# Patient Record
Sex: Male | Born: 1965
Health system: Southern US, Community
[De-identification: ages and names within clinical notes are randomized; demographics above are authoritative.]

## PROBLEM LIST (undated history)

## (undated) DIAGNOSIS — R140 Abdominal distension (gaseous): Secondary | ICD-10-CM

## (undated) DIAGNOSIS — Z950 Presence of cardiac pacemaker: Secondary | ICD-10-CM

## (undated) DIAGNOSIS — M25471 Effusion, right ankle: Secondary | ICD-10-CM

## (undated) DIAGNOSIS — Z95810 Presence of automatic (implantable) cardiac defibrillator: Secondary | ICD-10-CM

## (undated) DIAGNOSIS — I509 Heart failure, unspecified: Secondary | ICD-10-CM

## (undated) DIAGNOSIS — Z9289 Personal history of other medical treatment: Secondary | ICD-10-CM

## (undated) DIAGNOSIS — M25472 Effusion, left ankle: Secondary | ICD-10-CM

## (undated) DIAGNOSIS — K7460 Unspecified cirrhosis of liver: Secondary | ICD-10-CM

## (undated) DIAGNOSIS — I428 Other cardiomyopathies: Secondary | ICD-10-CM

## (undated) HISTORY — DX: Other cardiomyopathies: I42.8

## (undated) HISTORY — PX: APPENDECTOMY: SHX54

## (undated) HISTORY — PX: HEART TRANSPLANT: SHX268

## (undated) HISTORY — DX: Personal history of other medical treatment: Z92.89

## (undated) HISTORY — DX: Presence of automatic (implantable) cardiac defibrillator: Z95.810

## (undated) HISTORY — PX: OTHER SURGICAL HISTORY: SHX169

## (undated) HISTORY — PX: CHOLECYSTECTOMY: SHX55

---

## 2000-04-09 HISTORY — PX: CARDIAC CATHETERIZATION: SHX172

## 2000-05-15 ENCOUNTER — Inpatient Hospital Stay (HOSPITAL_COMMUNITY): Admission: EM | Admit: 2000-05-15 | Discharge: 2000-05-24 | Payer: Self-pay | Admitting: *Deleted

## 2000-05-15 ENCOUNTER — Encounter: Payer: Self-pay | Admitting: Cardiovascular Disease

## 2000-05-17 ENCOUNTER — Encounter: Payer: Self-pay | Admitting: Internal Medicine

## 2000-05-19 ENCOUNTER — Encounter: Payer: Self-pay | Admitting: Cardiovascular Disease

## 2000-05-20 ENCOUNTER — Encounter: Payer: Self-pay | Admitting: Cardiovascular Disease

## 2007-08-26 DIAGNOSIS — Z95810 Presence of automatic (implantable) cardiac defibrillator: Secondary | ICD-10-CM

## 2007-08-26 HISTORY — PX: PACEMAKER INSERTION: SHX728

## 2007-08-26 HISTORY — DX: Presence of automatic (implantable) cardiac defibrillator: Z95.810

## 2012-01-26 ENCOUNTER — Encounter (HOSPITAL_COMMUNITY): Payer: Self-pay

## 2012-01-26 ENCOUNTER — Emergency Department (HOSPITAL_COMMUNITY)
Admission: EM | Admit: 2012-01-26 | Discharge: 2012-01-26 | Disposition: A | Discharge status: Home / Self Care | Payer: BC Managed Care – PPO | Attending: Emergency Medicine | Admitting: Emergency Medicine

## 2012-01-26 DIAGNOSIS — M79609 Pain in unspecified limb: Secondary | ICD-10-CM | POA: Insufficient documentation

## 2012-01-26 DIAGNOSIS — M549 Dorsalgia, unspecified: Secondary | ICD-10-CM | POA: Insufficient documentation

## 2012-01-26 DIAGNOSIS — M25529 Pain in unspecified elbow: Secondary | ICD-10-CM | POA: Insufficient documentation

## 2012-01-26 DIAGNOSIS — M25519 Pain in unspecified shoulder: Secondary | ICD-10-CM | POA: Insufficient documentation

## 2012-01-26 DIAGNOSIS — M79639 Pain in unspecified forearm: Secondary | ICD-10-CM

## 2012-01-26 DIAGNOSIS — M542 Cervicalgia: Secondary | ICD-10-CM | POA: Insufficient documentation

## 2012-01-26 DIAGNOSIS — M25511 Pain in right shoulder: Secondary | ICD-10-CM

## 2012-01-26 MED ORDER — PREDNISONE 10 MG PO TABS: ORAL_TABLET | ORAL | Status: DC

## 2012-01-26 MED ORDER — HYDROCODONE-ACETAMINOPHEN 7.5-325 MG PO TABS: 1.0000 | ORAL_TABLET | ORAL | Status: DC | PRN

## 2012-01-26 MED ORDER — HYDROCODONE-ACETAMINOPHEN 5-325 MG PO TABS
2.0000 | ORAL_TABLET | Freq: Once | ORAL | Status: AC
  Administered 2012-01-26: 2 via ORAL
  Filled 2012-01-26: qty 2

## 2012-01-26 MED ORDER — DIAZEPAM 5 MG PO TABS
10.0000 mg | ORAL_TABLET | Freq: Once | ORAL | Status: AC
  Administered 2012-01-26: 10 mg via ORAL
  Filled 2012-01-26: qty 2

## 2012-01-26 MED ORDER — PREDNISONE 20 MG PO TABS
60.0000 mg | ORAL_TABLET | Freq: Once | ORAL | Status: AC
  Administered 2012-01-26: 60 mg via ORAL
  Filled 2012-01-26: qty 3

## 2012-01-26 MED ORDER — ONDANSETRON HCL 4 MG PO TABS
4.0000 mg | ORAL_TABLET | Freq: Once | ORAL | Status: AC
  Administered 2012-01-26: 4 mg via ORAL
  Filled 2012-01-26: qty 1

## 2012-01-26 MED ORDER — DIAZEPAM 5 MG PO TABS: ORAL_TABLET | ORAL | Status: DC

## 2012-01-26 NOTE — ED Notes (Signed)
Pt reports having neck/back pain x4 days, more severe now has been seeing local chiropractor for treatment.  Did not see any improvement in pain. Toothache like pain in right shoulder /arm, feels pain is worse when sitting or standing.

## 2012-01-26 NOTE — ED Provider Notes (Signed)
History     CSN: [id number]  Arrival date & time 01/26/12  63   First MD Initiated Contact with Patient 01/26/12 1641      Chief Complaint  Patient presents with  . Neck Pain  . Back Pain    (Consider location/radiation/quality/duration/timing/severity/associated sxs/prior treatment) HPI Comments: Patient states he has had problems with pain in his neck for the last 4 days. It is becoming more and more severe radiating into his right shoulder. It is also of note that over last 3-4 months the patient is been having problems with pain in his forearm on the right. He was told by an orthopedic surgeon that he needed to have surgery on one of the nerves in his wrist. The patient has been seeing a chiropractic physician recently he has had 3-4 days of therapy but is not getting any better and actually feels that he is getting worse. The patient presents at this time for assistance with his pain and for evaluation of his neck back and forearm.  Patient is a 46 y.o. male presenting with neck pain and back pain. The history is provided by the patient.  Neck Pain  This is a chronic problem. The problem occurs daily. The problem has been gradually worsening. The pain is associated with an unknown factor. There has been no fever. The pain is present in the generalized neck. The quality of the pain is described as shooting and aching. Radiates to: lower back. The pain is severe. The symptoms are aggravated by position. The pain is the same all the time. Pertinent negatives include no photophobia, no chest pain, no numbness, no bowel incontinence and no bladder incontinence. He has tried nothing for the symptoms.  Back Pain  Pertinent negatives include no chest pain, no numbness, no abdominal pain, no bowel incontinence, no bladder incontinence and no dysuria.    History reviewed. No pertinent past medical history.  Past Surgical History  Procedure Date  . Appendectomy   . Pacemaker insertion      No family history on file.  History  Substance Use Topics  . Smoking status: Never Smoker   . Smokeless tobacco: Not on file  . Alcohol Use: Yes      Review of Systems  Constitutional: Negative for activity change.       All ROS Neg except as noted in HPI  HENT: Positive for neck pain. Negative for nosebleeds.   Eyes: Negative for photophobia and discharge.  Respiratory: Negative for cough, shortness of breath and wheezing.   Cardiovascular: Negative for chest pain and palpitations.  Gastrointestinal: Negative for abdominal pain, blood in stool and bowel incontinence.  Genitourinary: Negative for bladder incontinence, dysuria, frequency and hematuria.  Musculoskeletal: Positive for back pain. Negative for arthralgias.  Skin: Negative.   Neurological: Negative for dizziness, seizures, speech difficulty and numbness.  Psychiatric/Behavioral: Negative for hallucinations and confusion.    Allergies  Review of patient's allergies indicates no known allergies.  Home Medications  No current outpatient prescriptions on file.  BP 119/87  Pulse 76  Temp 97.8 F (36.6 C) (Oral)  Resp 20  Ht 5\' 11"  (1.803 m)  Wt 208 lb (94.348 kg)  BMI 29.01 kg/m2  SpO2 100%  Physical Exam  Nursing note and vitals reviewed. Constitutional: He is oriented to person, place, and time. He appears well-developed and well-nourished.  Non-toxic appearance.  HENT:  Head: Normocephalic.  Right Ear: Tympanic membrane and external ear normal.  Left Ear: Tympanic membrane and external ear  normal.  Eyes: EOM and lids are normal. Pupils are equal, round, and reactive to light.  Neck: Normal range of motion. Neck supple. Carotid bruit is not present.       No carotid bruits appreciated.  Cardiovascular: Normal rate, regular rhythm, normal heart sounds, intact distal pulses and normal pulses.   Pulmonary/Chest: Breath sounds normal. No respiratory distress.  Abdominal: Soft. Bowel sounds are normal.  There is no tenderness. There is no guarding.  Musculoskeletal: Normal range of motion.       There is pain at the superior portion of the trapezius with attempted range of motion of the right shoulder. There is pain with resistance to abduction and abduction of the right shoulder.  There is pain to palpation of the right elbow and extensor tendon area on the right. There no hot areas. There is no palpable deformity of the forearm or elbow.  Lymphadenopathy:       Head (right side): No submandibular adenopathy present.       Head (left side): No submandibular adenopathy present.    He has no cervical adenopathy.  Neurological: He is alert and oriented to person, place, and time. He has normal strength. No cranial nerve deficit or sensory deficit.       Grip is symmetrical. Gait is steady.  Skin: Skin is warm and dry.  Psychiatric: He has a normal mood and affect. His speech is normal.    ED Course  Procedures (including critical care time)  Labs Reviewed - No data to display No results found.   No diagnosis found.    MDM  I have reviewed nursing notes, vital signs, and all appropriate lab and imaging results for this patient. Patient is scheduled to see a [facility] surgeon on Monday, October 21 for evaluation concerning the neck shoulder and upper back area pain. He will also discussed the pain in the right forearm. The patient presents tonight to get assistance with his pain until he can see the physician on Monday. No gross neurologic deficits appreciated at this time. Vital signs reviewed and are stable. The patient is treated with Valium 3 times daily for spasm, Norco 7.5 mg one every 4 hours if needed for pain. Patient is also treated with prednisone 10 mg taper. Patient and the patient's spouse invited to return to the emergency department if any changes, problems, or concerns.       Lenox Ahr, [place] 01/26/12 1727

## 2012-01-27 NOTE — ED Provider Notes (Signed)
Medical screening examination/treatment/procedure(s) were performed by non-physician practitioner and as supervising physician I was immediately available for consultation/collaboration. Rolland Porter, MD, FACEP   Janice Norrie, MD 01/27/12 [phone]

## 2012-03-05 ENCOUNTER — Emergency Department (HOSPITAL_COMMUNITY): Payer: BC Managed Care – PPO

## 2012-03-05 ENCOUNTER — Emergency Department (HOSPITAL_COMMUNITY)
Admission: EM | Admit: 2012-03-05 | Discharge: 2012-03-05 | Disposition: A | Discharge status: Home / Self Care | Payer: BC Managed Care – PPO | Attending: Emergency Medicine | Admitting: Emergency Medicine

## 2012-03-05 ENCOUNTER — Encounter (HOSPITAL_COMMUNITY): Payer: Self-pay | Admitting: Emergency Medicine

## 2012-03-05 DIAGNOSIS — K5900 Constipation, unspecified: Secondary | ICD-10-CM

## 2012-03-05 DIAGNOSIS — N200 Calculus of kidney: Secondary | ICD-10-CM | POA: Insufficient documentation

## 2012-03-05 DIAGNOSIS — Z79899 Other long term (current) drug therapy: Secondary | ICD-10-CM | POA: Insufficient documentation

## 2012-03-05 DIAGNOSIS — Z7982 Long term (current) use of aspirin: Secondary | ICD-10-CM | POA: Insufficient documentation

## 2012-03-05 DIAGNOSIS — Z9089 Acquired absence of other organs: Secondary | ICD-10-CM | POA: Insufficient documentation

## 2012-03-05 MED ORDER — KETOROLAC TROMETHAMINE 30 MG/ML IJ SOLN
30.0000 mg | Freq: Once | INTRAMUSCULAR | Status: AC
  Administered 2012-03-05: 30 mg via INTRAVENOUS
  Filled 2012-03-05: qty 1

## 2012-03-05 NOTE — ED Notes (Signed)
Bed:WA11<BR> Expected date:<BR> Expected time:<BR> Means of arrival:<BR> Comments:<BR>

## 2012-03-05 NOTE — ED Notes (Signed)
Per [facility]-pt being transferred from [facility] with c/o of right flank pain. Hx of kidney problems. States that MD at [facility] told him he had a kidney stone. Toradol given before transport. NAD at this time.

## 2012-03-05 NOTE — ED Notes (Signed)
Pt states that he was given an enema before he left [facility] facility, bedside commode in room.

## 2012-03-05 NOTE — ED Provider Notes (Signed)
History     CSN: [id number]  Arrival date & time 03/05/12  [id number]   First MD Initiated Contact with Patient 03/05/12 1906      Chief Complaint  Patient presents with  . Flank Pain    (Consider location/radiation/quality/duration/timing/severity/associated sxs/prior treatment) HPI  45 y.o. Male with dilated cardiomyopathy with ef of 25% with a kidney stone diagnosed at [facility] on Saturday.  Patient followed up with urologist for left 5 cm upj stone which reportedly had not moved since Saturday.  Patient sent to ed at [facility] and given toradol and an enema as he had not had a bowel movement since taking pain meds.  Patient transferred here to be seen by urology.  Patient had large bowel movement after arriving here.  Pain resolved at [facility] after toradol and bowel movement.    History reviewed. No pertinent past medical history.  Past Surgical History  Procedure Date  . Appendectomy   . Pacemaker insertion     No family history on file.  History  Substance Use Topics  . Smoking status: Never Smoker   . Smokeless tobacco: Not on file  . Alcohol Use: Yes      Review of Systems  Constitutional: Negative for fever and chills.  HENT: Negative for neck stiffness.   Eyes: Negative for visual disturbance.  Respiratory: Negative for shortness of breath.   Cardiovascular: Negative for chest pain.  Gastrointestinal: Positive for constipation. Negative for vomiting, diarrhea and blood in stool.  Genitourinary: Positive for flank pain. Negative for dysuria, frequency and decreased urine volume.  Musculoskeletal: Negative for myalgias and joint swelling.  Skin: Negative for rash.  Neurological: Negative for weakness.  Hematological: Negative for adenopathy.  Psychiatric/Behavioral: Negative for agitation.    Allergies  Penicillins and Sulfa antibiotics  Home Medications   Current Outpatient Rx  Name  Route  Sig  Dispense  Refill  . ASPIRIN EC 81 MG PO TBEC   Oral  Take 81 mg by mouth daily.         Marland Kitchen CARVEDILOL 12.5 MG PO TABS   Oral   Take 12.5 mg by mouth 2 (two) times daily with a meal.         . CETIRIZINE HCL 10 MG PO TABS   Oral   Take 10 mg by mouth daily.         . COQ-10 200 MG PO CAPS   Oral   Take 1 capsule by mouth daily.         Marland Kitchen DIGOXIN 0.125 MG PO TABS   Oral   Take 0.125 mg by mouth daily.         . OMEGA-3 FATTY ACIDS 1000 MG PO CAPS   Oral   Take 1-2 g by mouth daily. 2 morning, 1 at night         . FLUTICASONE-SALMETEROL 100-50 MCG/DOSE IN AEPB   Inhalation   Inhale 1 puff into the lungs every 12 (twelve) hours.         . FUROSEMIDE 20 MG PO TABS   Oral   Take 20 mg by mouth daily.         Marland Kitchen LOSARTAN POTASSIUM 50 MG PO TABS   Oral   Take 50 mg by mouth daily.         Marland Kitchen OMEPRAZOLE 20 MG PO CPDR   Oral   Take 20 mg by mouth daily.         Marland Kitchen ZAFIRLUKAST 20 MG PO TABS  Oral   Take 20 mg by mouth at bedtime.           BP 121/75  Pulse 79  Temp 99.7 F (37.6 C) (Oral)  Resp 20  SpO2 100%  Physical Exam  Nursing note and vitals reviewed. Constitutional: He is oriented to person, place, and time. He appears well-developed and well-nourished.  HENT:  Head: Normocephalic and atraumatic.  Right Ear: External ear normal.  Left Ear: External ear normal.  Nose: Nose normal.  Mouth/Throat: Oropharynx is clear and moist.  Eyes: Conjunctivae normal and EOM are normal. Pupils are equal, round, and reactive to light.  Neck: Normal range of motion. Neck supple.  Cardiovascular: Normal rate, regular rhythm, normal heart sounds and intact distal pulses.   Pulmonary/Chest: Effort normal and breath sounds normal. No respiratory distress. He has no wheezes. He exhibits no tenderness.  Abdominal: Soft. Bowel sounds are normal. He exhibits no distension and no mass. There is no tenderness. There is no guarding.  Musculoskeletal: Normal range of motion.  Neurological: He is alert and oriented to  person, place, and time. He has normal reflexes. He exhibits normal muscle tone. Coordination normal.  Skin: Skin is warm and dry.  Psychiatric: He has a normal mood and affect. His behavior is normal. Judgment and thought content normal.    ED Course  Procedures (including critical care time)  Labs Reviewed - No data to display No results found.   No diagnosis found.    MDM  Plan repeat kub as patient now pain free Discussed with Dr. Alinda Money and he does not think patient would be candidate for intervention if pain free.  Patient continues pain free but 5 mm stone at right upj without movement.  Patient to be discharged with instructions to return if pain not controlled and to have bowel management done proactively.  He and his wife voice understanding of plan.        Shaune Pollack, MD 03/05/12 2209

## 2012-09-02 ENCOUNTER — Other Ambulatory Visit: Payer: Self-pay | Admitting: *Deleted

## 2012-09-02 MED ORDER — CARVEDILOL 12.5 MG PO TABS: 12.5000 mg | ORAL_TABLET | Freq: Two times a day (BID) | ORAL | Status: DC

## 2012-09-02 NOTE — Telephone Encounter (Signed)
RX printed out for Dr Rollene Fare

## 2012-09-04 ENCOUNTER — Telehealth: Payer: Self-pay | Admitting: Cardiovascular Disease

## 2012-09-04 MED ORDER — CARVEDILOL 12.5 MG PO TABS: 12.5000 mg | ORAL_TABLET | Freq: Two times a day (BID) | ORAL | Status: DC

## 2012-09-04 NOTE — Telephone Encounter (Signed)
Mr. Fortuno is calling because he said that [facility] has faxed over a request to get his medication refilled and it has been a week and they have not heard anything as of yet....its for his Carvedilol.12.5mg  tablets.Suzie Portela.[id number].Marland Kitchen And he has no more at this time    Thanks

## 2012-09-04 NOTE — Telephone Encounter (Signed)
Refills resent to pharmacy

## 2012-09-25 ENCOUNTER — Encounter: Payer: Self-pay | Admitting: Cardiovascular Disease

## 2012-09-25 ENCOUNTER — Other Ambulatory Visit (HOSPITAL_COMMUNITY): Payer: Self-pay | Admitting: Cardiovascular Disease

## 2012-09-25 DIAGNOSIS — I255 Ischemic cardiomyopathy: Secondary | ICD-10-CM

## 2012-10-16 ENCOUNTER — Ambulatory Visit (HOSPITAL_COMMUNITY): Payer: Medicare Other | Attending: Cardiovascular Disease

## 2012-10-20 ENCOUNTER — Other Ambulatory Visit: Payer: Self-pay | Admitting: *Deleted

## 2012-10-20 DIAGNOSIS — I428 Other cardiomyopathies: Secondary | ICD-10-CM

## 2012-10-24 DIAGNOSIS — I428 Other cardiomyopathies: Secondary | ICD-10-CM

## 2012-11-18 ENCOUNTER — Encounter: Payer: Self-pay | Admitting: *Deleted

## 2013-01-12 ENCOUNTER — Ambulatory Visit: Payer: BC Managed Care – PPO | Admitting: Cardiovascular Disease

## 2013-02-09 ENCOUNTER — Ambulatory Visit: Payer: BC Managed Care – PPO | Admitting: Cardiovascular Disease

## 2013-02-20 ENCOUNTER — Encounter: Payer: Self-pay | Admitting: Cardiovascular Disease

## 2013-02-20 ENCOUNTER — Ambulatory Visit (INDEPENDENT_AMBULATORY_CARE_PROVIDER_SITE_OTHER): Payer: Medicare Other | Admitting: Cardiovascular Disease

## 2013-02-20 VITALS — BP 124/82 | HR 59 | Ht 71.0 in | Wt 208.0 lb

## 2013-02-20 DIAGNOSIS — I428 Other cardiomyopathies: Secondary | ICD-10-CM

## 2013-02-20 NOTE — Progress Notes (Signed)
02/20/2013 Patrick Humphrey   January 07, 1966  [id number]  Primary Physician No primary provider on file. Primary Cardiologist: Lorretta Harp MD Renae Gloss   HPI: The patient is a delightful, 47 year old, mildly overweight, married, Caucasian male, father of 2, grandfather to 2 grandchildren, who has a history of a nonischemic cardiomyopathy by myself by cath in 2002. 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 He underwent ICD placement by Dr. Tomie China back in 2009 for primary prevention, that was followed by Dr. Elisabeth Cara here in our office until his departure, and currently by Dr. Rollene Fare. He has had 25 episodes of what appears to be an SVT of unclear etiology. Because it was a unipolar ventricular lead, it is unclear whether this was sinus tachycardia versus SVT. The rates were in the 175 to 188 range, and they were all at a minute or less. It is hard to determine whether these were exercise related or not. He also had several episodes of nonsustained VT, the longest one being 12 beats, which self terminated. He was completely unaware of any of these episodes.     Current Outpatient Prescriptions  Medication Sig Dispense Refill  . aspirin EC 81 MG tablet Take 81 mg by mouth daily.      . carvedilol (COREG) 12.5 MG tablet Take 12.5 mg by mouth. One tablet in the morning and 1/2 tablet at night      . cetirizine (ZYRTEC) 10 MG tablet Take 10 mg by mouth daily.      . Coenzyme Q10 (COQ-10) 200 MG CAPS Take 1 capsule by mouth daily.      . digoxin (LANOXIN) 0.125 MG tablet Take 0.125 mg by mouth daily.      . fish oil-omega-3 fatty acids 1000 MG capsule Take 1-2 g by mouth daily. 2 morning, 1 at night      . Fluticasone-Salmeterol (ADVAIR) 100-50 MCG/DOSE AEPB Inhale 1 puff into the lungs every 12 (twelve) hours.      . furosemide (LASIX) 20 MG tablet Take 20 mg by mouth daily.      Marland Kitchen losartan (COZAAR) 50 MG tablet Take 50 mg by mouth daily.      .  montelukast (SINGULAIR) 10 MG tablet Take 10 mg by mouth at bedtime.      Marland Kitchen omeprazole (PRILOSEC) 20 MG capsule Take 20 mg by mouth daily.       No current facility-administered medications for this visit.    Allergies  Allergen Reactions  . Penicillins   . Sulfa Antibiotics     History   Social History  . Marital Status: Married    Spouse Name: N/A    Number of Children: N/A  . Years of Education: N/A   Occupational History  . Not on file.   Social History Main Topics  . Smoking status: Former Smoker    Quit date: 02/20/1998  . Smokeless tobacco: Not on file  . Alcohol Use: Yes  . Drug Use: No  . Sexual Activity: Yes   Other Topics Concern  . Not on file   Social History Narrative  . No narrative on file     Review of Systems: General: negative for chills, fever, night sweats or weight changes.  Cardiovascular: negative for chest pain, dyspnea on exertion, edema, orthopnea, palpitations, paroxysmal nocturnal dyspnea or shortness of breath Dermatological: negative for rash Respiratory: negative for cough or wheezing Urologic: negative for hematuria Abdominal: negative for nausea, vomiting, diarrhea,  bright red blood per rectum, melena, or hematemesis Neurologic: negative for visual changes, syncope, or dizziness All other systems reviewed and are otherwise negative except as noted above.    Blood pressure 124/82, pulse 59, height 5\' 11"  (1.803 m), weight 208 lb (94.348 kg).  General appearance: alert and no distress Neck: no adenopathy, no carotid bruit, no JVD, supple, symmetrical, trachea midline and thyroid not enlarged, symmetric, no tenderness/mass/nodules Lungs: clear to auscultation bilaterally Heart: regular rate and rhythm, S1, S2 normal, no murmur, click, rub or gallop Extremities: extremities normal, atraumatic, no cyanosis or edema  EKG sinus bradycardia at 59 with nonspecific ST and T-wave changes  ASSESSMENT AND PLAN:   Nonischemic  cardiomyopathy The patient has a long history of nonischemic myopathy dating back to 2002 when I performed cardiac catheterization on him revealing severe LV dysfunction with normal coronary arteries. He had an ICD placed by Dr. Tomie China in 2009 for primary prevention which has since been followed by Dr. Rollene Fare. He has had no discharges that he is aware of. He denies chest pain or shortness of breath.      Lorretta Harp MD FACP,FACC,FAHA, [facility] 02/20/2013 4:00 PM

## 2013-02-20 NOTE — Assessment & Plan Note (Signed)
The patient has a long history of nonischemic myopathy dating back to 2002 when I performed cardiac catheterization on him revealing severe LV dysfunction with normal coronary arteries. He had an ICD placed by Dr. Tomie China in 2009 for primary prevention which has since been followed by Dr. Rollene Fare. He has had no discharges that he is aware of. He denies chest pain or shortness of breath.

## 2013-02-20 NOTE — Patient Instructions (Signed)
  Your physician wants you to follow-up with him in : 1 year with Dr Gwenlyn Found                                            and with Dr Sallyanne Kuster in June 2015                    You will receive a reminder letter in the mail one month in advance. If you don't receive a letter, please call our office to schedule the follow-up appointment.     Your physician has ordered the following tests: echocardiogram

## 2013-03-20 ENCOUNTER — Telehealth: Payer: Self-pay | Admitting: *Deleted

## 2013-03-20 NOTE — Telephone Encounter (Signed)
Dr Gwenlyn Found reviewed echocardiogram from [facility] that was done in July 2014.  We do not need to repeat an echocardiogram at this time.  Dr Gwenlyn Found ordered to cancel the echo scheduled for next week.  Patient is aware.

## 2013-03-25 ENCOUNTER — Ambulatory Visit (HOSPITAL_COMMUNITY): Payer: BC Managed Care – PPO

## 2013-04-03 ENCOUNTER — Encounter: Payer: Self-pay | Admitting: *Deleted

## 2013-08-13 ENCOUNTER — Encounter: Payer: Self-pay | Admitting: *Deleted

## 2013-08-19 ENCOUNTER — Encounter: Payer: BC Managed Care – PPO | Admitting: Cardiovascular Disease

## 2013-09-26 ENCOUNTER — Other Ambulatory Visit: Payer: Self-pay | Admitting: Cardiovascular Disease

## 2013-09-28 NOTE — Telephone Encounter (Signed)
Rx was sent to pharmacy electronically. 

## 2013-10-20 ENCOUNTER — Encounter: Payer: Self-pay | Admitting: Cardiovascular Disease

## 2013-10-20 ENCOUNTER — Ambulatory Visit (INDEPENDENT_AMBULATORY_CARE_PROVIDER_SITE_OTHER): Payer: BC Managed Care – PPO | Admitting: Cardiovascular Disease

## 2013-10-20 VITALS — BP 102/70 | HR 66 | Resp 16 | Ht 71.0 in | Wt 201.2 lb

## 2013-10-20 DIAGNOSIS — Z95810 Presence of automatic (implantable) cardiac defibrillator: Secondary | ICD-10-CM

## 2013-10-20 DIAGNOSIS — I428 Other cardiomyopathies: Secondary | ICD-10-CM

## 2013-10-20 NOTE — Patient Instructions (Signed)
Remote monitoring is used to monitor your ICD from home. This monitoring reduces the number of office visits required to check your device to one time per year. It allows [place] to keep an eye on the functioning of your device to ensure it is working properly. You are scheduled for a device check from home on 01-21-2014. You may send your transmission at any time that day. If you have a wireless device, the transmission will be sent automatically. After your physician reviews your transmission, you will receive a postcard with your next transmission date.  Your physician recommends that you schedule a follow-up appointment in: 12 months with Dr.Croitoru

## 2013-10-21 ENCOUNTER — Encounter: Payer: Self-pay | Admitting: Cardiovascular Disease

## 2013-10-21 DIAGNOSIS — Z95810 Presence of automatic (implantable) cardiac defibrillator: Secondary | ICD-10-CM | POA: Insufficient documentation

## 2013-10-21 LAB — MDC_IDC_ENUM_SESS_TYPE_INCLINIC
Battery Voltage: 2.95 V
Brady Statistic RV Percent Paced: 0.13 %
Date Time Interrogation Session: 20150714150932
HighPow Impedance: 56 Ohm
HighPow Impedance: 74 Ohm
Lead Channel Impedance Value: 646 Ohm
Lead Channel Pacing Threshold Amplitude: 0.75 V
Lead Channel Pacing Threshold Pulse Width: 0.4 ms
Lead Channel Sensing Intrinsic Amplitude: 6.625 mV
Lead Channel Sensing Intrinsic Amplitude: 8 mV
Lead Channel Setting Pacing Amplitude: 2.5 V
Lead Channel Setting Pacing Pulse Width: 0.4 ms
Lead Channel Setting Sensing Sensitivity: 0.3 mV
Zone Setting Detection Interval: 290 ms
Zone Setting Detection Interval: 330 ms
Zone Setting Detection Interval: 360 ms

## 2013-10-21 NOTE — Progress Notes (Signed)
Patient ID: Patrick Humphrey, male   DOB: [birth year], 48 y.o.   MRN: [id number]      Reason for office visit ICD followup, nonischemic cardiomyopathy  This is my first encounter face-to-face with Patrick Humphrey, although I have seen several of his remote defibrillator downloads. He has a long-standing history of nonischemic cardiomyopathy dating back at least 13 years. 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 In 2009 he received a single-chamber Medtronic Maximo II VR defibrillator, which has never delivered therapy for any clinical events. Intermittently his device has recorded high ventricular rates, including 3 events on the current evaluation.  Two of these episodes clearly represents sinus tachycardia with a very gradual increase in heart rate and a gradual decrement. He is physically quite active (patient activity log roughly 5 hours per day, consistent over the last year). The other episode shows abrupt onset and abrupt termination, 180 bpm, but the intracardiac electrogram morphology is identical with sinus rhythm, consistent with supraventricular tachycardia. There are no convincing episodes of VT on today's interrogation. Lead parameters are excellent. Battery generator voltage is 2.95 mV (ERI at 2.63 V). His device does not provide monitoring of the thoracic impedance.  He has NYHA functional class I status and has no cardiovascular complaints whatsoever.   Allergies  Allergen Reactions  . Penicillins   . Sulfa Antibiotics     Current Outpatient Prescriptions  Medication Sig Dispense Refill  . aspirin EC 81 MG tablet Take 81 mg by mouth daily.      . carvedilol (COREG) 12.5 MG tablet Take 1 tablet (12.5 mg total) by mouth every morning and take 0.5 tablet (6.25 mg total) by mouth every evening.  45 tablet  5  . cetirizine (ZYRTEC) 10 MG tablet Take 10 mg by mouth daily.      . Coenzyme Q10 (COQ-10) 200 MG CAPS Take 1 capsule by  mouth daily.      . digoxin (LANOXIN) 0.125 MG tablet Take 0.125 mg by mouth daily.      . fish oil-omega-3 fatty acids 1000 MG capsule Take 1-2 g by mouth daily. 2 morning, 1 at night      . Fluticasone-Salmeterol (ADVAIR) 100-50 MCG/DOSE AEPB Inhale 1 puff into the lungs 2 (two) times daily as needed.       . furosemide (LASIX) 20 MG tablet Take 20 mg by mouth daily.      Marland Kitchen losartan (COZAAR) 50 MG tablet Take 50 mg by mouth daily.      . montelukast (SINGULAIR) 10 MG tablet Take 10 mg by mouth at bedtime.      Marland Kitchen omeprazole (PRILOSEC) 20 MG capsule Take 20 mg by mouth daily as needed.        No current facility-administered medications for this visit.    Past Medical History  Diagnosis Date  . Nonischemic cardiomyopathy   . ICD (implantable cardioverter-defibrillator), single, in situ 08/26/2007  . Hx of echocardiogram 11/09/1910    Showed an EF of 25% with no significant valve disease.    Past Surgical History  Procedure Laterality Date  . Appendectomy    . Pacemaker insertion  08/26/2007    place by Dr. Alroy Dust at [facility]  . Cardiac catheterization  2002    Normal coronary arteries    Family History  Problem Relation Age of Onset  . Hypertension Mother   . Hypertension Father   . Cancer - Prostate Maternal Grandfather  History   Social History  . Marital Status: Married    Spouse Name: N/A    Number of Children: N/A  . Years of Education: N/A   Occupational History  . Not on file.   Social History Main Topics  . Smoking status: Former Smoker    Quit date: 02/20/1998  . Smokeless tobacco: Not on file  . Alcohol Use: Yes  . Drug Use: No  . Sexual Activity: Yes   Other Topics Concern  . Not on file   Social History Narrative  . No narrative on file    Review of systems: The patient specifically denies any chest pain at rest or with exertion, dyspnea at rest or with exertion, orthopnea, paroxysmal nocturnal dyspnea, syncope, palpitations,  focal neurological deficits, intermittent claudication, lower extremity edema, unexplained weight gain, cough, hemoptysis or wheezing.  The patient also denies abdominal pain, nausea, vomiting, dysphagia, diarrhea, constipation, polyuria, polydipsia, dysuria, hematuria, frequency, urgency, abnormal bleeding or bruising, fever, chills, unexpected weight changes, mood swings, change in skin or hair texture, change in voice quality, auditory or visual problems, allergic reactions or rashes, new musculoskeletal complaints other than usual "aches and pains".   PHYSICAL EXAM BP 102/70  Pulse 66  Resp 16  Ht 5\' 11"  (1.803 m)  Wt 201 lb 3.2 oz (91.264 kg)  BMI 28.07 kg/m2  General: Alert, oriented x3, no distress Head: no evidence of trauma, PERRL, EOMI, no exophtalmos or lid lag, no myxedema, no xanthelasma; normal ears, nose and oropharynx Neck: normal jugular venous pulsations and no hepatojugular reflux; brisk carotid pulses without delay and no carotid bruits Chest: clear to auscultation, no signs of consolidation by percussion or palpation, normal fremitus, symmetrical and full respiratory excursions, healthy subclavian ICD site Cardiovascular: normal position and quality of the apical impulse, regular rhythm, normal first and second heart sounds, no murmurs, rubs or gallops Abdomen: no tenderness or distention, no masses by palpation, no abnormal pulsatility or arterial bruits, normal bowel sounds, no hepatosplenomegaly Extremities: no clubbing, cyanosis or edema; 2+ radial, ulnar and brachial pulses bilaterally; 2+ right femoral, posterior tibial and dorsalis pedis pulses; 2+ left femoral, posterior tibial and dorsalis pedis pulses; no subclavian or femoral bruits Neurological: grossly nonfocal   EKG: Sinus rhythm incomplete left bundle branch block nonspecific ST-T-segment changes; a single PVC is noted  ASSESSMENT AND PLAN  Normally functioning single-chamber defibrillator in a patient  with nonischemic cardiomyopathy and excellent functional status. A few detected events were appropriately identified by his device has representing supraventricular tachycardia. No changes were made to his device settings today. His device is programmed as a single zone "shock box" for rates over 207 beats per minute. He does not require backup pacing (programmed VVI at 40 beats per minute). He is an excellent candidate for remote pacemaker monitoring every 3 months. [facility]Link and yearly in-office checks.  Orders Placed This Encounter  Procedures  . Implantable device check  . EKG 12-Lead   No orders of the defined types were placed in this encounter.    Holli Humbles, MD, [place] [phone] office [phone] pager

## 2014-01-18 ENCOUNTER — Telehealth: Payer: Self-pay | Admitting: Cardiovascular Disease

## 2014-01-18 NOTE — Telephone Encounter (Signed)
Discussed with kristin pharm md, pt aware the Qvar and advair will help with those sym[ptoms but are long term. Pt is looking for short acting, explained that most rescue inhalers will contain albuterol. He will cont to use his advair on a more consistent basis during the allergy seasons.

## 2014-01-18 NOTE — Telephone Encounter (Signed)
Pt called in wanting to know if Dr. Gwenlyn Found could recommend an asthma medication  for seasonal asthma that DOES NOT have albuterol in it. Please call  Thanks

## 2014-01-18 NOTE — Telephone Encounter (Signed)
Pt called in wanting to know how the Remote Defib checks work because he got a call saying that he could call in at anytime for the reading and is use to receiving a call. He is just wanting some clarity on what to do. Please call  Thanks

## 2014-01-18 NOTE — Telephone Encounter (Signed)
Will forward to the device clinic 

## 2014-01-18 NOTE — Telephone Encounter (Signed)
Spoke with pt and informed him that transmission should be automatic. He verbalized understanding.

## 2014-01-21 ENCOUNTER — Encounter: Payer: BC Managed Care – PPO | Admitting: *Deleted

## 2014-01-22 ENCOUNTER — Telehealth: Payer: Self-pay | Admitting: Cardiovascular Disease

## 2014-01-22 NOTE — Telephone Encounter (Signed)
Routed to [employer]

## 2014-01-22 NOTE — Telephone Encounter (Signed)
Spoke with pt and he stated that he has [place] for his phone company. He stated that he does not have a phone filter. I informed him that I would mail him a filter and for him to call back when he is ready to send the transmission he verbalized understanding.

## 2014-01-22 NOTE — Telephone Encounter (Signed)
New Message  Pt called states that he was instructed to send a signal. Has not received a signal. Pt tried to send a signal but he says his remote device is not working. Pt requets a call back to determine if it is ok to go to the [place] office and have it checked. Please call back to discuss

## 2014-02-17 ENCOUNTER — Encounter: Payer: Self-pay | Admitting: Cardiology

## 2014-02-17 ENCOUNTER — Encounter: Payer: Self-pay | Admitting: Cardiovascular Disease

## 2014-03-02 ENCOUNTER — Telehealth: Payer: Self-pay | Admitting: Cardiovascular Disease

## 2014-03-02 NOTE — Telephone Encounter (Signed)
New Msg  Patient would like a call back, did not give details. Please contact at [phone]

## 2014-03-02 NOTE — Telephone Encounter (Signed)
Spoke with pt and informed him that filter will be re mailed today. He verbalized understanding.

## 2014-04-05 ENCOUNTER — Other Ambulatory Visit: Payer: Self-pay | Admitting: Cardiovascular Disease

## 2014-04-05 NOTE — Telephone Encounter (Signed)
Rx refill sent to patient pharmacy   

## 2014-04-26 ENCOUNTER — Telehealth: Payer: Self-pay | Admitting: Cardiology

## 2014-04-26 NOTE — Telephone Encounter (Signed)
Pt called into office and stated that he received filter. When he attached filter to land line phone his monitor would not turn on. Pt agreed to an appt on 1-20 at 4:30 PM to get ICD interrogated and agreed to call tech service to receive help trouble shooting monitor.

## 2014-05-06 ENCOUNTER — Ambulatory Visit (INDEPENDENT_AMBULATORY_CARE_PROVIDER_SITE_OTHER): Payer: Medicare Other | Admitting: *Deleted

## 2014-05-06 DIAGNOSIS — I429 Cardiomyopathy, unspecified: Secondary | ICD-10-CM

## 2014-05-06 DIAGNOSIS — I428 Other cardiomyopathies: Secondary | ICD-10-CM

## 2014-05-06 LAB — MDC_IDC_ENUM_SESS_TYPE_INCLINIC
Battery Voltage: 2.88 V
Brady Statistic RV Percent Paced: 0.06 %
Date Time Interrogation Session: 20160128154440
HighPow Impedance: 51 Ohm
HighPow Impedance: 61 Ohm
Lead Channel Impedance Value: 627 Ohm
Lead Channel Pacing Threshold Amplitude: 0.75 V
Lead Channel Pacing Threshold Pulse Width: 0.4 ms
Lead Channel Sensing Intrinsic Amplitude: 6.875 mV
Lead Channel Sensing Intrinsic Amplitude: 8.625 mV
Lead Channel Setting Pacing Amplitude: 2.5 V
Lead Channel Setting Pacing Pulse Width: 0.4 ms
Lead Channel Setting Sensing Sensitivity: 0.3 mV
Zone Setting Detection Interval: 290 ms
Zone Setting Detection Interval: 330 ms
Zone Setting Detection Interval: 360 ms

## 2014-05-06 NOTE — Progress Notes (Signed)
ICD check in clinic. Normal device function. Thresholds and sensing consistent with previous device measurements. Impedance trends stable over time. No evidence of any ventricular arrhythmias.  Histogram distribution appropriate for patient and level of activity. No changes made this session. Device programmed at appropriate safety margins. Device programmed to optimize intrinsic conduction.  Pt enrolled in remote follow-up. Plan to check device every 3 months remotely and in office annually. Patient education completed including shock plan. Alert tones/vibration demonstrated for patient.  Carelink 08/05/14.

## 2014-05-21 ENCOUNTER — Encounter: Payer: Self-pay | Admitting: Cardiovascular Disease

## 2014-08-05 ENCOUNTER — Ambulatory Visit (INDEPENDENT_AMBULATORY_CARE_PROVIDER_SITE_OTHER): Payer: Medicare Other | Admitting: *Deleted

## 2014-08-05 DIAGNOSIS — I429 Cardiomyopathy, unspecified: Secondary | ICD-10-CM | POA: Diagnosis not present

## 2014-08-05 DIAGNOSIS — I428 Other cardiomyopathies: Secondary | ICD-10-CM

## 2014-08-05 NOTE — Progress Notes (Signed)
Remote ICD transmission.   

## 2014-08-06 LAB — MDC_IDC_ENUM_SESS_TYPE_REMOTE
Battery Voltage: 2.84 V
Brady Statistic RV Percent Paced: 0 %
Date Time Interrogation Session: 20160428174122
HighPow Impedance: 47 Ohm
HighPow Impedance: 56 Ohm
Lead Channel Impedance Value: 551 Ohm
Lead Channel Sensing Intrinsic Amplitude: 9 mV
Lead Channel Setting Pacing Amplitude: 2.5 V
Lead Channel Setting Pacing Pulse Width: 0.4 ms
Lead Channel Setting Sensing Sensitivity: 0.3 mV
Zone Setting Detection Interval: 290 ms
Zone Setting Detection Interval: 330 ms
Zone Setting Detection Interval: 360 ms

## 2014-08-10 ENCOUNTER — Encounter: Payer: Self-pay | Admitting: Cardiology

## 2014-08-11 ENCOUNTER — Telehealth: Payer: Self-pay | Admitting: Cardiovascular Disease

## 2014-08-11 NOTE — Telephone Encounter (Signed)
Informed pt that 08-05-14 transmission was normal.

## 2014-08-11 NOTE — Telephone Encounter (Signed)
New Message  Pt calling to speak w/ Device about remote signal on 4/28. Please call back and sicuss.

## 2014-08-16 ENCOUNTER — Encounter: Payer: Self-pay | Admitting: Cardiovascular Disease

## 2014-10-04 ENCOUNTER — Other Ambulatory Visit: Payer: Self-pay | Admitting: Cardiovascular Disease

## 2014-11-15 ENCOUNTER — Encounter: Payer: Self-pay | Admitting: Cardiovascular Disease

## 2014-11-15 ENCOUNTER — Ambulatory Visit (INDEPENDENT_AMBULATORY_CARE_PROVIDER_SITE_OTHER): Payer: Medicare Other | Admitting: Cardiovascular Disease

## 2014-11-15 VITALS — BP 102/70 | HR 82 | Resp 16 | Ht 71.0 in | Wt 188.9 lb

## 2014-11-15 DIAGNOSIS — Z95810 Presence of automatic (implantable) cardiac defibrillator: Secondary | ICD-10-CM

## 2014-11-15 DIAGNOSIS — I428 Other cardiomyopathies: Secondary | ICD-10-CM

## 2014-11-15 DIAGNOSIS — I429 Cardiomyopathy, unspecified: Secondary | ICD-10-CM | POA: Diagnosis not present

## 2014-11-15 NOTE — Progress Notes (Signed)
Patient ID: Patrick Humphrey, male   DOB: 1965-09-04, 49 y.o.   MRN: [id number]     Cardiology Office Note   Date:  11/15/2014   ID:  Patrick Humphrey, DOB Mar 01, 1966, MRN [id number]  PCP:  Monico Blitz, MD  Cardiologist:   Sanda Klein, MD   Chief Complaint  Patient presents with  . Follow-up      History of Present Illness: Patrick Humphrey is a 49 y.o. male who presents for  Follow-up for nonischemic cardiomyopathy and defibrillator. He continues to feel great and essentially has no cardiac symptoms ( functional status NYHA class I ). He is on treatment with a maximum tolerated dose of carvedilol and losartan as well as a very low dose of furosemide.  He has been taking digoxin for years. He performs regular physical exercise. He has not had problems with edema , exertional dyspnea, palpitations or chest pain. His defibrillator check shows normal device function with an estimated generator longevity of at least another year. Activity level is unchanged at about 4 hours a day. He has not had any episodes of sustained VT and the episodes of nonsustained ventricular tachycardia are barely worth mentioning (15 beat NSVT occurred in mid-July).  He does not require pacing. He has been diagnosed with liver disease and would like somebody to go over all his medications and supplements in detail to make sure there is no conflict there.  I done on much about the details of his liver problems.    Past Medical History  Diagnosis Date  . Nonischemic cardiomyopathy   . ICD (implantable cardioverter-defibrillator), single, in situ 08/26/2007  . Hx of echocardiogram 11/09/1910    Showed an EF of 25% with no significant valve disease.    Past Surgical History  Procedure Laterality Date  . Appendectomy    . Pacemaker insertion  08/26/2007    place by Dr. Alroy Dust at [facility]  . Cardiac catheterization  2002    Normal coronary arteries     Current Outpatient Prescriptions    Medication Sig Dispense Refill  . aspirin EC 81 MG tablet Take 81 mg by mouth daily.    . carvedilol (COREG) 12.5 MG tablet TAKE ONE TABLET BY MOUTH IN THE MORNING AND TAKE ONE-HALF TABLET IN THE EVENING 45 tablet 1  . cetirizine (ZYRTEC) 10 MG tablet Take 10 mg by mouth daily.    . Coenzyme Q10 (COQ-10) 200 MG CAPS Take 1 capsule by mouth daily.    . digoxin (LANOXIN) 0.125 MG tablet Take 0.125 mg by mouth daily.    . fish oil-omega-3 fatty acids 1000 MG capsule Take 1-2 g by mouth daily. 2 morning, 1 at night    . Fluticasone-Salmeterol (ADVAIR) 100-50 MCG/DOSE AEPB Inhale 1 puff into the lungs 2 (two) times daily as needed.     . furosemide (LASIX) 20 MG tablet Take 20 mg by mouth daily.    Marland Kitchen losartan (COZAAR) 50 MG tablet Take 50 mg by mouth daily.    . montelukast (SINGULAIR) 10 MG tablet Take 10 mg by mouth at bedtime.    Marland Kitchen omeprazole (PRILOSEC) 20 MG capsule Take 20 mg by mouth daily as needed.      No current facility-administered medications for this visit.    Allergies:   Penicillins and Sulfa antibiotics    Social History:  The patient  reports that he quit smoking about 16 years ago. He does not have any smokeless tobacco history on file. He reports that he drinks  alcohol. He reports that he does not use illicit drugs.   Family History:  The patient's   family history includes Cancer - Prostate in his maternal grandfather; Hypertension in his father and mother.    ROS:  Please see the history of present illness.    Otherwise, review of systems positive for none.   All other systems are reviewed and negative.    PHYSICAL EXAM: VS:  BP 102/70 mmHg  Pulse 82  Resp 16  Ht 5\' 11"  (1.803 m)  Wt 188 lb 14.4 oz (85.684 kg)  BMI 26.36 kg/m2 , BMI Body mass index is 26.36 kg/(m^2).  General: Alert, oriented x3, no distress Head: no evidence of trauma, PERRL, EOMI, no exophtalmos or lid lag, no myxedema, no xanthelasma; normal ears, nose and oropharynx Neck: normal jugular  venous pulsations and no hepatojugular reflux; brisk carotid pulses without delay and no carotid bruits Chest: clear to auscultation, no signs of consolidation by percussion or palpation, normal fremitus, symmetrical and full respiratory excursions,  Healthy subclavian defibrillator site Cardiovascular: normal position and quality of the apical impulse, regular rhythm, normal first and second heart sounds, no  murmurs, rubs or gallops Abdomen: no tenderness or distention, no masses by palpation, no abnormal pulsatility or arterial bruits, normal bowel sounds, no hepatosplenomegaly Extremities: no clubbing, cyanosis or edema; 2+ radial, ulnar and brachial pulses bilaterally; 2+ right femoral, posterior tibial and dorsalis pedis pulses; 2+ left femoral, posterior tibial and dorsalis pedis pulses; no subclavian or femoral bruits Neurological: grossly nonfocal Psych: euthymic mood, full affect   EKG:  EKG is ordered today. The ekg ordered today demonstrates  Sinus rhythm, left atrial abnormality, incomplete left bundle branch block, QRS 160 ms, QTC 432 ms    Wt Readings from Last 3 Encounters:  11/15/14 188 lb 14.4 oz (85.684 kg)  10/20/13 201 lb 3.2 oz (91.264 kg)  02/20/13 208 lb (94.348 kg)      ASSESSMENT AND PLAN:  1.  Well compensated nonischemic cardiomyopathy, NYHA functional class I, euvolemic on minimal loop diuretic.  I wonder whether he really needs chronic digoxin therapy 2.  Chronic liver disease, no details available his liver specialist has performed labs and we will try to obtain these results. We'll ask our pharmacist, Tommy Medal to go over his prescription medications and supplements in detail. 3.  Normally functioning single chamber defibrillator without any recorded events of meaningful arrhythmia. Continue remote checks every 3 months and office visits on a yearly basis    Current medicines are reviewed at length with the patient today.  The patient has concerns  regarding medicines.  The following changes have been made:  no change  Labs/ tests ordered today include:  Orders Placed This Encounter  Procedures  . EKG 12-Lead   Patient Instructions  Remote monitoring is used to monitor your Pacemaker of ICD from home. This monitoring reduces the number of office visits required to check your device to one time per year. It allows [place] to keep an eye on the functioning of your device to ensure it is working properly. You are scheduled for a device check from home on 3 MONTHS. You may send your transmission at any time that day. If you have a wireless device, the transmission will be sent automatically. After your physician reviews your transmission, you will receive a postcard with your next transmission date.   Your physician wants you to follow-up in: [place] will receive a reminder  letter in the mail two months in advance. If you don't receive a letter, please call our office to schedule the follow-up appointment.   APPOINTMENT WITH KRISTIN TO DISCUSS SUPPLEMENTS-20 MIN APPOINTMENT     Signed, Sanda Klein, MD  11/15/2014 5:49 PM    Sanda Klein, MD, [facility] [phone] office [phone] pager

## 2014-11-15 NOTE — Patient Instructions (Signed)
Remote monitoring is used to monitor your Pacemaker of ICD from home. This monitoring reduces the number of office visits required to check your device to one time per year. It allows [place] to keep an eye on the functioning of your device to ensure it is working properly. You are scheduled for a device check from home on 3 MONTHS. You may send your transmission at any time that day. If you have a wireless device, the transmission will be sent automatically. After your physician reviews your transmission, you will receive a postcard with your next transmission date.   Your physician wants you to follow-up in: [place] will receive a reminder letter in the mail two months in advance. If you don't receive a letter, please call our office to schedule the follow-up appointment.   APPOINTMENT WITH KRISTIN TO DISCUSS SUPPLEMENTS-20 MIN APPOINTMENT

## 2014-11-16 LAB — CUP PACEART INCLINIC DEVICE CHECK
Battery Voltage: 2.76 V
Brady Statistic RV Percent Paced: 0.01 %
Date Time Interrogation Session: 20160808191010
HighPow Impedance: 55 Ohm
HighPow Impedance: 72 Ohm
Lead Channel Impedance Value: 532 Ohm
Lead Channel Sensing Intrinsic Amplitude: 7.75 mV
Lead Channel Sensing Intrinsic Amplitude: 7.75 mV
Lead Channel Setting Pacing Amplitude: 2.5 V
Lead Channel Setting Pacing Pulse Width: 0.4 ms
Lead Channel Setting Sensing Sensitivity: 0.3 mV
Zone Setting Detection Interval: 290 ms
Zone Setting Detection Interval: 330 ms
Zone Setting Detection Interval: 360 ms

## 2014-11-25 ENCOUNTER — Ambulatory Visit: Payer: Medicare Other | Admitting: Pharmacist Clinician (PhC)/ Clinical Pharmacy Specialist

## 2014-11-25 ENCOUNTER — Encounter: Payer: Self-pay | Admitting: Cardiovascular Disease

## 2014-11-30 ENCOUNTER — Other Ambulatory Visit: Payer: Self-pay | Admitting: Cardiovascular Disease

## 2014-12-01 NOTE — Telephone Encounter (Signed)
Rx request sent to pharmacy.  

## 2014-12-07 ENCOUNTER — Telehealth: Payer: Self-pay | Admitting: Cardiovascular Disease

## 2014-12-07 NOTE — Telephone Encounter (Signed)
FORWARD TO DR BERRY  Patient last visit was 11 /2014 with Dr Gwenlyn Found. The year 2015 and 2016 patient has had appointment with Dr Sallyanne Kuster.

## 2014-12-07 NOTE — Telephone Encounter (Signed)
Patient states he has been seeing a specialist because his liver "count" has been up.  He advised the patient to donate blood, this would help his problem.  Patient wants to know if this is ok with Dr. Gwenlyn Found since he is his cardiologist.

## 2014-12-07 NOTE — Telephone Encounter (Signed)
The patient can donate blood

## 2014-12-08 NOTE — Telephone Encounter (Signed)
Patient notified of Dr Kennon Holter recommendations.

## 2015-01-01 ENCOUNTER — Other Ambulatory Visit: Payer: Self-pay | Admitting: Cardiovascular Disease

## 2015-01-17 ENCOUNTER — Telehealth: Payer: Self-pay | Admitting: Cardiovascular Disease

## 2015-01-17 NOTE — Telephone Encounter (Signed)
Pt is going to have a procedure on his esophagus. He wants to know if there are any precautions and advice he needs,because of heart condition.His procedure will be Rosine Door get back to him asap.

## 2015-01-17 NOTE — Telephone Encounter (Signed)
Pt seeing Dr. Doristine Mango for GI issues.  This call is regarding pt's esophageal stricture.  Having a procedure Friday to have this scoped and possible balloon dilation if warranted.  Dr. Britta Mccreedy stated to pt that there should not be any issues regarding clearance for this procedure, but had patient check w [place] to make sure. Stated he will not be "fully under" w/ regards to anesthesia   Explained that Dr. Gwenlyn Found out of office but in hospital this week, will route to him to see if any concerns.

## 2015-01-18 NOTE — Telephone Encounter (Signed)
Should have no problems with endoscopy

## 2015-01-18 NOTE — Telephone Encounter (Signed)
Pt advised on Dr. Kennon Holter recommendations. Understanding verbalized.

## 2015-01-27 ENCOUNTER — Telehealth: Payer: Self-pay | Admitting: Cardiovascular Disease

## 2015-01-27 NOTE — Telephone Encounter (Signed)
Have develop a cough and his pcp seems to think it is coming from his Cosar ( Losartan Potassium) wants to know if he can get on something else .Marland KitchenPlease Call  Thanks

## 2015-01-27 NOTE — Telephone Encounter (Signed)
Pt experiencing dry cough for about a month. Has been r/o for URI symptoms by Dr. Brigitte Pulse, his primary. States cough has been persistent despite treatments. Dr. Brigitte Pulse suspected cozaar might be causing this. Informed pt I was aware of ACE inhibitors causing this but unsure of incidence w/ ARBs - will defer to pharmacy to advise.

## 2015-01-28 MED ORDER — VALSARTAN 40 MG PO TABS: 40.0000 mg | ORAL_TABLET | Freq: Two times a day (BID) | ORAL | Status: DC

## 2015-01-28 NOTE — Telephone Encounter (Signed)
Rx sent to pharm. Instructions relayed to patient, he voiced understanding.

## 2015-01-28 NOTE — Telephone Encounter (Signed)
Losartan can cause cough in some patients, less chance with valsartan.  Have patient switch to valsartan 40 mg bid (heart failure dose).

## 2015-02-11 ENCOUNTER — Encounter (INDEPENDENT_AMBULATORY_CARE_PROVIDER_SITE_OTHER): Payer: Self-pay | Admitting: *Deleted

## 2015-02-14 ENCOUNTER — Telehealth: Payer: Self-pay | Admitting: Cardiology

## 2015-02-14 ENCOUNTER — Ambulatory Visit (INDEPENDENT_AMBULATORY_CARE_PROVIDER_SITE_OTHER): Payer: Medicare Other | Admitting: *Deleted

## 2015-02-14 DIAGNOSIS — I428 Other cardiomyopathies: Secondary | ICD-10-CM

## 2015-02-14 DIAGNOSIS — I429 Cardiomyopathy, unspecified: Secondary | ICD-10-CM | POA: Diagnosis not present

## 2015-02-14 NOTE — Telephone Encounter (Signed)
Spoke with pt and reminded pt of remote transmission that is due today. Pt verbalized understanding.   

## 2015-02-15 NOTE — Progress Notes (Signed)
Remote ICD transmission.   

## 2015-02-16 ENCOUNTER — Encounter: Payer: Self-pay | Admitting: Cardiology

## 2015-02-16 LAB — CUP PACEART REMOTE DEVICE CHECK
Battery Voltage: 2.72 V
Brady Statistic RV Percent Paced: 0 %
Date Time Interrogation Session: 20161108001800
HighPow Impedance: 51 Ohm
HighPow Impedance: 65 Ohm
Implantable Lead Implant Date: 20090519
Implantable Lead Location: 753860
Implantable Lead Model: 7070
Lead Channel Impedance Value: 551 Ohm
Lead Channel Sensing Intrinsic Amplitude: 7 mV
Lead Channel Sensing Intrinsic Amplitude: 7 mV
Lead Channel Setting Pacing Amplitude: 2.5 V
Lead Channel Setting Pacing Pulse Width: 0.4 ms
Lead Channel Setting Sensing Sensitivity: 0.3 mV

## 2015-03-08 ENCOUNTER — Ambulatory Visit: Payer: Medicare Other | Admitting: Cardiovascular Disease

## 2015-03-09 ENCOUNTER — Ambulatory Visit (INDEPENDENT_AMBULATORY_CARE_PROVIDER_SITE_OTHER): Payer: Medicare Other | Admitting: Cardiovascular Disease

## 2015-03-09 ENCOUNTER — Encounter: Payer: Self-pay | Admitting: Cardiovascular Disease

## 2015-03-09 VITALS — BP 82/64 | HR 96 | Ht 71.0 in | Wt 182.0 lb

## 2015-03-09 DIAGNOSIS — I429 Cardiomyopathy, unspecified: Secondary | ICD-10-CM | POA: Diagnosis not present

## 2015-03-09 DIAGNOSIS — I519 Heart disease, unspecified: Secondary | ICD-10-CM

## 2015-03-09 DIAGNOSIS — I428 Other cardiomyopathies: Secondary | ICD-10-CM

## 2015-03-09 DIAGNOSIS — Z95810 Presence of automatic (implantable) cardiac defibrillator: Secondary | ICD-10-CM

## 2015-03-09 DIAGNOSIS — R350 Frequency of micturition: Secondary | ICD-10-CM

## 2015-03-09 LAB — BASIC METABOLIC PANEL
BUN: 19 mg/dL (ref 7–25)
CO2: 30 mmol/L (ref 20–31)
Calcium: 8.9 mg/dL (ref 8.6–10.3)
Chloride: 98 mmol/L (ref 98–110)
Creat: 0.85 mg/dL (ref 0.70–1.33)
Glucose, Bld: 95 mg/dL (ref 65–99)
Potassium: 4.1 mmol/L (ref 3.5–5.3)
Sodium: 138 mmol/L (ref 135–146)

## 2015-03-09 MED ORDER — LOSARTAN POTASSIUM 25 MG PO TABS: 25.0000 mg | ORAL_TABLET | Freq: Every day | ORAL | Status: DC

## 2015-03-09 MED ORDER — FUROSEMIDE 20 MG PO TABS: 40.0000 mg | ORAL_TABLET | Freq: Every day | ORAL | Status: DC

## 2015-03-09 MED ORDER — CARVEDILOL 12.5 MG PO TABS: 12.5000 mg | ORAL_TABLET | Freq: Two times a day (BID) | ORAL | Status: DC

## 2015-03-09 NOTE — Patient Instructions (Signed)
Medication Instructions:  Your physician has recommended you make the following change in your medication:  1) DECREASE Losartan to 25 mg tablet by mouth ONCE daily 2) INCREASE Coreg to 12.5 mg tablet by mouth TWICE daily 3) INCREASE Lasix to 40 mg tablet by mouth ONCE daily   Labwork: Your physician recommends that you return for lab work in: TODAY (BMET/BNP)  The lab can be found on the FIRST FLOOR of out building in [place] recommends that you return for lab work in: 3 weeks (BMET)   Testing/Procedures: Your physician has requested that you have an echocardiogram. Echocardiography is a painless test that uses sound waves to create images of your heart. It provides your doctor with information about the size and shape of your heart and how well your heart's chambers and valves are working. This procedure takes approximately one hour. There are no restrictions for this procedure.    Follow-Up: We request that you follow-up in: 6 weeks with an extender and in 6 months with Dr Andria Rhein will receive a reminder letter in the mail two months in advance. If you don't receive a letter, please call our office to schedule the follow-up appointment.    Any Other Special Instructions Will Be Listed Below (If Applicable).     If you need a refill on your cardiac medications before your next appointment, please call your pharmacy.

## 2015-03-09 NOTE — Progress Notes (Signed)
03/09/2015 Patrick Humphrey   10-01-1965  [id number]  Primary Physician Monico Blitz, MD Primary Cardiologist: Lorretta Harp MD Renae Gloss   HPI:  The patient is a delightful, 49 year old, mildly overweight, married, Caucasian male, father of 2, grandfather to 2 grandchildren, who has a history of a nonischemic cardiomyopathy by myself by cath in 2002. 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 He underwent ICD placement by Dr. Tomie China back in 2009 for primary prevention. His ICD is currently followed by Dr. Sallyanne Kuster. He was recently seen 11/15/14 and his ICD was checked and interrogated. He was having runs of PSVT and short runs of nonsustained ventricular tachycardia which he was unaware of. He has had chronic liver disease of unclear etiology. He also recently has is his esophageal stretched by Dr. Britta Mccreedy in [place] for a distal stricture. Because of a diffuse rash she was placed on prednisone recently which resulted in fluid retention and cough. He since discontinuing this and increasing his Lasix is relatively edema has improved.   Current Outpatient Prescriptions  Medication Sig Dispense Refill  . ALPRAZolam (XANAX) 0.25 MG tablet Take 0.25 mg by mouth daily.    Marland Kitchen aspirin EC 81 MG tablet Take 81 mg by mouth daily.    . carvedilol (COREG) 12.5 MG tablet TAKE ONE TABLET BY MOUTH IN THE MORNING AND ONE-HALF TABLET IN THE EVENING 45 tablet 10  . cetirizine (ZYRTEC) 10 MG tablet Take 10 mg by mouth daily.    . Coenzyme Q10 (COQ-10) 200 MG CAPS Take 1 capsule by mouth daily.    . digoxin (LANOXIN) 0.125 MG tablet Take 0.125 mg by mouth daily.    Marland Kitchen esomeprazole (NEXIUM) 20 MG capsule Take 20 mg by mouth 2 (two) times daily before a meal.    . fish oil-omega-3 fatty acids 1000 MG capsule Take 1-2 g by mouth daily. 2 morning, 1 at night    . Fluticasone-Salmeterol (ADVAIR) 100-50 MCG/DOSE AEPB Inhale 1 puff into the  lungs daily.     . furosemide (LASIX) 20 MG tablet Take 20 mg by mouth daily.    Marland Kitchen losartan (COZAAR) 50 MG tablet Take 50 mg by mouth daily.    . montelukast (SINGULAIR) 10 MG tablet Take 10 mg by mouth at bedtime.    Marland Kitchen omeprazole (PRILOSEC) 20 MG capsule Take 20 mg by mouth daily as needed.      No current facility-administered medications for this visit.    Allergies  Allergen Reactions  . Penicillins   . Sulfa Antibiotics     Social History   Social History  . Marital Status: Married    Spouse Name: N/A  . Number of Children: N/A  . Years of Education: N/A   Occupational History  . Not on file.   Social History Main Topics  . Smoking status: Former Smoker    Quit date: 02/20/1998  . Smokeless tobacco: Not on file  . Alcohol Use: Yes  . Drug Use: No  . Sexual Activity: Yes   Other Topics Concern  . Not on file   Social History Narrative     Review of Systems: General: negative for chills, fever, night sweats or weight changes.  Cardiovascular: negative for chest pain, dyspnea on exertion, edema, orthopnea, palpitations, paroxysmal nocturnal dyspnea or shortness of breath Dermatological: negative for rash Respiratory: negative for cough or wheezing Urologic: negative for hematuria Abdominal: negative for nausea, vomiting,  diarrhea, bright red blood per rectum, melena, or hematemesis Neurologic: negative for visual changes, syncope, or dizziness All other systems reviewed and are otherwise negative except as noted above.    Blood pressure 82/64, pulse 96, height 5\' 11"  (1.803 m), weight 182 lb (82.555 kg).  General appearance: alert and no distress Neck: no adenopathy, no carotid bruit, no JVD, supple, symmetrical, trachea midline and thyroid not enlarged, symmetric, no tenderness/mass/nodules Lungs: clear to auscultation bilaterally Heart: regular rate and rhythm, S1, S2 normal, no murmur, click, rub or gallop Extremities: extremities normal, atraumatic, no  cyanosis or edema  EKG not performed today  ASSESSMENT AND PLAN:   Nonischemic cardiomyopathy ([place]) History of nonischemic cardiopathy documented by cardiac cath performed by myself in 2002. He had an ICD placed by Dr. Tomie China in 2009 for primary prevention currently followed by Dr. Sallyanne Kuster who saw him 3 months ago. He was having some runs of PSVT and nonsustained ventricular tachycardia at that time. He has had a rash of unclear etiology and treatment with prednisone resulting in fluid retention. He has taken increasing doses of Lasix for this. He does have a dry nonproductive cough.  His lotion and edema has somewhat improved since his prednisone was discontinued a week ago. His lungs are clear today and yesterday sodium on exam his blood pressure is 80/60. I'm going to decrease his losartan from 50 mg to 25 mg a day, increase his Lasix to 40 mg a day from 20. I'm going to increase his carvedilol from 12.5 mg the morning and 6.25 an evening to 12.5 mg by mouth twice a day. We'll check a 2-D echocardiogram. I'm going to get a basic metabolic panel and a BNP today and a basic metabolic panel again in 3 weeks. He will see mid-level provider back in 6 weeks and me back in 6 months.  ICD - Medtronic Maximo II VR 2009 Implanted by Dr. Alroy Dust 2009, followed by Dr. Sallyanne Kuster last checked 11/15/14.      Lorretta Harp MD FACP,FACC,FAHA, [facility] 03/09/2015 10:36 AM

## 2015-03-09 NOTE — Assessment & Plan Note (Signed)
Implanted by Dr. Alroy Dust 2009, followed by Dr. Sallyanne Kuster last checked 11/15/14.

## 2015-03-09 NOTE — Assessment & Plan Note (Signed)
History of nonischemic cardiopathy documented by cardiac cath performed by myself in 2002. He had an ICD placed by Dr. Tomie China in 2009 for primary prevention currently followed by Dr. Sallyanne Kuster who saw him 3 months ago. He was having some runs of PSVT and nonsustained ventricular tachycardia at that time. He has had a rash of unclear etiology and treatment with prednisone resulting in fluid retention. He has taken increasing doses of Lasix for this. He does have a dry nonproductive cough.  His lotion and edema has somewhat improved since his prednisone was discontinued a week ago. His lungs are clear today and yesterday sodium on exam his blood pressure is 80/60. I'm going to decrease his losartan from 50 mg to 25 mg a day, increase his Lasix to 40 mg a day from 20. I'm going to increase his carvedilol from 12.5 mg the morning and 6.25 an evening to 12.5 mg by mouth twice a day. We'll check a 2-D echocardiogram. I'm going to get a basic metabolic panel and a BNP today and a basic metabolic panel again in 3 weeks. He will see mid-level provider back in 6 weeks and me back in 6 months.

## 2015-03-10 LAB — BRAIN NATRIURETIC PEPTIDE: Brain Natriuretic Peptide: 1800.1 pg/mL — ABNORMAL HIGH (ref 0.0–100.0)

## 2015-03-10 LAB — PSA: PSA: 0.59 ng/mL (ref <=4.00)

## 2015-03-11 ENCOUNTER — Telehealth: Payer: Self-pay

## 2015-03-11 DIAGNOSIS — I428 Other cardiomyopathies: Secondary | ICD-10-CM

## 2015-03-11 NOTE — Telephone Encounter (Signed)
Pt notified of result. Repeat BNP to be completed with repeat BMET week of 03/28/2015. Pt mailed BNP lab slips

## 2015-03-11 NOTE — Telephone Encounter (Signed)
-----   Message from Lorretta Harp, MD sent at 03/10/2015  7:11 AM EST ----- Call and tell normal

## 2015-03-17 ENCOUNTER — Ambulatory Visit (INDEPENDENT_AMBULATORY_CARE_PROVIDER_SITE_OTHER): Payer: Medicare Other | Admitting: Internal Medicine

## 2015-03-17 ENCOUNTER — Encounter (INDEPENDENT_AMBULATORY_CARE_PROVIDER_SITE_OTHER): Payer: Self-pay | Admitting: Internal Medicine

## 2015-03-17 VITALS — BP 94/62 | HR 64 | Temp 98.0°F | Ht 71.0 in | Wt 178.1 lb

## 2015-03-17 DIAGNOSIS — R748 Abnormal levels of other serum enzymes: Secondary | ICD-10-CM

## 2015-03-17 LAB — HEPATIC FUNCTION PANEL
ALT: 45 U/L (ref 9–46)
AST: 57 U/L — ABNORMAL HIGH (ref 10–35)
Albumin: 3.4 g/dL — ABNORMAL LOW (ref 3.6–5.1)
Alkaline Phosphatase: 75 U/L (ref 40–115)
Bilirubin, Direct: 0.4 mg/dL — ABNORMAL HIGH (ref <=0.2)
Indirect Bilirubin: 0.4 mg/dL (ref 0.2–1.2)
Total Bilirubin: 0.8 mg/dL (ref 0.2–1.2)
Total Protein: 6.3 g/dL (ref 6.1–8.1)

## 2015-03-17 NOTE — Patient Instructions (Signed)
OV in 3 months. 

## 2015-03-17 NOTE — Progress Notes (Addendum)
Subjective:    Patient ID: Patrick Humphrey, male    DOB: 06/12/1965, 49 y.o.   MRN: [id number]  HPI Referred by DR. Manuella Ghazi for elevated liver enzymes.  He was seeing Dr. Britta Mccreedy, but Dr. Britta Mccreedy is leaving town. He had been seeing Dr Britta Mccreedy for about 6 months. He says he has a complete work up with Dr. Britta Mccreedy. He was negative for auto immune hepatitis, alpha 1 antitrypsin deficiency, hepatitis C, iron sat was 61%,, ferritin 573. ANA normal. PT/INR 10/0.9, Iron 146, TIBC 239  Patient states he continues to drink. He was drinking 3-4 drinks a night for at least a year. He says his elevated liver enzymes are from his etoh abuse.  He says now he is sporadically drinking. He is trying to decrease his  drinking.    Appetite is getting better. He underwent an EGD/ED about a month ago for dysphagia.  Usually has a BM daily. No melena or BRRB.  Retired Technical brewer. Significant Cardiac hx and has a defibrillator in place. 01/21/2015 EGD: Duodenal mucosa showed no abnormality. Cline Cools was found in the gastric antrum. Mild narrowing of the distal esophagus, dilated over a guide wire with a 1 mm Savory dilator. Retroflexed views revealed no abnormalities.   09/30/2014 ALP 83, ALT 72, AST 84.4 01/30/2015 ALP 180, AST 185, ALT 183.  Lipid panel normal H and H 15.6 and 45.3, MCV 96, Platelet ct 166.  Review of Systems Past Medical History  Diagnosis Date  . Nonischemic cardiomyopathy ([place])   . ICD (implantable cardioverter-defibrillator), single, in situ 08/26/2007  . Hx of echocardiogram 11/09/1910    Showed an EF of 25% with no significant valve disease.    Past Surgical History  Procedure Laterality Date  . Appendectomy    . Pacemaker insertion  08/26/2007    place by Dr. Alroy Dust at [facility]  . Cardiac catheterization  2002    Normal coronary arteries    Allergies  Allergen Reactions  . Penicillins   . Sulfa Antibiotics     Current Outpatient Prescriptions on File Prior to Visit    Medication Sig Dispense Refill  . ALPRAZolam (XANAX) 0.25 MG tablet Take 0.25 mg by mouth daily.    Marland Kitchen aspirin EC 81 MG tablet Take 81 mg by mouth daily.    . carvedilol (COREG) 12.5 MG tablet Take 1 tablet (12.5 mg total) by mouth 2 (two) times daily with a meal. 180 tablet 3  . cetirizine (ZYRTEC) 10 MG tablet Take 10 mg by mouth daily.    . Coenzyme Q10 (COQ-10) 200 MG CAPS Take 1 capsule by mouth daily.    . digoxin (LANOXIN) 0.125 MG tablet Take 0.125 mg by mouth daily.    Marland Kitchen esomeprazole (NEXIUM) 20 MG capsule Take 20 mg by mouth 2 (two) times daily before a meal.    . fish oil-omega-3 fatty acids 1000 MG capsule Take 1-2 g by mouth daily. 2 morning, 1 at night    . Fluticasone-Salmeterol (ADVAIR) 100-50 MCG/DOSE AEPB Inhale 1 puff into the lungs daily.     . furosemide (LASIX) 20 MG tablet Take 2 tablets (40 mg total) by mouth daily. 180 tablet 3  . losartan (COZAAR) 25 MG tablet Take 1 tablet (25 mg total) by mouth daily. (Patient taking differently: Take 50 mg by mouth daily. ) 90 tablet 3  . montelukast (SINGULAIR) 10 MG tablet Take 10 mg by mouth at bedtime.     No current facility-administered medications on file prior to  visit.        Objective:   Physical Exam Blood pressure 94/62, pulse 64, temperature 98 F (36.7 C), height 5\' 11"  (1.803 m), weight 178 lb 1.6 oz (80.786 kg).  Alert and oriented. Skin warm and dry. Oral mucosa is moist.   . Sclera anicteric, conjunctivae is pink. Thyroid not enlarged. No cervical lymphadenopathy. Lungs clear. Heart regular rate and rhythm.  Abdomen is soft. Bowel sounds are positive. No hepatomegaly. No abdominal masses felt. No tenderness.  No edema to lower extremities.         Assessment & Plan:  El;evated liver enzymes.  Am going to get records from DR. Britta Mccreedy.  Further recommendations to follow.  Hepatic function today.  OV in 3 months.

## 2015-03-18 ENCOUNTER — Ambulatory Visit (HOSPITAL_COMMUNITY): Payer: Medicare Other | Attending: Cardiology

## 2015-03-18 ENCOUNTER — Other Ambulatory Visit: Payer: Self-pay

## 2015-03-18 ENCOUNTER — Encounter (INDEPENDENT_AMBULATORY_CARE_PROVIDER_SITE_OTHER): Payer: Self-pay

## 2015-03-18 ENCOUNTER — Ambulatory Visit: Payer: Medicare Other | Admitting: Cardiovascular Disease

## 2015-03-18 DIAGNOSIS — I519 Heart disease, unspecified: Secondary | ICD-10-CM | POA: Diagnosis not present

## 2015-03-18 DIAGNOSIS — Z87891 Personal history of nicotine dependence: Secondary | ICD-10-CM | POA: Diagnosis not present

## 2015-03-18 DIAGNOSIS — I429 Cardiomyopathy, unspecified: Secondary | ICD-10-CM | POA: Diagnosis not present

## 2015-03-18 DIAGNOSIS — I517 Cardiomegaly: Secondary | ICD-10-CM | POA: Diagnosis not present

## 2015-03-18 DIAGNOSIS — I340 Nonrheumatic mitral (valve) insufficiency: Secondary | ICD-10-CM | POA: Insufficient documentation

## 2015-03-18 DIAGNOSIS — Z95810 Presence of automatic (implantable) cardiac defibrillator: Secondary | ICD-10-CM | POA: Diagnosis not present

## 2015-03-18 DIAGNOSIS — I428 Other cardiomyopathies: Secondary | ICD-10-CM

## 2015-03-31 ENCOUNTER — Ambulatory Visit (INDEPENDENT_AMBULATORY_CARE_PROVIDER_SITE_OTHER): Payer: Medicare Other | Admitting: Cardiovascular Disease

## 2015-03-31 ENCOUNTER — Encounter: Payer: Self-pay | Admitting: Cardiovascular Disease

## 2015-03-31 VITALS — BP 90/72 | HR 68 | Ht 71.0 in | Wt 179.0 lb

## 2015-03-31 DIAGNOSIS — Z95810 Presence of automatic (implantable) cardiac defibrillator: Secondary | ICD-10-CM | POA: Diagnosis not present

## 2015-03-31 DIAGNOSIS — I519 Heart disease, unspecified: Secondary | ICD-10-CM | POA: Diagnosis not present

## 2015-03-31 DIAGNOSIS — I429 Cardiomyopathy, unspecified: Secondary | ICD-10-CM | POA: Diagnosis not present

## 2015-03-31 DIAGNOSIS — N400 Enlarged prostate without lower urinary tract symptoms: Secondary | ICD-10-CM

## 2015-03-31 DIAGNOSIS — I428 Other cardiomyopathies: Secondary | ICD-10-CM

## 2015-03-31 MED ORDER — FUROSEMIDE 20 MG PO TABS: 20.0000 mg | ORAL_TABLET | Freq: Every day | ORAL | Status: DC

## 2015-03-31 NOTE — Assessment & Plan Note (Addendum)
History of nonischemic cardiomyopathy with recent volume overload secondary to steroid use because of a skin rash. I will increase his Lasix from 20-40 mg a day resulted in improvement in his edema. I'm going to go back to 20 mg a day and reserved 44 weight gain and/or volume overload. I repeated a 2-D echo which showed severe LV dilatation with an EF of 20% for all intents and purposes unchanged from his prior echo 4 years ago.continue current medications

## 2015-03-31 NOTE — Progress Notes (Signed)
Patrick Humphrey returns for follow-up 2-D echo which showed dilated LV with an EF of 20%, unchanged from his echo 4 years ago. He appears euvolemic. I'm going to decrease his Lasix from 40-20 mg a day. His blood pressure runs chronically low. Dr. Sallyanne Kuster follows his ICD. I will see him back in 6 months.

## 2015-03-31 NOTE — Patient Instructions (Addendum)
Medication Instructions:  Your physician has recommended you make the following change in your medication:  1) DECREASE Lasix 20 mg by mouth daily - you may take 40 mg by mouth daily as needed for swelling.    Labwork: none  Testing/Procedures: none  Follow-Up: Your physician wants you to follow-up in: 6 months with Dr. Gwenlyn Found. You will receive a reminder letter in the mail two months in advance. If you don't receive a letter, please call our office to schedule the follow-up appointment.   Any Other Special Instructions Will Be Listed Below (If Applicable)  You have been referred to [facility] Dr. Franchot Gallo.   If you need a refill on your cardiac medications before your next appointment, please call your pharmacy.

## 2015-04-20 ENCOUNTER — Ambulatory Visit: Payer: Medicare Other | Admitting: Physician Assistant

## 2015-04-22 ENCOUNTER — Telehealth: Payer: Self-pay | Admitting: Cardiovascular Disease

## 2015-04-22 NOTE — Telephone Encounter (Signed)
Called pt back and noted Dr. Jacalyn Lefevre recommendations. Advised to call if further concerns - pt voiced understanding.

## 2015-04-22 NOTE — Telephone Encounter (Signed)
Last time he saw Dr Gwenlyn Found his blood pressure was running low. Since that time it it is really running low. have been running really low. Pt wonders if he might need to stop taking his blood pressure medicine.

## 2015-04-22 NOTE — Addendum Note (Signed)
Addended by: Theodore Demark on: 04/22/2015 04:54 PM   Modules accepted: Orders, Medications

## 2015-04-22 NOTE — Telephone Encounter (Signed)
Pt notes daily feeling of fatigue, low BP when checked at home and at provider office  (90/60 when checked at PCP yesterday, 90/70 at home - he does not recall his HRs).   BP has been low consistently - noted when he was last seen in office -- cozaar dose cut at that time from 50mg  to 25mg  daily.  He is also on coreg 12.5mg  BID.  I advised OK to stop cozaar. Continue to check BPs. F/u scheduled w/ Lurena Joiner for med mgmt.  Routing to [employer] for any further recommendations.

## 2015-04-22 NOTE — Telephone Encounter (Signed)
Agree with recommendations   Kirk Ruths

## 2015-04-27 ENCOUNTER — Telehealth: Payer: Self-pay | Admitting: Cardiovascular Disease

## 2015-04-27 NOTE — Telephone Encounter (Signed)
Expand All Collapse All   Pt is just leaving his primary doctor,the doctor thinks he needs to be seen. He feels the pt has a lot of stress on his heart. Pt has an appointment with the PA on 05-05-15,the doctor wants him seen sooner.        Patient said that he is just weak and he just got out of the hospital after 2L of fluids.  Concerned because of his cardiomyopathy.  His PCP said there was something off about his heart reading but the patient could not remember what it was

## 2015-04-27 NOTE — Telephone Encounter (Signed)
Pt is just leaving his primary doctor,the doctor thinks he needs to be seen. He feels the pt has a lot of stress on his heart. Pt has an appointment with the PA on 05-05-15,the doctor wants him seen sooner.

## 2015-04-27 NOTE — Telephone Encounter (Signed)
Spoke with pt, Follow up scheduled  Patient voiced understanding of appt time and location

## 2015-04-28 NOTE — Progress Notes (Signed)
Cardiology Office Note:    Date:  04/29/2015   ID:  Patrick Humphrey, DOB 1965/12/15, MRN [id number]  PCP:  Patrick Blitz, MD  Cardiologist:  Dr. Quay Humphrey   Electrophysiologist:  Dr. Sanda Humphrey   Chief Complaint  Patient presents with  . Low Blood Pressure    History of Present Illness:    Patrick Humphrey is a 50 y.o. male with a hx of systolic HF, NICM, status post AICD, NSVT. Last seen by Dr. Gwenlyn Humphrey 12/16.    Notes in the patient's chart indicate he was in the hospital recently and received IV fluids 2 L. His blood pressure has been running low. Earlier follow-up was arranged today.  The patient has had significant issues with volume excess since he was treated with steroids dating back to October. This was for some type of rash. Over the past 2 weeks, he has felt worse. He notes dyspnea with minimal activity. He notes significant orthopnea and PND. He has had increasing LE edema. He denies chest pain or syncope. He denies ICD discharges.  He does have a cough. He has had some yellowish sputum. He denies hemoptysis. He has been wheezing. He has a poor appetite.  He went to the [facility] ED 1/14. Of note, BNP was over 11,000. Chest x-ray demonstrated vascular congestion. He was given IV fluids 2 L. He states that he has felt worse since receiving IV fluids.  Of note he was told he had elevated LFTs.  I suspect he has some hepatic congestion as well.    Past Medical History  Diagnosis Date  . Nonischemic cardiomyopathy ([place])   . ICD (implantable cardioverter-defibrillator), single, in situ 08/26/2007  . Hx of echocardiogram 11/09/1910    Showed an EF of 25% with no significant valve disease.    Past Surgical History  Procedure Laterality Date  . Appendectomy    . Pacemaker insertion  08/26/2007    place by Patrick Humphrey [facility]  . Cardiac catheterization  2002    Normal coronary arteries    Current Medications: Outpatient Prescriptions Prior  to Visit  Medication Sig Dispense Refill  . acetaminophen (TYLENOL) 500 MG tablet Take 500 mg by mouth every 6 (six) hours as needed for moderate pain.     Marland Kitchen ALPRAZolam (XANAX) 0.25 MG tablet Take 0.25 mg by mouth daily as needed for anxiety or sleep.     Marland Kitchen aspirin EC 81 MG tablet Take 81 mg by mouth daily.    . carvedilol (COREG) 12.5 MG tablet Take 1 tablet (12.5 mg total) by mouth 2 (two) times daily with a meal. 180 tablet 3  . cetirizine (ZYRTEC) 10 MG tablet Take 10 mg by mouth daily.    . Coenzyme Q10 (COQ-10) 200 MG CAPS Take 1 capsule by mouth daily.    . digoxin (LANOXIN) 0.125 MG tablet Take 0.125 mg by mouth daily.    . fish oil-omega-3 fatty acids 1000 MG capsule Take 1-2 g by mouth daily. 2 morning, 1 at night    . furosemide (LASIX) 20 MG tablet Take 1 tablet (20 mg total) by mouth daily. My take 40 mg by mouth daily as needed for swelling. 180 tablet 3  . montelukast (SINGULAIR) 10 MG tablet Take 10 mg by mouth at bedtime.    Marland Kitchen esomeprazole (NEXIUM) 20 MG capsule Take 40 mg by mouth daily. Reported on 04/29/2015    . Fluticasone-Salmeterol (ADVAIR) 100-50 MCG/DOSE AEPB Inhale 1 puff into the lungs daily.  Reported on 04/29/2015    . Multiple Vitamins-Minerals (MEGA MULTI MEN PO) Take by mouth. Reported on 04/29/2015     No facility-administered medications prior to visit.     Allergies:   Penicillins and Sulfa antibiotics   Social History   Social History  . Marital Status: Married    Spouse Name: N/A  . Number of Children: N/A  . Years of Education: N/A   Social History Main Topics  . Smoking status: Former Smoker    Quit date: 02/20/1998  . Smokeless tobacco: None  . Alcohol Use: 0.0 oz/week    0 Standard drinks or equivalent per week     Comment: has quit drinking x 1 1/2 months. He sporadically drinks  . Drug Use: No  . Sexual Activity: Yes   Other Topics Concern  . None   Social History Narrative     Family History:  The patient's family history includes  Cancer - Prostate in his maternal grandfather; Hypertension in his father and mother.   ROS:   Please see the history of present illness.    Review of Systems  Constitution: Positive for decreased appetite, malaise/fatigue and weight gain.  Cardiovascular: Positive for dyspnea on exertion, irregular heartbeat and leg swelling.  Respiratory: Positive for cough and shortness of breath.   Musculoskeletal: Positive for back pain.  Genitourinary: Positive for incomplete emptying.  Neurological: Positive for loss of balance.  Psychiatric/Behavioral: The patient is nervous/anxious.   All other systems reviewed and are negative.   Physical Exam:    VS:  BP 111/87 mmHg  Pulse 100  Ht 5\' 11"  (1.803 m)  Wt 187 lb 1.9 oz (84.877 kg)  BMI 26.11 kg/m2  SpO2 97%   GEN: Well nourished, well developed, in no acute distress HEENT: normal Neck: no JVD, no masses Cardiac: Normal S1/S2, RRR; no murmurs   Respiratory:  Decreased breath sounds bilateral bases, no wheezing, no rales, question egophony bilateral bases GI: distended MS: no deformity or atrophy Skin: warm and dry, no rash Neuro:  no focal deficits  Psych: Alert and oriented x 3, normal affect  Wt Readings from Last 3 Encounters:  04/29/15 187 lb 1.9 oz (84.877 kg)  03/31/15 179 lb (81.194 kg)  03/17/15 178 lb 1.6 oz (80.786 kg)      Studies/Labs Reviewed:    EKG:  EKG is  ordered today.  The ekg ordered today demonstrates NSR, HR 99, low voltage, septal Q waves, IVCD, QTc 446 ms, no significant change when compared to prior tracings  Recent Labs: 03/09/2015: BUN 19; Creat 0.85; Potassium 4.1; Sodium 138 03/17/2015: ALT 45   Recent Lipid Panel No results Humphrey for: CHOL, TRIG, HDL, CHOLHDL, VLDL, LDLCALC, LDLDIRECT  Additional studies/ records that were reviewed today include:   Echo 03/18/15 EF 20-25%, diff HK, Gr 2 DD, mod MR, mod LAE, mod reduced RVSF, mild RAE  LHC 2/02 Normal coronary arteries EF 10%  ASSESSMENT:     1. Acute on chronic systolic CHF (congestive heart failure) ([place])   2. Nonischemic cardiomyopathy ([place])   3. ICD - Medtronic Maximo II VR 2009     PLAN:    In order of problems listed above:  Acute on chronic systolic CHF - Patient presents with Class 3-3b CHF.  His abdomen is distended and he may have bilateral pleural effusions based upon my exam.  He is hypotensive and likely has some low output HF.  I have recommended admission to the hospital for further management.  I discussed this with Dr. Candee Furbish who agreed.    -  Admit to [facility]  -  Start IV Lasix 40 mg BID  -  DVT dose Lovenox  -  Obtain CXR, BMET, LFTs, CBC, Mg2+, BNP, Dig level , TSH  -  Continue Coreg, Dig  -  Attempt to resume angiotensin receptor blocker at some point  -  Consider addition of Entresto as well  -  He may require Dopamine or Milrinone   -  May need HF Team to see as well    Medication Adjustments/Labs and Tests Ordered: Current medicines are reviewed at length with the patient today.  Concerns regarding medicines are outlined above.  Medication changes, Labs and Tests ordered today are outlined in the Patient Instructions noted below. Patient Instructions  YOU HAVE  BEEN ADMITTED TO [facility] TO UNIT 3 EAST      Signed, Richardson Dopp, PA-C  04/29/2015 10:14 AM    [facility] [place], [place], [place]  [address] Phone: [phone]; Fax: [phone]

## 2015-04-29 ENCOUNTER — Ambulatory Visit (INDEPENDENT_AMBULATORY_CARE_PROVIDER_SITE_OTHER): Payer: Medicare Other | Admitting: Physician Assistant

## 2015-04-29 ENCOUNTER — Encounter: Payer: Self-pay | Admitting: Physician Assistant

## 2015-04-29 ENCOUNTER — Inpatient Hospital Stay (HOSPITAL_COMMUNITY)
Admission: AD | Admit: 2015-04-29 | Discharge: 2015-05-30 | DRG: 001 | Disposition: A | Discharge status: Home Health | Payer: Medicare Other | Source: Ambulatory Visit | Attending: Cardiothoracic Surgery | Admitting: Cardiothoracic Surgery

## 2015-04-29 VITALS — BP 111/87 | HR 100 | Ht 71.0 in | Wt 187.1 lb

## 2015-04-29 DIAGNOSIS — I5043 Acute on chronic combined systolic (congestive) and diastolic (congestive) heart failure: Principal | ICD-10-CM | POA: Diagnosis present

## 2015-04-29 DIAGNOSIS — Z6822 Body mass index (BMI) 22.0-22.9, adult: Secondary | ICD-10-CM | POA: Diagnosis not present

## 2015-04-29 DIAGNOSIS — D696 Thrombocytopenia, unspecified: Secondary | ICD-10-CM | POA: Diagnosis not present

## 2015-04-29 DIAGNOSIS — I4891 Unspecified atrial fibrillation: Secondary | ICD-10-CM | POA: Diagnosis not present

## 2015-04-29 DIAGNOSIS — Z79899 Other long term (current) drug therapy: Secondary | ICD-10-CM | POA: Diagnosis not present

## 2015-04-29 DIAGNOSIS — Z452 Encounter for adjustment and management of vascular access device: Secondary | ICD-10-CM

## 2015-04-29 DIAGNOSIS — I313 Pericardial effusion (noninflammatory): Secondary | ICD-10-CM | POA: Diagnosis not present

## 2015-04-29 DIAGNOSIS — T827XXA Infection and inflammatory reaction due to other cardiac and vascular devices, implants and grafts, initial encounter: Secondary | ICD-10-CM | POA: Diagnosis not present

## 2015-04-29 DIAGNOSIS — Z87891 Personal history of nicotine dependence: Secondary | ICD-10-CM | POA: Diagnosis not present

## 2015-04-29 DIAGNOSIS — R570 Cardiogenic shock: Secondary | ICD-10-CM | POA: Diagnosis present

## 2015-04-29 DIAGNOSIS — Y831 Surgical operation with implant of artificial internal device as the cause of abnormal reaction of the patient, or of later complication, without mention of misadventure at the time of the procedure: Secondary | ICD-10-CM | POA: Diagnosis not present

## 2015-04-29 DIAGNOSIS — E876 Hypokalemia: Secondary | ICD-10-CM | POA: Diagnosis not present

## 2015-04-29 DIAGNOSIS — Y92239 Unspecified place in hospital as the place of occurrence of the external cause: Secondary | ICD-10-CM | POA: Diagnosis not present

## 2015-04-29 DIAGNOSIS — J9382 Other air leak: Secondary | ICD-10-CM | POA: Diagnosis not present

## 2015-04-29 DIAGNOSIS — L03313 Cellulitis of chest wall: Secondary | ICD-10-CM | POA: Diagnosis not present

## 2015-04-29 DIAGNOSIS — Z789 Other specified health status: Secondary | ICD-10-CM

## 2015-04-29 DIAGNOSIS — K761 Chronic passive congestion of liver: Secondary | ICD-10-CM | POA: Diagnosis present

## 2015-04-29 DIAGNOSIS — E871 Hypo-osmolality and hyponatremia: Secondary | ICD-10-CM | POA: Diagnosis not present

## 2015-04-29 DIAGNOSIS — R319 Hematuria, unspecified: Secondary | ICD-10-CM | POA: Diagnosis not present

## 2015-04-29 DIAGNOSIS — Z8546 Personal history of malignant neoplasm of prostate: Secondary | ICD-10-CM | POA: Diagnosis not present

## 2015-04-29 DIAGNOSIS — I429 Cardiomyopathy, unspecified: Secondary | ICD-10-CM | POA: Diagnosis not present

## 2015-04-29 DIAGNOSIS — Z515 Encounter for palliative care: Secondary | ICD-10-CM | POA: Insufficient documentation

## 2015-04-29 DIAGNOSIS — I519 Heart disease, unspecified: Secondary | ICD-10-CM

## 2015-04-29 DIAGNOSIS — Z95810 Presence of automatic (implantable) cardiac defibrillator: Secondary | ICD-10-CM | POA: Diagnosis not present

## 2015-04-29 DIAGNOSIS — I4892 Unspecified atrial flutter: Secondary | ICD-10-CM | POA: Diagnosis not present

## 2015-04-29 DIAGNOSIS — Z4682 Encounter for fitting and adjustment of non-vascular catheter: Secondary | ICD-10-CM

## 2015-04-29 DIAGNOSIS — F419 Anxiety disorder, unspecified: Secondary | ICD-10-CM | POA: Diagnosis not present

## 2015-04-29 DIAGNOSIS — I5023 Acute on chronic systolic (congestive) heart failure: Secondary | ICD-10-CM

## 2015-04-29 DIAGNOSIS — E46 Unspecified protein-calorie malnutrition: Secondary | ICD-10-CM | POA: Diagnosis present

## 2015-04-29 DIAGNOSIS — J939 Pneumothorax, unspecified: Secondary | ICD-10-CM | POA: Diagnosis not present

## 2015-04-29 DIAGNOSIS — Z01818 Encounter for other preprocedural examination: Secondary | ICD-10-CM

## 2015-04-29 DIAGNOSIS — Z7982 Long term (current) use of aspirin: Secondary | ICD-10-CM | POA: Diagnosis not present

## 2015-04-29 DIAGNOSIS — Z7951 Long term (current) use of inhaled steroids: Secondary | ICD-10-CM | POA: Diagnosis not present

## 2015-04-29 DIAGNOSIS — Z01810 Encounter for preprocedural cardiovascular examination: Secondary | ICD-10-CM | POA: Diagnosis not present

## 2015-04-29 DIAGNOSIS — R0602 Shortness of breath: Secondary | ICD-10-CM

## 2015-04-29 DIAGNOSIS — I351 Nonrheumatic aortic (valve) insufficiency: Secondary | ICD-10-CM | POA: Diagnosis present

## 2015-04-29 DIAGNOSIS — Z95811 Presence of heart assist device: Secondary | ICD-10-CM | POA: Diagnosis not present

## 2015-04-29 DIAGNOSIS — I428 Other cardiomyopathies: Secondary | ICD-10-CM

## 2015-04-29 DIAGNOSIS — K7200 Acute and subacute hepatic failure without coma: Secondary | ICD-10-CM | POA: Diagnosis not present

## 2015-04-29 DIAGNOSIS — I509 Heart failure, unspecified: Secondary | ICD-10-CM

## 2015-04-29 DIAGNOSIS — I5022 Chronic systolic (congestive) heart failure: Secondary | ICD-10-CM

## 2015-04-29 DIAGNOSIS — I255 Ischemic cardiomyopathy: Secondary | ICD-10-CM | POA: Diagnosis not present

## 2015-04-29 DIAGNOSIS — D62 Acute posthemorrhagic anemia: Secondary | ICD-10-CM | POA: Diagnosis not present

## 2015-04-29 DIAGNOSIS — R0600 Dyspnea, unspecified: Secondary | ICD-10-CM | POA: Diagnosis present

## 2015-04-29 HISTORY — DX: Presence of cardiac pacemaker: Z95.0

## 2015-04-29 HISTORY — DX: Heart failure, unspecified: I50.9

## 2015-04-29 HISTORY — DX: Presence of automatic (implantable) cardiac defibrillator: Z95.810

## 2015-04-29 LAB — CBC WITH DIFFERENTIAL/PLATELET
Basophils Absolute: 0.1 10*3/uL (ref 0.0–0.1)
Basophils Relative: 1 %
Eosinophils Absolute: 0.1 10*3/uL (ref 0.0–0.7)
Eosinophils Relative: 1 %
HCT: 44.1 % (ref 39.0–52.0)
Hemoglobin: 15.5 g/dL (ref 13.0–17.0)
Lymphocytes Relative: 28 %
Lymphs Abs: 2.2 10*3/uL (ref 0.7–4.0)
MCH: 33.5 pg (ref 26.0–34.0)
MCHC: 35.1 g/dL (ref 30.0–36.0)
MCV: 95.2 fL (ref 78.0–100.0)
Monocytes Absolute: 0.8 10*3/uL (ref 0.1–1.0)
Monocytes Relative: 10 %
Neutro Abs: 4.7 10*3/uL (ref 1.7–7.7)
Neutrophils Relative %: 60 %
Platelets: 187 10*3/uL (ref 150–400)
RBC: 4.63 MIL/uL (ref 4.22–5.81)
RDW: 16.3 % — ABNORMAL HIGH (ref 11.5–15.5)
WBC: 7.7 10*3/uL (ref 4.0–10.5)

## 2015-04-29 LAB — COMPREHENSIVE METABOLIC PANEL
ALT: 50 U/L (ref 17–63)
AST: 73 U/L — ABNORMAL HIGH (ref 15–41)
Albumin: 3.3 g/dL — ABNORMAL LOW (ref 3.5–5.0)
Alkaline Phosphatase: 103 U/L (ref 38–126)
Anion gap: 15 (ref 5–15)
BUN: 20 mg/dL (ref 6–20)
CO2: 25 mmol/L (ref 22–32)
Calcium: 9.2 mg/dL (ref 8.9–10.3)
Chloride: 96 mmol/L — ABNORMAL LOW (ref 101–111)
Creatinine, Ser: 1.21 mg/dL (ref 0.61–1.24)
GFR calc Af Amer: >60 mL/min (ref >60)
GFR calc non Af Amer: >60 mL/min (ref >60)
Glucose, Bld: 115 mg/dL — ABNORMAL HIGH (ref 65–99)
Potassium: 3.8 mmol/L (ref 3.5–5.1)
Sodium: 136 mmol/L (ref 135–145)
Total Bilirubin: 4.2 mg/dL — ABNORMAL HIGH (ref 0.3–1.2)
Total Protein: 7 g/dL (ref 6.5–8.1)

## 2015-04-29 LAB — TSH: TSH: 4.135 u[IU]/mL (ref 0.350–4.500)

## 2015-04-29 LAB — DIGOXIN LEVEL: Digoxin Level: 0.9 ng/mL (ref 0.8–2.0)

## 2015-04-29 LAB — BRAIN NATRIURETIC PEPTIDE: B Natriuretic Peptide: >4500 pg/mL — ABNORMAL HIGH (ref 0.0–100.0)

## 2015-04-29 LAB — PROTIME-INR
INR: 1.32 (ref 0.00–1.49)
Prothrombin Time: 16.5 seconds — ABNORMAL HIGH (ref 11.6–15.2)

## 2015-04-29 LAB — APTT: aPTT: 30 seconds (ref 24–37)

## 2015-04-29 LAB — MAGNESIUM: Magnesium: 1.5 mg/dL — ABNORMAL LOW (ref 1.7–2.4)

## 2015-04-29 MED ORDER — DIGOXIN 125 MCG PO TABS
0.1250 mg | ORAL_TABLET | Freq: Every day | ORAL | Status: DC
  Administered 2015-04-29 – 2015-05-03 (×5): 0.125 mg via ORAL
  Filled 2015-04-29 (×6): qty 1

## 2015-04-29 MED ORDER — SODIUM CHLORIDE 0.9 % IJ SOLN
3.0000 mL | Freq: Two times a day (BID) | INTRAMUSCULAR | Status: DC
  Administered 2015-04-29 – 2015-05-09 (×16): 3 mL via INTRAVENOUS

## 2015-04-29 MED ORDER — ENOXAPARIN SODIUM 40 MG/0.4ML ~~LOC~~ SOLN
40.0000 mg | Freq: Every day | SUBCUTANEOUS | Status: DC
  Administered 2015-04-29 – 2015-05-03 (×5): 40 mg via SUBCUTANEOUS
  Filled 2015-04-29 (×5): qty 0.4

## 2015-04-29 MED ORDER — CARVEDILOL 12.5 MG PO TABS
12.5000 mg | ORAL_TABLET | Freq: Two times a day (BID) | ORAL | Status: DC
  Administered 2015-04-30 (×2): 12.5 mg via ORAL
  Filled 2015-04-29 (×2): qty 1

## 2015-04-29 MED ORDER — ONDANSETRON HCL 4 MG/2ML IJ SOLN
4.0000 mg | Freq: Four times a day (QID) | INTRAMUSCULAR | Status: DC | PRN
  Administered 2015-05-05 – 2015-05-08 (×3): 4 mg via INTRAVENOUS
  Filled 2015-04-29 (×3): qty 2

## 2015-04-29 MED ORDER — LORATADINE 10 MG PO TABS
10.0000 mg | ORAL_TABLET | Freq: Every day | ORAL | Status: DC
  Administered 2015-04-29 – 2015-05-03 (×5): 10 mg via ORAL
  Filled 2015-04-29 (×6): qty 1

## 2015-04-29 MED ORDER — ASPIRIN EC 81 MG PO TBEC
81.0000 mg | DELAYED_RELEASE_TABLET | Freq: Every day | ORAL | Status: DC
  Administered 2015-04-29 – 2015-05-09 (×10): 81 mg via ORAL
  Filled 2015-04-29 (×11): qty 1

## 2015-04-29 MED ORDER — FUROSEMIDE 10 MG/ML IJ SOLN
40.0000 mg | Freq: Two times a day (BID) | INTRAMUSCULAR | Status: DC
  Administered 2015-04-29 – 2015-04-30 (×4): 40 mg via INTRAVENOUS
  Filled 2015-04-29 (×4): qty 4

## 2015-04-29 MED ORDER — POTASSIUM CHLORIDE CRYS ER 10 MEQ PO TBCR
10.0000 meq | EXTENDED_RELEASE_TABLET | Freq: Two times a day (BID) | ORAL | Status: DC
  Administered 2015-04-29 – 2015-04-30 (×4): 10 meq via ORAL
  Filled 2015-04-29 (×5): qty 1

## 2015-04-29 MED ORDER — MONTELUKAST SODIUM 10 MG PO TABS
10.0000 mg | ORAL_TABLET | Freq: Every day | ORAL | Status: DC
  Administered 2015-04-29 – 2015-05-09 (×11): 10 mg via ORAL
  Filled 2015-04-29 (×11): qty 1

## 2015-04-29 MED ORDER — PANTOPRAZOLE SODIUM 40 MG PO TBEC
40.0000 mg | DELAYED_RELEASE_TABLET | Freq: Every day | ORAL | Status: DC
  Administered 2015-04-29 – 2015-05-03 (×5): 40 mg via ORAL
  Filled 2015-04-29 (×6): qty 1

## 2015-04-29 MED ORDER — ACETAMINOPHEN 500 MG PO TABS
500.0000 mg | ORAL_TABLET | Freq: Four times a day (QID) | ORAL | Status: DC | PRN
  Administered 2015-04-29 – 2015-05-05 (×6): 500 mg via ORAL
  Filled 2015-04-29 (×6): qty 1

## 2015-04-29 MED ORDER — ALPRAZOLAM 0.25 MG PO TABS
0.2500 mg | ORAL_TABLET | Freq: Every day | ORAL | Status: DC | PRN
  Administered 2015-04-29 – 2015-05-08 (×7): 0.25 mg via ORAL
  Filled 2015-04-29 (×7): qty 1

## 2015-04-29 MED ORDER — SODIUM CHLORIDE 0.9 % IJ SOLN: 3.0000 mL | INTRAMUSCULAR | Status: DC | PRN

## 2015-04-29 MED ORDER — SODIUM CHLORIDE 0.9 % IV SOLN
250.0000 mL | INTRAVENOUS | Status: DC | PRN
  Administered 2015-05-01 – 2015-05-05 (×2): 250 mL via INTRAVENOUS
  Administered 2015-05-10: 13:00:00 via INTRAVENOUS

## 2015-04-29 MED ORDER — CARVEDILOL 12.5 MG PO TABS
12.5000 mg | ORAL_TABLET | Freq: Two times a day (BID) | ORAL | Status: DC
  Administered 2015-04-29: 12.5 mg via ORAL
  Filled 2015-04-29: qty 1

## 2015-04-29 NOTE — Progress Notes (Signed)
Pt having multi-focal PVCs, couplets & 5 beat run of Vtach. Paged PA. Will continue to monitor.

## 2015-04-29 NOTE — H&P (Signed)
Admission History and Physical:    Date:  04/29/2015   ID:  Patrick Humphrey, DOB 1965-07-31, MRN [id number]  PCP:  Monico Blitz, MD  Cardiologist:  Dr. Quay Burow   Electrophysiologist:  Dr. Sanda Klein   Chief Complaint  Patient presents with  . Low Blood Pressure    History of Present Illness:    Patrick Humphrey is a 50 y.o. male with a hx of systolic HF, NICM, status post AICD, NSVT. Last seen by Dr. Gwenlyn Found 12/16.    Notes in the patient's chart indicate he was in the hospital recently and received IV fluids 2 L. His blood pressure has been running low. Earlier follow-up was arranged today.  The patient has had significant issues with volume excess since he was treated with steroids dating back to October. This was for some type of rash. Over the past 2 weeks, he has felt worse. He notes dyspnea with minimal activity. He notes significant orthopnea and PND. He has had increasing LE edema. He denies chest pain or syncope. He denies ICD discharges.  He does have a cough. He has had some yellowish sputum. He denies hemoptysis. He has been wheezing. He has a poor appetite.  He went to the [facility] ED 1/14. Of note, BNP was over 11,000. Chest x-ray demonstrated vascular congestion. He was given IV fluids 2 L. He states that he has felt worse since receiving IV fluids.  Of note he was told he had elevated LFTs.  I suspect he has some hepatic congestion as well.    Past Medical History  Diagnosis Date  . Nonischemic cardiomyopathy ([place])   . ICD (implantable cardioverter-defibrillator), single, in situ 08/26/2007  . Hx of echocardiogram 11/09/1910    Showed an EF of 25% with no significant valve disease.    Past Surgical History  Procedure Laterality Date  . Appendectomy    . Pacemaker insertion  08/26/2007    place by Dr. Alroy Dust at [facility]  . Cardiac catheterization  2002    Normal coronary arteries    Current Medications: Outpatient  Prescriptions Prior to Visit  Medication Sig Dispense Refill  . acetaminophen (TYLENOL) 500 MG tablet Take 500 mg by mouth every 6 (six) hours as needed for moderate pain.     Marland Kitchen ALPRAZolam (XANAX) 0.25 MG tablet Take 0.25 mg by mouth daily as needed for anxiety or sleep.     Marland Kitchen aspirin EC 81 MG tablet Take 81 mg by mouth daily.    . carvedilol (COREG) 12.5 MG tablet Take 1 tablet (12.5 mg total) by mouth 2 (two) times daily with a meal. 180 tablet 3  . cetirizine (ZYRTEC) 10 MG tablet Take 10 mg by mouth daily.    . Coenzyme Q10 (COQ-10) 200 MG CAPS Take 1 capsule by mouth daily.    . digoxin (LANOXIN) 0.125 MG tablet Take 0.125 mg by mouth daily.    . fish oil-omega-3 fatty acids 1000 MG capsule Take 1-2 g by mouth daily. 2 morning, 1 at night    . furosemide (LASIX) 20 MG tablet Take 1 tablet (20 mg total) by mouth daily. My take 40 mg by mouth daily as needed for swelling. 180 tablet 3  . montelukast (SINGULAIR) 10 MG tablet Take 10 mg by mouth at bedtime.    Marland Kitchen esomeprazole (NEXIUM) 20 MG capsule Take 40 mg by mouth daily. Reported on 04/29/2015    . Fluticasone-Salmeterol (ADVAIR) 100-50 MCG/DOSE AEPB Inhale 1 puff into the lungs  daily. Reported on 04/29/2015    . Multiple Vitamins-Minerals (MEGA MULTI MEN PO) Take by mouth. Reported on 04/29/2015     No facility-administered medications prior to visit.     Allergies:   Penicillins and Sulfa antibiotics   Social History   Social History  . Marital Status: Married    Spouse Name: N/A  . Number of Children: N/A  . Years of Education: N/A   Social History Main Topics  . Smoking status: Former Smoker    Quit date: 02/20/1998  . Smokeless tobacco: None  . Alcohol Use: 0.0 oz/week    0 Standard drinks or equivalent per week     Comment: has quit drinking x 1 1/2 months. He sporadically drinks  . Drug Use: No  . Sexual Activity: Yes   Other Topics Concern  . None   Social History Narrative     Family History:  The patient's  family history includes Cancer - Prostate in his maternal grandfather; Hypertension in his father and mother.   ROS:   Please see the history of present illness.    Review of Systems  Constitution: Positive for decreased appetite, malaise/fatigue and weight gain.  Cardiovascular: Positive for dyspnea on exertion, irregular heartbeat and leg swelling.  Respiratory: Positive for cough and shortness of breath.   Musculoskeletal: Positive for back pain.  Genitourinary: Positive for incomplete emptying.  Neurological: Positive for loss of balance.  Psychiatric/Behavioral: The patient is nervous/anxious.   All other systems reviewed and are negative.   Physical Exam:    VS:  BP 111/87 mmHg  Pulse 100  Ht 5\' 11"  (1.803 m)  Wt 187 lb 1.9 oz (84.877 kg)  BMI 26.11 kg/m2  SpO2 97%   GEN: Well nourished, well developed, in no acute distress HEENT: normal Neck: no JVD, no masses Cardiac: Normal S1/S2, RRR; no murmurs   Respiratory:  Decreased breath sounds bilateral bases, no wheezing, no rales, question egophony bilateral bases GI: distended MS: no deformity or atrophy Skin: warm and dry, no rash Neuro:  no focal deficits  Psych: Alert and oriented x 3, normal affect  Wt Readings from Last 3 Encounters:  04/29/15 187 lb 1.9 oz (84.877 kg)  03/31/15 179 lb (81.194 kg)  03/17/15 178 lb 1.6 oz (80.786 kg)      Studies/Labs Reviewed:    EKG:  EKG is  ordered today.  The ekg ordered today demonstrates NSR, HR 99, low voltage, septal Q waves, IVCD, QTc 446 ms, no significant change when compared to prior tracings  Recent Labs: 03/09/2015: BUN 19; Creat 0.85; Potassium 4.1; Sodium 138 03/17/2015: ALT 45   Recent Lipid Panel No results found for: CHOL, TRIG, HDL, CHOLHDL, VLDL, LDLCALC, LDLDIRECT  Additional studies/ records that were reviewed today include:   Echo 03/18/15 EF 20-25%, diff HK, Gr 2 DD, mod MR, mod LAE, mod reduced RVSF, mild RAE  LHC 2/02 Normal coronary  arteries EF 10%  ASSESSMENT:    1. Acute on chronic systolic CHF (congestive heart failure) ([place])   2. Nonischemic cardiomyopathy ([place])   3. ICD - Medtronic Maximo II VR 2009     PLAN:    Acute on chronic systolic CHF - Patient presents with Class 3-3b CHF.  His abdomen is distended and he may have bilateral pleural effusions based upon my exam.  He is hypotensive and likely has some low output HF.  I have recommended admission to the hospital for further management.  I discussed this with Dr. Elta Guadeloupe  Skains who agreed.    -  Admit to [facility]  -  Start IV Lasix 40 mg BID  -  DVT dose Lovenox  -  Obtain CXR, BMET, LFTs, CBC, Mg2+, BNP, Dig level , TSH  -  Continue Coreg, Dig  -  Attempt to resume angiotensin receptor blocker at some point  -  Consider addition of Entresto as well  -  He may require Dopamine or Milrinone   -  May need HF Team to see as well    Medication Adjustments/Labs and Tests Ordered: Current medicines are reviewed at length with the patient today.  Concerns regarding medicines are outlined above.  Medication changes, Labs and Tests ordered today are outlined in the Patient Instructions noted below. Patient Instructions  YOU HAVE  BEEN ADMITTED TO [facility] TO UNIT 3 EAST      Signed, Richardson Dopp, PA-C  04/29/2015 10:14 AM    [facility] [address], [place], [place]  [address] Phone: [phone]; Fax: [phone]

## 2015-04-29 NOTE — Patient Instructions (Addendum)
YOU HAVE  BEEN ADMITTED TO [facility] TO UNIT 3 EAST

## 2015-04-30 ENCOUNTER — Inpatient Hospital Stay (HOSPITAL_COMMUNITY): Payer: Medicare Other

## 2015-04-30 LAB — BASIC METABOLIC PANEL
Anion gap: 12 (ref 5–15)
BUN: 19 mg/dL (ref 6–20)
CO2: 29 mmol/L (ref 22–32)
Calcium: 9 mg/dL (ref 8.9–10.3)
Chloride: 96 mmol/L — ABNORMAL LOW (ref 101–111)
Creatinine, Ser: 1.29 mg/dL — ABNORMAL HIGH (ref 0.61–1.24)
GFR calc Af Amer: >60 mL/min (ref >60)
GFR calc non Af Amer: >60 mL/min (ref >60)
Glucose, Bld: 95 mg/dL (ref 65–99)
Potassium: 4 mmol/L (ref 3.5–5.1)
Sodium: 137 mmol/L (ref 135–145)

## 2015-04-30 NOTE — Progress Notes (Signed)
Patient Name: Patrick Humphrey Date of Encounter: 04/30/2015  Principal Problem:   Acute on chronic systolic CHF (congestive heart failure) ([place]) Active Problems:   Nonischemic cardiomyopathy ([place])   ICD - Medtronic Maximo II VR 2009   Length of Stay: 1  SUBJECTIVE  A little better. Each furosemide dose only leads to "one good pee".  CURRENT MEDS . aspirin EC  81 mg Oral Daily  . carvedilol  12.5 mg Oral BID WC  . digoxin  0.125 mg Oral Daily  . enoxaparin (LOVENOX) injection  40 mg Subcutaneous Daily  . furosemide  40 mg Intravenous BID  . loratadine  10 mg Oral Daily  . montelukast  10 mg Oral QHS  . pantoprazole  40 mg Oral Daily  . potassium chloride  10 mEq Oral BID  . sodium chloride  3 mL Intravenous Q12H    OBJECTIVE   Intake/Output Summary (Last 24 hours) at 04/30/15 1123 Last data filed at 04/30/15 1100  Gross per 24 hour  Intake    460 ml  Output    900 ml  Net   -440 ml   Filed Weights   04/29/15 1209  Weight: 179 lb 0.2 oz (81.2 kg)    PHYSICAL EXAM Filed Vitals:   04/29/15 1209 04/29/15 1733 04/29/15 2002 04/30/15 0541  BP:  96/81 101/81 100/75  Pulse:  88 101 81  Temp:   98.2 F (36.8 C) 97.9 F (36.6 C)  TempSrc:   Oral Oral  Resp:   18 18  Height: 5\' 11"  (1.803 m)     Weight: 179 lb 0.2 oz (81.2 kg)     SpO2:   100%    General: Alert, oriented x3, no distress Head: no evidence of trauma, PERRL, EOMI, no exophtalmos or lid lag, no myxedema, no xanthelasma; normal ears, nose and oropharynx Neck: normal jugular venous pulsations and no hepatojugular reflux; brisk carotid pulses without delay and no carotid bruits Chest: a few right base crackles, otherwise clear to auscultation, no signs of consolidation by percussion or palpation, normal fremitus, symmetrical and full respiratory excursions, healthy Rt subclavian ICD site Cardiovascular: normal position and quality of the apical impulse, regular rhythm, normal first and second heart  sounds, no rubs +ve S3 gallop, no murmur Abdomen: no tenderness or distention, no masses by palpation, no abnormal pulsatility or arterial bruits, normal bowel sounds, no hepatosplenomegaly Extremities: no clubbing, cyanosis or edema; 2+ radial, ulnar and brachial pulses bilaterally; 2+ right femoral, posterior tibial and dorsalis pedis pulses; 2+ left femoral, posterior tibial and dorsalis pedis pulses; no subclavian or femoral bruits Neurological: grossly nonfocal  LABS  CBC  Recent Labs  04/29/15 1650  WBC 7.7  NEUTROABS 4.7  HGB 15.5  HCT 44.1  MCV 95.2  PLT 123XX123   Basic Metabolic Panel  Recent Labs  04/29/15 1650 04/30/15 0338  NA 136 137  K 3.8 4.0  CL 96* 96*  CO2 25 29  GLUCOSE 115* 95  BUN 20 19  CREATININE 1.21 1.29*  CALCIUM 9.2 9.0  MG 1.5*  --    Liver Function Tests  Recent Labs  04/29/15 1650  AST 73*  ALT 50  ALKPHOS 103  BILITOT 4.2*  PROT 7.0  ALBUMIN 3.3*     Recent Labs  04/29/15 1650  TSH 4.135    Radiology Studies Imaging results have been reviewed and Dg Chest 2 View  04/30/2015  CLINICAL DATA:  Cough, allergy induced asthma, history of pacemaker, CHF, nonsmoker. EXAM:  CHEST  2 VIEW COMPARISON:  Chest x-rays dated 04/23/2015 and 01/06/2015. FINDINGS: New airspace opacity is present at the right lung base. Suspect small adjacent right pleural effusion. Chronic mild atelectasis/scarring noted at the left lung base. Mild cardiomegaly is unchanged. Right chest wall pacemaker/AICD is stable in position. No acute osseous abnormality seen. IMPRESSION: 1. New airspace opacity at the right lung base. This could represent atelectasis, pneumonia or aspiration. If febrile, would certainly favor pneumonia. Suspect small adjacent right pleural effusion. 2. Cardiomegaly, stable. No evidence of significant volume overload/CHF. Electronically Signed   By: Franki Cabot M.D.   On: 04/30/2015 08:59    TELE SR, frequent PVCs, occasional brief NSVT, max 5  beats  ECG SR, incomplete LBBB  ASSESSMENT AND PLAN  Acute on chronic systolic and diastolic HF, with symptoms of both low cardiac output and congestion. Improving after diuresis. Wide swings in volume status and weight related to esophageal problems in the fall, steroid therapy for rash in Nov-Dec, recent 2L IV fluid administration for hypotension in ED. Losartan stopped at that time. Need to reestablish "dry weight". Continue IV furosemide. Restart losartan tomorrow if BP allows. ICD interrogation in November shows gradually declining activity since August. His device does not have Optivol. He seems to have progressive CHF and will benefit from CHF service referral. May need LVAD/OHT therapies in the future.   Sanda Klein, MD, [facility] [phone] office [phone] pager 04/30/2015 11:23 AM

## 2015-05-01 LAB — BASIC METABOLIC PANEL
Anion gap: 11 (ref 5–15)
BUN: 19 mg/dL (ref 6–20)
CO2: 29 mmol/L (ref 22–32)
Calcium: 8.8 mg/dL — ABNORMAL LOW (ref 8.9–10.3)
Chloride: 96 mmol/L — ABNORMAL LOW (ref 101–111)
Creatinine, Ser: 1.22 mg/dL (ref 0.61–1.24)
GFR calc Af Amer: >60 mL/min (ref >60)
GFR calc non Af Amer: >60 mL/min (ref >60)
Glucose, Bld: 106 mg/dL — ABNORMAL HIGH (ref 65–99)
Potassium: 3.8 mmol/L (ref 3.5–5.1)
Sodium: 136 mmol/L (ref 135–145)

## 2015-05-01 LAB — CARBOXYHEMOGLOBIN
Carboxyhemoglobin: 1 % (ref 0.5–1.5)
Methemoglobin: 0.7 % (ref 0.0–1.5)
O2 Saturation: 44.4 %
Total hemoglobin: 14.4 g/dL (ref 13.5–18.0)

## 2015-05-01 LAB — MRSA PCR SCREENING: MRSA by PCR: NEGATIVE

## 2015-05-01 MED ORDER — MILRINONE IN DEXTROSE 20 MG/100ML IV SOLN
0.2500 ug/kg/min | INTRAVENOUS | Status: DC
  Administered 2015-05-01 – 2015-05-03 (×4): 0.25 ug/kg/min via INTRAVENOUS
  Filled 2015-05-01 (×4): qty 100

## 2015-05-01 MED ORDER — POTASSIUM CHLORIDE CRYS ER 20 MEQ PO TBCR
20.0000 meq | EXTENDED_RELEASE_TABLET | Freq: Two times a day (BID) | ORAL | Status: DC
  Administered 2015-05-01 (×2): 20 meq via ORAL
  Filled 2015-05-01 (×2): qty 1

## 2015-05-01 MED ORDER — FUROSEMIDE 10 MG/ML IJ SOLN
80.0000 mg | Freq: Two times a day (BID) | INTRAMUSCULAR | Status: AC
  Administered 2015-05-01 – 2015-05-03 (×6): 80 mg via INTRAVENOUS
  Filled 2015-05-01 (×6): qty 8

## 2015-05-01 MED ORDER — SODIUM CHLORIDE 0.9 % IJ SOLN
10.0000 mL | Freq: Two times a day (BID) | INTRAMUSCULAR | Status: DC
  Administered 2015-05-01 – 2015-05-09 (×9): 10 mL

## 2015-05-01 MED ORDER — SODIUM CHLORIDE 0.9 % IJ SOLN: 10.0000 mL | INTRAMUSCULAR | Status: DC | PRN

## 2015-05-01 MED ORDER — CARVEDILOL 3.125 MG PO TABS
3.1250 mg | ORAL_TABLET | Freq: Two times a day (BID) | ORAL | Status: DC
  Administered 2015-05-01 – 2015-05-05 (×9): 3.125 mg via ORAL
  Filled 2015-05-01 (×10): qty 1

## 2015-05-01 NOTE — Progress Notes (Signed)
Report called to [facility] re. pt tx to [id number].  Pt made aware. Karie Kirks, Therapist, sports.

## 2015-05-01 NOTE — Progress Notes (Signed)
Peripherally Inserted Central Catheter/Midline Placement  The IV Nurse has discussed with the patient and/or persons authorized to consent for the patient, the purpose of this procedure and the potential benefits and risks involved with this procedure.  The benefits include less needle sticks, lab draws from the catheter and patient may be discharged home with the catheter.  Risks include, but not limited to, infection, bleeding, blood clot (thrombus formation), and puncture of an artery; nerve damage and irregular heat beat.  Alternatives to this procedure were also discussed.  PICC/Midline Placement Documentation  PICC Double Lumen [id number] PICC Left Basilic 48 cm 2 cm (Active)  Indication for Insertion or Continuance of Line Vasoactive infusions;Prolonged intravenous therapies 05/01/2015  1:00 PM  Exposed Catheter (cm) 2 cm 05/01/2015  1:00 PM  Site Assessment Clean;Dry;Intact 05/01/2015  1:00 PM  Lumen #1 Status Flushed;Saline locked;Blood return noted 05/01/2015  1:00 PM  Lumen #2 Status Flushed;Saline locked;Blood return noted 05/01/2015  1:00 PM  Dressing Type Transparent 05/01/2015  1:00 PM  Dressing Status Clean;Dry;Intact;Antimicrobial disc in place 05/01/2015  1:00 PM  Line Care Connections checked and tightened 05/01/2015  1:00 PM  Line Adjustment (NICU/IV Team Only) No 05/01/2015  1:00 PM  Dressing Intervention New dressing 05/01/2015  1:00 PM  Dressing Change Due 05/08/15 05/01/2015  1:00 PM       Rolena Infante 05/01/2015, 1:23 PM

## 2015-05-01 NOTE — Progress Notes (Signed)
Notified D. Dunn,PA that no bed in 2H as ordered.  Have a bed in 2C and 3S.  Instructed that Dr Aundra Dubin  is ok to have a bed in 3S for pt.  Karie Kirks, Therapist, sports.

## 2015-05-01 NOTE — Progress Notes (Signed)
Pt received 54meq of k in addition to 84meq given earlier.  Dose changed from 10 to 69meq notified pharmacy of dose change.instructed by Janett Billow Pharmacist ok to give k15meq to equal 30.meq. As K level is 3.8   Explained to pt and pt was ok with dose.  Karie Kirks, Therapist, sports.

## 2015-05-01 NOTE — Progress Notes (Signed)
Notified by Langley Gauss, rapid response nurse that there is no be in [place] only in [place] and 3S.  Notified Dr. Sallyanne Kuster and instructed that it will be ok to wait for a bed for little while in [place] . Notified pt placement Marg[facility]  And she instructed that a bed will not be available soon.  Karie Kirks, Therapist, sports.

## 2015-05-01 NOTE — Progress Notes (Signed)
Pharmacist Heart Failure Core Measure Documentation  Assessment: Patrick Humphrey has an EF documented as 20-25% on 03/18/15 by ECHO.  Rationale: Heart failure patients with left ventricular systolic dysfunction (LVSD) and an EF < 40% should be prescribed an angiotensin converting enzyme inhibitor (ACEI) or angiotensin receptor blocker (ARB) at discharge unless a contraindication is documented in the medical record.  This patient is not currently on an ACEI or ARB for HF.  This note is being placed in the record in order to provide documentation that a contraindication to the use of these agents is present for this encounter.  ACE Inhibitor or Angiotensin Receptor Blocker is contraindicated (specify all that apply)  []   ACEI allergy AND ARB allergy []   Angioedema []   Moderate or severe aortic stenosis []   Hyperkalemia [x]   Hypotension []   Renal artery stenosis []   Worsening renal function, preexisting renal disease or dysfunction   Patrick Humphrey 05/01/2015 9:21 AM

## 2015-05-01 NOTE — Progress Notes (Addendum)
Patient ID: Patrick Humphrey, male   DOB: 23-May-1965, 50 y.o.   MRN: [id number]   SUBJECTIVE: Patient did not diurese much yesterday.  SBP remains low.  He has dyspnea walking in the hall and continues to have orthopnea.   Scheduled Meds: . aspirin EC  81 mg Oral Daily  . carvedilol  3.125 mg Oral BID WC  . digoxin  0.125 mg Oral Daily  . enoxaparin (LOVENOX) injection  40 mg Subcutaneous Daily  . furosemide  80 mg Intravenous BID  . loratadine  10 mg Oral Daily  . montelukast  10 mg Oral QHS  . pantoprazole  40 mg Oral Daily  . potassium chloride  20 mEq Oral BID  . sodium chloride  3 mL Intravenous Q12H   Continuous Infusions:  PRN Meds:.sodium chloride, acetaminophen, ALPRAZolam, ondansetron (ZOFRAN) IV, sodium chloride    Filed Vitals:   04/30/15 1700 04/30/15 2111 05/01/15 0245 05/01/15 0524  BP: 103/78 83/57 93/77  85/60  Pulse: 85 80 81 83  Temp:  98.2 F (36.8 C) 97.4 F (36.3 C) 98.9 F (37.2 C)  TempSrc:  Oral Oral Oral  Resp: 20 18 20 16   Height:      Weight:    179 lb 6.4 oz (81.375 kg)  SpO2: 97% 97% 99% 97%    Intake/Output Summary (Last 24 hours) at 05/01/15 0943 Last data filed at 05/01/15 0600  Gross per 24 hour  Intake    480 ml  Output   1925 ml  Net  -1445 ml    LABS: Basic Metabolic Panel:  Recent Labs  04/29/15 1650 04/30/15 0338 05/01/15 0423  NA 136 137 136  K 3.8 4.0 3.8  CL 96* 96* 96*  CO2 25 29 29   GLUCOSE 115* 95 106*  BUN 20 19 19   CREATININE 1.21 1.29* 1.22  CALCIUM 9.2 9.0 8.8*  MG 1.5*  --   --    Liver Function Tests:  Recent Labs  04/29/15 1650  AST 73*  ALT 50  ALKPHOS 103  BILITOT 4.2*  PROT 7.0  ALBUMIN 3.3*   No results for input(s): LIPASE, AMYLASE in the last 72 hours. CBC:  Recent Labs  04/29/15 1650  WBC 7.7  NEUTROABS 4.7  HGB 15.5  HCT 44.1  MCV 95.2  PLT 187   Cardiac Enzymes: No results for input(s): CKTOTAL, CKMB, CKMBINDEX, TROPONINI in the last 72 hours. BNP: Invalid input(s):  POCBNP D-Dimer: No results for input(s): DDIMER in the last 72 hours. Hemoglobin A1C: No results for input(s): HGBA1C in the last 72 hours. Fasting Lipid Panel: No results for input(s): CHOL, HDL, LDLCALC, TRIG, CHOLHDL, LDLDIRECT in the last 72 hours. Thyroid Function Tests:  Recent Labs  04/29/15 1650  TSH 4.135   Anemia Panel: No results for input(s): VITAMINB12, FOLATE, FERRITIN, TIBC, IRON, RETICCTPCT in the last 72 hours.  RADIOLOGY: Dg Chest 2 View  04/30/2015  CLINICAL DATA:  Cough, allergy induced asthma, history of pacemaker, CHF, nonsmoker. EXAM: CHEST  2 VIEW COMPARISON:  Chest x-rays dated 04/23/2015 and 01/06/2015. FINDINGS: New airspace opacity is present at the right lung base. Suspect small adjacent right pleural effusion. Chronic mild atelectasis/scarring noted at the left lung base. Mild cardiomegaly is unchanged. Right chest wall pacemaker/AICD is stable in position. No acute osseous abnormality seen. IMPRESSION: 1. New airspace opacity at the right lung base. This could represent atelectasis, pneumonia or aspiration. If febrile, would certainly favor pneumonia. Suspect small adjacent right pleural effusion. 2. Cardiomegaly, stable. No  evidence of significant volume overload/CHF. Electronically Signed   By: Franki Cabot M.D.   On: 04/30/2015 08:59    PHYSICAL EXAM General: NAD Neck: JVP 16 cm, no thyromegaly or thyroid nodule.  Lungs: Clear to auscultation bilaterally with normal respiratory effort. CV: Lateral PMI.  Heart regular S1/S2, ?soft S3, no murmur.  No peripheral edema.   Abdomen: Soft, nontender, no hepatosplenomegaly.  Mild distention Neurologic: Alert and oriented x 3.  Psych: Normal affect. Extremities: No clubbing or cyanosis.   TELEMETRY: Reviewed telemetry pt in NSR  ASSESSMENT AND PLAN: 50 yo with history of nonischemic cardiomyopathy since 2002 was admitted with volume overload and exertional dyspnea.   1. Acute on chronic systolic CHF:  Nonischemic cardiomyopathy since 2002.  Echo (12/16) with EF 20-25%, moderate MR, moderately decreased RV systolic function.  Cath in 2002 at time of diagnosis with normal coronaries.  Has Medtronic ICD.  He has had a gradual decline over the last 6 months.  His cardiac meds have had to be cut back due to hypotension.  SBP in 80s-90s here, poor urine output. Creatinine remains stable.  I am concerned for low output HF.  He is profoundly fatigued with exertion. On exam he is volume overloaded.   - Place PICC, check CVP and co-ox => will move to 2H stepdown.  - If co-ox low (I suspect it will be), will start milrinone 0.25.  - Increase Lasix for now to 80 mg IV bid. - Cut back on Coreg to 3.125 mg bid. - Continue digoxin, check level in am.  - Will likely need formal RHC this admission, will do after diuresis.  - We discussed the possible need for advanced therapies down the road.  I think he would be a potential transplant candidate.  He would also be an LVAD candidate if needed.  2. Elevated LFTs: Mild, noted at admission.  Suspect due to congestive hepatopathy.   [name] 05/01/2015 9:50 AM

## 2015-05-02 ENCOUNTER — Encounter (HOSPITAL_COMMUNITY): Payer: Self-pay | Admitting: *Deleted

## 2015-05-02 LAB — CARBOXYHEMOGLOBIN
Carboxyhemoglobin: 1.3 % (ref 0.5–1.5)
Methemoglobin: 0.8 % (ref 0.0–1.5)
O2 Saturation: 58.3 %
Total hemoglobin: 12.2 g/dL — ABNORMAL LOW (ref 13.5–18.0)

## 2015-05-02 LAB — BASIC METABOLIC PANEL
Anion gap: 10 (ref 5–15)
BUN: 18 mg/dL (ref 6–20)
CO2: 30 mmol/L (ref 22–32)
Calcium: 8.5 mg/dL — ABNORMAL LOW (ref 8.9–10.3)
Chloride: 97 mmol/L — ABNORMAL LOW (ref 101–111)
Creatinine, Ser: 1.16 mg/dL (ref 0.61–1.24)
GFR calc Af Amer: >60 mL/min (ref >60)
GFR calc non Af Amer: >60 mL/min (ref >60)
Glucose, Bld: 106 mg/dL — ABNORMAL HIGH (ref 65–99)
Potassium: 3.6 mmol/L (ref 3.5–5.1)
Sodium: 137 mmol/L (ref 135–145)

## 2015-05-02 LAB — MAGNESIUM: Magnesium: 1.5 mg/dL — ABNORMAL LOW (ref 1.7–2.4)

## 2015-05-02 LAB — CBC
HCT: 39.5 % (ref 39.0–52.0)
Hemoglobin: 13.8 g/dL (ref 13.0–17.0)
MCH: 33.3 pg (ref 26.0–34.0)
MCHC: 34.9 g/dL (ref 30.0–36.0)
MCV: 95.2 fL (ref 78.0–100.0)
Platelets: 172 10*3/uL (ref 150–400)
RBC: 4.15 MIL/uL — ABNORMAL LOW (ref 4.22–5.81)
RDW: 16.3 % — ABNORMAL HIGH (ref 11.5–15.5)
WBC: 7.5 10*3/uL (ref 4.0–10.5)

## 2015-05-02 LAB — DIGOXIN LEVEL: Digoxin Level: 0.7 ng/mL — ABNORMAL LOW (ref 0.8–2.0)

## 2015-05-02 MED ORDER — MAGNESIUM SULFATE 2 GM/50ML IV SOLN
2.0000 g | Freq: Once | INTRAVENOUS | Status: AC
  Administered 2015-05-02: 2 g via INTRAVENOUS
  Filled 2015-05-02: qty 50

## 2015-05-02 MED ORDER — METOLAZONE 2.5 MG PO TABS
2.5000 mg | ORAL_TABLET | Freq: Once | ORAL | Status: AC
  Administered 2015-05-02: 2.5 mg via ORAL
  Filled 2015-05-02: qty 1

## 2015-05-02 MED ORDER — METOLAZONE 2.5 MG PO TABS: 2.5000 mg | ORAL_TABLET | Freq: Once | ORAL | Status: DC

## 2015-05-02 MED ORDER — POTASSIUM CHLORIDE CRYS ER 20 MEQ PO TBCR
40.0000 meq | EXTENDED_RELEASE_TABLET | Freq: Two times a day (BID) | ORAL | Status: DC
  Administered 2015-05-02 – 2015-05-03 (×4): 40 meq via ORAL
  Filled 2015-05-02 (×4): qty 2

## 2015-05-02 MED ORDER — ZOLPIDEM TARTRATE 5 MG PO TABS
5.0000 mg | ORAL_TABLET | Freq: Every evening | ORAL | Status: DC | PRN
  Administered 2015-05-02 – 2015-05-09 (×7): 5 mg via ORAL
  Filled 2015-05-02 (×7): qty 1

## 2015-05-02 NOTE — Progress Notes (Signed)
CARDIAC REHAB PHASE I   Pt up in chair, states he ambulated 4 laps around the unit this morning independently with no complaints. Will follow-up this afternoon as schedule permits for additional ambulation/education.   Lenna Sciara, RN, BSN 05/02/2015 11:02 AM

## 2015-05-02 NOTE — Progress Notes (Signed)
Patient ID: Patrick Humphrey, male   DOB: [birth year], 50 y.o.   MRN: [id number]   SUBJECTIVE:   Admitted with marked volume overload. Diuresed with IV lasix and started on 0.25 mcg milrinone. CO-OX up from 44% to 58%.   Denies SOB/Orthopnea.   Scheduled Meds: . aspirin EC  81 mg Oral Daily  . carvedilol  3.125 mg Oral BID WC  . digoxin  0.125 mg Oral Daily  . enoxaparin (LOVENOX) injection  40 mg Subcutaneous Daily  . furosemide  80 mg Intravenous BID  . loratadine  10 mg Oral Daily  . montelukast  10 mg Oral QHS  . pantoprazole  40 mg Oral Daily  . potassium chloride  20 mEq Oral BID  . sodium chloride  10-40 mL Intracatheter Q12H  . sodium chloride  3 mL Intravenous Q12H   Continuous Infusions: . milrinone 0.25 mcg/kg/min (05/02/15 0333)   PRN Meds:.sodium chloride, acetaminophen, ALPRAZolam, ondansetron (ZOFRAN) IV, sodium chloride, sodium chloride    Filed Vitals:   05/01/15 2050 05/02/15 0000 05/02/15 0400 05/02/15 0430  BP:  92/77 97/78   Pulse:      Temp: 98.1 F (36.7 C) 97.7 F (36.5 C)  98.4 F (36.9 C)  TempSrc: Oral Oral  Oral  Resp:  16 12   Height:      Weight:    176 lb 14.4 oz (80.241 kg)  SpO2:  98%      Intake/Output Summary (Last 24 hours) at 05/02/15 0743 Last data filed at 05/02/15 0700  Gross per 24 hour  Intake  391.5 ml  Output   1300 ml  Net -908.5 ml    LABS: Basic Metabolic Panel:  Recent Labs  04/29/15 1650  05/01/15 0423 05/02/15 0512  NA 136  < > 136 137  K 3.8  < > 3.8 3.6  CL 96*  < > 96* 97*  CO2 25  < > 29 30  GLUCOSE 115*  < > 106* 106*  BUN 20  < > 19 18  CREATININE 1.21  < > 1.22 1.16  CALCIUM 9.2  < > 8.8* 8.5*  MG 1.5*  --   --   --   < > = values in this interval not displayed. Liver Function Tests:  Recent Labs  04/29/15 1650  AST 73*  ALT 50  ALKPHOS 103  BILITOT 4.2*  PROT 7.0  ALBUMIN 3.3*   No results for input(s): LIPASE, AMYLASE in the last 72 hours. CBC:  Recent Labs  04/29/15 1650  05/02/15 0512  WBC 7.7 7.5  NEUTROABS 4.7  --   HGB 15.5 13.8  HCT 44.1 39.5  MCV 95.2 95.2  PLT 187 172   Cardiac Enzymes: No results for input(s): CKTOTAL, CKMB, CKMBINDEX, TROPONINI in the last 72 hours. BNP: Invalid input(s): POCBNP D-Dimer: No results for input(s): DDIMER in the last 72 hours. Hemoglobin A1C: No results for input(s): HGBA1C in the last 72 hours. Fasting Lipid Panel: No results for input(s): CHOL, HDL, LDLCALC, TRIG, CHOLHDL, LDLDIRECT in the last 72 hours. Thyroid Function Tests:  Recent Labs  04/29/15 1650  TSH 4.135   Anemia Panel: No results for input(s): VITAMINB12, FOLATE, FERRITIN, TIBC, IRON, RETICCTPCT in the last 72 hours.  RADIOLOGY: Dg Chest 2 View  04/30/2015  CLINICAL DATA:  Cough, allergy induced asthma, history of pacemaker, CHF, nonsmoker. EXAM: CHEST  2 VIEW COMPARISON:  Chest x-rays dated 04/23/2015 and 01/06/2015. FINDINGS: New airspace opacity is present at the right lung base.  Suspect small adjacent right pleural effusion. Chronic mild atelectasis/scarring noted at the left lung base. Mild cardiomegaly is unchanged. Right chest wall pacemaker/AICD is stable in position. No acute osseous abnormality seen. IMPRESSION: 1. New airspace opacity at the right lung base. This could represent atelectasis, pneumonia or aspiration. If febrile, would certainly favor pneumonia. Suspect small adjacent right pleural effusion. 2. Cardiomegaly, stable. No evidence of significant volume overload/CHF. Electronically Signed   By: Franki Cabot M.D.   On: 04/30/2015 08:59    PHYSICAL EXAM CVP 12 General: NAD Neck: JVP to  jaw, no thyromegaly or thyroid nodule.  Lungs: Clear to auscultation bilaterally with normal respiratory effort. CV: Lateral PMI.  Heart regular S1/S2, ?soft S3, no murmur.  No peripheral edema.   Abdomen: Soft, nontender, no hepatosplenomegaly.  Mild distention Neurologic: Alert and oriented x 3.  Psych: Normal affect. Extremities:  No clubbing or cyanosis. RUE PICC  TELEMETRY: NSR with PVCs. 90s  ASSESSMENT AND PLAN: 50 yo with history of nonischemic cardiomyopathy since 2002 was admitted with volume overload and exertional dyspnea.   1. Acute on chronic systolic CHF: Nonischemic cardiomyopathy since 2002.  Echo (12/16) with EF 20-25%, moderate MR, moderately decreased RV systolic function.  Cath in 2002 at time of diagnosis with normal coronaries.  Has Medtronic ICD.    - CO-OX 58% on milrinone 0.25 mcg.  - Continue  Lasix  80 mg IV bid.twice a day and add 2.5 mg metolazone. Increase potassium to 40 meq twice a day.  - Continue Coreg to 3.125 mg bid. - Continue digoxin, level  0.7   - No room for Ace/Arb due to hypotension.  - RHC this admission, will do after diuresis.  - Consult cardiac rehab.  - We discussed the possible need for advanced therapies down the road.  I think he would be a potential transplant candidate.  He would also be an LVAD candidate if needed.  2. Elevated LFTs: Mild, noted at admission.  Suspect due to congestive hepatopathy.  3. Hypomagnesium- Mag 1.5 Give 2 grams Mag   Amy Clegg NP-C  05/02/2015 7:43 AM  Patient seen with NP, agree with the above note.  He feels considerably better on milrinone with improved output.  CVP remains elevated at 12, will add metolazone today to IV Lasix for diuresis.  I am concerned that he is going to need advanced therapies, will discuss with LVAD team.  Long-term, I think that he would be a transplant candidate.   Patrick Humphrey 05/02/2015 8:18 AM

## 2015-05-02 NOTE — Progress Notes (Signed)
Pt declines ambulating at this time as he is exhausted from company and lack of sleep. Discussed low sodium with pt and gave diet sheets. Encouraged him to find the HF booklet and we will discuss later as another visitor came. Encouraged pt to think about asking visitors to give him some rest time here in the hospital. Pt very receptive. Also have HF video to watch. Will f/u tomorrow. Encouraged him to walk more. [place], ACSM 1:53 PM 05/02/2015

## 2015-05-03 DIAGNOSIS — I5023 Acute on chronic systolic (congestive) heart failure: Secondary | ICD-10-CM

## 2015-05-03 LAB — CBC
HCT: 40.5 % (ref 39.0–52.0)
Hemoglobin: 14.1 g/dL (ref 13.0–17.0)
MCH: 33 pg (ref 26.0–34.0)
MCHC: 34.8 g/dL (ref 30.0–36.0)
MCV: 94.8 fL (ref 78.0–100.0)
Platelets: 182 10*3/uL (ref 150–400)
RBC: 4.27 MIL/uL (ref 4.22–5.81)
RDW: 16.2 % — ABNORMAL HIGH (ref 11.5–15.5)
WBC: 6.4 10*3/uL (ref 4.0–10.5)

## 2015-05-03 LAB — MAGNESIUM: Magnesium: 1.8 mg/dL (ref 1.7–2.4)

## 2015-05-03 LAB — BASIC METABOLIC PANEL
Anion gap: 11 (ref 5–15)
BUN: 16 mg/dL (ref 6–20)
CO2: 31 mmol/L (ref 22–32)
Calcium: 9 mg/dL (ref 8.9–10.3)
Chloride: 94 mmol/L — ABNORMAL LOW (ref 101–111)
Creatinine, Ser: 1.08 mg/dL (ref 0.61–1.24)
GFR calc Af Amer: >60 mL/min (ref >60)
GFR calc non Af Amer: >60 mL/min (ref >60)
Glucose, Bld: 114 mg/dL — ABNORMAL HIGH (ref 65–99)
Potassium: 3.8 mmol/L (ref 3.5–5.1)
Sodium: 136 mmol/L (ref 135–145)

## 2015-05-03 LAB — CARBOXYHEMOGLOBIN
Carboxyhemoglobin: 1.3 % (ref 0.5–1.5)
Methemoglobin: 0.6 % (ref 0.0–1.5)
O2 Saturation: 59.3 %
Total hemoglobin: 14.4 g/dL (ref 13.5–18.0)

## 2015-05-03 MED ORDER — SODIUM CHLORIDE 0.9 % IV SOLN
INTRAVENOUS | Status: DC
  Administered 2015-05-04: 05:00:00 via INTRAVENOUS

## 2015-05-03 MED ORDER — MAGNESIUM OXIDE 400 (241.3 MG) MG PO TABS
400.0000 mg | ORAL_TABLET | Freq: Every day | ORAL | Status: DC
  Administered 2015-05-03: 400 mg via ORAL
  Filled 2015-05-03 (×2): qty 1

## 2015-05-03 MED ORDER — SODIUM CHLORIDE 0.9% FLUSH
3.0000 mL | INTRAVENOUS | Status: DC | PRN
  Filled 2015-05-03: qty 3

## 2015-05-03 MED ORDER — SODIUM CHLORIDE 0.9% FLUSH
3.0000 mL | Freq: Two times a day (BID) | INTRAVENOUS | Status: DC
  Administered 2015-05-03: 3 mL via INTRAVENOUS
  Filled 2015-05-03 (×3): qty 3

## 2015-05-03 MED ORDER — POTASSIUM CHLORIDE CRYS ER 20 MEQ PO TBCR
40.0000 meq | EXTENDED_RELEASE_TABLET | Freq: Once | ORAL | Status: AC
  Administered 2015-05-03: 40 meq via ORAL
  Filled 2015-05-03: qty 2

## 2015-05-03 MED ORDER — SODIUM CHLORIDE 0.9 % IV SOLN: 250.0000 mL | INTRAVENOUS | Status: DC | PRN

## 2015-05-03 MED ORDER — ASPIRIN 81 MG PO CHEW
81.0000 mg | CHEWABLE_TABLET | ORAL | Status: AC
  Administered 2015-05-04: 81 mg via ORAL
  Filled 2015-05-03: qty 1

## 2015-05-03 MED ORDER — MAGNESIUM SULFATE 2 GM/50ML IV SOLN
2.0000 g | Freq: Once | INTRAVENOUS | Status: AC
  Administered 2015-05-03: 2 g via INTRAVENOUS
  Filled 2015-05-03: qty 50

## 2015-05-03 MED ORDER — METOLAZONE 2.5 MG PO TABS
2.5000 mg | ORAL_TABLET | Freq: Once | ORAL | Status: AC
  Administered 2015-05-03: 2.5 mg via ORAL
  Filled 2015-05-03: qty 1

## 2015-05-03 NOTE — Progress Notes (Signed)
Patient ID: Patrick Humphrey, male   DOB: 1965-10-13, 50 y.o.   MRN: [id number]   SUBJECTIVE:   Admitted with marked volume overload. Diuresed with IV lasix and started on 0.25 mcg milrinone.   Yesterday he was diuresed with IV lasix +metolazone. Weight down 5 pounds.   Denies SOB/Orthopnea.   Scheduled Meds: . aspirin EC  81 mg Oral Daily  . carvedilol  3.125 mg Oral BID WC  . digoxin  0.125 mg Oral Daily  . enoxaparin (LOVENOX) injection  40 mg Subcutaneous Daily  . furosemide  80 mg Intravenous BID  . loratadine  10 mg Oral Daily  . montelukast  10 mg Oral QHS  . pantoprazole  40 mg Oral Daily  . potassium chloride  40 mEq Oral BID  . sodium chloride  10-40 mL Intracatheter Q12H  . sodium chloride  3 mL Intravenous Q12H   Continuous Infusions: . milrinone 0.25 mcg/kg/min (05/02/15 2013)   PRN Meds:.sodium chloride, acetaminophen, ALPRAZolam, ondansetron (ZOFRAN) IV, sodium chloride, sodium chloride, zolpidem    Filed Vitals:   05/03/15 0025 05/03/15 0030 05/03/15 0345 05/03/15 0442  BP:  102/83 111/73   Pulse:  92    Temp: 98 F (36.7 C)   98.3 F (36.8 C)  TempSrc: Oral   Oral  Resp:      Height:      Weight:    171 lb 12.8 oz (77.928 kg)  SpO2: 94% 94% 95% 95%    Intake/Output Summary (Last 24 hours) at 05/03/15 0741 Last data filed at 05/03/15 0600  Gross per 24 hour  Intake 1191.9 ml  Output   2825 ml  Net -1633.1 ml    LABS: Basic Metabolic Panel:  Recent Labs  05/02/15 0512 05/03/15 0529  NA 137 136  K 3.6 3.8  CL 97* 94*  CO2 30 31  GLUCOSE 106* 114*  BUN 18 16  CREATININE 1.16 1.08  CALCIUM 8.5* 9.0  MG 1.5* 1.8   Liver Function Tests: No results for input(s): AST, ALT, ALKPHOS, BILITOT, PROT, ALBUMIN in the last 72 hours. No results for input(s): LIPASE, AMYLASE in the last 72 hours. CBC:  Recent Labs  05/02/15 0512 05/03/15 0529  WBC 7.5 6.4  HGB 13.8 14.1  HCT 39.5 40.5  MCV 95.2 94.8  PLT 172 182   Cardiac  Enzymes: No results for input(s): CKTOTAL, CKMB, CKMBINDEX, TROPONINI in the last 72 hours. BNP: Invalid input(s): POCBNP D-Dimer: No results for input(s): DDIMER in the last 72 hours. Hemoglobin A1C: No results for input(s): HGBA1C in the last 72 hours. Fasting Lipid Panel: No results for input(s): CHOL, HDL, LDLCALC, TRIG, CHOLHDL, LDLDIRECT in the last 72 hours. Thyroid Function Tests: No results for input(s): TSH, T4TOTAL, T3FREE, THYROIDAB in the last 72 hours.  Invalid input(s): FREET3 Anemia Panel: No results for input(s): VITAMINB12, FOLATE, FERRITIN, TIBC, IRON, RETICCTPCT in the last 72 hours.  RADIOLOGY: Dg Chest 2 View  04/30/2015  CLINICAL DATA:  Cough, allergy induced asthma, history of pacemaker, CHF, nonsmoker. EXAM: CHEST  2 VIEW COMPARISON:  Chest x-rays dated 04/23/2015 and 01/06/2015. FINDINGS: New airspace opacity is present at the right lung base. Suspect small adjacent right pleural effusion. Chronic mild atelectasis/scarring noted at the left lung base. Mild cardiomegaly is unchanged. Right chest wall pacemaker/AICD is stable in position. No acute osseous abnormality seen. IMPRESSION: 1. New airspace opacity at the right lung base. This could represent atelectasis, pneumonia or aspiration. If febrile, would certainly favor pneumonia. Suspect small  adjacent right pleural effusion. 2. Cardiomegaly, stable. No evidence of significant volume overload/CHF. Electronically Signed   By: Franki Cabot M.D.   On: 04/30/2015 08:59    PHYSICAL EXAM CVP ~11-12  General: NAD Neck: JVP 9-10  no thyromegaly or thyroid nodule.  Lungs: Clear to auscultation bilaterally with normal respiratory effort. CV: Lateral PMI.  Heart regular S1/S2, ?soft S3, no murmur.  No peripheral edema.   Abdomen: Soft, nontender, no hepatosplenomegaly.  Mild distention Neurologic: Alert and oriented x 3.  Psych: Normal affect. Extremities: No clubbing or cyanosis. RUE PICC  TELEMETRY: NSR with PVCs.  90-100s   ASSESSMENT AND PLAN: 50 yo with history of nonischemic cardiomyopathy since 2002 was admitted with volume overload and exertional dyspnea.   1. Acute on chronic systolic CHF: Nonischemic cardiomyopathy since 2002.  Echo (12/16) with EF 20-25%, moderate MR, moderately decreased RV systolic function.  Cath in 2002 at time of diagnosis with normal coronaries.  Has Medtronic ICD.   CO-OX 59% on milrinone 0.25 mcg. Stop milrinone at 0500 am.  CVP around 12 today.  - Continue  Lasix  80 mg IV bid twice a day and add 2.5 mg metolazone again today. Increase potassium to 40 meq  three times a day. Hold diuretics tomorrow morning pre-cath. - Continue Coreg to 3.125 mg bid. - Continue digoxin, level  0.7   - No room for Ace/Arb due to hypotension.  - RHC/LHC  Tomorrow at 8:30  .  - Consult cardiac rehab.  - We discussed the possible need for advanced therapies down the road. Potential transplant candidate.  He would also be an LVAD candidate if needed. VAD coordinators will meet with him today.  2. Elevated LFTs: Mild, noted at admission.  Suspect due to congestive hepatopathy.  3. Hypomagnesium- Mag 1.8  Give 2 grams Mag   Amy Clegg NP-C  05/03/2015 7:41 AM   Patient seen with NP, agree with the above note.  He diuresed well yesterday, weight down.  Feeling/breathing better.  Co-ox still somewhat marginal at 59%.  Still with volume overload on exam, CVP 12.    Plan [facility] tomorrow.  Will hold milrinone at 5 am for 8:30 am cath for numbers off milrinone.    I am concerned that he will need advanced therapies in the near future.  Will meet with LVAD coordinators today, have discussed at [facility].  [name] 05/03/2015 8:37 AM

## 2015-05-03 NOTE — Consult Note (Signed)
[address]       [place] [address]             [id number]        Patrick Humphrey Medical Record [id number] Date of Birth: [birth year]  Referring: Donnella Bi M.D. Primary Care:  Chief Complaint:   Shortness of breath, weakness  History of Present Illness:     Patient examined, echocardiogram performed last month and most recent chest x-ray in laboratory and hemodynamic data personal reviewed  50 year old Caucasian male nondiabetic nonsmoker diagnosed with nonischemic cardiomyopathy 2002 at which time a Medtronic AICD was placed which has since never discharged. At that time he was also placed on Coumadin without any complications for approximately a year.his ejection fraction at that time was 25%. The patient is being carefully followed by Dr. Gwenlyn Found over the ensuing 15 years has been stable until recently. 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 He recently has had his heart meds cut back because of hypotension. He was hospitalized for heart failure last month in [place] associated with a viral gastroenteritis. He was readmitted at this time after being evaluated in the clinic and found to be in probable low output cardiac failure. He noted some increase in abdominal girth and extreme exercise intolerance. Echocardiogram -TEE previously performed showed LV end-diastolic diameter at 7.4 cm with moderate MR. RV function with moderate decrease and mild-moderate TR. No evidence of significant AI or  LV thrombus.  The patient was admitted and placed on lower known with improved symptoms. CVP has been 12-15 cm H2O. Chest x-ray shows probable bibasilar atelectasis-edema. LFTs show probable passive hepatic congestion. Right and left heart cath is pending tomorrow. The patient is maintained sinus rhythm.  The patient has no history of bleeding diathesis or thromboembolic problems. He has  never had colonoscopy or clinical GI bleeding. He tolerated Coumadin for a least a year when he was first diagnosed with cardiomyopathy in 2002.    Current Activity/ Functional Status: Patient lives with his wife. He was  unable to do any daily activities without difficulty prior to admission.  Zubrod Score: At the time of surgery this patient's most appropriate activity status/level should be described as: []     0    Normal activity, no symptoms []     1    Restricted in physical strenuous activity but ambulatory, able to do out light work []     2    Ambulatory and capable of self care, unable to do work activities, up and about                 more than 50%  Of the time                            [x]     3    Only limited self care, in bed greater than 50% of waking hours []     4    Completely disabled, no self care, confined to bed or chair []     5    Moribund  Past Medical History  Diagnosis Date  . Nonischemic cardiomyopathy ([place])   . ICD (implantable cardioverter-defibrillator), single, in situ 08/26/2007  . Hx of echocardiogram 11/09/1910    Showed an EF of 25% with no significant valve disease.  Marland Kitchen AICD (automatic cardioverter/defibrillator) present   . Presence  of permanent cardiac pacemaker   . CHF (congestive heart failure) [facility])     Past Surgical History  Procedure Laterality Date  . Appendectomy    . Pacemaker insertion  08/26/2007    place by Dr. Alroy Dust at [facility]  . Cardiac catheterization  2002    Normal coronary arteries    History  Smoking status  . Former Smoker  . Quit date: 02/20/1998  Smokeless tobacco  . Not on file    History  Alcohol Use  . 0.0 oz/week  . 0 Standard drinks or equivalent per week    Comment: has quit drinking x 1 1/2 months. He sporadically drinks    Social History   Social History  . Marital Status: Married    Spouse Name: N/A  . Number of Children: N/A  . Years of Education: N/A   Occupational History  . Not  on file.   Social History Main Topics  . Smoking status: Former Smoker    Quit date: 02/20/1998  . Smokeless tobacco: Not on file  . Alcohol Use: 0.0 oz/week    0 Standard drinks or equivalent per week     Comment: has quit drinking x 1 1/2 months. He sporadically drinks  . Drug Use: No  . Sexual Activity: Yes   Other Topics Concern  . Not on file   Social History Narrative    Allergies  Allergen Reactions  . Penicillins     Has patient had a PCN reaction causing immediate rash, facial/tongue/throat swelling, SOB or lightheadedness with hypotension: Yes Has patient had a PCN reaction causing severe rash involving mucus membranes or skin necrosis: No Has patient had a PCN reaction that required hospitalization No Has patient had a PCN reaction occurring within the last 10 years: No If all of the above answers are "NO", then may proceed with Cephalosporin use.  Unknown-childhood  . Sulfa Antibiotics     Current Facility-Administered Medications  Medication Dose Route Frequency Provider Last Rate Last Dose  . 0.9 %  sodium chloride infusion  250 mL Intravenous PRN Liliane Shi, PA-C 4 mL/hr at 05/03/15 0700 250 mL at 05/03/15 0700  . acetaminophen (TYLENOL) tablet 500 mg  500 mg Oral Q6H PRN Liliane Shi, PA-C   500 mg at 05/02/15 1056  . ALPRAZolam Duanne Moron) tablet 0.25 mg  0.25 mg Oral Daily PRN Liliane Shi, PA-C   0.25 mg at 05/02/15 2127  . aspirin EC tablet 81 mg  81 mg Oral Daily Liliane Shi, PA-C   81 mg at 05/03/15 [id number]  . carvedilol (COREG) tablet 3.125 mg  3.125 mg Oral BID WC Larey Dresser, MD   3.125 mg at 05/03/15 [id number]  . digoxin (LANOXIN) tablet 0.125 mg  0.125 mg Oral Daily Liliane Shi, PA-C   0.125 mg at 05/03/15 [id number]  . enoxaparin (LOVENOX) injection 40 mg  40 mg Subcutaneous Daily Liliane Shi, PA-C   40 mg at 05/03/15 [id number]  . furosemide (LASIX) injection 80 mg  80 mg Intravenous BID Larey Dresser, MD   80 mg at 05/03/15 [id number]  . loratadine  (CLARITIN) tablet 10 mg  10 mg Oral Daily Liliane Shi, PA-C   10 mg at 05/03/15 [id number]  . magnesium oxide (MAG-OX) tablet 400 mg  400 mg Oral Daily Larey Dresser, MD      . milrinone [facility]) 20 MG/100ML (0.2 mg/mL) infusion  0.25 mcg/kg/min Intravenous Continuous Larey Dresser, MD 6.1 mL/hr  at 05/03/15 0700 0.25 mcg/kg/min at 05/03/15 0700  . montelukast (SINGULAIR) tablet 10 mg  10 mg Oral QHS Liliane Shi, PA-C   10 mg at 05/02/15 2132  . ondansetron (ZOFRAN) injection 4 mg  4 mg Intravenous Q6H PRN Liliane Shi, PA-C      . pantoprazole (PROTONIX) EC tablet 40 mg  40 mg Oral Daily Liliane Shi, PA-C   40 mg at 05/03/15 [id number]  . potassium chloride SA (K-DUR,KLOR-CON) CR tablet 40 mEq  40 mEq Oral BID Conrad [place], NP   40 mEq at 05/03/15 [id number]  . potassium chloride SA (K-DUR,KLOR-CON) CR tablet 40 mEq  40 mEq Oral Once Amy D Clegg, NP      . sodium chloride 0.9 % injection 10-40 mL  10-40 mL Intracatheter Q12H Jerline Pain, MD   10 mL at 05/02/15 0922  . sodium chloride 0.9 % injection 10-40 mL  10-40 mL Intracatheter PRN Jerline Pain, MD      . sodium chloride 0.9 % injection 3 mL  3 mL Intravenous Q12H Liliane Shi, PA-C   3 mL at 05/02/15 2132  . sodium chloride 0.9 % injection 3 mL  3 mL Intravenous PRN Liliane Shi, PA-C      . zolpidem (AMBIEN) tablet 5 mg  5 mg Oral QHS PRN Conrad [place], NP   5 mg at 05/02/15 2127    Prescriptions prior to admission  Medication Sig Dispense Refill Last Dose  . aspirin EC 81 MG tablet Take 81 mg by mouth daily.   04/28/2015 at Unknown time  . carvedilol (COREG) 12.5 MG tablet Take 1 tablet (12.5 mg total) by mouth 2 (two) times daily with a meal. 180 tablet 3 04/28/2015 at 2100  . cetirizine (ZYRTEC) 10 MG tablet Take 10 mg by mouth daily.   04/28/2015 at Unknown time  . Coenzyme Q10 (COQ-10) 200 MG CAPS Take 1 capsule by mouth daily.   04/28/2015 at Unknown time  . digoxin (LANOXIN) 0.125 MG tablet Take 0.125 mg by mouth daily.   04/28/2015  at Unknown time  . esomeprazole (NEXIUM) 40 MG capsule Take 40 mg by mouth at bedtime.    04/28/2015 at Unknown time  . fish oil-omega-3 fatty acids 1000 MG capsule Take 2 g by mouth daily.    Past Week at Unknown time  . furosemide (LASIX) 20 MG tablet Take 1 tablet (20 mg total) by mouth daily. My take 40 mg by mouth daily as needed for swelling. 180 tablet 3 04/28/2015 at Unknown time  . LORazepam (ATIVAN) 0.5 MG tablet Take 0.5 mg by mouth at bedtime.   04/28/2015 at Unknown time  . montelukast (SINGULAIR) 10 MG tablet Take 10 mg by mouth at bedtime.   04/28/2015 at Unknown time    Family History  Problem Relation Age of Onset  . Hypertension Mother   . Hypertension Father   . Cancer - Prostate Maternal Grandfather      Review of Systems:       Cardiac Review of Systems: Y or N  Chest Pain [ no  ]  Resting SOB [  no ] Exertional SOB  [place]  ]  Orthopnea [place]  ]   Pedal Edema [  yes ]    Palpitations [place]er  ] Syncope  [ no ]   Presyncope [ no  ]  General Review of Systems: [Y] = yes [  ]=no Constitional: recent weight change [  ];  anorexia [  ]; fatigue [  ]; nausea [  ]; night sweats [  ]; fever [  ]; or chills [  ]    Gastroenteritis December 2016                                                           Dental: poor dentition[  ]; Last Dentist visit: every 6 months  Eye : blurred vision [  ]; diplopia [   ]; vision changes [  ];  Amaurosis fugax[  ]; Resp: cough [ yes ];  wheezing[  ];  hemoptysis[  ]; shortness of breath[yes  ]; paroxysmal nocturnal dyspnea[ yes ]; dyspnea on exertion[yes  ]; or orthopnea[  ];  GI:  gallstones[  ], vomiting[  ];  dysphagia[  ]; melena[  ];  hematochezia [  ]; heartburn[  ];   Hx of  Colonoscopy[  ]; GU: kidney stones [  Yes on left side]; hematuria[  ];   dysuria [  ];  nocturia[  ];  history of     obstruction [  ]; urinary frequency [  ]             Skin: rash, swelling[  ];, hair loss[  ];  peripheral edema[  ];  or itching[  ]; Musculosketetal:  myalgias[  ];  joint swelling[  ];  joint erythema[  ];  joint pain[  ];  back pain[  ];  Heme/Lymph: bruising[  ];  bleeding[  ];  anemia[  ];  Neuro: TIA[  ];  headaches[  ];  stroke[  ];  vertigo[  ];  seizures[  ];   paresthesias[  ];  difficulty walking[  ];  Psych:depression[  ]; anxiety[  ];  Endocrine: diabetes[ no ];  thyroid dysfunction[  ];  Immunizations: Flu [  ]; Pneumococcal[  ];  Other:no history of thoracic trauma or pneumothorax. No problems with anesthesia for dental surgery or AICD placement. No bleeding problems. Patient's mother-in-law had a heart transplant at [facility] in [place]  Physical Exam: BP 113/83 mmHg  Pulse 105  Temp(Src) 97.2 F (36.2 C) (Axillary)  Resp 16  Ht 5\' 11"  (1.803 m)  Wt 171 lb 12.8 oz (77.928 kg)  BMI 23.97 kg/m2  SpO2 95%      Physical Exam  General: Well-nourished middle-aged Caucasian male comfortable in the CCU HEENT: Normocephalic pupils equal , dentition adequate Neck: Supple without JVD, adenopathy, or bruit Chest: Clear to auscultation, symmetrical breath sounds, no rhonchi, no tenderness             or deformity Cardiovascular: Regular rate and rhythm, no murmur, no gallop, peripheral pulses             palpable in all extremities Abdomen:  Soft, nontender, no palpable mass or organomegaly Extremities: Warm, well-perfused, no clubbing cyanosis edema or tenderness,              no venous stasis changes of the legs Rectal/GU: Deferred Neuro: Grossly non--focal and symmetrical throughout Skin: Clean and dry without rash or ulceration    Diagnostic Studies & Laboratory data:   TEE, chest x-ray, laboratory data all . Personally reviewed  Recent Radiology Findings:   No results found.   I have independently reviewed the above radiologic studies.  Recent Lab Findings: Lab Results  Component Value Date   WBC 6.4 05/03/2015   HGB 14.1 05/03/2015   HCT 40.5 05/03/2015   PLT 182 05/03/2015   GLUCOSE 114*  05/03/2015   ALT 50 04/29/2015   AST 73* 04/29/2015   NA 136 05/03/2015   K 3.8 05/03/2015   CL 94* 05/03/2015   CREATININE 1.08 05/03/2015   BUN 16 05/03/2015   CO2 31 05/03/2015   TSH 4.135 04/29/2015   INR 1.32 04/29/2015      Assessment / Plan:     Acute on chronic heart failure with probable cardiogenic shock on admission    Nonischemic cardiomyopathy with severe LV dysfunction, LVEF 0.15, LVEDD 7.4, moderate RV dysfunction      Patient appears to be an appropriate candidate for evaluation for  Bridge to transplant/destination therapy LVAD implantation. Appears to be excellent transplant candidate as well. I discussed the role of LVAD therapy in  advanced heart failure and he is interested in proceeding with full evaluation.      @ME1 @ 05/03/2015 10:17 AM

## 2015-05-03 NOTE — Progress Notes (Signed)
Initial Encounter with LVAD Team and MCS Introduction:  [name] is a 50 year old caucasian male currently admitted to [facility] for inotropes and low blood pressure.   Clinical course with heart failure: Was diagnosed with low EF in 2002 after [facility] with Dr. Gwenlyn Found. Last several months have had to come off ACE/ARB therapy d/t intolerant BP. Was admitted for volume overload and low BP when HF service was asked to evaluate patient. Long standing 15y established H/O CHF and likely will need to consider advanced therapies.   VAD educational packet including "HM II Patient Handbook", "HM II Left Ventricular Assist System" packet, and "[place] HM II Patient Education" reviewed in detail with me and left at bedside for continued reference. Patient education DVD was given to her as well for reference should she want to see more about the equipment at home after reviewing the information I left her.   Explained that LVAD can be implanted for two indications in the setting of advanced left ventricular heart failure treatment:  1. Bridge to transplant - used for patients who cannot safely wait for heart transplant without this device.  Or   2. Destination therapy - used for patients until end of life or recovery of heart function.  Discussed that at this point the patient would be considered for Destination therapy should the patient be deemed an acceptable VAD candidate at this time--however would have high hope for bridge to candidacy for heart transplant as he is young and medical problems are limited to HF.   Provided brief equipment overview of the HeartMate II pump and discussed placement, surgical procedure, peripheral equipment, life-long coumadin therapy, importance of medication adherence and clinic follow up for as long as patient is living on support, life-style modifications, as well as need for caregiver to be successful with this therapy.   The patient was engaged and asked great  questions throughout the discussion. We discussed the process of the evaluation period and how a decision was made by the [facility] team whether she would be an appropriate candidate for therapy or not. Evaluation consent was reviewed and given for reference while the patient makes his/her decision to proceed with candidacy evaluation.   Caregiver Support: wife and 2 older sons  Home Inspection Checklist: reports 3-prong grounded outlet at home  Advised the patient review the materials, contact either myself or Zada Girt with questions and we will plan on meeting with him and his wife after his [facility] tomorrow AM to discuss plan and answer questions. Verbalized he would review the evaluation consent and make a decision regarding the evaluation process.   At this point after briefly reviewing her chart some points that would exclude the patient or make them extremely high risk to have LVAD surgery include:   None currently identified. Reports that a relative on his mother's side received a heart transplant 10 years ago and is familiar with that process.     Will continue to FU with Mr. Carnahan for evaluation for LVAD vs. Heart Transplant. He has given me permission to place orders for much of the lab work that is involved that takes a few days to result and he will review the formal consent for evaluation tonight with his wife tonight. Enjoyed meeting him very much and seems that he will be a great candidate for either therapy.   Session Time: 30 minutes  Janene Madeira, RN VAD Coordinator   Office: [phone] 24/7 VAD Pager: [phone]

## 2015-05-03 NOTE — Progress Notes (Signed)
CARDIAC REHAB PHASE I   PRE:  Rate/Rhythm: 105 ST    BP: sitting 98/84    SaO2:   MODE:  Ambulation: 950 ft   POST:  Rate/Rhythm: 118 ST    BP: sitting 91/77     SaO2:   Tolerated fairly well. C/o legs feeling weak/fatigued. HR up, BP down. Gave HF booklet as wife took his to the car. A visitor came therefore will wait another day to do more education. Pt has good understanding of situation already. [id number]   Patrick Humphrey [place] CES, ACSM 05/03/2015 11:45 AM

## 2015-05-04 ENCOUNTER — Encounter: Payer: Self-pay | Admitting: Physician Assistant

## 2015-05-04 ENCOUNTER — Encounter (HOSPITAL_COMMUNITY): Payer: Self-pay | Admitting: Cardiology

## 2015-05-04 ENCOUNTER — Encounter (HOSPITAL_COMMUNITY)
Admission: AD | Disposition: A | Discharge status: Home Health | Payer: Self-pay | Source: Ambulatory Visit | Attending: Cardiothoracic Surgery

## 2015-05-04 ENCOUNTER — Inpatient Hospital Stay (HOSPITAL_COMMUNITY): Payer: Medicare Other

## 2015-05-04 DIAGNOSIS — I429 Cardiomyopathy, unspecified: Secondary | ICD-10-CM

## 2015-05-04 DIAGNOSIS — Z515 Encounter for palliative care: Secondary | ICD-10-CM

## 2015-05-04 HISTORY — PX: CARDIAC CATHETERIZATION: SHX172

## 2015-05-04 LAB — URIC ACID: Uric Acid, Serum: 11.4 mg/dL — ABNORMAL HIGH (ref 4.4–7.6)

## 2015-05-04 LAB — CBC
HCT: 40.2 % (ref 39.0–52.0)
HCT: 38 % — ABNORMAL LOW (ref 39.0–52.0)
Hemoglobin: 13.9 g/dL (ref 13.0–17.0)
Hemoglobin: 13 g/dL (ref 13.0–17.0)
MCH: 33.1 pg (ref 26.0–34.0)
MCH: 32.9 pg (ref 26.0–34.0)
MCHC: 34.6 g/dL (ref 30.0–36.0)
MCHC: 34.2 g/dL (ref 30.0–36.0)
MCV: 95.7 fL (ref 78.0–100.0)
MCV: 96.2 fL (ref 78.0–100.0)
Platelets: 166 10*3/uL (ref 150–400)
Platelets: 163 10*3/uL (ref 150–400)
RBC: 4.2 MIL/uL — ABNORMAL LOW (ref 4.22–5.81)
RBC: 3.95 MIL/uL — ABNORMAL LOW (ref 4.22–5.81)
RDW: 16.1 % — ABNORMAL HIGH (ref 11.5–15.5)
RDW: 16.1 % — ABNORMAL HIGH (ref 11.5–15.5)
WBC: 6.4 10*3/uL (ref 4.0–10.5)
WBC: 6.2 10*3/uL (ref 4.0–10.5)

## 2015-05-04 LAB — URINALYSIS, ROUTINE W REFLEX MICROSCOPIC
Bilirubin Urine: NEGATIVE
Glucose, UA: NEGATIVE mg/dL
Hgb urine dipstick: NEGATIVE
Ketones, ur: NEGATIVE mg/dL
Leukocytes, UA: NEGATIVE
Nitrite: NEGATIVE
Protein, ur: NEGATIVE mg/dL
Specific Gravity, Urine: 1.014 (ref 1.005–1.030)
pH: 8 (ref 5.0–8.0)

## 2015-05-04 LAB — POCT I-STAT 3, VENOUS BLOOD GAS (G3P V)
Acid-Base Excess: 11 mmol/L — ABNORMAL HIGH (ref 0.0–2.0)
Acid-Base Excess: 6 mmol/L — ABNORMAL HIGH (ref 0.0–2.0)
Bicarbonate: 36.2 mEq/L — ABNORMAL HIGH (ref 20.0–24.0)
Bicarbonate: 30.6 mEq/L — ABNORMAL HIGH (ref 20.0–24.0)
O2 Saturation: 63 %
O2 Saturation: 57 %
TCO2: 38 mmol/L (ref 0–100)
TCO2: 32 mmol/L (ref 0–100)
pCO2, Ven: 47.4 mmHg (ref 45.0–50.0)
pCO2, Ven: 43.7 mmHg — ABNORMAL LOW (ref 45.0–50.0)
pH, Ven: 7.492 — ABNORMAL HIGH (ref 7.250–7.300)
pH, Ven: 7.453 — ABNORMAL HIGH (ref 7.250–7.300)
pO2, Ven: 30 mmHg (ref 30.0–45.0)
pO2, Ven: 28 mmHg — CL (ref 30.0–45.0)

## 2015-05-04 LAB — CREATININE, SERUM
Creatinine, Ser: 1.03 mg/dL (ref 0.61–1.24)
GFR calc Af Amer: >60 mL/min (ref >60)
GFR calc non Af Amer: >60 mL/min (ref >60)

## 2015-05-04 LAB — BASIC METABOLIC PANEL
Anion gap: 16 — ABNORMAL HIGH (ref 5–15)
Anion gap: 13 (ref 5–15)
BUN: 12 mg/dL (ref 6–20)
BUN: 15 mg/dL (ref 6–20)
CO2: 29 mmol/L (ref 22–32)
CO2: 30 mmol/L (ref 22–32)
Calcium: 8.3 mg/dL — ABNORMAL LOW (ref 8.9–10.3)
Calcium: 9 mg/dL (ref 8.9–10.3)
Chloride: 89 mmol/L — ABNORMAL LOW (ref 101–111)
Chloride: 91 mmol/L — ABNORMAL LOW (ref 101–111)
Creatinine, Ser: 1.08 mg/dL (ref 0.61–1.24)
Creatinine, Ser: 1.03 mg/dL (ref 0.61–1.24)
GFR calc Af Amer: >60 mL/min (ref >60)
GFR calc Af Amer: >60 mL/min (ref >60)
GFR calc non Af Amer: >60 mL/min (ref >60)
GFR calc non Af Amer: >60 mL/min (ref >60)
Glucose, Bld: 101 mg/dL — ABNORMAL HIGH (ref 65–99)
Glucose, Bld: 92 mg/dL (ref 65–99)
Potassium: 4.1 mmol/L (ref 3.5–5.1)
Potassium: 4.1 mmol/L (ref 3.5–5.1)
Sodium: 134 mmol/L — ABNORMAL LOW (ref 135–145)
Sodium: 134 mmol/L — ABNORMAL LOW (ref 135–145)

## 2015-05-04 LAB — CARBOXYHEMOGLOBIN
Carboxyhemoglobin: 2.1 % — ABNORMAL HIGH (ref 0.5–1.5)
Methemoglobin: 0.6 % (ref 0.0–1.5)
O2 Saturation: 66.4 %
Total hemoglobin: 11.3 g/dL — ABNORMAL LOW (ref 13.5–18.0)

## 2015-05-04 LAB — PLATELET INHIBITION P2Y12: Platelet Function  P2Y12: 147 [PRU] — ABNORMAL LOW (ref 194–418)

## 2015-05-04 LAB — RAPID URINE DRUG SCREEN, HOSP PERFORMED
Amphetamines: NOT DETECTED
Barbiturates: NOT DETECTED
Benzodiazepines: NOT DETECTED
Cocaine: NOT DETECTED
Opiates: NOT DETECTED
Tetrahydrocannabinol: NOT DETECTED

## 2015-05-04 LAB — LIPID PANEL
Cholesterol: 87 mg/dL (ref 0–200)
HDL: 26 mg/dL — ABNORMAL LOW (ref >40)
LDL Cholesterol: 53 mg/dL (ref 0–99)
Total CHOL/HDL Ratio: 3.3 RATIO
Triglycerides: 41 mg/dL (ref <150)
VLDL: 8 mg/dL (ref 0–40)

## 2015-05-04 LAB — PREALBUMIN: Prealbumin: 8.6 mg/dL — ABNORMAL LOW (ref 18–38)

## 2015-05-04 LAB — MAGNESIUM: Magnesium: 2.1 mg/dL (ref 1.7–2.4)

## 2015-05-04 LAB — PSA: PSA: 0.33 ng/mL (ref 0.00–4.00)

## 2015-05-04 LAB — PROTIME-INR
INR: 1.15 (ref 0.00–1.49)
Prothrombin Time: 14.9 seconds (ref 11.6–15.2)

## 2015-05-04 LAB — ANTITHROMBIN III: AntiThromb III Func: 54 % — ABNORMAL LOW (ref 75–120)

## 2015-05-04 LAB — LACTATE DEHYDROGENASE: LDH: 175 U/L (ref 98–192)

## 2015-05-04 LAB — HIV ANTIBODY (ROUTINE TESTING W REFLEX): HIV Screen 4th Generation wRfx: NONREACTIVE

## 2015-05-04 SURGERY — RIGHT/LEFT HEART CATH AND CORONARY ANGIOGRAPHY

## 2015-05-04 MED ORDER — HEPARIN (PORCINE) IN NACL 2-0.9 UNIT/ML-% IJ SOLN
INTRAMUSCULAR | Status: DC | PRN
  Administered 2015-05-04: 09:00:00 via INTRA_ARTERIAL

## 2015-05-04 MED ORDER — SODIUM CHLORIDE 0.9% FLUSH: 3.0000 mL | Freq: Two times a day (BID) | INTRAVENOUS | Status: DC

## 2015-05-04 MED ORDER — SODIUM CHLORIDE 0.9% FLUSH: 3.0000 mL | INTRAVENOUS | Status: DC | PRN

## 2015-05-04 MED ORDER — FENTANYL CITRATE (PF) 100 MCG/2ML IJ SOLN
INTRAMUSCULAR | Status: DC | PRN
  Administered 2015-05-04: 25 ug via INTRAVENOUS

## 2015-05-04 MED ORDER — IOHEXOL 350 MG/ML SOLN
INTRAVENOUS | Status: DC | PRN
  Administered 2015-05-04: 75 mL via INTRA_ARTERIAL

## 2015-05-04 MED ORDER — MAGNESIUM OXIDE 400 (241.3 MG) MG PO TABS
400.0000 mg | ORAL_TABLET | Freq: Every day | ORAL | Status: DC
  Administered 2015-05-04 – 2015-05-09 (×6): 400 mg via ORAL
  Filled 2015-05-04 (×5): qty 1

## 2015-05-04 MED ORDER — LORATADINE 10 MG PO TABS
10.0000 mg | ORAL_TABLET | Freq: Every day | ORAL | Status: DC
  Administered 2015-05-04 – 2015-05-09 (×6): 10 mg via ORAL
  Filled 2015-05-04 (×5): qty 1

## 2015-05-04 MED ORDER — MIDAZOLAM HCL 2 MG/2ML IJ SOLN
INTRAMUSCULAR | Status: DC | PRN
  Administered 2015-05-04: 1 mg via INTRAVENOUS

## 2015-05-04 MED ORDER — SODIUM CHLORIDE 0.9 % IV SOLN: 250.0000 mL | INTRAVENOUS | Status: DC | PRN

## 2015-05-04 MED ORDER — VERAPAMIL HCL 2.5 MG/ML IV SOLN
INTRAVENOUS | Status: AC
  Filled 2015-05-04: qty 2

## 2015-05-04 MED ORDER — SODIUM CHLORIDE 0.9 % IJ SOLN: 3.0000 mL | INTRAMUSCULAR | Status: DC | PRN

## 2015-05-04 MED ORDER — ACETAMINOPHEN 325 MG PO TABS: 650.0000 mg | ORAL_TABLET | ORAL | Status: DC | PRN

## 2015-05-04 MED ORDER — ENOXAPARIN SODIUM 40 MG/0.4ML ~~LOC~~ SOLN
40.0000 mg | SUBCUTANEOUS | Status: DC
  Administered 2015-05-05 – 2015-05-08 (×4): 40 mg via SUBCUTANEOUS
  Filled 2015-05-04 (×4): qty 0.4

## 2015-05-04 MED ORDER — SODIUM CHLORIDE 0.9 % IJ SOLN: 3.0000 mL | Freq: Two times a day (BID) | INTRAMUSCULAR | Status: DC

## 2015-05-04 MED ORDER — FENTANYL CITRATE (PF) 100 MCG/2ML IJ SOLN
INTRAMUSCULAR | Status: AC
  Filled 2015-05-04: qty 2

## 2015-05-04 MED ORDER — MILRINONE IN DEXTROSE 20 MG/100ML IV SOLN
0.1250 ug/kg/min | INTRAVENOUS | Status: DC
  Administered 2015-05-04 – 2015-05-05 (×2): 0.25 ug/kg/min via INTRAVENOUS
  Filled 2015-05-04 (×2): qty 100

## 2015-05-04 MED ORDER — METOLAZONE 2.5 MG PO TABS
2.5000 mg | ORAL_TABLET | Freq: Once | ORAL | Status: AC
  Administered 2015-05-04: 2.5 mg via ORAL
  Filled 2015-05-04: qty 1

## 2015-05-04 MED ORDER — HEPARIN SODIUM (PORCINE) 1000 UNIT/ML IJ SOLN
INTRAMUSCULAR | Status: DC | PRN
  Administered 2015-05-04: 4000 [IU] via INTRAVENOUS

## 2015-05-04 MED ORDER — FUROSEMIDE 10 MG/ML IJ SOLN
80.0000 mg | Freq: Two times a day (BID) | INTRAMUSCULAR | Status: DC
  Administered 2015-05-04 (×2): 80 mg via INTRAVENOUS
  Filled 2015-05-04 (×2): qty 8

## 2015-05-04 MED ORDER — DIGOXIN 125 MCG PO TABS
0.1250 mg | ORAL_TABLET | Freq: Every day | ORAL | Status: DC
  Administered 2015-05-04 – 2015-05-09 (×6): 0.125 mg via ORAL
  Filled 2015-05-04 (×5): qty 1

## 2015-05-04 MED ORDER — LIDOCAINE HCL (PF) 1 % IJ SOLN
INTRAMUSCULAR | Status: AC
  Filled 2015-05-04: qty 30

## 2015-05-04 MED ORDER — PANTOPRAZOLE SODIUM 40 MG PO TBEC
40.0000 mg | DELAYED_RELEASE_TABLET | Freq: Every day | ORAL | Status: DC
  Administered 2015-05-04 – 2015-05-09 (×6): 40 mg via ORAL
  Filled 2015-05-04 (×5): qty 1

## 2015-05-04 MED ORDER — HEPARIN SODIUM (PORCINE) 1000 UNIT/ML IJ SOLN
INTRAMUSCULAR | Status: AC
  Filled 2015-05-04: qty 1

## 2015-05-04 MED ORDER — ONDANSETRON HCL 4 MG/2ML IJ SOLN: 4.0000 mg | Freq: Four times a day (QID) | INTRAMUSCULAR | Status: DC | PRN

## 2015-05-04 MED ORDER — LIDOCAINE HCL (PF) 1 % IJ SOLN
INTRAMUSCULAR | Status: DC | PRN
  Administered 2015-05-04: 10:00:00

## 2015-05-04 MED ORDER — MIDAZOLAM HCL 2 MG/2ML IJ SOLN
INTRAMUSCULAR | Status: AC
  Filled 2015-05-04: qty 2

## 2015-05-04 SURGICAL SUPPLY — 17 items
CATH BALLN WEDGE 5F 110CM (CATHETERS) ×2 IMPLANT
CATH INFINITI 5 FR 3DRC (CATHETERS) ×1 IMPLANT
CATH INFINITI 5 FR JL3.5 (CATHETERS) ×2 IMPLANT
CATH INFINITI 5FR ANG PIGTAIL (CATHETERS) ×2 IMPLANT
CATH INFINITI JR4 5F (CATHETERS) ×2 IMPLANT
DEVICE RAD COMP TR BAND LRG (VASCULAR PRODUCTS) ×2 IMPLANT
GLIDESHEATH SLEND SS 6F .021 (SHEATH) ×2 IMPLANT
KIT HEART LEFT (KITS) ×2 IMPLANT
KIT HEART RIGHT NAMIC (KITS) ×2 IMPLANT
PACK CARDIAC CATHETERIZATION (CUSTOM PROCEDURE TRAY) ×2 IMPLANT
SHEATH FAST CATH BRACH 5F 5CM (SHEATH) ×2 IMPLANT
SYR MEDRAD MARK V 150ML (SYRINGE) ×2 IMPLANT
TRANSDUCER W/STOPCOCK (MISCELLANEOUS) ×3 IMPLANT
TUBING CIL FLEX 10 FLL-RA (TUBING) ×2 IMPLANT
WIRE EMERALD 3MM-J .025X260CM (WIRE) ×1 IMPLANT
WIRE HI TORQ VERSACORE-J 145CM (WIRE) ×2 IMPLANT
WIRE SAFE-T 1.5MM-J .035X260CM (WIRE) ×2 IMPLANT

## 2015-05-04 NOTE — Interval H&P Note (Signed)
History and Physical Interval Note:  05/04/2015 8:50 AM  Patrick Humphrey  has presented today for surgery, with the diagnosis of hf  The various methods of treatment have been discussed with the patient and family. After consideration of risks, benefits and other options for treatment, the patient has consented to  Procedure(s): Right/Left Heart Cath and Coronary Angiography (N/A) as a surgical intervention .  The patient's history has been reviewed, patient examined, no change in status, stable for surgery.  I have reviewed the patient's chart and labs.  Questions were answered to the patient's satisfaction.     Conrado Nance [employer]

## 2015-05-04 NOTE — Consult Note (Signed)
Consultation Note Date: 05/04/2015   Patient Name: Patrick Humphrey  DOB: [birth year]  MRN: [id number]  Age / Sex: 50 y.o., male  PCP: Monico Blitz, MD Referring Physician: Jerline Pain, MD  Reason for Consultation: VAD eval    Clinical Assessment/Narrative: I met today with Patrick Humphrey and his wife at bedside. They tell me how surprising and quick his decline has come for them and the thought of VAD is a little shocking and overwhelming. 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 He does not fear death but feels he has more to do and they are hoping that VAD will help with this is needed. They also tell me how they have a family member that has had a heart transplant so they are familiar with the process for transplant as well and are hoping this could be an option for him as well (ideally without needing VAD). They seem well educated and understanding of what care of VAD entails and his support system seems great. We discussed overall process of VAD from expectations what he will look like post op to complications and goals. 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 He also enjoys being active and hiking/golfing - although he has not been able to do these activities lately.   We also spent some time discussing Advance Directives and the importance of this for both he and his wife. I gave them copies of Advance Directives to discuss and contemplate at their request. All questions/concerns addressed. He appears to be highly motivated and a great candidate for VAD.   Contacts/Participants in Discussion: Primary Decision Maker: Self   Relationship to Patient   Next will be his wife  SUMMARY OF RECOMMENDATIONS - No concerns - appears to be great VAD candidate from my assessment  Code Status/Advance Care Planning: Full code    Code Status Orders        Start     Ordered   05/04/15 1028  Full code   Continuous     05/04/15 1027    Code Status History    Date Active Date Inactive Code Status Order ID Comments User Context   04/29/2015 12:04 PM 05/04/2015 10:27 AM Full Code [id number]  Liliane Shi, PA-C Inpatient       Symptom Management:   Per heart failure team. Feels much better.   Palliative Prophylaxis:   Bowel Regimen, Delirium Protocol and Frequent Pain Assessment  Additional Recommendations (Limitations, Scope, Preferences):  Full Scope Treatment  Psycho-social/Spiritual:  Support System: Strong Desire for further Chaplaincy support:yes Additional Recommendations: Caregiving  Support/Resources  Prognosis: Unable to determine  Discharge Planning: To be determined.    Chief Complaint/ Primary Diagnoses: Present on Admission:  . Acute on chronic systolic CHF (congestive heart failure) ([place]) . ICD - Medtronic Maximo II VR 2009  I have reviewed the medical record, interviewed the patient and family, and examined the patient. The following aspects are pertinent.  Past Medical History  Diagnosis Date  . Nonischemic cardiomyopathy ([place])   . ICD (implantable cardioverter-defibrillator), single, in situ 08/26/2007  . Hx of echocardiogram 11/09/1910    Showed an EF of 25% with no significant valve disease.  Marland Kitchen  AICD (automatic cardioverter/defibrillator) present   . Presence of permanent cardiac pacemaker   . CHF (congestive heart failure) ([place])    Social History   Social History  . Marital Status: Married    Spouse Name: N/A  . Number of Children: N/A  . Years of Education: N/A   Social History Main Topics  . Smoking status: Former Smoker    Quit date: 02/20/1998  . Smokeless tobacco: None  . Alcohol Use:  0.0 oz/week    0 Standard drinks or equivalent per week     Comment: has quit drinking x 1 1/2 months. He sporadically drinks  . Drug Use: No  . Sexual Activity: Yes   Other Topics Concern  . None   Social History Narrative   Family History  Problem Relation Age of Onset  . Hypertension Mother   . Hypertension Father   . Cancer - Prostate Maternal Grandfather    Scheduled Meds: . aspirin EC  81 mg Oral Daily  . carvedilol  3.125 mg Oral BID WC  . digoxin  0.125 mg Oral Daily  . [START ON 05/05/2015] enoxaparin (LOVENOX) injection  40 mg Subcutaneous Q24H  . furosemide  80 mg Intravenous BID  . loratadine  10 mg Oral Daily  . magnesium oxide  400 mg Oral Daily  . montelukast  10 mg Oral QHS  . pantoprazole  40 mg Oral Daily  . sodium chloride  10-40 mL Intracatheter Q12H  . sodium chloride  3 mL Intravenous Q12H   Continuous Infusions: . milrinone 0.25 mcg/kg/min (05/04/15 1124)   PRN Meds:.sodium chloride, acetaminophen, ALPRAZolam, ondansetron (ZOFRAN) IV, sodium chloride, sodium chloride, zolpidem Medications Prior to Admission:  Prior to Admission medications   Medication Sig Start Date End Date Taking? Authorizing Provider  aspirin EC 81 MG tablet Take 81 mg by mouth daily.   Yes Historical Provider, MD  carvedilol (COREG) 12.5 MG tablet Take 1 tablet (12.5 mg total) by mouth 2 (two) times daily with a meal. 03/09/15  Yes Lorretta Harp, MD  cetirizine (ZYRTEC) 10 MG tablet Take 10 mg by mouth daily.   Yes Historical Provider, MD  Coenzyme Q10 (COQ-10) 200 MG CAPS Take 1 capsule by mouth daily.   Yes Historical Provider, MD  digoxin (LANOXIN) 0.125 MG tablet Take 0.125 mg by mouth daily.   Yes Historical Provider, MD  esomeprazole (NEXIUM) 40 MG capsule Take 40 mg by mouth at bedtime.    Yes Historical Provider, MD  fish oil-omega-3 fatty acids 1000 MG capsule Take 2 g by mouth daily.    Yes Historical Provider, MD  furosemide (LASIX) 20 MG tablet Take 1 tablet (20 mg  total) by mouth daily. My take 40 mg by mouth daily as needed for swelling. 03/31/15  Yes Lorretta Harp, MD  LORazepam (ATIVAN) 0.5 MG tablet Take 0.5 mg by mouth at bedtime.   Yes Historical Provider, MD  montelukast (SINGULAIR) 10 MG tablet Take 10 mg by mouth at bedtime.   Yes Historical Provider, MD   Allergies  Allergen Reactions  . Penicillins     Has patient had a PCN reaction causing immediate rash, facial/tongue/throat swelling, SOB or lightheadedness with hypotension: Yes Has patient had a PCN reaction causing severe rash involving mucus membranes or skin necrosis: No Has patient had a PCN reaction that required hospitalization No Has patient had a PCN reaction occurring within the last 10 years: No If all of the above answers are "NO", then may proceed with Cephalosporin  use.  Unknown-childhood  . Sulfa Antibiotics     Review of Systems  Physical Exam  Vital Signs: BP 104/91 mmHg  Pulse 90  Temp(Src) 98.6 F (37 C) (Oral)  Resp 21  Ht _0  (1.803 m)  Wt 76.431 kg (168 lb 8 oz)  BMI 23.51 kg/m2  SpO2 97%  SpO2: SpO2: 97 % O2 Device:SpO2: 97 % O2 Flow Rate: .   IO: Intake/output summary:  Intake/Output Summary (Last 24 hours) at 05/04/15 1446 Last data filed at 05/04/15 1200  Gross per 24 hour  Intake 401.53 ml  Output   2060 ml  Net -1658.47 ml    LBM: Last BM Date: 05/02/15 Baseline Weight: Weight: 81.2 kg (179 lb 0.2 oz) Most recent weight: Weight: 76.431 kg (168 lb 8 oz)      Palliative Assessment/Data:    Additional Data Reviewed:  CBC:    Component Value Date/Time   WBC 6.2 05/04/2015 1050   HGB 13.0 05/04/2015 1050   HCT 38.0* 05/04/2015 1050   PLT 163 05/04/2015 1050   MCV 96.2 05/04/2015 1050   NEUTROABS 4.7 04/29/2015 1650   LYMPHSABS 2.2 04/29/2015 1650   MONOABS 0.8 04/29/2015 1650   EOSABS 0.1 04/29/2015 1650   BASOSABS 0.1 04/29/2015 1650   Comprehensive Metabolic Panel:    Component Value Date/Time   NA 134* 05/04/2015  0500   NA 134* 05/04/2015 0500   K 4.1 05/04/2015 0500   K 4.1 05/04/2015 0500   CL 89* 05/04/2015 0500   CL 91* 05/04/2015 0500   CO2 29 05/04/2015 0500   CO2 30 05/04/2015 0500   BUN 12 05/04/2015 0500   BUN 15 05/04/2015 0500   CREATININE 1.03 05/04/2015 1050   CREATININE 0.85 03/09/2015 1105   GLUCOSE 101* 05/04/2015 0500   GLUCOSE 92 05/04/2015 0500   CALCIUM 8.3* 05/04/2015 0500   CALCIUM 9.0 05/04/2015 0500   AST 73* 04/29/2015 1650   ALT 50 04/29/2015 1650   ALKPHOS 103 04/29/2015 1650   BILITOT 4.2* 04/29/2015 1650   PROT 7.0 04/29/2015 1650   ALBUMIN 3.3* 04/29/2015 1650     Time In: 1310 Time Out: 1430 Time Total: 33mn Greater than 50%  of this time was spent counseling and coordinating care related to the above assessment and plan.  Signed by: PPershing Proud NP  APershing Proud NP  16/09/7701 2:46 PM  Please contact Palliative Medicine Team phone at [phone] questions and concerns.

## 2015-05-04 NOTE — Progress Notes (Signed)
Patient ID: Patrick Humphrey, male   DOB: 1965/06/12, 50 y.o.   MRN: [id number]   SUBJECTIVE:   Admitted with marked volume overload. Diuresed with IV lasix and started on 0.25 mcg milrinone.   Earlier today milrinone stopped. Denies SOB/orthopnea.   Co-ox 66% but only off milrinone for a short period of time.  CVP 16 this morning. I/Os not very negative but weight down 3 lbs, says he urinated a lot.    Scheduled Meds: . aspirin EC  81 mg Oral Daily  . carvedilol  3.125 mg Oral BID WC  . digoxin  0.125 mg Oral Daily  . enoxaparin (LOVENOX) injection  40 mg Subcutaneous Daily  . loratadine  10 mg Oral Daily  . magnesium oxide  400 mg Oral Daily  . montelukast  10 mg Oral QHS  . pantoprazole  40 mg Oral Daily  . potassium chloride  40 mEq Oral BID  . sodium chloride  10-40 mL Intracatheter Q12H  . sodium chloride  3 mL Intravenous Q12H   Continuous Infusions: . sodium chloride 10 mL/hr at 05/04/15 0519   PRN Meds:.sodium chloride, acetaminophen, ALPRAZolam, ondansetron (ZOFRAN) IV, sodium chloride, sodium chloride, zolpidem    Filed Vitals:   05/03/15 1920 05/03/15 2100 05/03/15 2337 05/04/15 0332  BP:  105/80 103/90 106/75  Pulse:      Temp:  98.5 F (36.9 C) 98.5 F (36.9 C) 98.6 F (37 C)  TempSrc: Oral Oral Oral Oral  Resp:  _0 Height:      Weight:    168 lb 8 oz (76.431 kg)  SpO2:  95% 96% 96%    Intake/Output Summary (Last 24 hours) at 05/04/15 0736 Last data filed at 05/04/15 0600  Gross per 24 hour  Intake 200.23 ml  Output   1360 ml  Net -1159.77 ml    LABS: Basic Metabolic Panel:  Recent Labs  05/02/15 0512 05/03/15 0529  NA 137 136  K 3.6 3.8  CL 97* 94*  CO2 30 31  GLUCOSE 106* 114*  BUN 18 16  CREATININE 1.16 1.08  CALCIUM 8.5* 9.0  MG 1.5* 1.8   Liver Function Tests: No results for input(s): AST, ALT, ALKPHOS, BILITOT, PROT, ALBUMIN in the last 72 hours. No results for input(s): LIPASE, AMYLASE in the last 72  hours. CBC:  Recent Labs  05/03/15 0529 05/04/15 0500  WBC 6.4 6.4  HGB 14.1 13.9  HCT 40.5 40.2  MCV 94.8 95.7  PLT 182 166   Cardiac Enzymes: No results for input(s): CKTOTAL, CKMB, CKMBINDEX, TROPONINI in the last 72 hours. BNP: Invalid input(s): POCBNP D-Dimer: No results for input(s): DDIMER in the last 72 hours. Hemoglobin A1C: No results for input(s): HGBA1C in the last 72 hours. Fasting Lipid Panel:  Recent Labs  05/04/15 0500  CHOL 87  HDL 26*  LDLCALC 53  TRIG 41  CHOLHDL 3.3   Thyroid Function Tests: No results for input(s): TSH, T4TOTAL, T3FREE, THYROIDAB in the last 72 hours.  Invalid input(s): FREET3 Anemia Panel: No results for input(s): VITAMINB12, FOLATE, FERRITIN, TIBC, IRON, RETICCTPCT in the last 72 hours.  RADIOLOGY: Dg Chest 2 View  04/30/2015  CLINICAL DATA:  Cough, allergy induced asthma, history of pacemaker, CHF, nonsmoker. EXAM: CHEST  2 VIEW COMPARISON:  Chest x-rays dated 04/23/2015 and 01/06/2015. FINDINGS: New airspace opacity is present at the right lung base. Suspect small adjacent right pleural effusion. Chronic mild atelectasis/scarring noted at the left lung base. Mild cardiomegaly is unchanged.  Right chest wall pacemaker/AICD is stable in position. No acute osseous abnormality seen. IMPRESSION: 1. New airspace opacity at the right lung base. This could represent atelectasis, pneumonia or aspiration. If febrile, would certainly favor pneumonia. Suspect small adjacent right pleural effusion. 2. Cardiomegaly, stable. No evidence of significant volume overload/CHF. Electronically Signed   By: Franki Cabot M.D.   On: 04/30/2015 08:59    PHYSICAL EXAM General: NAD Neck: JVP 8-9  no thyromegaly or thyroid nodule.  Lungs: Clear to auscultation bilaterally with normal respiratory effort. CV: Lateral PMI.  Heart regular S1/S2, ?soft S3, no murmur.  No peripheral edema.   Abdomen: Soft, nontender, no hepatosplenomegaly.  Mild  distention Neurologic: Alert and oriented x 3.  Psych: Normal affect. Extremities: No clubbing or cyanosis. RUE PICC  TELEMETRY: NSR with PVCs. 90-100s    ASSESSMENT AND PLAN: 50 yo with history of nonischemic cardiomyopathy since 2002 was admitted with volume overload and exertional dyspnea.   1. Acute on chronic systolic CHF: Nonischemic cardiomyopathy since 2002.  Echo (12/16) with EF 20-25%, moderate MR, moderately decreased RV systolic function.  Cath in 2002 at time of diagnosis with normal coronaries.  Has Medtronic ICD.  CO-OX 66% off milrinone but just turned off when this was checked.  CVP still measuring 16 but ?accuracy, weight down 11 lbs total. - No diuretics for now until post cath.  - Continue Coreg to 3.125 mg bid. - Continue digoxin, level  0.7   - No room for Ace/Arb due to hypotension.  - RHC/LHC  today  - Cardiac Rehab following.  - We discussed the possible need for advanced therapies down the road. Potential transplant candidate.  He would also be an LVAD candidate if needed. VAD coordinators met with him.   2. Elevated LFTs: Mild, noted at admission.  Suspect due to congestive hepatopathy.  3. Hypomagnesium- Mag pending.  4. Malnutrition- Prealbumin 8.6   Amy Clegg NP-C  05/04/2015 7:36 AM   Patient seen with NP, agree with the above note.   Milrinone stopped earlier this morning for RHC/LHC today.  CVP measured at 16 though ?accuracy as weight is down 11 lbs.  BP on the low side, no room for medication titration.  - Right and left heart cath today, will decide on need for ongoing milrinone and diuresis.   [name] 05/04/2015 8:01 AM

## 2015-05-04 NOTE — H&P (View-Only) (Signed)
Patient ID: Patrick Humphrey, male   DOB: 02/17/1966, 49 y.o.   MRN: [id number]   SUBJECTIVE:   Admitted with marked volume overload. Diuresed with IV lasix and started on 0.25 mcg milrinone.   Earlier today milrinone stopped. Denies SOB/orthopnea.   Co-ox 66% but only off milrinone for a short period of time.  CVP 16 this morning. I/Os not very negative but weight down 3 lbs, says he urinated a lot.    Scheduled Meds: . aspirin EC  81 mg Oral Daily  . carvedilol  3.125 mg Oral BID WC  . digoxin  0.125 mg Oral Daily  . enoxaparin (LOVENOX) injection  40 mg Subcutaneous Daily  . loratadine  10 mg Oral Daily  . magnesium oxide  400 mg Oral Daily  . montelukast  10 mg Oral QHS  . pantoprazole  40 mg Oral Daily  . potassium chloride  40 mEq Oral BID  . sodium chloride  10-40 mL Intracatheter Q12H  . sodium chloride  3 mL Intravenous Q12H   Continuous Infusions: . sodium chloride 10 mL/hr at 05/04/15 0519   PRN Meds:.sodium chloride, acetaminophen, ALPRAZolam, ondansetron (ZOFRAN) IV, sodium chloride, sodium chloride, zolpidem    Filed Vitals:   05/03/15 1920 05/03/15 2100 05/03/15 2337 05/04/15 0332  BP:  105/80 103/90 106/75  Pulse:      Temp:  98.5 F (36.9 C) 98.5 F (36.9 C) 98.6 F (37 C)  TempSrc: Oral Oral Oral Oral  Resp:  18 18 20  Height:      Weight:    168 lb 8 oz (76.431 kg)  SpO2:  95% 96% 96%    Intake/Output Summary (Last 24 hours) at 05/04/15 0736 Last data filed at 05/04/15 0600  Gross per 24 hour  Intake 200.23 ml  Output   1360 ml  Net -1159.77 ml    LABS: Basic Metabolic Panel:  Recent Labs  05/02/15 0512 05/03/15 0529  NA 137 136  K 3.6 3.8  CL 97* 94*  CO2 30 31  GLUCOSE 106* 114*  BUN 18 16  CREATININE 1.16 1.08  CALCIUM 8.5* 9.0  MG 1.5* 1.8   Liver Function Tests: No results for input(s): AST, ALT, ALKPHOS, BILITOT, PROT, ALBUMIN in the last 72 hours. No results for input(s): LIPASE, AMYLASE in the last 72  hours. CBC:  Recent Labs  05/03/15 0529 05/04/15 0500  WBC 6.4 6.4  HGB 14.1 13.9  HCT 40.5 40.2  MCV 94.8 95.7  PLT 182 166   Cardiac Enzymes: No results for input(s): CKTOTAL, CKMB, CKMBINDEX, TROPONINI in the last 72 hours. BNP: Invalid input(s): POCBNP D-Dimer: No results for input(s): DDIMER in the last 72 hours. Hemoglobin A1C: No results for input(s): HGBA1C in the last 72 hours. Fasting Lipid Panel:  Recent Labs  05/04/15 0500  CHOL 87  HDL 26*  LDLCALC 53  TRIG 41  CHOLHDL 3.3   Thyroid Function Tests: No results for input(s): TSH, T4TOTAL, T3FREE, THYROIDAB in the last 72 hours.  Invalid input(s): FREET3 Anemia Panel: No results for input(s): VITAMINB12, FOLATE, FERRITIN, TIBC, IRON, RETICCTPCT in the last 72 hours.  RADIOLOGY: Dg Chest 2 View  04/30/2015  CLINICAL DATA:  Cough, allergy induced asthma, history of pacemaker, CHF, nonsmoker. EXAM: CHEST  2 VIEW COMPARISON:  Chest x-rays dated 04/23/2015 and 01/06/2015. FINDINGS: New airspace opacity is present at the right lung base. Suspect small adjacent right pleural effusion. Chronic mild atelectasis/scarring noted at the left lung base. Mild cardiomegaly is unchanged.   Right chest wall pacemaker/AICD is stable in position. No acute osseous abnormality seen. IMPRESSION: 1. New airspace opacity at the right lung base. This could represent atelectasis, pneumonia or aspiration. If febrile, would certainly favor pneumonia. Suspect small adjacent right pleural effusion. 2. Cardiomegaly, stable. No evidence of significant volume overload/CHF. Electronically Signed   By: Stan  Maynard M.D.   On: 04/30/2015 08:59    PHYSICAL EXAM General: NAD Neck: JVP 8-9  no thyromegaly or thyroid nodule.  Lungs: Clear to auscultation bilaterally with normal respiratory effort. CV: Lateral PMI.  Heart regular S1/S2, ?soft S3, no murmur.  No peripheral edema.   Abdomen: Soft, nontender, no hepatosplenomegaly.  Mild  distention Neurologic: Alert and oriented x 3.  Psych: Normal affect. Extremities: No clubbing or cyanosis. RUE PICC  TELEMETRY: NSR with PVCs. 90-100s    ASSESSMENT AND PLAN: 49 yo with history of nonischemic cardiomyopathy since 2002 was admitted with volume overload and exertional dyspnea.   1. Acute on chronic systolic CHF: Nonischemic cardiomyopathy since 2002.  Echo (12/16) with EF 20-25%, moderate MR, moderately decreased RV systolic function.  Cath in 2002 at time of diagnosis with normal coronaries.  Has Medtronic ICD.  CO-OX 66% off milrinone but just turned off when this was checked.  CVP still measuring 16 but ?accuracy, weight down 11 lbs total. - No diuretics for now until post cath.  - Continue Coreg to 3.125 mg bid. - Continue digoxin, level  0.7   - No room for Ace/Arb due to hypotension.  - RHC/LHC  today  - Cardiac Rehab following.  - We discussed the possible need for advanced therapies down the road. Potential transplant candidate.  He would also be an LVAD candidate if needed. VAD coordinators met with him.   2. Elevated LFTs: Mild, noted at admission.  Suspect due to congestive hepatopathy.  3. Hypomagnesium- Mag pending.  4. Malnutrition- Prealbumin 8.6   Amy Clegg NP-C  05/04/2015 7:36 AM   Patient seen with NP, agree with the above note.   Milrinone stopped earlier this morning for RHC/LHC today.  CVP measured at 16 though ?accuracy as weight is down 11 lbs.  BP on the low side, no room for medication titration.  - Right and left heart cath today, will decide on need for ongoing milrinone and diuresis.   Analise Glotfelty 05/04/2015 8:01 AM    

## 2015-05-05 ENCOUNTER — Other Ambulatory Visit (HOSPITAL_COMMUNITY): Payer: Medicare Other

## 2015-05-05 ENCOUNTER — Encounter (HOSPITAL_COMMUNITY): Payer: Medicare Other

## 2015-05-05 ENCOUNTER — Ambulatory Visit: Payer: Medicare Other | Admitting: Cardiology

## 2015-05-05 LAB — ABO/RH
ABO/RH(D): A POS
PT AG Type: POSITIVE

## 2015-05-05 LAB — BASIC METABOLIC PANEL
Anion gap: 12 (ref 5–15)
BUN: 14 mg/dL (ref 6–20)
CO2: 32 mmol/L (ref 22–32)
Calcium: 8.7 mg/dL — ABNORMAL LOW (ref 8.9–10.3)
Chloride: 90 mmol/L — ABNORMAL LOW (ref 101–111)
Creatinine, Ser: 1.04 mg/dL (ref 0.61–1.24)
GFR calc Af Amer: >60 mL/min (ref >60)
GFR calc non Af Amer: >60 mL/min (ref >60)
Glucose, Bld: 128 mg/dL — ABNORMAL HIGH (ref 65–99)
Potassium: 3.3 mmol/L — ABNORMAL LOW (ref 3.5–5.1)
Sodium: 134 mmol/L — ABNORMAL LOW (ref 135–145)

## 2015-05-05 LAB — CBC
HCT: 41.1 % (ref 39.0–52.0)
Hemoglobin: 14.2 g/dL (ref 13.0–17.0)
MCH: 32.7 pg (ref 26.0–34.0)
MCHC: 34.5 g/dL (ref 30.0–36.0)
MCV: 94.7 fL (ref 78.0–100.0)
Platelets: 175 10*3/uL (ref 150–400)
RBC: 4.34 MIL/uL (ref 4.22–5.81)
RDW: 15.6 % — ABNORMAL HIGH (ref 11.5–15.5)
WBC: 5.9 10*3/uL (ref 4.0–10.5)

## 2015-05-05 LAB — HEMOGLOBIN A1C
Hgb A1c MFr Bld: 5.6 % (ref 4.8–5.6)
Mean Plasma Glucose: 114 mg/dL

## 2015-05-05 LAB — CARBOXYHEMOGLOBIN
Carboxyhemoglobin: 1.9 % — ABNORMAL HIGH (ref 0.5–1.5)
Carboxyhemoglobin: 1 % (ref 0.5–1.5)
Methemoglobin: 0.6 % (ref 0.0–1.5)
Methemoglobin: 0.8 % (ref 0.0–1.5)
O2 Saturation: 56.2 %
O2 Saturation: 37.7 %
Total hemoglobin: 14.7 g/dL (ref 13.5–18.0)
Total hemoglobin: 14.8 g/dL (ref 13.5–18.0)

## 2015-05-05 LAB — LUPUS ANTICOAGULANT PANEL
DRVVT: 36.2 s (ref 0.0–44.0)
PTT Lupus Anticoagulant: 35.8 s (ref 0.0–40.6)

## 2015-05-05 LAB — HEPATITIS C ANTIBODY: HCV Ab: <0.1 s/co ratio (ref 0.0–0.9)

## 2015-05-05 LAB — HEPATITIS B SURFACE ANTIGEN: Hepatitis B Surface Ag: NEGATIVE

## 2015-05-05 LAB — HEPATITIS B SURFACE ANTIBODY,QUALITATIVE: Hep B S Ab: NONREACTIVE

## 2015-05-05 LAB — HEPATITIS B CORE ANTIBODY, TOTAL: Hep B Core Total Ab: NEGATIVE

## 2015-05-05 MED ORDER — FUROSEMIDE 10 MG/ML IJ SOLN
INTRAMUSCULAR | Status: AC
  Administered 2015-05-05: 80 mg
  Filled 2015-05-05: qty 8

## 2015-05-05 MED ORDER — SPIRONOLACTONE 25 MG PO TABS
12.5000 mg | ORAL_TABLET | Freq: Every day | ORAL | Status: DC
  Administered 2015-05-05 – 2015-05-09 (×5): 12.5 mg via ORAL
  Filled 2015-05-05 (×5): qty 1

## 2015-05-05 MED ORDER — MILRINONE IN DEXTROSE 20 MG/100ML IV SOLN: 0.1250 ug/kg/min | INTRAVENOUS | Status: AC

## 2015-05-05 MED ORDER — MILRINONE IN DEXTROSE 20 MG/100ML IV SOLN
0.2500 ug/kg/min | INTRAVENOUS | Status: DC
  Administered 2015-05-05 – 2015-05-06 (×3): 0.25 ug/kg/min via INTRAVENOUS
  Administered 2015-05-07: 0.375 ug/kg/min via INTRAVENOUS
  Filled 2015-05-05 (×4): qty 100

## 2015-05-05 MED ORDER — POTASSIUM CHLORIDE CRYS ER 20 MEQ PO TBCR: 40.0000 meq | EXTENDED_RELEASE_TABLET | Freq: Three times a day (TID) | ORAL | Status: DC

## 2015-05-05 MED ORDER — CARVEDILOL 3.125 MG PO TABS: 3.1250 mg | ORAL_TABLET | Freq: Two times a day (BID) | ORAL | Status: DC

## 2015-05-05 MED ORDER — GUAIFENESIN-DM 100-10 MG/5ML PO SYRP
5.0000 mL | ORAL_SOLUTION | ORAL | Status: DC | PRN
  Administered 2015-05-06 – 2015-05-07 (×2): 5 mL via ORAL
  Filled 2015-05-05 (×2): qty 5

## 2015-05-05 MED ORDER — POTASSIUM CHLORIDE CRYS ER 20 MEQ PO TBCR
40.0000 meq | EXTENDED_RELEASE_TABLET | Freq: Three times a day (TID) | ORAL | Status: AC
  Administered 2015-05-05 (×2): 40 meq via ORAL
  Filled 2015-05-05 (×2): qty 2

## 2015-05-05 MED ORDER — FUROSEMIDE 10 MG/ML IJ SOLN: 80.0000 mg | Freq: Once | INTRAMUSCULAR | Status: AC

## 2015-05-05 MED ORDER — FUROSEMIDE 40 MG PO TABS
40.0000 mg | ORAL_TABLET | Freq: Every day | ORAL | Status: DC
Start: 2015-05-05 — End: 2015-05-05
  Administered 2015-05-05: 40 mg via ORAL
  Filled 2015-05-05: qty 1

## 2015-05-05 NOTE — Progress Notes (Signed)
Pt has an upset stomach, he believes it is from lovenox injection. Zofran administered at this time.

## 2015-05-05 NOTE — Progress Notes (Addendum)
Initial Nutrition Assessment  DOCUMENTATION CODES:   Not applicable  INTERVENTION:   -Continue with Heart Healthy diet  NUTRITION DIAGNOSIS:   No Nutrition dx at this time  GOAL:   Patient will meet greater than or equal to 90% of their needs  MONITOR:   PO intake, Supplement acceptance, Labs, Weight trends, Skin, I & O's  REASON FOR ASSESSMENT:   Consult  (LVAD evaluation)  ASSESSMENT:   50 year old male with long-standing history of chronic systolic heart failure, nonischemic cardiomyopathy, ICD, Dr. Gwenlyn Found, who has been experiencing increasing dyspnea consistent with acute on chronic systolic heart failure.  S/p Procedure(s) 05/04/15: Right/Left Heart Cath and Coronary Angiography (N/A)  RD consulted to LVAD evaluation. Attempted to see pt multiple times within the past 48 hours, however, pt was either in with other providers (social work, case management, MD) or in cath lab at times of visits.   Per meal completion records, pt is consuming 50-100%. Per RN, pt did have some nausea today, which pt suspects was from medications.   Reviewed wt hx, which revealed weight loss during this hospitalization due to diuresis. Wt hx difficult to assess due to hx of CHF. Noted pt was transitioned from IV to PO lasix today.  Unable to complete nutrition-focused physical exam at this time, however, pt did not appear to have signs of fat or muscle depletion.   RNCM following. Per MD notes, plan to d/c home tomorrow with home health on IV amiodarone.   Labs reviewed: Na: 134 (IV supplementation), K: 3.3 (on PO supplementation).   Diet Order:  Diet Heart Room service appropriate?: Yes; Fluid consistency:: Thin  Skin:  Reviewed, no issues  Last BM:  05/04/15  Height:   Ht Readings from Last 1 Encounters:  05/01/15 5\' 11"  (1.803 m)    Weight:   Wt Readings from Last 1 Encounters:  05/05/15 163 lb 6.4 oz (74.118 kg)    Ideal Body Weight:  78.2 kg  BMI:  Body mass index is  22.8 kg/(m^2).  Estimated Nutritional Needs:   Kcal:  1800-2000  Protein:  85-100 grams  Fluid:  1.8-2.0 L  EDUCATION NEEDS:   No education needs identified at this time  Amorina Doerr A. Jimmye Norman, RD, LDN, CDE Pager: [phone] After hours Pager: [phone]

## 2015-05-05 NOTE — Progress Notes (Signed)
Spoke with Patrick Humphrey, CT tech about pt coming to CT chest, abd, and pelvis. Will attempt in the AM due to pt not feeling well and 2 bottles of contrast needed to be drank. CT Tech will pass on to scan pt first thing in the morning 05/06/15

## 2015-05-05 NOTE — Progress Notes (Signed)
[facility]  Patient Status:  New patient for [facility] this hospital admission  [facility] is providing the following services: HHRN and home inotrope pharmacy team for home milrinone. [facility] hospital team will provide in hospital teaching for home HF /inotrope program. [facility] team will follow [name] with the HF team to support his DC home when deemed appropriate.   If patient discharges after hours, please call [phone].   Patrick Humphrey 05/05/2015, 10:30 PM

## 2015-05-05 NOTE — Care Management Note (Signed)
Case Management Note  Patient Details  Name: Patrick Humphrey MRN: [id number] Date of Birth: 02-Nov-1965  Subjective/Objective:      Adm w heart failure              Action/Plan: lives w fam, pcp dr Manuella Ghazi   Expected Discharge Date:                  Expected Discharge Plan:  [place]  In-House Referral:     Discharge planning Services  CM Consult  Post Acute Care Choice:  Durable Medical Equipment, Home Health Choice offered to:  Patient  DME Arranged:  IV pump/equipment DME Agency:  [place]:  RN, Disease Management [place] Agency:  [place]  Status of Service:     Medicare Important Message Given:  Yes Date Medicare IM Given:    Medicare IM give by:    Date Additional Medicare IM Given:    Additional Medicare Important Message give by:     If discussed at [place] of Stay Meetings, dates discussed:    Additional Comments:will need home milrinone. Went over agency list and no pref. Lives in [place] and has [employer]. Ref to pam w [facility] for home iv milrinone.  Lacretia Leigh, RN 05/05/2015, 3:10 PM

## 2015-05-05 NOTE — Progress Notes (Signed)
Pre op Dopplers not performed at this time. 2:30pm. Patient was going to be getting PFT's then going to [place], RDMS, RVT 05/05/2015

## 2015-05-05 NOTE — Progress Notes (Signed)
Pt has a nagging cough today. It is to the point where he feel as if he could vomit. RN checked pts med list to ACE inhibitors, none found. RN will continue to monitor.

## 2015-05-05 NOTE — Progress Notes (Signed)
Pt Coox 37 at this time. Paged Dr Aundra Dubin and orders to restart Milrinone at .25  Pt is still coughing up fluids and gagging like he wants vomit. RN will continue to monitor.

## 2015-05-05 NOTE — Progress Notes (Signed)
Patient ID: Patrick Humphrey, male   DOB: April 14, 1965, 50 y.o.   MRN: [id number]   SUBJECTIVE:   Admitted with marked volume overload. Diuresed with IV lasix and started on 0.25 mcg milrinone.   RHC/LHC No coronary disease RA mean 9  RV 47/11 PA 50/29 mean 36 PCWP mean 21 LV 80/21 AO 80/60 Oxygen saturations: PA 60% AO 97% Cardiac Output (Fick) 3.7  Cardiac Index (Fick) 1.9 PVR 4 WU  Scheduled Meds: . aspirin EC  81 mg Oral Daily  . carvedilol  3.125 mg Oral BID WC  . digoxin  0.125 mg Oral Daily  . enoxaparin (LOVENOX) injection  40 mg Subcutaneous Q24H  . furosemide  80 mg Intravenous BID  . loratadine  10 mg Oral Daily  . magnesium oxide  400 mg Oral Daily  . montelukast  10 mg Oral QHS  . pantoprazole  40 mg Oral Daily  . sodium chloride  10-40 mL Intracatheter Q12H  . sodium chloride  3 mL Intravenous Q12H   Continuous Infusions: . milrinone 0.25 mcg/kg/min (05/05/15 0227)   PRN Meds:.sodium chloride, acetaminophen, ALPRAZolam, ondansetron (ZOFRAN) IV, sodium chloride, sodium chloride, zolpidem    Filed Vitals:   05/04/15 2000 05/04/15 2349 05/05/15 0410 05/05/15 0413  BP:  103/79 119/82   Pulse:      Temp:  98.6 F (37 C)    TempSrc:  Oral    Resp:      Height:      Weight:    163 lb 6.4 oz (74.118 kg)  SpO2: 95% 97%      Intake/Output Summary (Last 24 hours) at 05/05/15 0726 Last data filed at 05/05/15 0600  Gross per 24 hour  Intake  970.4 ml  Output   3210 ml  Net -2239.6 ml    LABS: Basic Metabolic Panel:  Recent Labs  05/03/15 0529 05/04/15 0500 05/04/15 1050 05/05/15 0410  NA 136 134*  134*  --  134*  K 3.8 4.1  4.1  --  3.3*  CL 94* 91*  89*  --  90*  CO2 31 30  29   --  32  GLUCOSE 114* 92  101*  --  128*  BUN 16 15  12   --  14  CREATININE 1.08 1.03  1.08 1.03 1.04  CALCIUM 9.0 9.0  8.3*  --  8.7*  MG 1.8 2.1  --   --    Liver Function Tests: No results for input(s): AST, ALT, ALKPHOS, BILITOT, PROT, ALBUMIN in the  last 72 hours. No results for input(s): LIPASE, AMYLASE in the last 72 hours. CBC:  Recent Labs  05/04/15 1050 05/05/15 0410  WBC 6.2 5.9  HGB 13.0 14.2  HCT 38.0* 41.1  MCV 96.2 94.7  PLT 163 175   Cardiac Enzymes: No results for input(s): CKTOTAL, CKMB, CKMBINDEX, TROPONINI in the last 72 hours. BNP: Invalid input(s): POCBNP D-Dimer: No results for input(s): DDIMER in the last 72 hours. Hemoglobin A1C:  Recent Labs  05/04/15 0500  HGBA1C 5.6   Fasting Lipid Panel:  Recent Labs  05/04/15 0500  CHOL 87  HDL 26*  LDLCALC 53  TRIG 41  CHOLHDL 3.3   Thyroid Function Tests: No results for input(s): TSH, T4TOTAL, T3FREE, THYROIDAB in the last 72 hours.  Invalid input(s): FREET3 Anemia Panel: No results for input(s): VITAMINB12, FOLATE, FERRITIN, TIBC, IRON, RETICCTPCT in the last 72 hours.  RADIOLOGY: Dg Orthopantogram  05/04/2015  CLINICAL DATA:  Evaluate for left ventricular assist device placement.  Initial encounter. EXAM: ORTHOPANTOGRAM/PANORAMIC COMPARISON:  None. FINDINGS: There appears to be relatively recent absence of the right first mandibular molar. Remaining dentition is unremarkable in appearance. No periapical abscesses are seen. Dental hardware is noted at the left first mandibular molar. There is no evidence of fracture or dislocation. The visualized paranasal sinuses are well-aerated. IMPRESSION: No acute abnormality seen with regard to the dentition. Relatively recent absence of the right first mandibular molar, and dental hardware at the left first mandibular molar. Electronically Signed   By: Garald Balding M.D.   On: 05/04/2015 22:23   Dg Chest 2 View  04/30/2015  CLINICAL DATA:  Cough, allergy induced asthma, history of pacemaker, CHF, nonsmoker. EXAM: CHEST  2 VIEW COMPARISON:  Chest x-rays dated 04/23/2015 and 01/06/2015. FINDINGS: New airspace opacity is present at the right lung base. Suspect small adjacent right pleural effusion. Chronic mild  atelectasis/scarring noted at the left lung base. Mild cardiomegaly is unchanged. Right chest wall pacemaker/AICD is stable in position. No acute osseous abnormality seen. IMPRESSION: 1. New airspace opacity at the right lung base. This could represent atelectasis, pneumonia or aspiration. If febrile, would certainly favor pneumonia. Suspect small adjacent right pleural effusion. 2. Cardiomegaly, stable. No evidence of significant volume overload/CHF. Electronically Signed   By: Franki Cabot M.D.   On: 04/30/2015 08:59    PHYSICAL EXAM CVP ~5  General: NAD Neck: JVP 5-6  no thyromegaly or thyroid nodule.  Lungs: Clear to auscultation bilaterally with normal respiratory effort. CV: Lateral PMI.  Heart regular S1/S2, ?soft S3, no murmur.  No peripheral edema.   Abdomen: Soft, nontender, no hepatosplenomegaly.  Mild distention Neurologic: Alert and oriented x 3.  Psych: Normal affect. Extremities: No clubbing or cyanosis. RUE PICC  TELEMETRY: NSR with PVCs. 90-100s   ASSESSMENT AND PLAN: 50 yo with history of nonischemic cardiomyopathy since 2002 was admitted with volume overload and exertional dyspnea.   1. Acute on chronic systolic CHF: Nonischemic cardiomyopathy since 2002.  Echo (12/16) with EF 20-25%, moderate MR, moderately decreased RV systolic function.  Cath in 2002 at time of diagnosis with normal coronaries.  Has Medtronic ICD.  CO-OX 56% on milrinone 0.25 mcg. CVP 5.  - Cut back milrinone to 0.125 mcg, stop later today.  - Stop IV lasix. Start lasix 40 mg po daily.  - Cut back potassium to 40 meq twice a day. - Add lisinopril 2.5 mg daily.  - Continue Coreg to 3.125 mg bid. - Continue digoxin, level  0.7   - No room for Ace/Arb due to hypotension.  - We discussed the possible need for advanced therapies down the road. Potential transplant candidate.  He would also be an LVAD candidate if needed. VAD coordinators will meet with him today.  2. Elevated LFTs: Mild, noted at  admission.  Suspect due to congestive hepatopathy.  3. Hypomagnesium- Mag repleted.    Disposition: Home 24-48 +/- milrinone.   Amy Clegg NP-C  05/05/2015 7:26 AM   Patient seen with NP, agree with the above note.  RHC yesterday with marginal output off milrinone.  Milrinone restarted yesterday given ongoing volume overload. He diuresed well, weight down another 5 lbs and CVP now 5.  Will transition to po Lasix and low dose lisinopril.  Titrate off milrinone today, will check co-ox off milrinone.  If it is low, will arrange for home milrinone.  Possibly home tomorrow once decision regarding milrinone is made.   [name] 05/05/2015 7:42 AM

## 2015-05-05 NOTE — Care Management Important Message (Signed)
Important Message  Patient Details  Name: Patrick Humphrey MRN: [id number] Date of Birth: 20-Jan-1966   Medicare Important Message Given:  Yes    Patrick Humphrey Patrick Humphrey Patrick Humphrey 05/05/2015, 2:24 PM

## 2015-05-05 NOTE — Progress Notes (Signed)
Came to ambulate but now feels bad. HR 118 ST, coughing, feels weak. Walked 4 laps earlier. Will f/u tomorrow. Yves Dill CES, ACSM 3:10 PM 05/05/2015

## 2015-05-05 NOTE — Progress Notes (Signed)
  Off milrinone CO-OX down to 37%. Increase cough and nausea off milrinone. CVP has gone up from 5 to 13.    Start milrinone 0.25 mcg and give 80 mg IV lasix now. Repeat CO-OX in am.   Will need home milrinone. Set up [facility].    Amy Clegg NP-C  3:11 PM

## 2015-05-05 NOTE — Progress Notes (Signed)
Called Respiratory Therapy to get pts PFT rescheduled for 05/06/15. Pt does not feel well at this time.

## 2015-05-05 NOTE — Progress Notes (Signed)
CSW met at bedside with patient and wife to discuss LVAD Assessment process. CSW started assessment and will continue lengthy assessment with patient and wife on an outpatient basis through the AHF clinic post hospitalization.Raquel Sarna, [place]

## 2015-05-06 ENCOUNTER — Inpatient Hospital Stay (HOSPITAL_COMMUNITY): Payer: Medicare Other

## 2015-05-06 DIAGNOSIS — I509 Heart failure, unspecified: Secondary | ICD-10-CM

## 2015-05-06 DIAGNOSIS — Z01810 Encounter for preprocedural cardiovascular examination: Secondary | ICD-10-CM

## 2015-05-06 LAB — BASIC METABOLIC PANEL
Anion gap: 11 (ref 5–15)
Anion gap: 7 (ref 5–15)
Anion gap: 15 (ref 5–15)
BUN: 17 mg/dL (ref 6–20)
BUN: 14 mg/dL (ref 6–20)
BUN: 20 mg/dL (ref 6–20)
CO2: 31 mmol/L (ref 22–32)
CO2: 21 mmol/L — ABNORMAL LOW (ref 22–32)
CO2: 28 mmol/L (ref 22–32)
Calcium: 8.8 mg/dL — ABNORMAL LOW (ref 8.9–10.3)
Calcium: 5.2 mg/dL — CL (ref 8.9–10.3)
Calcium: 9.1 mg/dL (ref 8.9–10.3)
Chloride: 93 mmol/L — ABNORMAL LOW (ref 101–111)
Chloride: 108 mmol/L (ref 101–111)
Chloride: 86 mmol/L — ABNORMAL LOW (ref 101–111)
Creatinine, Ser: 1.24 mg/dL (ref 0.61–1.24)
Creatinine, Ser: 0.69 mg/dL (ref 0.61–1.24)
Creatinine, Ser: 1.26 mg/dL — ABNORMAL HIGH (ref 0.61–1.24)
GFR calc Af Amer: >60 mL/min (ref >60)
GFR calc Af Amer: >60 mL/min (ref >60)
GFR calc Af Amer: >60 mL/min (ref >60)
GFR calc non Af Amer: >60 mL/min (ref >60)
GFR calc non Af Amer: >60 mL/min (ref >60)
GFR calc non Af Amer: >60 mL/min (ref >60)
Glucose, Bld: 82 mg/dL (ref 65–99)
Glucose, Bld: 109 mg/dL — ABNORMAL HIGH (ref 65–99)
Glucose, Bld: 142 mg/dL — ABNORMAL HIGH (ref 65–99)
Potassium: 3.8 mmol/L (ref 3.5–5.1)
Potassium: 2.3 mmol/L — CL (ref 3.5–5.1)
Potassium: 3.8 mmol/L (ref 3.5–5.1)
Sodium: 135 mmol/L (ref 135–145)
Sodium: 136 mmol/L (ref 135–145)
Sodium: 129 mmol/L — ABNORMAL LOW (ref 135–145)

## 2015-05-06 LAB — POCT I-STAT, CHEM 8
BUN: 22 mg/dL — ABNORMAL HIGH (ref 6–20)
Calcium, Ion: 0.99 mmol/L — ABNORMAL LOW (ref 1.12–1.23)
Chloride: 90 mmol/L — ABNORMAL LOW (ref 101–111)
Creatinine, Ser: 1.1 mg/dL (ref 0.61–1.24)
Glucose, Bld: 128 mg/dL — ABNORMAL HIGH (ref 65–99)
HCT: 48 % (ref 39.0–52.0)
Hemoglobin: 16.3 g/dL (ref 13.0–17.0)
Potassium: 3.7 mmol/L (ref 3.5–5.1)
Sodium: 131 mmol/L — ABNORMAL LOW (ref 135–145)
TCO2: 28 mmol/L (ref 0–100)

## 2015-05-06 LAB — CARBOXYHEMOGLOBIN
Carboxyhemoglobin: 1.9 % — ABNORMAL HIGH (ref 0.5–1.5)
Carboxyhemoglobin: 1.5 % (ref 0.5–1.5)
Methemoglobin: 0.6 % (ref 0.0–1.5)
Methemoglobin: 0.7 % (ref 0.0–1.5)
O2 Saturation: 58.4 %
O2 Saturation: 53.2 %
Total hemoglobin: 12.8 g/dL — ABNORMAL LOW (ref 13.5–18.0)
Total hemoglobin: 14.1 g/dL (ref 13.5–18.0)

## 2015-05-06 LAB — PULMONARY FUNCTION TEST
DL/VA % pred: 108 %
DL/VA: 5.08 ml/min/mmHg/L
DLCO unc % pred: 64 %
DLCO unc: 21.84 ml/min/mmHg
FEF 25-75 Post: 2.11 L/sec
FEF 25-75 Pre: 1.54 L/sec
FEF2575-%Change-Post: 36 %
FEF2575-%Pred-Post: 59 %
FEF2575-%Pred-Pre: 43 %
FEV1-%Change-Post: 6 %
FEV1-%Pred-Post: 57 %
FEV1-%Pred-Pre: 54 %
FEV1-Post: 2.34 L
FEV1-Pre: 2.19 L
FEV1FVC-%Change-Post: 9 %
FEV1FVC-%Pred-Pre: 94 %
FEV6-%Change-Post: -3 %
FEV6-%Pred-Post: 56 %
FEV6-%Pred-Pre: 58 %
FEV6-Post: 2.83 L
FEV6-Pre: 2.93 L
FEV6FVC-%Change-Post: -1 %
FEV6FVC-%Pred-Post: 101 %
FEV6FVC-%Pred-Pre: 102 %
FVC-%Change-Post: -2 %
FVC-%Pred-Post: 55 %
FVC-%Pred-Pre: 57 %
FVC-Post: 2.9 L
FVC-Pre: 2.98 L
Post FEV1/FVC ratio: 80 %
Post FEV6/FVC ratio: 98 %
Pre FEV1/FVC ratio: 74 %
Pre FEV6/FVC Ratio: 99 %
RV % pred: 201 %
RV: 4.24 L
TLC % pred: 101 %
TLC: 7.25 L

## 2015-05-06 MED ORDER — ALBUTEROL SULFATE (2.5 MG/3ML) 0.083% IN NEBU
2.5000 mg | INHALATION_SOLUTION | Freq: Once | RESPIRATORY_TRACT | Status: AC
  Administered 2015-05-06: 2.5 mg via RESPIRATORY_TRACT
  Filled 2015-05-06: qty 3

## 2015-05-06 MED ORDER — IOHEXOL 300 MG/ML  SOLN
100.0000 mL | Freq: Once | INTRAMUSCULAR | Status: AC | PRN
  Administered 2015-05-06: 100 mL via INTRAVENOUS

## 2015-05-06 MED ORDER — FUROSEMIDE 10 MG/ML IJ SOLN
80.0000 mg | Freq: Once | INTRAMUSCULAR | Status: AC
Start: 2015-05-06 — End: 2015-05-06
  Administered 2015-05-06: 80 mg via INTRAVENOUS
  Filled 2015-05-06: qty 8

## 2015-05-06 MED ORDER — FUROSEMIDE 10 MG/ML IJ SOLN
80.0000 mg | Freq: Once | INTRAMUSCULAR | Status: AC
  Administered 2015-05-06: 80 mg via INTRAVENOUS
  Filled 2015-05-06: qty 8

## 2015-05-06 MED ORDER — BOOST / RESOURCE BREEZE PO LIQD
1.0000 | ORAL | Status: DC
  Administered 2015-05-06: 1 via ORAL

## 2015-05-06 MED ORDER — IOHEXOL 300 MG/ML  SOLN
25.0000 mL | INTRAMUSCULAR | Status: AC
  Administered 2015-05-06 (×2): 25 mL via ORAL

## 2015-05-06 MED ORDER — FUROSEMIDE 10 MG/ML IJ SOLN
80.0000 mg | Freq: Two times a day (BID) | INTRAMUSCULAR | Status: DC
  Administered 2015-05-06 – 2015-05-09 (×7): 80 mg via INTRAVENOUS
  Filled 2015-05-06 (×7): qty 8

## 2015-05-06 MED ORDER — ENSURE ENLIVE PO LIQD
237.0000 mL | Freq: Three times a day (TID) | ORAL | Status: DC
  Administered 2015-05-06: 237 mL via ORAL

## 2015-05-06 MED ORDER — NOREPINEPHRINE BITARTRATE 1 MG/ML IV SOLN
2.0000 ug/min | INTRAVENOUS | Status: DC
  Administered 2015-05-06: 2 ug/min via INTRAVENOUS
  Filled 2015-05-06 (×2): qty 16

## 2015-05-06 MED ORDER — FUROSEMIDE 10 MG/ML IJ SOLN
INTRAMUSCULAR | Status: AC
Start: 2015-05-06 — End: 2015-05-07
  Filled 2015-05-06: qty 8

## 2015-05-06 MED ORDER — ENSURE ENLIVE PO LIQD
237.0000 mL | ORAL | Status: DC
  Administered 2015-05-09: 237 mL via ORAL

## 2015-05-06 NOTE — Progress Notes (Signed)
Patient ID: Patrick Humphrey, male   DOB: 29-May-1965, 50 y.o.   MRN: [id number]  SBP dropped into 80s this evening, co-ox down to 53%. HR higher.  Developed cough.  Added norepinephrine 2 mcg/min. CVP 12, will continue IV Lasix 80 mg bid.    Tentative plan for LVAD next Friday but may need to adjust depending on situation.   Looks better on norepinephrine, no complaints this evening.   [name] 05/06/2015 9:47 PM

## 2015-05-06 NOTE — Progress Notes (Addendum)
CARDIAC REHAB PHASE I   PRE:  Rate/Rhythm: 110 ST resting     MODE:  Ambulation: 910 ft   POST:  Rate/Rhythm: 127 ST    BP: sitting 101/75   Pt just back from PFTs.  Eager to walk. Ambulated 790 ft in 6 min. C/o weak legs and coughing but feels better than yesterday. Has been walking with his wife when he feels well. HR up. To bed. Discussed low sodium and weights if he goes home. If he stays for LVAD he needs an IS. Will f/u. [id number]  Josephina Shih [place]ais CES, ACSM 05/06/2015 11:45 AM

## 2015-05-06 NOTE — Progress Notes (Signed)
Nutrition Follow-up  DOCUMENTATION CODES:   Not applicable  INTERVENTION:   -Ensure Enlive po daily, each supplement provides 350 kcal and 20 grams of protein -Boost Breeze po daily, each supplement provides 250 kcal and 9 grams of protein -Reviewed low sodium diet guidelines   NUTRITION DIAGNOSIS:   Inadequate oral intake related to poor appetite as evidenced by per patient/family report.  Progressing  GOAL:   Patient will meet greater than or equal to 90% of their needs  Progression  MONITOR:   PO intake, Supplement acceptance, Labs, Weight trends, Skin, I & O's  REASON FOR ASSESSMENT:   Consult Assessment of nutrition requirement/status  ASSESSMENT:   50 year old male with long-standing history of chronic systolic heart failure, nonischemic cardiomyopathy, ICD, Dr. Gwenlyn Found, who has been experiencing increasing dyspnea consistent with acute on chronic systolic heart failure.  RD re-consulted for LVAD evaluation and to evaluate for low prealbumin.   Spoke with pt at bedside, who expressed frustration over his current medical state, as he feels overwhelmed by his illness and the amount of information he has received thus far.   He describes a general decline in health over the past 6 months. He reports that PTA he would "eat whatever I wanted", but meals would generally consist of baked proteins and vegetables. He reports he has been much less active in the past several months, due to feeling weak. Pt shares he used to do the cooking for his household, but is no longer able to do this.   Pt and pt wife share that pt's intake has been variable over the past few days, due pt feeling sleepy and restrictions on the Heart Healthy diet. This RD reviewed principles of Heart Healthy diet extensively with pt and went over menu choices that were conducive to diet restrictions and pt's preferences. RD contacted nutritional services department to change pt's dinner meal order for  tonight. PTA, pt was consuming one Ensure supplement daily, but now complains of thick texture and quantity of supplements ordered, but expressed knowledge of importance of good nutrition to support healing process. He expressed concern about protein intake and muscle loss related to inactivity.    Pt reports UBW is around 217 and endorses a 20# wt loss during the hospitalization related to diuresis.   Nutrition-Focused physical exam completed. Findings are no fat depletion, mild muscle depletion (lower extremities only, likely related to decline in mobility), and no edema.   Per pt, will likely undergo LVAD next week.   Labs reviewed.   Diet Order:  Diet Heart Room service appropriate?: Yes; Fluid consistency:: Thin  Skin:  Reviewed, no issues  Last BM:  05/06/15  Height:   Ht Readings from Last 1 Encounters:  05/01/15 5\' 11"  (1.803 m)    Weight:   Wt Readings from Last 1 Encounters:  05/06/15 162 lb 6.4 oz (73.664 kg)    Ideal Body Weight:  78.2 kg  BMI:  Body mass index is 22.66 kg/(m^2).  Estimated Nutritional Needs:   Kcal:  1800-2000  Protein:  85-100 grams  Fluid:  1.8-2.0 L  EDUCATION NEEDS:   Education needs addressed  Lysbeth Dicola A. Jimmye Norman, RD, LDN, CDE Pager: [phone] After hours Pager: [phone]

## 2015-05-06 NOTE — Progress Notes (Signed)
Pt was given 80 mg IV lasix this AM with only 600cc UOP. CVP checked and it was 10. Paged Heart Failure NP ordered to give 80 more IV lasix. Medication given. Will continue to assess and monitor the pt closely.

## 2015-05-06 NOTE — Progress Notes (Signed)
Notified Heart Failure NP about pt. Pt states " not feeling well" Milrinone still going at 0.25 mcg/kg/hr. CVP 10. HR 115-125. UOP only 200 cc since second dose of IV lasix. NP called and ordered a COOX stat. Sent coox to Respiratory. Also orders placed to start norepi gtt and transfer to ICU. Night shift nurse made aware of changes. Will transfer pt to [place].

## 2015-05-06 NOTE — Progress Notes (Signed)
Pre-op Cardiac Surgery  Carotid Duplex=  Bilateral: No significant ICA stenosis. Antegrade vertebral flow.    Venous Duplex = No evidence of DVT or baker's cyst.   ABI=  Within normal limits. Lower  Extremity Right Left  Dorsalis Pedis 112, triphasic 111, triphasic  Anterior Tibial    Posterior Tibial 109, biphasic 113, biphasic  Ankle/Brachial Indices 1.18 1.19     Landry Mellow, RDMS, RVT 05/06/2015

## 2015-05-06 NOTE — Progress Notes (Signed)
Patient ID: Patrick Humphrey, male   DOB: Mar 07, 1966, 50 y.o.   MRN: [id number]   SUBJECTIVE:   Admitted with marked volume overload. Diuresed with IV lasix and started on 0.25 mcg milrinone.   Yesterday he failed milrinone wean.  CO-OX down to 37% and CVP up 13. Restarted on milrinone 0.25 mcg and IV lasix was restarted. BB was held.   Over night feeling better.   RHC/LHC No coronary disease RA mean 9  RV 47/11 PA 50/29 mean 36 PCWP mean 21 LV 80/21 AO 80/60 Oxygen saturations: PA 60% AO 97% Cardiac Output (Fick) 3.7  Cardiac Index (Fick) 1.9 PVR 4 WU  Scheduled Meds: . aspirin EC  81 mg Oral Daily  . carvedilol  3.125 mg Oral BID WC  . digoxin  0.125 mg Oral Daily  . enoxaparin (LOVENOX) injection  40 mg Subcutaneous Q24H  . loratadine  10 mg Oral Daily  . magnesium oxide  400 mg Oral Daily  . montelukast  10 mg Oral QHS  . pantoprazole  40 mg Oral Daily  . sodium chloride  10-40 mL Intracatheter Q12H  . sodium chloride  3 mL Intravenous Q12H  . spironolactone  12.5 mg Oral Daily   Continuous Infusions: . milrinone 0.25 mcg/kg/min (05/06/15 0254)   PRN Meds:.sodium chloride, acetaminophen, ALPRAZolam, guaiFENesin-dextromethorphan, ondansetron (ZOFRAN) IV, sodium chloride, sodium chloride, zolpidem    Filed Vitals:   05/05/15 1600 05/05/15 2029 05/06/15 0000 05/06/15 0400  BP: 95/82 84/66 81/52  96/72  Pulse:   91 88  Temp:  98.5 F (36.9 C) 97.6 F (36.4 C) 97.7 F (36.5 C)  TempSrc:  Oral Oral Oral  Resp:  18 16 12   Height:      Weight:    162 lb 6.4 oz (73.664 kg)  SpO2:  98% 99% 92%    Intake/Output Summary (Last 24 hours) at 05/06/15 0656 Last data filed at 05/06/15 0600  Gross per 24 hour  Intake  393.9 ml  Output   1540 ml  Net -1146.1 ml    LABS: Basic Metabolic Panel:  Recent Labs  05/04/15 0500  05/05/15 0410 05/06/15 0410  NA 134*  134*  --  134* 135  K 4.1  4.1  --  3.3* 3.8  CL 91*  89*  --  90* 93*  CO2 30  29  --  32  31  GLUCOSE 92  101*  --  128* 82  BUN 15  12  --  14 17  CREATININE 1.03  1.08  < > 1.04 1.24  CALCIUM 9.0  8.3*  --  8.7* 8.8*  MG 2.1  --   --   --   < > = values in this interval not displayed. Liver Function Tests: No results for input(s): AST, ALT, ALKPHOS, BILITOT, PROT, ALBUMIN in the last 72 hours. No results for input(s): LIPASE, AMYLASE in the last 72 hours. CBC:  Recent Labs  05/04/15 1050 05/05/15 0410  WBC 6.2 5.9  HGB 13.0 14.2  HCT 38.0* 41.1  MCV 96.2 94.7  PLT 163 175   Cardiac Enzymes: No results for input(s): CKTOTAL, CKMB, CKMBINDEX, TROPONINI in the last 72 hours. BNP: Invalid input(s): POCBNP D-Dimer: No results for input(s): DDIMER in the last 72 hours. Hemoglobin A1C:  Recent Labs  05/04/15 0500  HGBA1C 5.6   Fasting Lipid Panel:  Recent Labs  05/04/15 0500  CHOL 87  HDL 26*  LDLCALC 53  TRIG 41  CHOLHDL 3.3   Thyroid  Function Tests: No results for input(s): TSH, T4TOTAL, T3FREE, THYROIDAB in the last 72 hours.  Invalid input(s): FREET3 Anemia Panel: No results for input(s): VITAMINB12, FOLATE, FERRITIN, TIBC, IRON, RETICCTPCT in the last 72 hours.  RADIOLOGY: Dg Orthopantogram  05/04/2015  CLINICAL DATA:  Evaluate for left ventricular assist device placement. Initial encounter. EXAM: ORTHOPANTOGRAM/PANORAMIC COMPARISON:  None. FINDINGS: There appears to be relatively recent absence of the right first mandibular molar. Remaining dentition is unremarkable in appearance. No periapical abscesses are seen. Dental hardware is noted at the left first mandibular molar. There is no evidence of fracture or dislocation. The visualized paranasal sinuses are well-aerated. IMPRESSION: No acute abnormality seen with regard to the dentition. Relatively recent absence of the right first mandibular molar, and dental hardware at the left first mandibular molar. Electronically Signed   By: Garald Balding M.D.   On: 05/04/2015 22:23   Dg Chest 2  View  04/30/2015  CLINICAL DATA:  Cough, allergy induced asthma, history of pacemaker, CHF, nonsmoker. EXAM: CHEST  2 VIEW COMPARISON:  Chest x-rays dated 04/23/2015 and 01/06/2015. FINDINGS: New airspace opacity is present at the right lung base. Suspect small adjacent right pleural effusion. Chronic mild atelectasis/scarring noted at the left lung base. Mild cardiomegaly is unchanged. Right chest wall pacemaker/AICD is stable in position. No acute osseous abnormality seen. IMPRESSION: 1. New airspace opacity at the right lung base. This could represent atelectasis, pneumonia or aspiration. If febrile, would certainly favor pneumonia. Suspect small adjacent right pleural effusion. 2. Cardiomegaly, stable. No evidence of significant volume overload/CHF. Electronically Signed   By: Franki Cabot M.D.   On: 04/30/2015 08:59    PHYSICAL EXAM CVP 9 General: NAD. In bed.  Neck: JVP 9-10  no thyromegaly or thyroid nodule.  Lungs: Clear to auscultation bilaterally with normal respiratory effort. CV: Lateral PMI.  Heart regular S1/S2, ?soft S3, no murmur.  No peripheral edema.   Abdomen: Soft, nontender, no hepatosplenomegaly.  Mild distention Neurologic: Alert and oriented x 3.  Psych: Normal affect. Extremities: No clubbing or cyanosis. LUE PICC  TELEMETRY: NSR with PVCs. 90-100s    ASSESSMENT AND PLAN: 50 yo with history of nonischemic cardiomyopathy since 2002 was admitted with volume overload and exertional dyspnea.   1. Acute on chronic systolic CHF: Nonischemic cardiomyopathy since 2002.  Echo (12/16) with EF 20-25%, moderate MR, moderately decreased RV systolic function.  Cath in 2002 at time of diagnosis with normal coronaries.  Has [employer] ICD.  Failed milrinone wean. CO-OX down to 37% and CVP back up to 13.  Milrinone restarted at 0.25 mcg. Todays CO-OX is 58%. He will need home milrinone.  CVP 9.  - Continue milrinone 0.25 mcg/kg/min. - Give 80 mg IV Iasix x 1. Anticipate switching to po  lasix tomorrow.  Continue potassium to 40 meq twice a day. - Hold lisinopril 2.5 mg for now with soft BP.  - Hold  Coreg for now with low output.   - Continue digoxin, level  0.7   - Continue LVAD work up. CT abd/pelvis/chest completed earlier this morning. PFTs later today. Needs GI work up for LVAD.  He is now milrinone-dependent, will need LVAD implantation and down the road transplant evaluation.  2. Elevated LFTs: Mild, noted at admission.  Suspect due to congestive hepatopathy.  3. Hypomagnesium: Mag repleted.    Amy Clegg NP-C  05/06/2015 6:56 AM   Patient seen with NP, agree with the above note.  He did very poorly with milrinone wean, co-ox to  37% and felt awful.  Back on milrinone, feels much better.  He is now milrinone-dependent.  Will give one more dose of IV Lasix then to po Lasix tomorrow as above.  I would favor LVAD implantation soon rather than a long wait on home milrinone for transplant evaluation, etc.  Will continue LVAD workup, have GI see him today.  Will speak with Dr Prescott Gum about timing.   [name] 05/06/2015 7:53 AM

## 2015-05-06 NOTE — Consult Note (Addendum)
Reason for Consult: GI work up for LVAD placement Referring Physician: Cardiology  Patrick Humphrey HPI: This is a 50 year old male with a PMH of NICM since 2--2, s/p AICD, HTN, and history of prostate cancer admitted with CHF.  His BNP was at 11,000.  He received 2 liters of normal saline at [facility] when he presented with hypotension.  During his hospitalization at [facility] he was diuresed and started on milrinone.  He has not tolerated milrinone weans and now he is dependent on the medication.  The plan now is to push ahead for LVAD placement and he needs to be cleared from the GI tract.  On 01/21/2015 he was evaluated by Dr. Britta Mccreedy at [facility] for complaints of dysphagia.  A mild antral gastritis was found as well as a mild distal esophageal stricture.  Savary dilation with an 18 mm dilator was performed without complication, but in retrospect, he thinks that it was secondary to his enlarged heart.  There is no history of melena, hematochezia, constipation, or diarrhea.  No family history of colon cancer and he denies a prior colonoscopy.  Past Medical History  Diagnosis Date  . Nonischemic cardiomyopathy ([place])   . ICD (implantable cardioverter-defibrillator), single, in situ 08/26/2007  . Hx of echocardiogram 11/09/1910    Showed an EF of 25% with no significant valve disease.  Marland Kitchen AICD (automatic cardioverter/defibrillator) present   . Presence of permanent cardiac pacemaker   . CHF (congestive heart failure) [facility]     Past Surgical History  Procedure Laterality Date  . Appendectomy    . Pacemaker insertion  08/26/2007    place by Dr. Alroy Dust at [facility]  . Cardiac catheterization  2002    Normal coronary arteries  . Cardiac catheterization N/A 05/04/2015    Procedure: Right/Left Heart Cath and Coronary Angiography;  Surgeon: Larey Dresser, MD;  Location: [place] [facility];  Service: Cardiovascular;  Laterality: N/A;    Family History  Problem  Relation Age of Onset  . Hypertension Mother   . Hypertension Father   . Cancer - Prostate Maternal Grandfather     Social History:  reports that he quit smoking about 17 years ago. He does not have any smokeless tobacco history on file. He reports that he drinks alcohol. He reports that he does not use illicit drugs.  Allergies:  Allergies  Allergen Reactions  . Penicillins     Has patient had a PCN reaction causing immediate rash, facial/tongue/throat swelling, SOB or lightheadedness with hypotension: Yes Has patient had a PCN reaction causing severe rash involving mucus membranes or skin necrosis: No Has patient had a PCN reaction that required hospitalization No Has patient had a PCN reaction occurring within the last 10 years: No If all of the above answers are "NO", then may proceed with Cephalosporin use.  Unknown-childhood  . Sulfa Antibiotics     Medications:  Scheduled: . aspirin EC  81 mg Oral Daily  . digoxin  0.125 mg Oral Daily  . enoxaparin (LOVENOX) injection  40 mg Subcutaneous Q24H  . feeding supplement (ENSURE ENLIVE)  237 mL Oral TID WC  . loratadine  10 mg Oral Daily  . magnesium oxide  400 mg Oral Daily  . montelukast  10 mg Oral QHS  . pantoprazole  40 mg Oral Daily  . sodium chloride  10-40 mL Intracatheter Q12H  . sodium chloride  3 mL Intravenous Q12H  . spironolactone  12.5 mg Oral Daily  Continuous: . milrinone 0.25 mcg/kg/min (05/06/15 0254)    Results for orders placed or performed during the hospital encounter of 04/29/15 (from the past 24 hour(s))  Carboxyhemoglobin     Status: None   Collection Time: 05/05/15  2:10 PM  Result Value Ref Range   Total hemoglobin 14.8 13.5 - 18.0 g/dL   O2 Saturation 37.7 %   Carboxyhemoglobin 1.0 0.5 - 1.5 %   Methemoglobin 0.8 0.0 - 1.5 %  Basic metabolic panel     Status: Abnormal   Collection Time: 05/06/15  4:10 AM  Result Value Ref Range   Sodium 135 135 - 145 mmol/L   Potassium 3.8 3.5 - 5.1  mmol/L   Chloride 93 (L) 101 - 111 mmol/L   CO2 31 22 - 32 mmol/L   Glucose, Bld 82 65 - 99 mg/dL   BUN 17 6 - 20 mg/dL   Creatinine, Ser 1.24 0.61 - 1.24 mg/dL   Calcium 8.8 (L) 8.9 - 10.3 mg/dL   GFR calc non Af Amer >60 >60 mL/min   GFR calc Af Amer >60 >60 mL/min   Anion gap 11 5 - 15  Carboxyhemoglobin     Status: Abnormal   Collection Time: 05/06/15  4:15 AM  Result Value Ref Range   Total hemoglobin 12.8 (L) 13.5 - 18.0 g/dL   O2 Saturation 58.4 %   Carboxyhemoglobin 1.9 (H) 0.5 - 1.5 %   Methemoglobin 0.6 0.0 - 1.5 %     Dg Orthopantogram  05/04/2015  CLINICAL DATA:  Evaluate for left ventricular assist device placement. Initial encounter. EXAM: ORTHOPANTOGRAM/PANORAMIC COMPARISON:  None. FINDINGS: There appears to be relatively recent absence of the right first mandibular molar. Remaining dentition is unremarkable in appearance. No periapical abscesses are seen. Dental hardware is noted at the left first mandibular molar. There is no evidence of fracture or dislocation. The visualized paranasal sinuses are well-aerated. IMPRESSION: No acute abnormality seen with regard to the dentition. Relatively recent absence of the right first mandibular molar, and dental hardware at the left first mandibular molar. Electronically Signed   By: Garald Balding M.D.   On: 05/04/2015 22:23   Ct Chest W Contrast  05/06/2015  CLINICAL DATA:  Cardiomyopathy.  Preop for cardiac transplant. EXAM: CT CHEST, ABDOMEN, AND PELVIS WITH CONTRAST TECHNIQUE: Multidetector CT imaging of the chest, abdomen and pelvis was performed following the standard protocol during bolus administration of intravenous contrast. CONTRAST:  159mL OMNIPAQUE IOHEXOL 300 MG/ML  SOLN COMPARISON:  03/02/2012 FINDINGS: CT CHEST There are small mediastinal nodes. None are greater than 10 mm in short axis diameter. Aorta is non aneurysmal and patent. No evidence of dissection or intramural hematoma. There is no obvious evidence of acute  pulmonary thromboembolism. There is no evidence of enlargement of the pulmonary arterial structures to suggest pulmonary hypertension. The right ventricle and right atrium are normal in size. The left ventricle is markedly dilated with thinning of the myocardium. Left atrium is mildly dilated. Right subclavian pacemaker device is in place. Tips of the leads are in the right atrium and right ventricle. No pericardial effusion. There is a small amount of gas in the right atrium likely related to venous injection. Moderate right pleural effusion.  Small left pleural effusion. Minimal dependent atelectasis towards the lung bases. There is some peribronchovascular soft tissue thickening in the hilar regions which may be related to volume overload or an chronic bronchitic changes. No vertebral compression deformity. CT ABDOMEN AND PELVIS Liver, spleen, pancreas, and  adrenal glands are within normal limits. There is high-density material in the gallbladder compatible with either sludge or milk of calcium bile There is a small wedge-shaped area of low density in the mid right kidney worrisome for a small renal infarct. See image 32 of series 301. Unremarkable appearance of the left kidney. No hydronephrosis. There is no free intraperitoneal fluid. There is stranding in the perirectal and presacral space is within the pelvis of unknown significance. Bladder is unremarkable. Prostate is mildly enlarged. Gas in the subcutaneous fat of the left lower quadrant likely reflects subcutaneous injection. No vertebral compression deformity. IMPRESSION: There is no convincing evidence of malignancy, pulmonary embolism or pulmonary hypertension. There is dilatation of the left ventricle consistent with a history of cardiomyopathy Bilateral pleural effusions as described. Non pathologically enlarged lymph nodes and peribronchovascular soft tissue thickening are noted. This most likely reflects volume overload or an inflammatory process.  Small right renal infarct. Stranding within the perirectal fat. Differential diagnosis includes volume overload or an inflammatory process. Electronically Signed   By: Marybelle Killings M.D.   On: 05/06/2015 08:05   Ct Abdomen Pelvis W Contrast  05/06/2015  CLINICAL DATA:  Cardiomyopathy.  Preop for cardiac transplant. EXAM: CT CHEST, ABDOMEN, AND PELVIS WITH CONTRAST TECHNIQUE: Multidetector CT imaging of the chest, abdomen and pelvis was performed following the standard protocol during bolus administration of intravenous contrast. CONTRAST:  175mL OMNIPAQUE IOHEXOL 300 MG/ML  SOLN COMPARISON:  03/02/2012 FINDINGS: CT CHEST There are small mediastinal nodes. None are greater than 10 mm in short axis diameter. Aorta is non aneurysmal and patent. No evidence of dissection or intramural hematoma. There is no obvious evidence of acute pulmonary thromboembolism. There is no evidence of enlargement of the pulmonary arterial structures to suggest pulmonary hypertension. The right ventricle and right atrium are normal in size. The left ventricle is markedly dilated with thinning of the myocardium. Left atrium is mildly dilated. Right subclavian pacemaker device is in place. Tips of the leads are in the right atrium and right ventricle. No pericardial effusion. There is a small amount of gas in the right atrium likely related to venous injection. Moderate right pleural effusion.  Small left pleural effusion. Minimal dependent atelectasis towards the lung bases. There is some peribronchovascular soft tissue thickening in the hilar regions which may be related to volume overload or an chronic bronchitic changes. No vertebral compression deformity. CT ABDOMEN AND PELVIS Liver, spleen, pancreas, and adrenal glands are within normal limits. There is high-density material in the gallbladder compatible with either sludge or milk of calcium bile There is a small wedge-shaped area of low density in the mid right kidney worrisome for a  small renal infarct. See image 32 of series 301. Unremarkable appearance of the left kidney. No hydronephrosis. There is no free intraperitoneal fluid. There is stranding in the perirectal and presacral space is within the pelvis of unknown significance. Bladder is unremarkable. Prostate is mildly enlarged. Gas in the subcutaneous fat of the left lower quadrant likely reflects subcutaneous injection. No vertebral compression deformity. IMPRESSION: There is no convincing evidence of malignancy, pulmonary embolism or pulmonary hypertension. There is dilatation of the left ventricle consistent with a history of cardiomyopathy Bilateral pleural effusions as described. Non pathologically enlarged lymph nodes and peribronchovascular soft tissue thickening are noted. This most likely reflects volume overload or an inflammatory process. Small right renal infarct. Stranding within the perirectal fat. Differential diagnosis includes volume overload or an inflammatory process. Electronically Signed   By:  Marybelle Killings M.D.   On: 05/06/2015 08:05    ROS:  As stated above in the HPI otherwise negative.  Blood pressure 103/73, pulse 109, temperature 97.8 F (36.6 C), temperature source Oral, resp. rate 20, height 5\' 11"  (1.803 m), weight 73.664 kg (162 lb 6.4 oz), SpO2 96 %.    PE: Gen: NAD, Alert and Oriented HEENT:  [place]/AT, EOMI Neck: Supple, no LAD Lungs: CTA Bilaterally CV: RRR tachycardic, faint tones, ABM: Soft, NTND, +BS Ext: No C/C/E  Assessment/Plan: 1) Screening colonoscopy. 2) Midly abnormal liver enzymes - congestive hepatopathy. 3) CHF.   With milrinone he appears well.  He denies having a prior colonoscopy and a screening can be performed for him during this admission in anticipation for his LVAD.  I think it will also be prudent to repeat the EGD as a further look into the duodenum will be performed.    Plan: 1) EGD/Colonoscopy with anesthesia on 05/09/2015.  Patrick Humphrey D 05/06/2015, 11:09  AM

## 2015-05-07 DIAGNOSIS — E876 Hypokalemia: Secondary | ICD-10-CM

## 2015-05-07 LAB — CARBOXYHEMOGLOBIN
Carboxyhemoglobin: 1.7 % — ABNORMAL HIGH (ref 0.5–1.5)
Carboxyhemoglobin: 2 % — ABNORMAL HIGH (ref 0.5–1.5)
Carboxyhemoglobin: 2.1 % — ABNORMAL HIGH (ref 0.5–1.5)
Methemoglobin: 0.7 % (ref 0.0–1.5)
Methemoglobin: 0.6 % (ref 0.0–1.5)
Methemoglobin: 0.7 % (ref 0.0–1.5)
O2 Saturation: 44.2 %
O2 Saturation: 50.9 %
O2 Saturation: 57.4 %
Total hemoglobin: 14 g/dL (ref 13.5–18.0)
Total hemoglobin: 14.2 g/dL (ref 13.5–18.0)
Total hemoglobin: 14.4 g/dL (ref 13.5–18.0)

## 2015-05-07 LAB — BASIC METABOLIC PANEL
Anion gap: 9 (ref 5–15)
Anion gap: 11 (ref 5–15)
BUN: 14 mg/dL (ref 6–20)
BUN: 18 mg/dL (ref 6–20)
CO2: 24 mmol/L (ref 22–32)
CO2: 31 mmol/L (ref 22–32)
Calcium: 6.1 mg/dL — CL (ref 8.9–10.3)
Calcium: 8.8 mg/dL — ABNORMAL LOW (ref 8.9–10.3)
Chloride: 106 mmol/L (ref 101–111)
Chloride: 90 mmol/L — ABNORMAL LOW (ref 101–111)
Creatinine, Ser: 0.8 mg/dL (ref 0.61–1.24)
Creatinine, Ser: 1.26 mg/dL — ABNORMAL HIGH (ref 0.61–1.24)
GFR calc Af Amer: >60 mL/min (ref >60)
GFR calc Af Amer: >60 mL/min (ref >60)
GFR calc non Af Amer: >60 mL/min (ref >60)
GFR calc non Af Amer: >60 mL/min (ref >60)
Glucose, Bld: 97 mg/dL (ref 65–99)
Glucose, Bld: 113 mg/dL — ABNORMAL HIGH (ref 65–99)
Potassium: 2.4 mmol/L — CL (ref 3.5–5.1)
Potassium: 3.5 mmol/L (ref 3.5–5.1)
Sodium: 139 mmol/L (ref 135–145)
Sodium: 132 mmol/L — ABNORMAL LOW (ref 135–145)

## 2015-05-07 MED ORDER — POTASSIUM CHLORIDE CRYS ER 20 MEQ PO TBCR
40.0000 meq | EXTENDED_RELEASE_TABLET | Freq: Once | ORAL | Status: AC
  Administered 2015-05-07: 40 meq via ORAL
  Filled 2015-05-07: qty 2

## 2015-05-07 NOTE — Progress Notes (Signed)
CRITICAL VALUE ALERT  Critical value received:  K+ is 2.4        Ca+ is 6.1  Date of notification:  05/07/15  Time of notification:  5:45  Critical value read back:Yes.    Nurse who received alert:  Cleotis Nipper, RN  MD notified (1st page):  Rosanna Randy  Time of first page:  5:48

## 2015-05-07 NOTE — Progress Notes (Addendum)
Patient ID: Patrick Humphrey, male   DOB: 1966/02/22, 50 y.o.   MRN: [id number]   SUBJECTIVE:   Admitted with marked volume overload. Diuresed with IV lasix and started on 0.25 mcg milrinone.   Failed milrinone wean on 1/26 with  CO-OX down to 37% and CVP up 13. Restarted on milrinone 0.25 mcg and IV lasix was restarted.   Last night transferred to ICU for hypotension and worsening HF. Started on norepi at 2. Feeling better however co-ox down to 44%. Creatinine stable at 1.2  Weight down 1 pound to 161.  HR 100-110 (sinus tach with frequent PVCs). CVP 12  RHC/LHC No coronary disease RA mean 9  RV 47/11 PA 50/29 mean 36 PCWP mean 21 LV 80/21 AO 80/60 Oxygen saturations: PA 60% AO 97% Cardiac Output (Fick) 3.7  Cardiac Index (Fick) 1.9 PVR 4 WU  Scheduled Meds: . aspirin EC  81 mg Oral Daily  . digoxin  0.125 mg Oral Daily  . enoxaparin (LOVENOX) injection  40 mg Subcutaneous Q24H  . feeding supplement  1 Container Oral Q24H  . feeding supplement (ENSURE ENLIVE)  237 mL Oral Q24H  . furosemide  80 mg Intravenous BID  . loratadine  10 mg Oral Daily  . magnesium oxide  400 mg Oral Daily  . montelukast  10 mg Oral QHS  . pantoprazole  40 mg Oral Daily  . sodium chloride  10-40 mL Intracatheter Q12H  . sodium chloride  3 mL Intravenous Q12H  . spironolactone  12.5 mg Oral Daily   Continuous Infusions: . milrinone 0.25 mcg/kg/min (05/06/15 2111)  . norepinephrine (LEVOPHED) Adult infusion 2 mcg/min (05/06/15 2054)   PRN Meds:.sodium chloride, acetaminophen, ALPRAZolam, guaiFENesin-dextromethorphan, ondansetron (ZOFRAN) IV, sodium chloride, sodium chloride, zolpidem    Filed Vitals:   05/07/15 0400 05/07/15 0500 05/07/15 0600 05/07/15 0757  BP: 116/65 105/76 106/78   Pulse: 109 106 104 108  Temp: 98.8 F (37.1 C)   98.7 F (37.1 C)  TempSrc: Oral   Oral  Resp: 18     Height:      Weight:  73.12 kg (161 lb 3.2 oz)    SpO2: 94% 89% 94% 96%    Intake/Output  Summary (Last 24 hours) at 05/07/15 0800 Last data filed at 05/07/15 0600  Gross per 24 hour  Intake 1100.49 ml  Output   1575 ml  Net -474.51 ml    LABS: Basic Metabolic Panel:  Recent Labs  05/07/15 0500 05/07/15 0605  NA 139 132*  K 2.4* 3.5  CL 106 90*  CO2 24 31  GLUCOSE 97 113*  BUN 14 18  CREATININE 0.80 1.26*  CALCIUM 6.1* 8.8*   Liver Function Tests: No results for input(s): AST, ALT, ALKPHOS, BILITOT, PROT, ALBUMIN in the last 72 hours. No results for input(s): LIPASE, AMYLASE in the last 72 hours. CBC:  Recent Labs  05/04/15 1050 05/05/15 0410 05/06/15 2235  WBC 6.2 5.9  --   HGB 13.0 14.2 16.3  HCT 38.0* 41.1 48.0  MCV 96.2 94.7  --   PLT 163 175  --    Cardiac Enzymes: No results for input(s): CKTOTAL, CKMB, CKMBINDEX, TROPONINI in the last 72 hours. BNP: Invalid input(s): POCBNP D-Dimer: No results for input(s): DDIMER in the last 72 hours. Hemoglobin A1C: No results for input(s): HGBA1C in the last 72 hours. Fasting Lipid Panel: No results for input(s): CHOL, HDL, LDLCALC, TRIG, CHOLHDL, LDLDIRECT in the last 72 hours. Thyroid Function Tests: No results for input(s):  TSH, T4TOTAL, T3FREE, THYROIDAB in the last 72 hours.  Invalid input(s): FREET3 Anemia Panel: No results for input(s): VITAMINB12, FOLATE, FERRITIN, TIBC, IRON, RETICCTPCT in the last 72 hours.  RADIOLOGY: Dg Orthopantogram  05/04/2015  CLINICAL DATA:  Evaluate for left ventricular assist device placement. Initial encounter. EXAM: ORTHOPANTOGRAM/PANORAMIC COMPARISON:  None. FINDINGS: There appears to be relatively recent absence of the right first mandibular molar. Remaining dentition is unremarkable in appearance. No periapical abscesses are seen. Dental hardware is noted at the left first mandibular molar. There is no evidence of fracture or dislocation. The visualized paranasal sinuses are well-aerated. IMPRESSION: No acute abnormality seen with regard to the dentition.  Relatively recent absence of the right first mandibular molar, and dental hardware at the left first mandibular molar. Electronically Signed   By: Garald Balding M.D.   On: 05/04/2015 22:23   Dg Chest 2 View  04/30/2015  CLINICAL DATA:  Cough, allergy induced asthma, history of pacemaker, CHF, nonsmoker. EXAM: CHEST  2 VIEW COMPARISON:  Chest x-rays dated 04/23/2015 and 01/06/2015. FINDINGS: New airspace opacity is present at the right lung base. Suspect small adjacent right pleural effusion. Chronic mild atelectasis/scarring noted at the left lung base. Mild cardiomegaly is unchanged. Right chest wall pacemaker/AICD is stable in position. No acute osseous abnormality seen. IMPRESSION: 1. New airspace opacity at the right lung base. This could represent atelectasis, pneumonia or aspiration. If febrile, would certainly favor pneumonia. Suspect small adjacent right pleural effusion. 2. Cardiomegaly, stable. No evidence of significant volume overload/CHF. Electronically Signed   By: Franki Cabot M.D.   On: 04/30/2015 08:59   Ct Chest W Contrast  05/06/2015  CLINICAL DATA:  Cardiomyopathy.  Preop for cardiac transplant. EXAM: CT CHEST, ABDOMEN, AND PELVIS WITH CONTRAST TECHNIQUE: Multidetector CT imaging of the chest, abdomen and pelvis was performed following the standard protocol during bolus administration of intravenous contrast. CONTRAST:  164mL OMNIPAQUE IOHEXOL 300 MG/ML  SOLN COMPARISON:  03/02/2012 FINDINGS: CT CHEST There are small mediastinal nodes. None are greater than 10 mm in short axis diameter. Aorta is non aneurysmal and patent. No evidence of dissection or intramural hematoma. There is no obvious evidence of acute pulmonary thromboembolism. There is no evidence of enlargement of the pulmonary arterial structures to suggest pulmonary hypertension. The right ventricle and right atrium are normal in size. The left ventricle is markedly dilated with thinning of the myocardium. Left atrium is mildly  dilated. Right subclavian pacemaker device is in place. Tips of the leads are in the right atrium and right ventricle. No pericardial effusion. There is a small amount of gas in the right atrium likely related to venous injection. Moderate right pleural effusion.  Small left pleural effusion. Minimal dependent atelectasis towards the lung bases. There is some peribronchovascular soft tissue thickening in the hilar regions which may be related to volume overload or an chronic bronchitic changes. No vertebral compression deformity. CT ABDOMEN AND PELVIS Liver, spleen, pancreas, and adrenal glands are within normal limits. There is high-density material in the gallbladder compatible with either sludge or milk of calcium bile There is a small wedge-shaped area of low density in the mid right kidney worrisome for a small renal infarct. See image 32 of series 301. Unremarkable appearance of the left kidney. No hydronephrosis. There is no free intraperitoneal fluid. There is stranding in the perirectal and presacral space is within the pelvis of unknown significance. Bladder is unremarkable. Prostate is mildly enlarged. Gas in the subcutaneous fat of the left lower  quadrant likely reflects subcutaneous injection. No vertebral compression deformity. IMPRESSION: There is no convincing evidence of malignancy, pulmonary embolism or pulmonary hypertension. There is dilatation of the left ventricle consistent with a history of cardiomyopathy Bilateral pleural effusions as described. Non pathologically enlarged lymph nodes and peribronchovascular soft tissue thickening are noted. This most likely reflects volume overload or an inflammatory process. Small right renal infarct. Stranding within the perirectal fat. Differential diagnosis includes volume overload or an inflammatory process. Electronically Signed   By: Marybelle Killings M.D.   On: 05/06/2015 08:05   Ct Abdomen Pelvis W Contrast  05/06/2015  CLINICAL DATA:   Cardiomyopathy.  Preop for cardiac transplant. EXAM: CT CHEST, ABDOMEN, AND PELVIS WITH CONTRAST TECHNIQUE: Multidetector CT imaging of the chest, abdomen and pelvis was performed following the standard protocol during bolus administration of intravenous contrast. CONTRAST:  176mL OMNIPAQUE IOHEXOL 300 MG/ML  SOLN COMPARISON:  03/02/2012 FINDINGS: CT CHEST There are small mediastinal nodes. None are greater than 10 mm in short axis diameter. Aorta is non aneurysmal and patent. No evidence of dissection or intramural hematoma. There is no obvious evidence of acute pulmonary thromboembolism. There is no evidence of enlargement of the pulmonary arterial structures to suggest pulmonary hypertension. The right ventricle and right atrium are normal in size. The left ventricle is markedly dilated with thinning of the myocardium. Left atrium is mildly dilated. Right subclavian pacemaker device is in place. Tips of the leads are in the right atrium and right ventricle. No pericardial effusion. There is a small amount of gas in the right atrium likely related to venous injection. Moderate right pleural effusion.  Small left pleural effusion. Minimal dependent atelectasis towards the lung bases. There is some peribronchovascular soft tissue thickening in the hilar regions which may be related to volume overload or an chronic bronchitic changes. No vertebral compression deformity. CT ABDOMEN AND PELVIS Liver, spleen, pancreas, and adrenal glands are within normal limits. There is high-density material in the gallbladder compatible with either sludge or milk of calcium bile There is a small wedge-shaped area of low density in the mid right kidney worrisome for a small renal infarct. See image 32 of series 301. Unremarkable appearance of the left kidney. No hydronephrosis. There is no free intraperitoneal fluid. There is stranding in the perirectal and presacral space is within the pelvis of unknown significance. Bladder is  unremarkable. Prostate is mildly enlarged. Gas in the subcutaneous fat of the left lower quadrant likely reflects subcutaneous injection. No vertebral compression deformity. IMPRESSION: There is no convincing evidence of malignancy, pulmonary embolism or pulmonary hypertension. There is dilatation of the left ventricle consistent with a history of cardiomyopathy Bilateral pleural effusions as described. Non pathologically enlarged lymph nodes and peribronchovascular soft tissue thickening are noted. This most likely reflects volume overload or an inflammatory process. Small right renal infarct. Stranding within the perirectal fat. Differential diagnosis includes volume overload or an inflammatory process. Electronically Signed   By: Marybelle Killings M.D.   On: 05/06/2015 08:05    PHYSICAL EXAM CVP 12 General: NAD. In bed.  Neck: JVP 12  no thyromegaly or thyroid nodule.  Lungs: Clear to auscultation bilaterally with normal respiratory effort. CV: Lateral PMI.  Heart tachy regular S1/S2, + ectopy + S3, no murmur.  No peripheral edema.   Abdomen: Soft, nontender, no hepatosplenomegaly.  Mild distention Neurologic: Alert and oriented x 3.  Psych: Normal affect. Extremities: No clubbing or cyanosis. LUE PICC  TELEMETRY: Sinus tach with PVCs.100-110s  ASSESSMENT AND PLAN: 50 yo with history of nonischemic cardiomyopathy since 2002 was admitted with volume overload and exertional dyspnea.   1. Acute on chronic systolic CHF -> Cardiogenic shock - Nonischemic cardiomyopathy since 2002.  Echo (12/16) with EF 20-25%, moderate MR, moderately decreased RV systolic function.  Cath in 2002 at time of diagnosis with normal coronaries.  Has [employer] ICD.  Failed milrinone wean 1/27. Milrinone restarted. Norepi started 1/27 - CO-OX back down to 44% on milrinone 0.25 and norepi 2 - Increase milrinone 0.375 mcg/kg/min. Continue epi. Will draw co-ox in 1 hour. If no significant improvement will need swan and IABP    - Continue IV lasix. Supp K+ - Hold carvedilol and lisinopril. Continue digoxin.  - VAD planned for Friday. Will need to move up. CT abd/pelvis/chest completed earlier this morning. PFTs with moderate restriction & mildly reduced DLCO. Not candidate for colonoscopy currently. He is now milrinone-dependent, will need LVAD implantation and down the road transplant evaluation.  2. Elevated LFTs: Mild, noted at admission.  Suspect due to congestive hepatopathy.  3. Hypomagnesium: Mag repleted.    The patient is critically ill with multiple organ systems failure and requires high complexity decision making for assessment and support, frequent evaluation and titration of therapies, application of advanced monitoring technologies and extensive interpretation of multiple databases.   Critical Care Time devoted to patient care services described in this note is 35 Minutes  Catarina Huntley MD 05/07/2015 8:00 AM

## 2015-05-08 ENCOUNTER — Encounter (HOSPITAL_COMMUNITY)
Admission: AD | Disposition: A | Discharge status: Home Health | Payer: Self-pay | Source: Ambulatory Visit | Attending: Cardiothoracic Surgery

## 2015-05-08 DIAGNOSIS — R570 Cardiogenic shock: Secondary | ICD-10-CM | POA: Insufficient documentation

## 2015-05-08 HISTORY — PX: CARDIAC CATHETERIZATION: SHX172

## 2015-05-08 LAB — BASIC METABOLIC PANEL
Anion gap: 12 (ref 5–15)
BUN: 18 mg/dL (ref 6–20)
CO2: 31 mmol/L (ref 22–32)
Calcium: 8.5 mg/dL — ABNORMAL LOW (ref 8.9–10.3)
Chloride: 86 mmol/L — ABNORMAL LOW (ref 101–111)
Creatinine, Ser: 1.12 mg/dL (ref 0.61–1.24)
GFR calc Af Amer: >60 mL/min (ref >60)
GFR calc non Af Amer: >60 mL/min (ref >60)
Glucose, Bld: 176 mg/dL — ABNORMAL HIGH (ref 65–99)
Potassium: 3.3 mmol/L — ABNORMAL LOW (ref 3.5–5.1)
Sodium: 129 mmol/L — ABNORMAL LOW (ref 135–145)

## 2015-05-08 LAB — URINALYSIS, ROUTINE W REFLEX MICROSCOPIC
Glucose, UA: NEGATIVE mg/dL
Ketones, ur: NEGATIVE mg/dL
Leukocytes, UA: NEGATIVE
Nitrite: NEGATIVE
Protein, ur: NEGATIVE mg/dL
Specific Gravity, Urine: 1.012 (ref 1.005–1.030)
pH: 7.5 (ref 5.0–8.0)

## 2015-05-08 LAB — POCT I-STAT 3, VENOUS BLOOD GAS (G3P V)
Acid-Base Excess: 5 mmol/L — ABNORMAL HIGH (ref 0.0–2.0)
Bicarbonate: 28.8 mEq/L — ABNORMAL HIGH (ref 20.0–24.0)
O2 Saturation: 46 %
TCO2: 30 mmol/L (ref 0–100)
pCO2, Ven: 40.2 mmHg — ABNORMAL LOW (ref 45.0–50.0)
pH, Ven: 7.464 — ABNORMAL HIGH (ref 7.250–7.300)
pO2, Ven: 24 mmHg — CL (ref 30.0–45.0)

## 2015-05-08 LAB — URINE MICROSCOPIC-ADD ON
Bacteria, UA: NONE SEEN
WBC, UA: NONE SEEN WBC/hpf (ref 0–5)

## 2015-05-08 LAB — CARBOXYHEMOGLOBIN
Carboxyhemoglobin: 1.5 % (ref 0.5–1.5)
Carboxyhemoglobin: 1.2 % (ref 0.5–1.5)
Carboxyhemoglobin: 1.8 % — ABNORMAL HIGH (ref 0.5–1.5)
Carboxyhemoglobin: 2 % — ABNORMAL HIGH (ref 0.5–1.5)
Methemoglobin: 0.7 % (ref 0.0–1.5)
Methemoglobin: 0.7 % (ref 0.0–1.5)
Methemoglobin: 0.7 % (ref 0.0–1.5)
Methemoglobin: 0.6 % (ref 0.0–1.5)
O2 Saturation: 65.5 %
O2 Saturation: 40.3 %
O2 Saturation: 56.2 %
O2 Saturation: 66.3 %
Total hemoglobin: 14.2 g/dL (ref 13.5–18.0)
Total hemoglobin: 16 g/dL (ref 13.5–18.0)
Total hemoglobin: 15.3 g/dL (ref 13.5–18.0)
Total hemoglobin: 14.4 g/dL (ref 13.5–18.0)

## 2015-05-08 LAB — SURGICAL PCR SCREEN
MRSA, PCR: NEGATIVE
Staphylococcus aureus: NEGATIVE

## 2015-05-08 SURGERY — RIGHT HEART CATH
Anesthesia: LOCAL

## 2015-05-08 MED ORDER — ACETAMINOPHEN 325 MG PO TABS: 650.0000 mg | ORAL_TABLET | ORAL | Status: DC | PRN

## 2015-05-08 MED ORDER — LIDOCAINE HCL (PF) 1 % IJ SOLN
INTRAMUSCULAR | Status: AC
  Filled 2015-05-08: qty 30

## 2015-05-08 MED ORDER — HEPARIN (PORCINE) IN NACL 2-0.9 UNIT/ML-% IJ SOLN
INTRAMUSCULAR | Status: DC | PRN
  Administered 2015-05-08: 16:00:00

## 2015-05-08 MED ORDER — HEPARIN (PORCINE) IN NACL 100-0.45 UNIT/ML-% IJ SOLN
1200.0000 [IU]/h | INTRAMUSCULAR | Status: DC
  Administered 2015-05-08: 1100 [IU]/h via INTRAVENOUS
  Filled 2015-05-08 (×3): qty 250

## 2015-05-08 MED ORDER — POTASSIUM CHLORIDE CRYS ER 20 MEQ PO TBCR
40.0000 meq | EXTENDED_RELEASE_TABLET | Freq: Once | ORAL | Status: AC
  Administered 2015-05-08: 40 meq via ORAL
  Filled 2015-05-08: qty 2

## 2015-05-08 MED ORDER — FENTANYL CITRATE (PF) 100 MCG/2ML IJ SOLN
INTRAMUSCULAR | Status: AC
  Filled 2015-05-08: qty 2

## 2015-05-08 MED ORDER — ONDANSETRON HCL 4 MG/2ML IJ SOLN: 4.0000 mg | Freq: Four times a day (QID) | INTRAMUSCULAR | Status: DC | PRN

## 2015-05-08 MED ORDER — SODIUM CHLORIDE 0.9 % IV SOLN: 250.0000 mL | INTRAVENOUS | Status: DC | PRN

## 2015-05-08 MED ORDER — IOHEXOL 350 MG/ML SOLN
INTRAVENOUS | Status: DC | PRN
  Administered 2015-05-08: 5 mL via INTRAVENOUS

## 2015-05-08 MED ORDER — HEPARIN SODIUM (PORCINE) 1000 UNIT/ML IJ SOLN
INTRAMUSCULAR | Status: DC | PRN
  Administered 2015-05-08: 4000 [IU] via INTRAVENOUS

## 2015-05-08 MED ORDER — LIDOCAINE HCL (PF) 1 % IJ SOLN
INTRAMUSCULAR | Status: DC | PRN
  Administered 2015-05-08: 30 mL via INTRADERMAL

## 2015-05-08 MED ORDER — SODIUM CHLORIDE 0.9% FLUSH: 3.0000 mL | INTRAVENOUS | Status: DC | PRN

## 2015-05-08 MED ORDER — FUROSEMIDE 10 MG/ML IJ SOLN: 60.0000 mg | Freq: Once | INTRAMUSCULAR | Status: DC

## 2015-05-08 MED ORDER — FUROSEMIDE 10 MG/ML IJ SOLN: 120.0000 mg | Freq: Once | INTRAMUSCULAR | Status: DC

## 2015-05-08 MED ORDER — SODIUM CHLORIDE 0.9 % IV SOLN
INTRAVENOUS | Status: DC | PRN
  Administered 2015-05-08: 5 mL/h via INTRAVENOUS

## 2015-05-08 MED ORDER — HEPARIN (PORCINE) IN NACL 2-0.9 UNIT/ML-% IJ SOLN
INTRAMUSCULAR | Status: AC
  Filled 2015-05-08: qty 1000

## 2015-05-08 MED ORDER — HEPARIN SODIUM (PORCINE) 1000 UNIT/ML IJ SOLN
INTRAMUSCULAR | Status: AC
  Filled 2015-05-08: qty 1

## 2015-05-08 MED ORDER — SODIUM CHLORIDE 0.9% FLUSH
3.0000 mL | Freq: Two times a day (BID) | INTRAVENOUS | Status: DC
  Administered 2015-05-08 – 2015-05-09 (×2): 3 mL via INTRAVENOUS

## 2015-05-08 MED ORDER — FENTANYL CITRATE (PF) 100 MCG/2ML IJ SOLN
INTRAMUSCULAR | Status: DC | PRN
  Administered 2015-05-08 (×2): 25 ug via INTRAVENOUS

## 2015-05-08 MED ORDER — MILRINONE IN DEXTROSE 20 MG/100ML IV SOLN
0.5000 ug/kg/min | INTRAVENOUS | Status: DC
  Administered 2015-05-08: 0.375 ug/kg/min via INTRAVENOUS
  Administered 2015-05-09 (×2): 0.5 ug/kg/min via INTRAVENOUS
  Administered 2015-05-09: 0.375 ug/kg/min via INTRAVENOUS
  Filled 2015-05-08 (×4): qty 100

## 2015-05-08 SURGICAL SUPPLY — 13 items
BALLN LINEAR 7.5FR IABP 40CC (BALLOONS) ×2
BALLOON LINEAR 7.5FR IABP 40CC (BALLOONS) IMPLANT
CATH SWAN GANZ VIP 7.5F (CATHETERS) ×1 IMPLANT
DEVICE SECURE STATLOCK IABP (MISCELLANEOUS) ×2 IMPLANT
HOVERMATT SINGLE USE (MISCELLANEOUS) ×1 IMPLANT
KIT HEART LEFT (KITS) ×1 IMPLANT
PACK CARDIAC CATHETERIZATION (CUSTOM PROCEDURE TRAY) ×2 IMPLANT
SHEATH PINNACLE 5F 10CM (SHEATH) ×1 IMPLANT
SHEATH PINNACLE 8F 10CM (SHEATH) ×1 IMPLANT
SLEEVE REPOSITIONING LENGTH 30 (MISCELLANEOUS) ×1 IMPLANT
TRANSDUCER W/STOPCOCK (MISCELLANEOUS) ×3 IMPLANT
WIRE EMERALD 3MM-J .025X260CM (WIRE) ×1 IMPLANT
WIRE EMERALD 3MM-J .035X150CM (WIRE) ×1 IMPLANT

## 2015-05-08 NOTE — Progress Notes (Signed)
Cath Note  Done on milrinone 0.375 and norepi 7  Findings:  RA = 15 RV = 60/11/13 PA = 58/38 (47) PCW = 32 Fick cardiac output/index = 2.45/1.28 PVR = 6.2 WU  FA sat = 93% PA sat = 46% RA/PCWP = 0.47  Conclusion:  Successful IABP placement for profound cardiogenic shock. Continue to diurese. Follow hemodynamics and PVR. Plan VAD Tuesday.   Bensimhon, Daniel,MD 4:09 PM

## 2015-05-08 NOTE — Progress Notes (Signed)
Patient ID: Patrick Humphrey, male   DOB: [birth year], 50 y.o.   MRN: [id number]   SUBJECTIVE:   Admitted with marked volume overload. Diuresed with IV lasix and started on 0.25 mcg milrinone.   Failed milrinone wean on 1/26 with  CO-OX down to 37% and CVP up 13. Restarted on milrinone 0.25 mcg and IV lasix was restarted.   Co-ox back down yesterday. Milrinone increased to 0.375. Remains on norepi 2. Feels better. No SOB. Good appetite. CVP 12 Co-ox 65%  RHC/LHC No coronary disease RA mean 9  RV 47/11 PA 50/29 mean 36 PCWP mean 21 LV 80/21 AO 80/60 Oxygen saturations: PA 60% AO 97% Cardiac Output (Fick) 3.7  Cardiac Index (Fick) 1.9 PVR 4 WU  Scheduled Meds: . aspirin EC  81 mg Oral Daily  . digoxin  0.125 mg Oral Daily  . enoxaparin (LOVENOX) injection  40 mg Subcutaneous Q24H  . feeding supplement  1 Container Oral Q24H  . feeding supplement (ENSURE ENLIVE)  237 mL Oral Q24H  . furosemide  80 mg Intravenous BID  . loratadine  10 mg Oral Daily  . magnesium oxide  400 mg Oral Daily  . montelukast  10 mg Oral QHS  . pantoprazole  40 mg Oral Daily  . sodium chloride  10-40 mL Intracatheter Q12H  . sodium chloride  3 mL Intravenous Q12H  . spironolactone  12.5 mg Oral Daily   Continuous Infusions: . milrinone 0.375 mcg/kg/min (05/08/15 0829)  . norepinephrine (LEVOPHED) Adult infusion 2 mcg/min (05/08/15 0800)   PRN Meds:.sodium chloride, acetaminophen, ALPRAZolam, guaiFENesin-dextromethorphan, ondansetron (ZOFRAN) IV, sodium chloride, sodium chloride, zolpidem    Filed Vitals:   05/08/15 0400 05/08/15 0500 05/08/15 0820 05/08/15 0940  BP:   104/83   Pulse:   117 121  Temp: 98.3 F (36.8 C)  98.5 F (36.9 C)   TempSrc: Oral  Oral   Resp:   18   Height:      Weight:  73.029 kg (161 lb)    SpO2:   92%     Intake/Output Summary (Last 24 hours) at 05/08/15 1006 Last data filed at 05/08/15 0856  Gross per 24 hour  Intake  901.4 ml  Output   1125 ml  Net  -223.6 ml    LABS: Basic Metabolic Panel:  Recent Labs  05/07/15 0605 05/08/15 0539  NA 132* 129*  K 3.5 3.3*  CL 90* 86*  CO2 31 31  GLUCOSE 113* 176*  BUN 18 18  CREATININE 1.26* 1.12  CALCIUM 8.8* 8.5*   Liver Function Tests: No results for input(s): AST, ALT, ALKPHOS, BILITOT, PROT, ALBUMIN in the last 72 hours. No results for input(s): LIPASE, AMYLASE in the last 72 hours. CBC:  Recent Labs  05/06/15 2235  HGB 16.3  HCT 48.0   Cardiac Enzymes: No results for input(s): CKTOTAL, CKMB, CKMBINDEX, TROPONINI in the last 72 hours. BNP: Invalid input(s): POCBNP D-Dimer: No results for input(s): DDIMER in the last 72 hours. Hemoglobin A1C: No results for input(s): HGBA1C in the last 72 hours. Fasting Lipid Panel: No results for input(s): CHOL, HDL, LDLCALC, TRIG, CHOLHDL, LDLDIRECT in the last 72 hours. Thyroid Function Tests: No results for input(s): TSH, T4TOTAL, T3FREE, THYROIDAB in the last 72 hours.  Invalid input(s): FREET3 Anemia Panel: No results for input(s): VITAMINB12, FOLATE, FERRITIN, TIBC, IRON, RETICCTPCT in the last 72 hours.  RADIOLOGY: Dg Orthopantogram  05/04/2015  CLINICAL DATA:  Evaluate for left ventricular assist device placement. Initial encounter. EXAM: ORTHOPANTOGRAM/PANORAMIC  COMPARISON:  None. FINDINGS: There appears to be relatively recent absence of the right first mandibular molar. Remaining dentition is unremarkable in appearance. No periapical abscesses are seen. Dental hardware is noted at the left first mandibular molar. There is no evidence of fracture or dislocation. The visualized paranasal sinuses are well-aerated. IMPRESSION: No acute abnormality seen with regard to the dentition. Relatively recent absence of the right first mandibular molar, and dental hardware at the left first mandibular molar. Electronically Signed   By: Garald Balding M.D.   On: 05/04/2015 22:23   Dg Chest 2 View  04/30/2015  CLINICAL DATA:  Cough, allergy  induced asthma, history of pacemaker, CHF, nonsmoker. EXAM: CHEST  2 VIEW COMPARISON:  Chest x-rays dated 04/23/2015 and 01/06/2015. FINDINGS: New airspace opacity is present at the right lung base. Suspect small adjacent right pleural effusion. Chronic mild atelectasis/scarring noted at the left lung base. Mild cardiomegaly is unchanged. Right chest wall pacemaker/AICD is stable in position. No acute osseous abnormality seen. IMPRESSION: 1. New airspace opacity at the right lung base. This could represent atelectasis, pneumonia or aspiration. If febrile, would certainly favor pneumonia. Suspect small adjacent right pleural effusion. 2. Cardiomegaly, stable. No evidence of significant volume overload/CHF. Electronically Signed   By: Franki Cabot M.D.   On: 04/30/2015 08:59   Ct Chest W Contrast  05/06/2015  CLINICAL DATA:  Cardiomyopathy.  Preop for cardiac transplant. EXAM: CT CHEST, ABDOMEN, AND PELVIS WITH CONTRAST TECHNIQUE: Multidetector CT imaging of the chest, abdomen and pelvis was performed following the standard protocol during bolus administration of intravenous contrast. CONTRAST:  145mL OMNIPAQUE IOHEXOL 300 MG/ML  SOLN COMPARISON:  03/02/2012 FINDINGS: CT CHEST There are small mediastinal nodes. None are greater than 10 mm in short axis diameter. Aorta is non aneurysmal and patent. No evidence of dissection or intramural hematoma. There is no obvious evidence of acute pulmonary thromboembolism. There is no evidence of enlargement of the pulmonary arterial structures to suggest pulmonary hypertension. The right ventricle and right atrium are normal in size. The left ventricle is markedly dilated with thinning of the myocardium. Left atrium is mildly dilated. Right subclavian pacemaker device is in place. Tips of the leads are in the right atrium and right ventricle. No pericardial effusion. There is a small amount of gas in the right atrium likely related to venous injection. Moderate right pleural  effusion.  Small left pleural effusion. Minimal dependent atelectasis towards the lung bases. There is some peribronchovascular soft tissue thickening in the hilar regions which may be related to volume overload or an chronic bronchitic changes. No vertebral compression deformity. CT ABDOMEN AND PELVIS Liver, spleen, pancreas, and adrenal glands are within normal limits. There is high-density material in the gallbladder compatible with either sludge or milk of calcium bile There is a small wedge-shaped area of low density in the mid right kidney worrisome for a small renal infarct. See image 32 of series 301. Unremarkable appearance of the left kidney. No hydronephrosis. There is no free intraperitoneal fluid. There is stranding in the perirectal and presacral space is within the pelvis of unknown significance. Bladder is unremarkable. Prostate is mildly enlarged. Gas in the subcutaneous fat of the left lower quadrant likely reflects subcutaneous injection. No vertebral compression deformity. IMPRESSION: There is no convincing evidence of malignancy, pulmonary embolism or pulmonary hypertension. There is dilatation of the left ventricle consistent with a history of cardiomyopathy Bilateral pleural effusions as described. Non pathologically enlarged lymph nodes and peribronchovascular soft tissue thickening are  noted. This most likely reflects volume overload or an inflammatory process. Small right renal infarct. Stranding within the perirectal fat. Differential diagnosis includes volume overload or an inflammatory process. Electronically Signed   By: Marybelle Killings M.D.   On: 05/06/2015 08:05   Ct Abdomen Pelvis W Contrast  05/06/2015  CLINICAL DATA:  Cardiomyopathy.  Preop for cardiac transplant. EXAM: CT CHEST, ABDOMEN, AND PELVIS WITH CONTRAST TECHNIQUE: Multidetector CT imaging of the chest, abdomen and pelvis was performed following the standard protocol during bolus administration of intravenous contrast.  CONTRAST:  135mL OMNIPAQUE IOHEXOL 300 MG/ML  SOLN COMPARISON:  03/02/2012 FINDINGS: CT CHEST There are small mediastinal nodes. None are greater than 10 mm in short axis diameter. Aorta is non aneurysmal and patent. No evidence of dissection or intramural hematoma. There is no obvious evidence of acute pulmonary thromboembolism. There is no evidence of enlargement of the pulmonary arterial structures to suggest pulmonary hypertension. The right ventricle and right atrium are normal in size. The left ventricle is markedly dilated with thinning of the myocardium. Left atrium is mildly dilated. Right subclavian pacemaker device is in place. Tips of the leads are in the right atrium and right ventricle. No pericardial effusion. There is a small amount of gas in the right atrium likely related to venous injection. Moderate right pleural effusion.  Small left pleural effusion. Minimal dependent atelectasis towards the lung bases. There is some peribronchovascular soft tissue thickening in the hilar regions which may be related to volume overload or an chronic bronchitic changes. No vertebral compression deformity. CT ABDOMEN AND PELVIS Liver, spleen, pancreas, and adrenal glands are within normal limits. There is high-density material in the gallbladder compatible with either sludge or milk of calcium bile There is a small wedge-shaped area of low density in the mid right kidney worrisome for a small renal infarct. See image 32 of series 301. Unremarkable appearance of the left kidney. No hydronephrosis. There is no free intraperitoneal fluid. There is stranding in the perirectal and presacral space is within the pelvis of unknown significance. Bladder is unremarkable. Prostate is mildly enlarged. Gas in the subcutaneous fat of the left lower quadrant likely reflects subcutaneous injection. No vertebral compression deformity. IMPRESSION: There is no convincing evidence of malignancy, pulmonary embolism or pulmonary  hypertension. There is dilatation of the left ventricle consistent with a history of cardiomyopathy Bilateral pleural effusions as described. Non pathologically enlarged lymph nodes and peribronchovascular soft tissue thickening are noted. This most likely reflects volume overload or an inflammatory process. Small right renal infarct. Stranding within the perirectal fat. Differential diagnosis includes volume overload or an inflammatory process. Electronically Signed   By: Marybelle Killings M.D.   On: 05/06/2015 08:05    PHYSICAL EXAM CVP 12 General: NAD. In bed.  Neck: JVP 12  no thyromegaly or thyroid nodule.  Lungs: Clear to auscultation bilaterally with normal respiratory effort. CV: Lateral PMI.  Heart tachy regular S1/S2, + ectopy + S3, no murmur.  No peripheral edema.   Abdomen: Soft, nontender, no hepatosplenomegaly.  Mild distention Neurologic: Alert and oriented x 3.  Psych: Normal affect. Extremities: No clubbing or cyanosis. LUE PICC  TELEMETRY: Sinus tach with PVCs.100-110s    ASSESSMENT AND PLAN: 50 yo with history of nonischemic cardiomyopathy since 2002 was admitted with volume overload and exertional dyspnea.   1. Acute on chronic systolic CHF -> Cardiogenic shock - Nonischemic cardiomyopathy since 2002.  Echo (12/16) with EF 20-25%, moderate MR, moderately decreased RV systolic function.  Cath  in 2002 at time of diagnosis with normal coronaries.  Has [employer] ICD.  Failed milrinone wean 1/27. Milrinone restarted. Norepi started 1/27 - CO-OX now up on milrinone 0.375 and norepi 2 - Continue IV lasix. Supp K+ - Hold carvedilol and lisinopril. Continue digoxin.  - Discussed with VAD team. VAD tentatively moved up to Tuesday pending insurance approval tomorrow.  CT abd/pelvis/chest completed earlier this morning. PFTs with moderate restriction & mildly reduced DLCO. Not candidate for colonoscopy currently.  2. Elevated LFTs: Mild, noted at admission.  Suspect due to congestive  hepatopathy.  3. Hypomagnesium: Mag repleted.   4. Hypokalemia: will supp   Glori Bickers MD 05/08/2015 10:06 AM

## 2015-05-08 NOTE — Progress Notes (Signed)
Orthopedic Tech Progress Note Patient Details:  Patrick Humphrey 09/21/1965 [id number] Delivered Velcro knee immobilizer to pt.'s nurse. Patient ID: Patrick Humphrey, male   DOB: 03-13-1966, 50 y.o.   MRN: [id number]   Darrol Poke 05/08/2015, 9:33 PM

## 2015-05-08 NOTE — Progress Notes (Signed)
New CVP and PAP pressure monitoring set-ups completed post surgery.

## 2015-05-08 NOTE — Progress Notes (Signed)
Pt did not looked like he did not feel well as well as HR in the130s. Stat COOX drawn. Results 40%. Dr Haroldine Laws notified and RN increased Levo to 7 mcg and administered 120 IV lasix. Pt prepared for cath lab with [facility] and balloon pump insertion.

## 2015-05-08 NOTE — Progress Notes (Signed)
Paged Dr Haroldine Laws about pt inc HR in the 130-140s. MD wants to decrease levo to 1 mcg and monitor.

## 2015-05-08 NOTE — Progress Notes (Signed)
ANTICOAGULATION CONSULT NOTE - Initial Consult  Pharmacy Consult for heparin Indication: IABP  Allergies  Allergen Reactions  . Penicillins     Has patient had a PCN reaction causing immediate rash, facial/tongue/throat swelling, SOB or lightheadedness with hypotension: Yes Has patient had a PCN reaction causing severe rash involving mucus membranes or skin necrosis: No Has patient had a PCN reaction that required hospitalization No Has patient had a PCN reaction occurring within the last 10 years: No If all of the above answers are "NO", then may proceed with Cephalosporin use.  Unknown-childhood  . Sulfa Antibiotics     Patient Measurements: Height: 5\' 11"  (180.3 cm) Weight: 161 lb (73.029 kg) IBW/kg (Calculated) : 75.3 Heparin Dosing Weight: 73kg  Vital Signs: Temp: 97.8 F (36.6 C) (01/29 1630) Temp Source: Oral (01/29 1630) BP: 110/75 mmHg (01/29 1553) Pulse Rate: 0 (01/29 1558)  Labs:  Recent Labs  05/06/15 2235  05/07/15 0500 05/07/15 0605 05/08/15 0539  HGB 16.3  --   --   --   --   HCT 48.0  --   --   --   --   CREATININE 1.10  < > 0.80 1.26* 1.12  < > = values in this interval not displayed.  Estimated Creatinine Clearance: 81.5 mL/min (by C-G formula based on Cr of 1.12).   Medical History: Past Medical History  Diagnosis Date  . Nonischemic cardiomyopathy ([place])   . ICD (implantable cardioverter-defibrillator), single, in situ 08/26/2007  . Hx of echocardiogram 11/09/1910    Showed an EF of 25% with no significant valve disease.  Marland Kitchen AICD (automatic cardioverter/defibrillator) present   . Presence of permanent cardiac pacemaker   . CHF (congestive heart failure) ([facility])     Medications:  Scheduled:  . aspirin EC  81 mg Oral Daily  . digoxin  0.125 mg Oral Daily  . feeding supplement  1 Container Oral Q24H  . feeding supplement (ENSURE ENLIVE)  237 mL Oral Q24H  . furosemide  60 mg Intravenous Once  . furosemide  60 mg Intravenous Once  .  furosemide  80 mg Intravenous BID  . loratadine  10 mg Oral Daily  . magnesium oxide  400 mg Oral Daily  . montelukast  10 mg Oral QHS  . pantoprazole  40 mg Oral Daily  . potassium chloride  40 mEq Oral Once  . sodium chloride  10-40 mL Intracatheter Q12H  . sodium chloride  3 mL Intravenous Q12H  . sodium chloride flush  3 mL Intravenous Q12H  . sodium chloride flush  3 mL Intravenous Q12H  . spironolactone  12.5 mg Oral Daily   Infusions:  . milrinone 0.375 mcg/kg/min (05/08/15 1339)  . norepinephrine (LEVOPHED) Adult infusion 3 mcg/min (05/08/15 1552)    Assessment: 50 yo who was admitted for a long hx CHF. He got a IABP today while waiting for a possible LVAD. Heparin has been ordered for anticoagulation.   Goal of Therapy:  Heparin level 0.2-0.5 units/ml Monitor platelets by anticoagulation protocol: Yes   Plan:   Heparin infusion at 1100 units/hr F/u with 6hr heparin level Daily level and CBC  Onnie Boer, PharmD Pager: [phone] 05/08/2015 4:49 PM

## 2015-05-08 NOTE — Interval H&P Note (Signed)
History and Physical Interval Note:  05/08/2015 4:02 PM  Patrick Humphrey  has presented today for surgery, with the diagnosis of heart failure and cardiogenic shock  The various methods of treatment have been discussed with the patient and family. After consideration of risks, benefits and other options for treatment, the patient has consented to  Procedure(s): Right Heart Cath (N/A) IABP Insertion (N/A) as a surgical intervention .  The patient's history has been reviewed, patient examined, no change in status, stable for surgery.  I have reviewed the patient's chart and labs.  Questions were answered to the patient's satisfaction.     Patrick Humphrey, Quillian Quince

## 2015-05-08 NOTE — Progress Notes (Addendum)
  Patient feeling worse this afternoon. Weak. Nauseated. More tachycardic. Frequent PVCs.   Co-ox repeated and was 40%. CVP 12-13. Minimal response to IV lasix.   Levophed increased. Will take to cath lab emergently for [facility] and IABP placement.   Procedure explained to him and his family. Given zofran for nausea without much benefit.    D/w Dr. Prescott Gum at the bedside.   The patient is critically ill with multiple organ systems failure and requires high complexity decision making for assessment and support, frequent evaluation and titration of therapies, application of advanced monitoring technologies and extensive interpretation of multiple databases.   Critical Care Time devoted to patient care services described in this note is 35 Minutes.  Bensimhon, Daniel,MD 2:12 PM

## 2015-05-08 NOTE — H&P (View-Only) (Signed)
  Patient feeling worse this afternoon. Weak. Nauseated. More tachycardic.   Co-ox repeated and was 40%. CVP 12-13.  Will increase levophed and take to cath lab emergently for [facility] and IABP placement.   Procedure explained.   The patient is critically ill with multiple organ systems failure and requires high complexity decision making for assessment and support, frequent evaluation and titration of therapies, application of advanced monitoring technologies and extensive interpretation of multiple databases.   Critical Care Time devoted to patient care services described in this note is 35 Minutes.  Bensimhon, Daniel,MD 2:12 PM

## 2015-05-09 ENCOUNTER — Encounter (HOSPITAL_COMMUNITY): Payer: Self-pay | Admitting: Internal Medicine

## 2015-05-09 ENCOUNTER — Inpatient Hospital Stay (HOSPITAL_COMMUNITY): Payer: Medicare Other | Admitting: Anesthesiology

## 2015-05-09 ENCOUNTER — Inpatient Hospital Stay (HOSPITAL_COMMUNITY): Payer: Medicare Other

## 2015-05-09 ENCOUNTER — Encounter (HOSPITAL_COMMUNITY)
Admission: AD | Disposition: A | Discharge status: Home Health | Payer: Self-pay | Source: Ambulatory Visit | Attending: Cardiothoracic Surgery

## 2015-05-09 DIAGNOSIS — I5023 Acute on chronic systolic (congestive) heart failure: Secondary | ICD-10-CM

## 2015-05-09 DIAGNOSIS — Z515 Encounter for palliative care: Secondary | ICD-10-CM | POA: Insufficient documentation

## 2015-05-09 DIAGNOSIS — R570 Cardiogenic shock: Secondary | ICD-10-CM

## 2015-05-09 LAB — CARBOXYHEMOGLOBIN
Carboxyhemoglobin: 1.7 % — ABNORMAL HIGH (ref 0.5–1.5)
Carboxyhemoglobin: 2.3 % — ABNORMAL HIGH (ref 0.5–1.5)
Methemoglobin: 0.8 % (ref 0.0–1.5)
Methemoglobin: 0.8 % (ref 0.0–1.5)
O2 Saturation: 53.2 %
O2 Saturation: 52.4 %
Total hemoglobin: 14.6 g/dL (ref 13.5–18.0)
Total hemoglobin: 13.6 g/dL (ref 13.5–18.0)

## 2015-05-09 LAB — COMPREHENSIVE METABOLIC PANEL
ALT: 38 U/L (ref 17–63)
AST: 68 U/L — ABNORMAL HIGH (ref 15–41)
Albumin: 3 g/dL — ABNORMAL LOW (ref 3.5–5.0)
Alkaline Phosphatase: 81 U/L (ref 38–126)
Anion gap: 14 (ref 5–15)
BUN: 22 mg/dL — ABNORMAL HIGH (ref 6–20)
CO2: 28 mmol/L (ref 22–32)
Calcium: 8.9 mg/dL (ref 8.9–10.3)
Chloride: 89 mmol/L — ABNORMAL LOW (ref 101–111)
Creatinine, Ser: 1.28 mg/dL — ABNORMAL HIGH (ref 0.61–1.24)
GFR calc Af Amer: >60 mL/min (ref >60)
GFR calc non Af Amer: >60 mL/min (ref >60)
Glucose, Bld: 137 mg/dL — ABNORMAL HIGH (ref 65–99)
Potassium: 4.2 mmol/L (ref 3.5–5.1)
Sodium: 131 mmol/L — ABNORMAL LOW (ref 135–145)
Total Bilirubin: 3.5 mg/dL — ABNORMAL HIGH (ref 0.3–1.2)
Total Protein: 6.5 g/dL (ref 6.5–8.1)

## 2015-05-09 LAB — CBC
HCT: 41.6 % (ref 39.0–52.0)
Hemoglobin: 14.5 g/dL (ref 13.0–17.0)
MCH: 33 pg (ref 26.0–34.0)
MCHC: 34.9 g/dL (ref 30.0–36.0)
MCV: 94.5 fL (ref 78.0–100.0)
Platelets: 180 10*3/uL (ref 150–400)
RBC: 4.4 MIL/uL (ref 4.22–5.81)
RDW: 14.9 % (ref 11.5–15.5)
WBC: 7.4 10*3/uL (ref 4.0–10.5)

## 2015-05-09 LAB — FACTOR 5 LEIDEN

## 2015-05-09 LAB — HEPARIN LEVEL (UNFRACTIONATED)
Heparin Unfractionated: 0.17 IU/mL — ABNORMAL LOW (ref 0.30–0.70)
Heparin Unfractionated: 0.31 IU/mL (ref 0.30–0.70)

## 2015-05-09 SURGERY — COLONOSCOPY
Anesthesia: Monitor Anesthesia Care

## 2015-05-09 MED ORDER — TEMAZEPAM 15 MG PO CAPS
15.0000 mg | ORAL_CAPSULE | Freq: Once | ORAL | Status: AC | PRN
  Administered 2015-05-09: 15 mg via ORAL
  Filled 2015-05-09: qty 1

## 2015-05-09 MED ORDER — DEXMEDETOMIDINE HCL IN NACL 400 MCG/100ML IV SOLN
0.1000 ug/kg/h | INTRAVENOUS | Status: DC
  Administered 2015-05-10: .3 ug/kg/h via INTRAVENOUS
  Filled 2015-05-09: qty 100

## 2015-05-09 MED ORDER — VANCOMYCIN HCL 10 G IV SOLR
1250.0000 mg | INTRAVENOUS | Status: DC
  Administered 2015-05-10: 1250 mg via INTRAVENOUS
  Filled 2015-05-09 (×2): qty 1250

## 2015-05-09 MED ORDER — DOPAMINE-DEXTROSE 3.2-5 MG/ML-% IV SOLN
0.0000 ug/kg/min | INTRAVENOUS | Status: DC
  Filled 2015-05-09: qty 250

## 2015-05-09 MED ORDER — DIAZEPAM 5 MG PO TABS
5.0000 mg | ORAL_TABLET | Freq: Once | ORAL | Status: AC
  Administered 2015-05-10: 5 mg via ORAL
  Filled 2015-05-09: qty 1

## 2015-05-09 MED ORDER — FENTANYL CITRATE (PF) 100 MCG/2ML IJ SOLN
25.0000 ug | INTRAMUSCULAR | Status: DC | PRN
  Filled 2015-05-09: qty 2

## 2015-05-09 MED ORDER — CHLORHEXIDINE GLUCONATE CLOTH 2 % EX PADS
6.0000 | MEDICATED_PAD | Freq: Once | CUTANEOUS | Status: AC
  Administered 2015-05-10: 6 via TOPICAL

## 2015-05-09 MED ORDER — CHLORHEXIDINE GLUCONATE 0.12 % MT SOLN
15.0000 mL | Freq: Once | OROMUCOSAL | Status: AC
  Administered 2015-05-10: 15 mL via OROMUCOSAL

## 2015-05-09 MED ORDER — POTASSIUM CHLORIDE 2 MEQ/ML IV SOLN
80.0000 meq | INTRAVENOUS | Status: DC
  Filled 2015-05-09: qty 40

## 2015-05-09 MED ORDER — NOREPINEPHRINE BITARTRATE 1 MG/ML IV SOLN
0.0000 ug/min | INTRAVENOUS | Status: DC
  Administered 2015-05-10: 6 ug/min via INTRAVENOUS
  Filled 2015-05-09: qty 4

## 2015-05-09 MED ORDER — MILRINONE IN DEXTROSE 20 MG/100ML IV SOLN
0.3000 ug/kg/min | INTRAVENOUS | Status: DC
  Filled 2015-05-09: qty 100

## 2015-05-09 MED ORDER — FLUCONAZOLE IN SODIUM CHLORIDE 400-0.9 MG/200ML-% IV SOLN
400.0000 mg | INTRAVENOUS | Status: DC
  Administered 2015-05-10: 400 mg via INTRAVENOUS
  Filled 2015-05-09: qty 200

## 2015-05-09 MED ORDER — RIFAMPIN 600 MG IV SOLR
600.0000 mg | INTRAVENOUS | Status: DC
Start: 2015-05-10 — End: 2015-05-10
  Administered 2015-05-10: 600 mg via INTRAVENOUS
  Filled 2015-05-09: qty 600

## 2015-05-09 MED ORDER — DEXTROSE 5 % IV SOLN
0.0000 ug/min | INTRAVENOUS | Status: DC
  Filled 2015-05-09: qty 2

## 2015-05-09 MED ORDER — SODIUM CHLORIDE 0.9 % IV SOLN
600.0000 mg | INTRAVENOUS | Status: DC
  Filled 2015-05-09: qty 600

## 2015-05-09 MED ORDER — SODIUM CHLORIDE 0.9 % IV SOLN
INTRAVENOUS | Status: DC
  Administered 2015-05-10: 69.8 mL/h via INTRAVENOUS
  Filled 2015-05-09: qty 40

## 2015-05-09 MED ORDER — NITROGLYCERIN IN D5W 200-5 MCG/ML-% IV SOLN
0.0000 ug/min | INTRAVENOUS | Status: DC
  Filled 2015-05-09: qty 250

## 2015-05-09 MED ORDER — CALCIUM CARBONATE ANTACID 500 MG PO CHEW
1.0000 | CHEWABLE_TABLET | ORAL | Status: DC | PRN
Start: 2015-05-09 — End: 2015-05-10
  Administered 2015-05-09: 200 mg via ORAL
  Filled 2015-05-09: qty 1

## 2015-05-09 MED ORDER — MAGNESIUM SULFATE 50 % IJ SOLN
40.0000 meq | INTRAMUSCULAR | Status: DC
  Filled 2015-05-09: qty 10

## 2015-05-09 MED ORDER — HEPARIN SODIUM (PORCINE) 1000 UNIT/ML IJ SOLN
INTRAMUSCULAR | Status: DC
  Filled 2015-05-09: qty 30

## 2015-05-09 MED ORDER — ALPRAZOLAM 0.25 MG PO TABS
0.2500 mg | ORAL_TABLET | ORAL | Status: DC | PRN
  Administered 2015-05-09 (×2): 0.25 mg via ORAL
  Filled 2015-05-09 (×2): qty 1

## 2015-05-09 MED ORDER — EPINEPHRINE HCL 1 MG/ML IJ SOLN
0.0000 ug/min | INTRAVENOUS | Status: DC
  Administered 2015-05-10: 2 ug/min via INTRAVENOUS
  Filled 2015-05-09: qty 4

## 2015-05-09 MED ORDER — LEVOFLOXACIN IN D5W 500 MG/100ML IV SOLN
500.0000 mg | INTRAVENOUS | Status: DC
  Administered 2015-05-10: 500 mg via INTRAVENOUS
  Filled 2015-05-09: qty 100

## 2015-05-09 MED ORDER — INSULIN REGULAR HUMAN 100 UNIT/ML IJ SOLN
INTRAMUSCULAR | Status: DC
  Administered 2015-05-10: 1.2 [IU]/h via INTRAVENOUS
  Filled 2015-05-09: qty 2.5

## 2015-05-09 MED ORDER — DOBUTAMINE IN D5W 4-5 MG/ML-% IV SOLN
2.0000 ug/kg/min | INTRAVENOUS | Status: DC
  Filled 2015-05-09: qty 250

## 2015-05-09 MED ORDER — VANCOMYCIN HCL 1000 MG IV SOLR
1000.0000 mg | INTRAVENOUS | Status: DC
  Filled 2015-05-09: qty 1000

## 2015-05-09 MED ORDER — VASOPRESSIN 20 UNIT/ML IV SOLN
0.0400 [IU]/min | INTRAVENOUS | Status: DC
  Filled 2015-05-09: qty 2

## 2015-05-09 MED ORDER — CHLORHEXIDINE GLUCONATE CLOTH 2 % EX PADS
6.0000 | MEDICATED_PAD | Freq: Once | CUTANEOUS | Status: AC
  Administered 2015-05-09: 6 via TOPICAL

## 2015-05-09 MED ORDER — BISACODYL 5 MG PO TBEC
5.0000 mg | DELAYED_RELEASE_TABLET | Freq: Once | ORAL | Status: AC
  Administered 2015-05-09: 5 mg via ORAL
  Filled 2015-05-09: qty 1

## 2015-05-09 MED ORDER — MUPIROCIN 2 % EX OINT
1.0000 "application " | TOPICAL_OINTMENT | Freq: Two times a day (BID) | CUTANEOUS | Status: AC
  Administered 2015-05-09 (×2): 1 via NASAL
  Filled 2015-05-09: qty 22

## 2015-05-09 NOTE — Progress Notes (Signed)
1 Day Post-Op Procedure(s) (LRB): Right Heart Cath (N/A) IABP Insertion (N/A) Subjective: Patient feels better after placement of IABP,[place] numbers now improved  CXR this am clear Set for placement of Heartmate 2 VAD in am for Destination Therapy Indication. The patient understands he is not currently a candidate for cardiac transplantation   because he is not on a transplant list but that he could potentially become a candidate for transplantation in the future He understands the indication for VAD implant and the potential risks to include bleeding, blood transfusion, RV failure requiring RVAD and death   Objective: Vital signs in last 24 hours: Temp:  [97.8 F (36.6 C)-99.7 F (37.6 C)] 98.8 F (37.1 C) (01/30 1100) Pulse Rate:  [0-137] 0 (01/29 1558) Cardiac Rhythm:  [-] Sinus tachycardia (01/30 0800) Resp:  [0-32] 32 (01/30 1100) BP: (94-144)/(62-123) 118/75 mmHg (01/30 1100) SpO2:  [0 %-99 %] 97 % (01/30 0900) Weight:  [161 lb 13.1 oz (73.4 kg)] 161 lb 13.1 oz (73.4 kg) (01/30 0500)  Hemodynamic parameters for last 24 hours: PAP: (39-52)/(23-32) 43/29 mmHg CVP:  [1 mmHg-14 mmHg] 7 mmHg PCWP:  [17 mmHg-22 mmHg] 22 mmHg CO:  [3 L/min-3.3 L/min] 3.1 L/min CI:  [1.6 L/min/m2-1.7 L/min/m2] 1.6 L/min/m2  Intake/Output from previous day: 01/29 0701 - 01/30 0700 In: 1084.6 [P.O.:480; I.V.:594.6] Out: 1470 [Urine:1470] Intake/Output this shift: Total I/O In: 470.9 [P.O.:350; I.V.:120.9] Out: 1090 [Urine:1090]       Exam    General- alert and comfortable   Lungs- clear without rales, wheezes   Cor- regular rate and rhythm, no murmur , gallop   Abdomen- soft, non-tender   Extremities - warm, non-tender, minimal edema- IABP in R fem artery   Neuro- oriented, appropriate, no focal weakness   Lab Results:  Recent Labs  05/06/15 2235 05/09/15 0500  WBC  --  7.4  HGB 16.3 14.5  HCT 48.0 41.6  PLT  --  180   BMET:  Recent Labs  05/08/15 0539 05/09/15 0500  NA  129* 131*  K 3.3* 4.2  CL 86* 89*  CO2 31 28  GLUCOSE 176* 137*  BUN 18 22*  CREATININE 1.12 1.28*  CALCIUM 8.5* 8.9    PT/INR: No results for input(s): LABPROT, INR in the last 72 hours. ABG    Component Value Date/Time   HCO3 28.8* 05/08/2015 1532   TCO2 30 05/08/2015 1532   O2SAT 53.2 05/09/2015 0455   CBG (last 3)  No results for input(s): GLUCAP in the last 72 hours.  Assessment/Plan: S/P Procedure(s) (LRB): Right Heart Cath (N/A) IABP Insertion (N/A) VAD implantation in am Procedure discussed with patient and wife in detail   LOS: 10 days    Patrick Humphrey 05/09/2015

## 2015-05-09 NOTE — Progress Notes (Signed)
[employer] for heparin Indication: IABP  Allergies  Allergen Reactions  . Penicillins     Has patient had a PCN reaction causing immediate rash, facial/tongue/throat swelling, SOB or lightheadedness with hypotension: Yes Has patient had a PCN reaction causing severe rash involving mucus membranes or skin necrosis: No Has patient had a PCN reaction that required hospitalization No Has patient had a PCN reaction occurring within the last 10 years: No If all of the above answers are "NO", then may proceed with Cephalosporin use.  Unknown-childhood  . Sulfa Antibiotics     Patient Measurements: Height: 5\' 11"  (180.3 cm) Weight: 161 lb (73.029 kg) IBW/kg (Calculated) : 75.3 Heparin Dosing Weight: 73kg  Vital Signs: Temp: 98.4 F (36.9 C) (01/30 0009) Temp Source: Core (Comment) (01/30 0000) BP: 129/97 mmHg (01/30 0000) Pulse Rate: 0 (01/29 1558)  Labs:  Recent Labs  05/06/15 2235  05/07/15 0500 05/07/15 0605 05/08/15 0539 05/08/15 2356  HGB 16.3  --   --   --   --   --   HCT 48.0  --   --   --   --   --   HEPARINUNFRC  --   --   --   --   --  0.31  CREATININE 1.10  < > 0.80 1.26* 1.12  --   < > = values in this interval not displayed.  Estimated Creatinine Clearance: 81.5 mL/min (by C-G formula based on Cr of 1.12).   Medical History: Past Medical History  Diagnosis Date  . Nonischemic cardiomyopathy ([place])   . ICD (implantable cardioverter-defibrillator), single, in situ 08/26/2007  . Hx of echocardiogram 11/09/1910    Showed an EF of 25% with no significant valve disease.  Marland Kitchen AICD (automatic cardioverter/defibrillator) present   . Presence of permanent cardiac pacemaker   . CHF (congestive heart failure) (H[facility])     Medications:  Scheduled:  . aspirin EC  81 mg Oral Daily  . digoxin  0.125 mg Oral Daily  . feeding supplement  1 Container Oral Q24H  . feeding supplement (ENSURE ENLIVE)  237 mL Oral Q24H  . furosemide  60 mg  Intravenous Once  . furosemide  60 mg Intravenous Once  . furosemide  80 mg Intravenous BID  . loratadine  10 mg Oral Daily  . magnesium oxide  400 mg Oral Daily  . montelukast  10 mg Oral QHS  . pantoprazole  40 mg Oral Daily  . sodium chloride  10-40 mL Intracatheter Q12H  . sodium chloride  3 mL Intravenous Q12H  . sodium chloride flush  3 mL Intravenous Q12H  . sodium chloride flush  3 mL Intravenous Q12H  . spironolactone  12.5 mg Oral Daily   Infusions:  . heparin 1,100 Units/hr (05/08/15 2000)  . milrinone 0.375 mcg/kg/min (05/08/15 2000)  . norepinephrine (LEVOPHED) Adult infusion 10 mcg/min (05/08/15 2000)    Assessment: 50 yo who was admitted for a long hx CHF. He got a IABP today while waiting for a possible LVAD. Heparin has been ordered for anticoagulation.   Initial HL therapeutic at 0.31  Goal of Therapy:  Heparin level 0.2-0.5 units/ml Monitor platelets by anticoagulation protocol: Yes   Plan:  Continue heparin infusion at 1100 units/hr Daily level and CBC  Thank you Anette Guarneri, PharmD [phone] 05/09/2015 12:41 AM

## 2015-05-09 NOTE — Progress Notes (Signed)
ANTICOAGULATION CONSULT NOTE Pharmacy Consult for heparin Indication: IABP  Patient Measurements: Height: 5\' 11"  (180.3 cm) Weight: 161 lb 13.1 oz (73.4 kg) IBW/kg (Calculated) : 75.3 Heparin Dosing Weight: 73kg  Vital Signs: Temp: 98.8 F (37.1 C) (01/30 1000) Temp Source: Core (Comment) (01/30 0400) BP: 117/71 mmHg (01/30 1000)  Labs:  Recent Labs  05/06/15 2235  05/07/15 0605 05/08/15 0539 05/08/15 2356 05/09/15 0500 05/09/15 1045  HGB 16.3  --   --   --   --  14.5  --   HCT 48.0  --   --   --   --  41.6  --   PLT  --   --   --   --   --  180  --   HEPARINUNFRC  --   --   --   --  0.31  --  0.17*  CREATININE 1.10  < > 1.26* 1.12  --  1.28*  --   < > = values in this interval not displayed.  Estimated Creatinine Clearance: 71.7 mL/min (by C-G formula based on Cr of 1.28).   Medical History: Past Medical History  Diagnosis Date  . Nonischemic cardiomyopathy ([place])   . ICD (implantable cardioverter-defibrillator), single, in situ 08/26/2007  . Hx of echocardiogram 11/09/1910    Showed an EF of 25% with no significant valve disease.  Marland Kitchen AICD (automatic cardioverter/defibrillator) present   . Presence of permanent cardiac pacemaker   . CHF (congestive heart failure) (HCC)     Infusions:  . heparin 1,100 Units/hr (05/08/15 2000)  . milrinone 0.5 mcg/kg/min (05/09/15 0735)  . norepinephrine (LEVOPHED) Adult infusion 8 mcg/min (05/09/15 0900)    Assessment: 50 yo who was admitted for a long hx CHF. He got a IABP over weekend while waiting for LVAD scheduled for tomorrow morning. Heparin has been ordered for anticoagulation.   Initial HL therapeutic at 0.31 and folow up level now below goal at 0.17. Hgb 14.5, pltc stable at 180. No bleeding noted.  Goal of Therapy:  Heparin level 0.2-0.5 units/ml Monitor platelets by anticoagulation protocol: Yes   Plan:  Increase heparin infusion to 1200 units/hr Daily level and CBC  Thank you,  Erin Hearing PharmD.,  BCPS Clinical Pharmacist Pager [phone] 05/09/2015 11:19 AM

## 2015-05-09 NOTE — Progress Notes (Signed)
Patient PAP pressure waveform has been dampened since last night, more positional this shift, at times not giving readings, line checked flushed and zeroed, called Cards Fellow, Patrick Humphrey currently at 76 cm original charting from cath lab 79 cm, no change in vital signs or heart rhythm, orders to obtain CXR in the morning. Will continue to monitor closely.

## 2015-05-09 NOTE — Progress Notes (Signed)
LVAD Initial Psychosocial Screening  Date/Time Initiated:  05/09/2015 9:30am Referral Source:  Janene Madeira, [place] Coordinator Referral Reason:  VAD implantation Source of Information:  Patient, wife and chart review  Demographics Name:  Patrick Humphrey Address:  [place], [place] [address] Home phone: n/a  Cell: [phone] Marital Status: Married  Faith: Christian   Primary Language:  Ilda Mori: [phone]    DOB:  [birth year]  Medical & Follow-up Adherence to Medical regimen/INR checks:  compliant Medication adherence:  compliant Physician/Clinic Appointment Attendance:  compliant   Advance Directives: Do you have a Living Will or Medical POA?  Completing prior to surgery Would you like to complete a Living Will and Medical POA prior to surgery?  yes Do you have Goals of Care?  Yes-discussed and reviewed Have you had a consult with the Palliative Care Team at [facility]? yes  Psychological Health Appearance:  Well groomed in hospital gown Mental Status:  Alert and oriented Eye Contact:  good Thought Content:  coherent Speech:  clear Mood:  stable Affect:  positive Insight:  good Judgement: sound Interaction Style:    Family/Social Information Who lives in your home? Name:   Relationship:   Patrick Humphrey   Wife Josh (82yo)  Son Olen Cordial [phone])  Grandson (3-4 x weekly stays with his father Merrily Pew) Other family members/support persons in your life? Name:   Relationship:   [place]   (live in [place]) [place] [phone])  [place] Needs Who is the primary caregiver? [place] status:  DM/healthy Do you drive?  yes Do you work?  Yes/ taking FMLA Physical Limitations:  none Do you have other care giving responsibilities?  none  Who is the secondary caregiver? [place] status:  good Do you drive?  yes Do you work?  no Physical Limitations:  none Do you have other care giving  responsibilities?  no Contact number:  Home Environment/Personal Care Do you own or rent your home? own Number of steps into the home? 8 steps How many levels in the home? One level Assistive devices in the home? no Electrical needs for LVAD (3 prong outlets)? yes Second hand smoke exposure in the home? Wife smokes although will smoke outside Travel distance from [place]? 45 minutes Self-care: independent Ambulation: independent  Community Are you active with community agencies/resources/homecare? 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 [employer] community Are you active in any clubs or social organizations? Music ministry at church What do you do for fun?  Hobbies?  Interests?  Fishing, golfing and outdoors  Education/Work Information What is the last grade of school you completed? 12th grade Preferred method of learning?  Hands on Do you have any problems with reading or writing?  no Are you currently employed?  no  When were you last employed? 15 years ago  Name of employer? 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 14 years If you are not working, do you plan to return to work after VAD surgery? no If yes, what type of employment do you hope to find? Are you interested in job training or learning new skills? no Did you serve in the [place]?  If  so, what branch? no  Financial Information What is your source of income? SSD and wife employed Clinical cytogeneticist) Do you have difficulty meeting your monthly expenses? no If yes, which ones? n/a How do you cope with this? n/a Can you budget for the monthly cost for dressing supplies post procedure?  yes Primary Health insurance:  [employer] Secondary Insurance: n/a Prescription plan: Medicare  D Pharmacy:   What are your prescription co-pays? Varies mostly $2 Do you use mail order for your prescriptions?  no Have you ever had to refuse medication due to cost?  no Have you applied for Medicaid?  no   Medical Information Briefly describe why you are here for evaluation: Started in 2002 with dilated cardiomyopathy  Do you have a PCP or other medical provider? [facility], MD Are you able to complete your ADL's? Independent  Do you have a history of trauma, physical, emotional, or sexual abuse? none Do you have any family history of heart problems? Dad-defibrillator  Sister- Afib Do you smoke now or past usage?  no  Quit date: "long time ago" Do you drink alcohol now or past usage?  yes - whiskey a night until 6-7 months ago Are you currently using illegal drugs or misuse of medication or past usage?  none Have you ever been treated for substance abuse? none        Mental Health History How have you been feeling in the past year? On the decline- knowing something is not right Have you ever had any problems with depression, anxiety or other mental health issues? none Do you see a counselor, psychiatrist or therapist?  no If you are currently experiencing problems are you interested in talking with a professional? none Have you or are you taking medications for anxiety/depression or any mental health concerns?  Mild anxiety Current Medications: xanax PRN in the past but not taken recently What are your coping strategies under stressful situations? "use to run and workout" Now prayer and talk with my wife Are there any other stressors in your life?not really Have you had any past or current thoughts of suicide? no How many hours do you sleep at night? 8-10 hours normally How is your appetite? Good when I feel good Would you be interested in attending the LVAD support group? yes  PHQ2 Depression Scale: 0 PHQ9 Depression scale (if positive PHQ2 screen):    n/a Hospital Anxiety  and Depression Screen (HADS) score: 6  Legal Do you currently have any legal issues/problems?  no Do you have a Durable POA?  no Name of [place]? Contact number:    Plan for VAD Implementation Do you know and understand what happens during the VAD surgery? Patient and wife described the surgery and post surgery recovery in detail. What do you know about the risks and side effect associated with VAD surgery? Patient verbalizes understanding of stroke, infection and death. Explain what will happen right after surgery:   Patient describes transfer to SICU and aware of intubation and ICU stay.  What is your plan for transportation for the first 8 weeks post-surgery? (Patients are not recommended to drive post-surgery for 8 weeks)  Driver:    Wife- Patrick Dy Do you have airbags in your vehicle? YES There is a risk of discharging the device if the airbag were to deploy. What do you know about your diet post-surgery?   Heart Healthy How do you plan to monitor your medications, current and future?  Pill box  How  do you plan to complete ADL's post-surgery?  Wife will assist Will it be difficult to ask for help from your caregivers?  no  Please explain what you hope will be improved about your life as a result of receiving the LVAD? Looking forward to feeling better- haven't felt good for 15 years Please tell me your biggest concern or fear about living with the LVAD?  No fears- strong faith How do you cope with your concerns and fears?  "Godly counsel" see people at the [facility] to talk too Please explain your understanding of how their body will change?  Patient teased about tattoo on his chest reading "Black n Burnett Harry" Are you worried about these changes? Not at all Do you see any barriers to your surgery or follow-up? none  Understanding of LVAD Patient states understanding of the following: Surgical procedures and risks, Electrical need for LVAD (3 prong outlets), Safety precautions with LVAD (water,  etc.), LVAD daily self-care (dressing changes, computer check, extra supplies), Outpatient follow up (LVAD clinic appts, monitoring blood thinners) and Need for Emergency Planning  Discussed and Reviewed with Patient and Caregiver  Patient's current level of motivation to prepare for LVAD:  Highly motivated Patient's present Level of Consent for LVAD: "Ready"    Education provided to patient/family/caregiver:   Caregiver role and responsibiltiy, Financial planning for LVAD, Role of Clinical Social Worker and Signs of Depression and Anxiety     Caregiver questions Please explain what you hope will be improved about your life and loved one's life as a result of receiving the LVAD? "God's got great things for [place]"  What is your biggest concern or fear about caregiving with an LVAD patient?  No fear- strong faith What is your plan for availability to provide care 24/7 x2 weeks post op and dressing changes ongoing?  FMLA paperwork already submitted Who is the relief/backup caregiver and what is their availability?  Lattie Haw and currently not employed  Preferred method of learning? Written and Hands on  Do you drive? yes How do you handle stressful situations?  Robot mode then breakdown Do you think you can do this?  I know I can Is there anything that concerns about caregiving?  no Do you provide caregiving to anyone else? No- occasional grandchildren HADS [facility] Anxiety and Depression Screen score) Caregiver:  9   Caregiver's current level of motivation to prepare for LVAD:  Very motivated and supportive Caregiver's present level of consent for LVAD: I'm ready  Clinical Interventions Needed:    Patient and wife requested to complete Advanced Directives prior to surgery. Chaplain office has been notified. CSW discussed attending the LVAD Support Group post surgery and the LVAD Buddy program. CSW will provide supportive intervention throughout implant hospitalization and on outpatient basis  through the VAD clinic.  Clinical Impressions/Recommendations:   Patient is a 50 yo male who has been married for 23 years and has 2 adult sons. 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 He reports he is compliant with his medical regimen and interested in completing an Advanced Directives. Patient has a large extended family who he reports are very supportive and will assist with care needs and support his wife. 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 Patient and wife both spoke about their strong faith and involvement with the church community.  He reports he receives SSD for the past 15 years and  wife is employed. He denies any financial concerns and no issues with dressing supply costs. Patient denies any history of trauma or abuse. He noted past history of tobacco use but quit a long time ago. He reports a whiskey a night but stopped 6-7 months ago. He denied any history of substance abuse. Patient reports no history of mental health although had a prescription for Xanax PRN prescribed a while back for mild anxiety. He denies any current use. Patient states that he uses prayer and talking with his wife as his coping mechanism during stressful times. Patient scored a 0 on the PHQ-2 and a 6 on the HADS. Neither score are significant for depression or anxiety. Patient appears to have a good understanding of the LVAD surgery and recovery. Patient reports no fear and that his strong faith in God will be a support through the surgery. Patient denies any concerns about body image and joked about a tattoo on his chest stating Black n [place]cker. Patient appears to be highly motivated to feel better and looking forward to getting the LVAD and moving forward. Patient's wife appears very supportive and states " God's got great things for [place]".  Wife will take FMLA and provide all caregiving needs with the support of  family and friends. Patient appears to be an excellent candidate for LVAD implantation and states he is ready!   Louann Liv, [place]

## 2015-05-09 NOTE — Progress Notes (Addendum)
Patient ID: Patrick Humphrey, male   DOB: [birth year], 50 y.o.   MRN: [id number]   SUBJECTIVE:   Admitted with marked volume overload. Diuresed with IV lasix and started on 0.25 mcg milrinone.   Failed milrinone wean on 1/26 with  CO-OX down to 37% and CVP up 13. Restarted on milrinone 0.25 mcg and IV lasix was restarted.   1/29 he declined with profound cardiogenic shock. Swan/IABP placed. Remains on milrinone 0.375 mcg + norepi 10 mcg.   Overall feeling ok. Denies nausea/SOB   Todays swan numbers  RA 8  PCWP 20  CI 1.6  RHC/LHC 05/04/15 (off milrinone) No coronary disease RA mean 9  RV 47/11 PA 50/29 mean 36 PCWP mean 21 LV 80/21 AO 80/60 Oxygen saturations: PA 60% AO 97% Cardiac Output (Fick) 3.7  Cardiac Index (Fick) 1.9 PVR 4 WU  RHC 1/29 RA 15 PA 58/38 mean 47 PCWP 32 CI 1.28 PVR 6.2 WU  IABP inserted  Scheduled Meds: . aspirin EC  81 mg Oral Daily  . digoxin  0.125 mg Oral Daily  . feeding supplement  1 Container Oral Q24H  . feeding supplement (ENSURE ENLIVE)  237 mL Oral Q24H  . furosemide  60 mg Intravenous Once  . furosemide  60 mg Intravenous Once  . furosemide  80 mg Intravenous BID  . loratadine  10 mg Oral Daily  . magnesium oxide  400 mg Oral Daily  . montelukast  10 mg Oral QHS  . pantoprazole  40 mg Oral Daily  . sodium chloride  10-40 mL Intracatheter Q12H  . sodium chloride  3 mL Intravenous Q12H  . sodium chloride flush  3 mL Intravenous Q12H  . sodium chloride flush  3 mL Intravenous Q12H  . spironolactone  12.5 mg Oral Daily   Continuous Infusions: . heparin 1,100 Units/hr (05/08/15 2000)  . milrinone 0.375 mcg/kg/min (05/09/15 0153)  . norepinephrine (LEVOPHED) Adult infusion 10 mcg/min (05/08/15 2000)   PRN Meds:.sodium chloride, sodium chloride, sodium chloride, acetaminophen, acetaminophen, ALPRAZolam, guaiFENesin-dextromethorphan, ondansetron (ZOFRAN) IV, ondansetron (ZOFRAN) IV, sodium chloride, sodium chloride, sodium  chloride flush, sodium chloride flush, zolpidem    Filed Vitals:   05/09/15 0400 05/09/15 0500 05/09/15 0600 05/09/15 0700  BP: 104/80 124/91 135/100 126/82  Pulse:      Temp: 98.8 F (37.1 C) 98.6 F (37 C) 98.8 F (37.1 C) 98.8 F (37.1 C)  TempSrc: Core (Comment)     Resp: 21 12 22 28   Height:      Weight:  161 lb 13.1 oz (73.4 kg)    SpO2: 90% 95% 95% 93%    Intake/Output Summary (Last 24 hours) at 05/09/15 0719 Last data filed at 05/09/15 0700  Gross per 24 hour  Intake 1084.6 ml  Output   1470 ml  Net -385.4 ml    LABS: Basic Metabolic Panel:  Recent Labs  05/08/15 0539 05/09/15 0500  NA 129* 131*  K 3.3* 4.2  CL 86* 89*  CO2 31 28  GLUCOSE 176* 137*  BUN 18 22*  CREATININE 1.12 1.28*  CALCIUM 8.5* 8.9   Liver Function Tests:  Recent Labs  05/09/15 0500  AST 68*  ALT 38  ALKPHOS 81  BILITOT 3.5*  PROT 6.5  ALBUMIN 3.0*   No results for input(s): LIPASE, AMYLASE in the last 72 hours. CBC:  Recent Labs  05/06/15 2235 05/09/15 0500  WBC  --  7.4  HGB 16.3 14.5  HCT 48.0 41.6  MCV  --  94.5  PLT  --  180   Cardiac Enzymes: No results for input(s): CKTOTAL, CKMB, CKMBINDEX, TROPONINI in the last 72 hours. BNP: Invalid input(s): POCBNP D-Dimer: No results for input(s): DDIMER in the last 72 hours. Hemoglobin A1C: No results for input(s): HGBA1C in the last 72 hours. Fasting Lipid Panel: No results for input(s): CHOL, HDL, LDLCALC, TRIG, CHOLHDL, LDLDIRECT in the last 72 hours. Thyroid Function Tests: No results for input(s): TSH, T4TOTAL, T3FREE, THYROIDAB in the last 72 hours.  Invalid input(s): FREET3 Anemia Panel: No results for input(s): VITAMINB12, FOLATE, FERRITIN, TIBC, IRON, RETICCTPCT in the last 72 hours.  RADIOLOGY: Dg Orthopantogram  05/04/2015  CLINICAL DATA:  Evaluate for left ventricular assist device placement. Initial encounter. EXAM: ORTHOPANTOGRAM/PANORAMIC COMPARISON:  None. FINDINGS: There appears to be  relatively recent absence of the right first mandibular molar. Remaining dentition is unremarkable in appearance. No periapical abscesses are seen. Dental hardware is noted at the left first mandibular molar. There is no evidence of fracture or dislocation. The visualized paranasal sinuses are well-aerated. IMPRESSION: No acute abnormality seen with regard to the dentition. Relatively recent absence of the right first mandibular molar, and dental hardware at the left first mandibular molar. Electronically Signed   By: Garald Balding M.D.   On: 05/04/2015 22:23   Dg Chest 2 View  04/30/2015  CLINICAL DATA:  Cough, allergy induced asthma, history of pacemaker, CHF, nonsmoker. EXAM: CHEST  2 VIEW COMPARISON:  Chest x-rays dated 04/23/2015 and 01/06/2015. FINDINGS: New airspace opacity is present at the right lung base. Suspect small adjacent right pleural effusion. Chronic mild atelectasis/scarring noted at the left lung base. Mild cardiomegaly is unchanged. Right chest wall pacemaker/AICD is stable in position. No acute osseous abnormality seen. IMPRESSION: 1. New airspace opacity at the right lung base. This could represent atelectasis, pneumonia or aspiration. If febrile, would certainly favor pneumonia. Suspect small adjacent right pleural effusion. 2. Cardiomegaly, stable. No evidence of significant volume overload/CHF. Electronically Signed   By: Franki Cabot M.D.   On: 04/30/2015 08:59   Ct Chest W Contrast  05/06/2015  CLINICAL DATA:  Cardiomyopathy.  Preop for cardiac transplant. EXAM: CT CHEST, ABDOMEN, AND PELVIS WITH CONTRAST TECHNIQUE: Multidetector CT imaging of the chest, abdomen and pelvis was performed following the standard protocol during bolus administration of intravenous contrast. CONTRAST:  144mL OMNIPAQUE IOHEXOL 300 MG/ML  SOLN COMPARISON:  03/02/2012 FINDINGS: CT CHEST There are small mediastinal nodes. None are greater than 10 mm in short axis diameter. Aorta is non aneurysmal and  patent. No evidence of dissection or intramural hematoma. There is no obvious evidence of acute pulmonary thromboembolism. There is no evidence of enlargement of the pulmonary arterial structures to suggest pulmonary hypertension. The right ventricle and right atrium are normal in size. The left ventricle is markedly dilated with thinning of the myocardium. Left atrium is mildly dilated. Right subclavian pacemaker device is in place. Tips of the leads are in the right atrium and right ventricle. No pericardial effusion. There is a small amount of gas in the right atrium likely related to venous injection. Moderate right pleural effusion.  Small left pleural effusion. Minimal dependent atelectasis towards the lung bases. There is some peribronchovascular soft tissue thickening in the hilar regions which may be related to volume overload or an chronic bronchitic changes. No vertebral compression deformity. CT ABDOMEN AND PELVIS Liver, spleen, pancreas, and adrenal glands are within normal limits. There is high-density material in the gallbladder compatible with either sludge or  milk of calcium bile There is a small wedge-shaped area of low density in the mid right kidney worrisome for a small renal infarct. See image 32 of series 301. Unremarkable appearance of the left kidney. No hydronephrosis. There is no free intraperitoneal fluid. There is stranding in the perirectal and presacral space is within the pelvis of unknown significance. Bladder is unremarkable. Prostate is mildly enlarged. Gas in the subcutaneous fat of the left lower quadrant likely reflects subcutaneous injection. No vertebral compression deformity. IMPRESSION: There is no convincing evidence of malignancy, pulmonary embolism or pulmonary hypertension. There is dilatation of the left ventricle consistent with a history of cardiomyopathy Bilateral pleural effusions as described. Non pathologically enlarged lymph nodes and peribronchovascular soft  tissue thickening are noted. This most likely reflects volume overload or an inflammatory process. Small right renal infarct. Stranding within the perirectal fat. Differential diagnosis includes volume overload or an inflammatory process. Electronically Signed   By: Marybelle Killings M.D.   On: 05/06/2015 08:05   Ct Abdomen Pelvis W Contrast  05/06/2015  CLINICAL DATA:  Cardiomyopathy.  Preop for cardiac transplant. EXAM: CT CHEST, ABDOMEN, AND PELVIS WITH CONTRAST TECHNIQUE: Multidetector CT imaging of the chest, abdomen and pelvis was performed following the standard protocol during bolus administration of intravenous contrast. CONTRAST:  128mL OMNIPAQUE IOHEXOL 300 MG/ML  SOLN COMPARISON:  03/02/2012 FINDINGS: CT CHEST There are small mediastinal nodes. None are greater than 10 mm in short axis diameter. Aorta is non aneurysmal and patent. No evidence of dissection or intramural hematoma. There is no obvious evidence of acute pulmonary thromboembolism. There is no evidence of enlargement of the pulmonary arterial structures to suggest pulmonary hypertension. The right ventricle and right atrium are normal in size. The left ventricle is markedly dilated with thinning of the myocardium. Left atrium is mildly dilated. Right subclavian pacemaker device is in place. Tips of the leads are in the right atrium and right ventricle. No pericardial effusion. There is a small amount of gas in the right atrium likely related to venous injection. Moderate right pleural effusion.  Small left pleural effusion. Minimal dependent atelectasis towards the lung bases. There is some peribronchovascular soft tissue thickening in the hilar regions which may be related to volume overload or an chronic bronchitic changes. No vertebral compression deformity. CT ABDOMEN AND PELVIS Liver, spleen, pancreas, and adrenal glands are within normal limits. There is high-density material in the gallbladder compatible with either sludge or milk of  calcium bile There is a small wedge-shaped area of low density in the mid right kidney worrisome for a small renal infarct. See image 32 of series 301. Unremarkable appearance of the left kidney. No hydronephrosis. There is no free intraperitoneal fluid. There is stranding in the perirectal and presacral space is within the pelvis of unknown significance. Bladder is unremarkable. Prostate is mildly enlarged. Gas in the subcutaneous fat of the left lower quadrant likely reflects subcutaneous injection. No vertebral compression deformity. IMPRESSION: There is no convincing evidence of malignancy, pulmonary embolism or pulmonary hypertension. There is dilatation of the left ventricle consistent with a history of cardiomyopathy Bilateral pleural effusions as described. Non pathologically enlarged lymph nodes and peribronchovascular soft tissue thickening are noted. This most likely reflects volume overload or an inflammatory process. Small right renal infarct. Stranding within the perirectal fat. Differential diagnosis includes volume overload or an inflammatory process. Electronically Signed   By: Marybelle Killings M.D.   On: 05/06/2015 08:05    PHYSICAL EXAM CVP 8 . General:  NAD. In bed.  Neck: JVP  no thyromegaly or thyroid nodule.  Lungs: Clear to auscultation bilaterally with normal respiratory effort. CV: Lateral PMI.  Heart tachy regular S1/S2, + ectopy + S3, no murmur.  No peripheral edema.   Abdomen: Soft, nontender, no hepatosplenomegaly.  Mild distention Neurologic: Alert and oriented x 3.  Psych: Normal affect. Extremities: No clubbing or cyanosis. LUE PICC R groin swan . R and L pedal pulse 2+   TELEMETRY: Sinus tachy with PVCs.100-110s    ASSESSMENT AND PLAN: 50 yo with history of nonischemic cardiomyopathy since 2002 was admitted with volume overload and exertional dyspnea, NYHA class IV.   1. Acute on chronic systolic CHF -> Cardiogenic shock.  Nonischemic cardiomyopathy since 2002.  Echo  (12/16) with EF 20-25%, moderate MR, moderately decreased RV systolic function.  Cath in 2002 at time of diagnosis with normal coronaries.  Has [employer] ICD.  Failed milrinone wean 1/27. Milrinone restarted. Norepi started 1/27.  IABP/Swan placed 1/29.   - Todays CO-OX is 53% with CI 1.6 on milrinone 0.375 and norepi 79mcg due to ongoing poor hemodynamics.  Increase milrinone to 0.5 mcg - Continue IV lasix bid. Supp K+ - Hold carvedilol and lisinopril. Continue digoxin.  - Discussed with VAD team. VAD tentatively moved up to Tuesday pending insurance approval.  CT abd/pelvis/chest completed. PFTs with moderate restriction & mildly reduced DLCO. Not candidate for colonoscopy currently. Plan to LVAD for DT tomorrow.  2. Elevated LFTs: Mild, noted at admission.  Suspect due to congestive hepatopathy.  3. Hypomagnesium: Mag repleted.   4. Hypokalemia: Todays K 4.2  5. Anticoagulation: Heparin gtt while on IABP.   Amy Clegg NP-C  05/09/2015 7:19 AM   Patient seen with NP, agree with the above note.  Hemodynamics remain marginal today.  Will increase milrinone to 0.5 and recheck.  IABP in place, 1:1.  Continue current IV Lasix.  Plan for LVAD tomorrow morning.  I discussed Mr Lutes's situation with Dr Artemio Aly at [facility] advanced heart failure/transplant clinic.  He agrees the best plan in this situation is LVAD implantation as DT.   35 minutes critical care time.  [name] 05/09/2015 7:34 AM

## 2015-05-09 NOTE — Progress Notes (Signed)
Subjective: Feeling well.  No complaints.  Objective: Vital signs in last 24 hours: Temp:  [97.8 F (36.6 C)-99.7 F (37.6 C)] 98.8 F (37.1 C) (01/30 0700) Pulse Rate:  [0-145] 0 (01/29 1558) Resp:  [0-28] 28 (01/30 0700) BP: (94-144)/(69-123) 126/82 mmHg (01/30 0700) SpO2:  [0 %-99 %] 93 % (01/30 0700) Weight:  [73.4 kg (161 lb 13.1 oz)] 73.4 kg (161 lb 13.1 oz) (01/30 0500) Last BM Date: 05/05/14  Intake/Output from previous day: 01/29 0701 - 01/30 0700 In: 1084.6 [P.O.:480; I.V.:594.6] Out: 1470 [Urine:1470] Intake/Output this shift:    General appearance: alert and no distress  Lab Results:  Recent Labs  05/06/15 2235 05/09/15 0500  WBC  --  7.4  HGB 16.3 14.5  HCT 48.0 41.6  PLT  --  180   BMET  Recent Labs  05/07/15 0605 05/08/15 0539 05/09/15 0500  NA 132* 129* 131*  K 3.5 3.3* 4.2  CL 90* 86* 89*  CO2 31 31 28   GLUCOSE 113* 176* 137*  BUN 18 18 22*  CREATININE 1.26* 1.12 1.28*  CALCIUM 8.8* 8.5* 8.9   LFT  Recent Labs  05/09/15 0500  PROT 6.5  ALBUMIN 3.0*  AST 68*  ALT 38  ALKPHOS 81  BILITOT 3.5*   PT/INR No results for input(s): LABPROT, INR in the last 72 hours. Hepatitis Panel No results for input(s): HEPBSAG, HCVAB, HEPAIGM, HEPBIGM in the last 72 hours. C-Diff No results for input(s): CDIFFTOX in the last 72 hours. Fecal Lactopherrin No results for input(s): FECLLACTOFRN in the last 72 hours.  Studies/Results: Dg Chest Port 1 View  05/09/2015  CLINICAL DATA:  Shortness of breath. EXAM: PORTABLE CHEST 1 VIEW COMPARISON:  CT 05/06/2015.  Chest x-ray 04/30/2015. FINDINGS: Swan-Ganz catheter with tip in the right main pulmonary artery. Left PICC line noted with its tip at the cavoatrial junction. Persistent right lower lobe infiltrate consistent pneumonia. No pleural effusion or pneumothorax. Cardiac pacer noted with lead tip projected over the right ventricle. Stable cardiomegaly . IMPRESSION: 1. Swan-Ganz catheter with tip in  the right main pulmonary artery. Left PICC line noted with tip at the cavoatrial junction. 2. Cardiac pacer in stable position. Stable cardiomegaly. No pulmonary venous congestion. 3. Persistent right lower lobe infiltrate consistent pneumonia. No significant interim change. Electronically Signed   By: Marcello Moores  Register   On: 05/09/2015 07:19    Medications:  Scheduled: . aspirin EC  81 mg Oral Daily  . digoxin  0.125 mg Oral Daily  . feeding supplement  1 Container Oral Q24H  . feeding supplement (ENSURE ENLIVE)  237 mL Oral Q24H  . furosemide  60 mg Intravenous Once  . furosemide  60 mg Intravenous Once  . furosemide  80 mg Intravenous BID  . loratadine  10 mg Oral Daily  . magnesium oxide  400 mg Oral Daily  . montelukast  10 mg Oral QHS  . pantoprazole  40 mg Oral Daily  . [START ON 05/10/2015] rifampin (RIFADIN) IVPB  600 mg Intravenous Q24H  . sodium chloride  10-40 mL Intracatheter Q12H  . sodium chloride  3 mL Intravenous Q12H  . sodium chloride flush  3 mL Intravenous Q12H  . sodium chloride flush  3 mL Intravenous Q12H  . spironolactone  12.5 mg Oral Daily   Continuous: . heparin 1,100 Units/hr (05/08/15 2000)  . milrinone 0.5 mcg/kg/min (05/09/15 0735)  . norepinephrine (LEVOPHED) Adult infusion 10 mcg/min (05/08/15 2000)    Assessment/Plan: 1) CHF on IABP.  I reviewed the weekend events.  He is well with the IABP and the plan is for LVAD insertion tomorrow.  Colonoscopy cancelled.  This can be performed in the future on an elective basis once all cardiac issues have stabilized.  Plan: 1) LVAD per Cardiology. 2) Signing off.   LOS: 10 days   Rayanna Matusik D 05/09/2015, 8:01 AM

## 2015-05-09 NOTE — Anesthesia Preprocedure Evaluation (Addendum)
Anesthesia Evaluation  Patient identified by MRN, date of birth, ID band Patient awake    Reviewed: Allergy & Precautions, NPO status , Patient's Chart, lab work & pertinent test results  History of Anesthesia Complications Negative for: history of anesthetic complications  Airway Mallampati: II  TM Distance: >3 FB Neck ROM: Full    Dental  (+) Teeth Intact, Dental Advisory Given   Pulmonary former smoker,    Pulmonary exam normal        Cardiovascular +CHF  Normal cardiovascular exam+ pacemaker + Cardiac Defibrillator      Neuro/Psych negative neurological ROS  negative psych ROS   GI/Hepatic negative GI ROS, Neg liver ROS,   Endo/Other  negative endocrine ROS  Renal/GU negative Renal ROS     Musculoskeletal   Abdominal   Peds  Hematology negative hematology ROS (+)   Anesthesia Other Findings   Reproductive/Obstetrics                            Anesthesia Physical Anesthesia Plan  ASA: IV  Anesthesia Plan: General   Post-op Pain Management:    Induction: Intravenous  Airway Management Planned: Oral ETT  Additional Equipment: Arterial line, 3D TEE, PA Cath and Ultrasound Guidance Line Placement  Intra-op Plan:   Post-operative Plan: Post-operative intubation/ventilation  Informed Consent: I have reviewed the patients History and Physical, chart, labs and discussed the procedure including the risks, benefits and alternatives for the proposed anesthesia with the patient or authorized representative who has indicated his/her understanding and acceptance.   Dental advisory given  Plan Discussed with: CRNA, Anesthesiologist and Surgeon  Anesthesia Plan Comments:        Anesthesia Quick Evaluation

## 2015-05-10 ENCOUNTER — Other Ambulatory Visit (HOSPITAL_COMMUNITY): Payer: Medicare Other

## 2015-05-10 ENCOUNTER — Inpatient Hospital Stay (HOSPITAL_COMMUNITY): Payer: Medicare Other | Admitting: Certified Registered Nurse Anesthetist

## 2015-05-10 ENCOUNTER — Inpatient Hospital Stay (HOSPITAL_COMMUNITY): Payer: Medicare Other

## 2015-05-10 ENCOUNTER — Encounter (HOSPITAL_COMMUNITY)
Admission: AD | Disposition: A | Discharge status: Home Health | Payer: Medicare Other | Source: Ambulatory Visit | Attending: Cardiothoracic Surgery

## 2015-05-10 DIAGNOSIS — Z95811 Presence of heart assist device: Secondary | ICD-10-CM

## 2015-05-10 DIAGNOSIS — I5023 Acute on chronic systolic (congestive) heart failure: Secondary | ICD-10-CM

## 2015-05-10 DIAGNOSIS — I255 Ischemic cardiomyopathy: Secondary | ICD-10-CM

## 2015-05-10 HISTORY — PX: TEE WITHOUT CARDIOVERSION: SHX5443

## 2015-05-10 HISTORY — PX: INSERTION OF IMPLANTABLE LEFT VENTRICULAR ASSIST DEVICE: SHX5866

## 2015-05-10 LAB — POCT I-STAT, CHEM 8
BUN: 20 mg/dL (ref 6–20)
BUN: 19 mg/dL (ref 6–20)
BUN: 17 mg/dL (ref 6–20)
BUN: 18 mg/dL (ref 6–20)
BUN: 17 mg/dL (ref 6–20)
BUN: 16 mg/dL (ref 6–20)
BUN: 14 mg/dL (ref 6–20)
Calcium, Ion: 1.08 mmol/L — ABNORMAL LOW (ref 1.12–1.23)
Calcium, Ion: 1.07 mmol/L — ABNORMAL LOW (ref 1.12–1.23)
Calcium, Ion: 0.95 mmol/L — ABNORMAL LOW (ref 1.12–1.23)
Calcium, Ion: 0.98 mmol/L — ABNORMAL LOW (ref 1.12–1.23)
Calcium, Ion: 0.85 mmol/L — ABNORMAL LOW (ref 1.12–1.23)
Calcium, Ion: 1.02 mmol/L — ABNORMAL LOW (ref 1.12–1.23)
Calcium, Ion: 1.11 mmol/L — ABNORMAL LOW (ref 1.12–1.23)
Chloride: 89 mmol/L — ABNORMAL LOW (ref 101–111)
Chloride: 91 mmol/L — ABNORMAL LOW (ref 101–111)
Chloride: 87 mmol/L — ABNORMAL LOW (ref 101–111)
Chloride: 88 mmol/L — ABNORMAL LOW (ref 101–111)
Chloride: 90 mmol/L — ABNORMAL LOW (ref 101–111)
Chloride: 94 mmol/L — ABNORMAL LOW (ref 101–111)
Chloride: 97 mmol/L — ABNORMAL LOW (ref 101–111)
Creatinine, Ser: 0.8 mg/dL (ref 0.61–1.24)
Creatinine, Ser: 0.7 mg/dL (ref 0.61–1.24)
Creatinine, Ser: 0.6 mg/dL — ABNORMAL LOW (ref 0.61–1.24)
Creatinine, Ser: 0.6 mg/dL — ABNORMAL LOW (ref 0.61–1.24)
Creatinine, Ser: 0.6 mg/dL — ABNORMAL LOW (ref 0.61–1.24)
Creatinine, Ser: 0.7 mg/dL (ref 0.61–1.24)
Creatinine, Ser: 0.7 mg/dL (ref 0.61–1.24)
Glucose, Bld: 118 mg/dL — ABNORMAL HIGH (ref 65–99)
Glucose, Bld: 96 mg/dL (ref 65–99)
Glucose, Bld: 110 mg/dL — ABNORMAL HIGH (ref 65–99)
Glucose, Bld: 111 mg/dL — ABNORMAL HIGH (ref 65–99)
Glucose, Bld: 124 mg/dL — ABNORMAL HIGH (ref 65–99)
Glucose, Bld: 126 mg/dL — ABNORMAL HIGH (ref 65–99)
Glucose, Bld: 85 mg/dL (ref 65–99)
HCT: 47 % (ref 39.0–52.0)
HCT: 46 % (ref 39.0–52.0)
HCT: 35 % — ABNORMAL LOW (ref 39.0–52.0)
HCT: 36 % — ABNORMAL LOW (ref 39.0–52.0)
HCT: 31 % — ABNORMAL LOW (ref 39.0–52.0)
HCT: 36 % — ABNORMAL LOW (ref 39.0–52.0)
HCT: 24 % — ABNORMAL LOW (ref 39.0–52.0)
Hemoglobin: 16 g/dL (ref 13.0–17.0)
Hemoglobin: 15.6 g/dL (ref 13.0–17.0)
Hemoglobin: 11.9 g/dL — ABNORMAL LOW (ref 13.0–17.0)
Hemoglobin: 12.2 g/dL — ABNORMAL LOW (ref 13.0–17.0)
Hemoglobin: 10.5 g/dL — ABNORMAL LOW (ref 13.0–17.0)
Hemoglobin: 12.2 g/dL — ABNORMAL LOW (ref 13.0–17.0)
Hemoglobin: 8.2 g/dL — ABNORMAL LOW (ref 13.0–17.0)
Potassium: 3.4 mmol/L — ABNORMAL LOW (ref 3.5–5.1)
Potassium: 3.5 mmol/L (ref 3.5–5.1)
Potassium: 2.8 mmol/L — ABNORMAL LOW (ref 3.5–5.1)
Potassium: 2.8 mmol/L — ABNORMAL LOW (ref 3.5–5.1)
Potassium: 3.3 mmol/L — ABNORMAL LOW (ref 3.5–5.1)
Potassium: 3.4 mmol/L — ABNORMAL LOW (ref 3.5–5.1)
Potassium: 3 mmol/L — ABNORMAL LOW (ref 3.5–5.1)
Sodium: 132 mmol/L — ABNORMAL LOW (ref 135–145)
Sodium: 133 mmol/L — ABNORMAL LOW (ref 135–145)
Sodium: 130 mmol/L — ABNORMAL LOW (ref 135–145)
Sodium: 131 mmol/L — ABNORMAL LOW (ref 135–145)
Sodium: 133 mmol/L — ABNORMAL LOW (ref 135–145)
Sodium: 135 mmol/L (ref 135–145)
Sodium: 137 mmol/L (ref 135–145)
TCO2: 31 mmol/L (ref 0–100)
TCO2: 29 mmol/L (ref 0–100)
TCO2: 27 mmol/L (ref 0–100)
TCO2: 29 mmol/L (ref 0–100)
TCO2: 31 mmol/L (ref 0–100)
TCO2: 28 mmol/L (ref 0–100)
TCO2: 25 mmol/L (ref 0–100)

## 2015-05-10 LAB — POCT I-STAT 3, ART BLOOD GAS (G3+)
Acid-Base Excess: 5 mmol/L — ABNORMAL HIGH (ref 0.0–2.0)
Acid-Base Excess: 7 mmol/L — ABNORMAL HIGH (ref 0.0–2.0)
Acid-Base Excess: 3 mmol/L — ABNORMAL HIGH (ref 0.0–2.0)
Acid-base deficit: 1 mmol/L (ref 0.0–2.0)
Bicarbonate: 28.2 mEq/L — ABNORMAL HIGH (ref 20.0–24.0)
Bicarbonate: 30.9 mEq/L — ABNORMAL HIGH (ref 20.0–24.0)
Bicarbonate: 28.1 mEq/L — ABNORMAL HIGH (ref 20.0–24.0)
Bicarbonate: 24.1 mEq/L — ABNORMAL HIGH (ref 20.0–24.0)
O2 Saturation: 100 %
O2 Saturation: 100 %
O2 Saturation: 99 %
O2 Saturation: 100 %
Patient temperature: 37.4
Patient temperature: 36.5
TCO2: 29 mmol/L (ref 0–100)
TCO2: 32 mmol/L (ref 0–100)
TCO2: 29 mmol/L (ref 0–100)
TCO2: 25 mmol/L (ref 0–100)
pCO2 arterial: 34.7 mmHg — ABNORMAL LOW (ref 35.0–45.0)
pCO2 arterial: 39 mmHg (ref 35.0–45.0)
pCO2 arterial: 43.6 mmHg (ref 35.0–45.0)
pCO2 arterial: 37.8 mmHg (ref 35.0–45.0)
pH, Arterial: 7.518 — ABNORMAL HIGH (ref 7.350–7.450)
pH, Arterial: 7.507 — ABNORMAL HIGH (ref 7.350–7.450)
pH, Arterial: 7.418 (ref 7.350–7.450)
pH, Arterial: 7.41 (ref 7.350–7.450)
pO2, Arterial: 260 mmHg — ABNORMAL HIGH (ref 80.0–100.0)
pO2, Arterial: 377 mmHg — ABNORMAL HIGH (ref 80.0–100.0)
pO2, Arterial: 121 mmHg — ABNORMAL HIGH (ref 80.0–100.0)
pO2, Arterial: 207 mmHg — ABNORMAL HIGH (ref 80.0–100.0)

## 2015-05-10 LAB — BASIC METABOLIC PANEL
Anion gap: 12 (ref 5–15)
Anion gap: 10 (ref 5–15)
BUN: 20 mg/dL (ref 6–20)
BUN: 13 mg/dL (ref 6–20)
CO2: 31 mmol/L (ref 22–32)
CO2: 27 mmol/L (ref 22–32)
Calcium: 8.4 mg/dL — ABNORMAL LOW (ref 8.9–10.3)
Calcium: 7.7 mg/dL — ABNORMAL LOW (ref 8.9–10.3)
Chloride: 84 mmol/L — ABNORMAL LOW (ref 101–111)
Chloride: 100 mmol/L — ABNORMAL LOW (ref 101–111)
Creatinine, Ser: 1.04 mg/dL (ref 0.61–1.24)
Creatinine, Ser: 0.91 mg/dL (ref 0.61–1.24)
GFR calc Af Amer: >60 mL/min (ref >60)
GFR calc Af Amer: >60 mL/min (ref >60)
GFR calc non Af Amer: >60 mL/min (ref >60)
GFR calc non Af Amer: >60 mL/min (ref >60)
Glucose, Bld: 216 mg/dL — ABNORMAL HIGH (ref 65–99)
Glucose, Bld: 120 mg/dL — ABNORMAL HIGH (ref 65–99)
Potassium: 3.6 mmol/L (ref 3.5–5.1)
Potassium: 3.5 mmol/L (ref 3.5–5.1)
Sodium: 127 mmol/L — ABNORMAL LOW (ref 135–145)
Sodium: 137 mmol/L (ref 135–145)

## 2015-05-10 LAB — GLUCOSE, CAPILLARY
Glucose-Capillary: 115 mg/dL — ABNORMAL HIGH (ref 65–99)
Glucose-Capillary: 110 mg/dL — ABNORMAL HIGH (ref 65–99)
Glucose-Capillary: 117 mg/dL — ABNORMAL HIGH (ref 65–99)
Glucose-Capillary: 115 mg/dL — ABNORMAL HIGH (ref 65–99)
Glucose-Capillary: 97 mg/dL (ref 65–99)

## 2015-05-10 LAB — APTT
aPTT: 67 seconds — ABNORMAL HIGH (ref 24–37)
aPTT: 42 seconds — ABNORMAL HIGH (ref 24–37)

## 2015-05-10 LAB — CREATININE, SERUM
Creatinine, Ser: 0.92 mg/dL (ref 0.61–1.24)
GFR calc Af Amer: >60 mL/min (ref >60)
GFR calc non Af Amer: >60 mL/min (ref >60)

## 2015-05-10 LAB — MAGNESIUM
Magnesium: 1.8 mg/dL (ref 1.7–2.4)
Magnesium: 2.9 mg/dL — ABNORMAL HIGH (ref 1.7–2.4)

## 2015-05-10 LAB — CARBOXYHEMOGLOBIN
Carboxyhemoglobin: 1.9 % — ABNORMAL HIGH (ref 0.5–1.5)
Carboxyhemoglobin: 2.1 % — ABNORMAL HIGH (ref 0.5–1.5)
Carboxyhemoglobin: 1.1 % (ref 0.5–1.5)
Methemoglobin: 0.5 % (ref 0.0–1.5)
Methemoglobin: 0.7 % (ref 0.0–1.5)
Methemoglobin: 1.4 % (ref 0.0–1.5)
O2 Saturation: 54.5 %
O2 Saturation: 86 %
O2 Saturation: 59.5 %
Total hemoglobin: 13.7 g/dL (ref 13.5–18.0)
Total hemoglobin: 14.2 g/dL (ref 13.5–18.0)
Total hemoglobin: 11 g/dL — ABNORMAL LOW (ref 13.5–18.0)

## 2015-05-10 LAB — PREPARE RBC (CROSSMATCH)

## 2015-05-10 LAB — FIBRINOGEN: Fibrinogen: 289 mg/dL (ref 204–475)

## 2015-05-10 LAB — CBC
HCT: 40.4 % (ref 39.0–52.0)
HCT: 32.6 % — ABNORMAL LOW (ref 39.0–52.0)
HCT: 22.8 % — ABNORMAL LOW (ref 39.0–52.0)
Hemoglobin: 14.1 g/dL (ref 13.0–17.0)
Hemoglobin: 11.2 g/dL — ABNORMAL LOW (ref 13.0–17.0)
Hemoglobin: 8 g/dL — ABNORMAL LOW (ref 13.0–17.0)
MCH: 32.6 pg (ref 26.0–34.0)
MCH: 32.2 pg (ref 26.0–34.0)
MCH: 33.2 pg (ref 26.0–34.0)
MCHC: 34.9 g/dL (ref 30.0–36.0)
MCHC: 34.4 g/dL (ref 30.0–36.0)
MCHC: 35.1 g/dL (ref 30.0–36.0)
MCV: 93.3 fL (ref 78.0–100.0)
MCV: 93.7 fL (ref 78.0–100.0)
MCV: 94.6 fL (ref 78.0–100.0)
Platelets: 154 10*3/uL (ref 150–400)
Platelets: 79 10*3/uL — ABNORMAL LOW (ref 150–400)
Platelets: 99 10*3/uL — ABNORMAL LOW (ref 150–400)
RBC: 4.33 MIL/uL (ref 4.22–5.81)
RBC: 3.48 MIL/uL — ABNORMAL LOW (ref 4.22–5.81)
RBC: 2.41 MIL/uL — ABNORMAL LOW (ref 4.22–5.81)
RDW: 14.5 % (ref 11.5–15.5)
RDW: 14.7 % (ref 11.5–15.5)
RDW: 14.8 % (ref 11.5–15.5)
WBC: 6.3 10*3/uL (ref 4.0–10.5)
WBC: 6.4 10*3/uL (ref 4.0–10.5)
WBC: 5.4 10*3/uL (ref 4.0–10.5)

## 2015-05-10 LAB — DIC (DISSEMINATED INTRAVASCULAR COAGULATION)PANEL
D-Dimer, Quant: 2.03 ug/mL-FEU — ABNORMAL HIGH (ref 0.00–0.50)
Fibrinogen: 302 mg/dL (ref 204–475)
INR: 1.33 (ref 0.00–1.49)
Platelets: 81 10*3/uL — ABNORMAL LOW (ref 150–400)
Prothrombin Time: 16.6 seconds — ABNORMAL HIGH (ref 11.6–15.2)
Smear Review: NONE SEEN
aPTT: 43 seconds — ABNORMAL HIGH (ref 24–37)

## 2015-05-10 LAB — PROTIME-INR
INR: 1.19 (ref 0.00–1.49)
INR: 1.35 (ref 0.00–1.49)
Prothrombin Time: 15.2 seconds (ref 11.6–15.2)
Prothrombin Time: 16.8 seconds — ABNORMAL HIGH (ref 11.6–15.2)

## 2015-05-10 LAB — HEPARIN LEVEL (UNFRACTIONATED): Heparin Unfractionated: 0.26 IU/mL — ABNORMAL LOW (ref 0.30–0.70)

## 2015-05-10 LAB — HEMOGLOBIN AND HEMATOCRIT, BLOOD
HCT: 30.8 % — ABNORMAL LOW (ref 39.0–52.0)
Hemoglobin: 10.8 g/dL — ABNORMAL LOW (ref 13.0–17.0)

## 2015-05-10 LAB — POCT ACTIVATED CLOTTING TIME: Activated Clotting Time: 137 seconds

## 2015-05-10 LAB — PLATELET COUNT: Platelets: 91 10*3/uL — ABNORMAL LOW (ref 150–400)

## 2015-05-10 SURGERY — INSERTION OF IMPLANTABLE LEFT VENTRICULAR ASSIST DEVICE
Anesthesia: General | Site: Chest

## 2015-05-10 MED ORDER — SODIUM CHLORIDE 0.9 % IJ SOLN
INTRAMUSCULAR | Status: AC
  Filled 2015-05-10: qty 20

## 2015-05-10 MED ORDER — ANTISEPTIC ORAL RINSE SOLUTION (CORINZ)
7.0000 mL | Freq: Four times a day (QID) | OROMUCOSAL | Status: DC
  Administered 2015-05-11 (×4): 7 mL via OROMUCOSAL

## 2015-05-10 MED ORDER — POTASSIUM CHLORIDE 10 MEQ/50ML IV SOLN
10.0000 meq | INTRAVENOUS | Status: AC
Start: 2015-05-10 — End: 2015-05-11
  Administered 2015-05-10 (×2): 10 meq via INTRAVENOUS
  Filled 2015-05-10: qty 50

## 2015-05-10 MED ORDER — ETOMIDATE 2 MG/ML IV SOLN
INTRAVENOUS | Status: DC | PRN
  Administered 2015-05-10: 20 mg via INTRAVENOUS

## 2015-05-10 MED ORDER — SODIUM CHLORIDE 0.9 % IJ SOLN
OROMUCOSAL | Status: DC | PRN
  Administered 2015-05-10 (×3): 4 mL via TOPICAL

## 2015-05-10 MED ORDER — ASPIRIN EC 325 MG PO TBEC
325.0000 mg | DELAYED_RELEASE_TABLET | Freq: Every day | ORAL | Status: DC
  Administered 2015-05-11 – 2015-05-14 (×4): 325 mg via ORAL
  Filled 2015-05-10 (×4): qty 1

## 2015-05-10 MED ORDER — MIDAZOLAM HCL 10 MG/2ML IJ SOLN
INTRAMUSCULAR | Status: AC
  Filled 2015-05-10: qty 2

## 2015-05-10 MED ORDER — RIFAMPIN 300 MG PO CAPS
600.0000 mg | ORAL_CAPSULE | Freq: Once | ORAL | Status: DC
  Filled 2015-05-10: qty 2

## 2015-05-10 MED ORDER — SODIUM CHLORIDE 0.9 % IV SOLN: INTRAVENOUS | Status: DC

## 2015-05-10 MED ORDER — HEMOSTATIC AGENTS (NO CHARGE) OPTIME
TOPICAL | Status: DC | PRN
  Administered 2015-05-10 (×2): 1 via TOPICAL

## 2015-05-10 MED ORDER — ASPIRIN 300 MG RE SUPP
300.0000 mg | Freq: Every day | RECTAL | Status: DC
  Filled 2015-05-10 (×5): qty 1

## 2015-05-10 MED ORDER — CHLORHEXIDINE GLUCONATE 0.12 % MT SOLN
15.0000 mL | OROMUCOSAL | Status: AC
  Administered 2015-05-10: 15 mL via OROMUCOSAL

## 2015-05-10 MED ORDER — DEXMEDETOMIDINE HCL IN NACL 200 MCG/50ML IV SOLN
0.1000 ug/kg/h | INTRAVENOUS | Status: DC
  Administered 2015-05-10 – 2015-05-11 (×4): 0.7 ug/kg/h via INTRAVENOUS
  Filled 2015-05-10 (×4): qty 50

## 2015-05-10 MED ORDER — BISACODYL 5 MG PO TBEC
10.0000 mg | DELAYED_RELEASE_TABLET | Freq: Every day | ORAL | Status: DC
  Administered 2015-05-11 – 2015-05-30 (×10): 10 mg via ORAL
  Filled 2015-05-10 (×11): qty 2

## 2015-05-10 MED ORDER — POTASSIUM CHLORIDE 10 MEQ/100ML IV SOLN
INTRAVENOUS | Status: DC | PRN
  Administered 2015-05-10 (×2): 10 meq via INTRAVENOUS

## 2015-05-10 MED ORDER — SODIUM CHLORIDE 0.9% FLUSH: 3.0000 mL | INTRAVENOUS | Status: DC | PRN

## 2015-05-10 MED ORDER — VANCOMYCIN HCL 1000 MG IV SOLR
INTRAVENOUS | Status: DC | PRN
  Administered 2015-05-10 (×2): 1000 mg

## 2015-05-10 MED ORDER — LACTATED RINGERS IV SOLN
INTRAVENOUS | Status: DC | PRN
  Administered 2015-05-10: 08:00:00 via INTRAVENOUS

## 2015-05-10 MED ORDER — MORPHINE SULFATE (PF) 2 MG/ML IV SOLN
2.0000 mg | INTRAVENOUS | Status: DC | PRN
  Administered 2015-05-10 – 2015-05-11 (×2): 4 mg via INTRAVENOUS
  Administered 2015-05-12 – 2015-05-13 (×5): 2 mg via INTRAVENOUS
  Filled 2015-05-10 (×5): qty 1

## 2015-05-10 MED ORDER — FENTANYL CITRATE (PF) 100 MCG/2ML IJ SOLN
INTRAMUSCULAR | Status: DC | PRN
  Administered 2015-05-10 (×3): 100 ug via INTRAVENOUS
  Administered 2015-05-10: 25 ug via INTRAVENOUS
  Administered 2015-05-10 (×3): 100 ug via INTRAVENOUS
  Administered 2015-05-10: 225 ug via INTRAVENOUS

## 2015-05-10 MED ORDER — ACETAMINOPHEN 160 MG/5ML PO SOLN: 650.0000 mg | Freq: Once | ORAL | Status: AC

## 2015-05-10 MED ORDER — HEPARIN SODIUM (PORCINE) 1000 UNIT/ML IJ SOLN
INTRAMUSCULAR | Status: AC
  Filled 2015-05-10: qty 1

## 2015-05-10 MED ORDER — CHLORHEXIDINE GLUCONATE 0.12% ORAL RINSE (MEDLINE KIT)
15.0000 mL | Freq: Two times a day (BID) | OROMUCOSAL | Status: DC
  Administered 2015-05-10 – 2015-05-11 (×3): 15 mL via OROMUCOSAL

## 2015-05-10 MED ORDER — ROCURONIUM BROMIDE 100 MG/10ML IV SOLN
INTRAVENOUS | Status: DC | PRN
  Administered 2015-05-10: 100 mg via INTRAVENOUS
  Administered 2015-05-10 (×2): 50 mg via INTRAVENOUS

## 2015-05-10 MED ORDER — VANCOMYCIN HCL IN DEXTROSE 1-5 GM/200ML-% IV SOLN
1500.0000 mg | Freq: Two times a day (BID) | INTRAVENOUS | Status: DC
  Filled 2015-05-10 (×3): qty 400

## 2015-05-10 MED ORDER — SODIUM CHLORIDE 0.9 % IV SOLN
INTRAVENOUS | Status: DC
  Filled 2015-05-10 (×2): qty 2.5

## 2015-05-10 MED ORDER — MIDAZOLAM HCL 5 MG/5ML IJ SOLN
INTRAMUSCULAR | Status: DC | PRN
  Administered 2015-05-10: 3 mg via INTRAVENOUS
  Administered 2015-05-10: 2 mg via INTRAVENOUS
  Administered 2015-05-10: 3 mg via INTRAVENOUS
  Administered 2015-05-10: 4 mg via INTRAVENOUS
  Administered 2015-05-10: 2 mg via INTRAVENOUS
  Administered 2015-05-10 (×2): 1 mg via INTRAVENOUS
  Administered 2015-05-10 (×2): 2 mg via INTRAVENOUS

## 2015-05-10 MED ORDER — EPHEDRINE SULFATE 50 MG/ML IJ SOLN
INTRAMUSCULAR | Status: AC
  Filled 2015-05-10: qty 1

## 2015-05-10 MED ORDER — ACETAMINOPHEN 650 MG RE SUPP
650.0000 mg | Freq: Once | RECTAL | Status: AC
  Administered 2015-05-10: 650 mg via RECTAL

## 2015-05-10 MED ORDER — ACETAMINOPHEN 500 MG PO TABS
1000.0000 mg | ORAL_TABLET | Freq: Four times a day (QID) | ORAL | Status: DC
  Administered 2015-05-11 – 2015-05-14 (×7): 1000 mg via ORAL
  Filled 2015-05-10 (×7): qty 2

## 2015-05-10 MED ORDER — MILRINONE IN DEXTROSE 20 MG/100ML IV SOLN
0.3750 ug/kg/min | INTRAVENOUS | Status: DC
  Administered 2015-05-10 – 2015-05-12 (×4): 0.375 ug/kg/min via INTRAVENOUS
  Filled 2015-05-10 (×4): qty 100

## 2015-05-10 MED ORDER — LEVOFLOXACIN IN D5W 750 MG/150ML IV SOLN
750.0000 mg | Freq: Once | INTRAVENOUS | Status: AC
  Administered 2015-05-11: 750 mg via INTRAVENOUS
  Filled 2015-05-10: qty 150

## 2015-05-10 MED ORDER — METOCLOPRAMIDE HCL 5 MG/ML IJ SOLN
10.0000 mg | Freq: Four times a day (QID) | INTRAMUSCULAR | Status: DC
  Administered 2015-05-10 – 2015-05-14 (×14): 10 mg via INTRAVENOUS
  Filled 2015-05-10 (×14): qty 2

## 2015-05-10 MED ORDER — TRAMADOL HCL 50 MG PO TABS
50.0000 mg | ORAL_TABLET | ORAL | Status: DC | PRN
  Administered 2015-05-11: 50 mg via ORAL
  Administered 2015-05-12: 100 mg via ORAL
  Administered 2015-05-13 (×2): 50 mg via ORAL
  Administered 2015-05-14 – 2015-05-23 (×11): 100 mg via ORAL
  Filled 2015-05-10: qty 2
  Filled 2015-05-10: qty 1
  Filled 2015-05-10 (×8): qty 2
  Filled 2015-05-10: qty 1
  Filled 2015-05-10: qty 2
  Filled 2015-05-10: qty 1
  Filled 2015-05-10 (×2): qty 2

## 2015-05-10 MED ORDER — FENTANYL CITRATE (PF) 250 MCG/5ML IJ SOLN
INTRAMUSCULAR | Status: AC
  Filled 2015-05-10: qty 25

## 2015-05-10 MED ORDER — LACTATED RINGERS IV SOLN: 500.0000 mL | Freq: Once | INTRAVENOUS | Status: DC | PRN

## 2015-05-10 MED ORDER — HEPARIN SODIUM (PORCINE) 5000 UNIT/ML IJ SOLN
INTRAMUSCULAR | Status: DC | PRN
  Administered 2015-05-10: 500 mL

## 2015-05-10 MED ORDER — PHENYLEPHRINE HCL 10 MG/ML IJ SOLN
0.0000 ug/min | INTRAMUSCULAR | Status: DC
  Filled 2015-05-10: qty 2

## 2015-05-10 MED ORDER — FLUCONAZOLE IN SODIUM CHLORIDE 400-0.9 MG/200ML-% IV SOLN
400.0000 mg | Freq: Once | INTRAVENOUS | Status: AC
  Administered 2015-05-11: 400 mg via INTRAVENOUS
  Filled 2015-05-10: qty 200

## 2015-05-10 MED ORDER — INSULIN REGULAR BOLUS VIA INFUSION
0.0000 [IU] | Freq: Three times a day (TID) | INTRAVENOUS | Status: DC
  Filled 2015-05-10: qty 10

## 2015-05-10 MED ORDER — SODIUM CHLORIDE 0.9 % IV SOLN: 10.0000 mL/h | Freq: Once | INTRAVENOUS | Status: DC

## 2015-05-10 MED ORDER — LACTATED RINGERS IV SOLN
INTRAVENOUS | Status: DC
  Administered 2015-05-12: 10 mL via INTRAVENOUS
  Administered 2015-05-15: 20 mL/h via INTRAVENOUS

## 2015-05-10 MED ORDER — ONDANSETRON HCL 4 MG/2ML IJ SOLN
4.0000 mg | Freq: Four times a day (QID) | INTRAMUSCULAR | Status: DC | PRN
  Administered 2015-05-14 – 2015-05-22 (×4): 4 mg via INTRAVENOUS
  Filled 2015-05-10 (×4): qty 2

## 2015-05-10 MED ORDER — SODIUM CHLORIDE 0.9 % IV SOLN
250.0000 mL | INTRAVENOUS | Status: DC
  Administered 2015-05-11: 250 mL via INTRAVENOUS

## 2015-05-10 MED ORDER — ARTIFICIAL TEARS OP OINT
TOPICAL_OINTMENT | OPHTHALMIC | Status: DC | PRN
  Administered 2015-05-10: 1 via OPHTHALMIC

## 2015-05-10 MED ORDER — ROCURONIUM BROMIDE 50 MG/5ML IV SOLN
INTRAVENOUS | Status: AC
  Filled 2015-05-10: qty 1

## 2015-05-10 MED ORDER — OXYCODONE HCL 5 MG PO TABS
5.0000 mg | ORAL_TABLET | ORAL | Status: DC | PRN
  Administered 2015-05-11: 5 mg via ORAL
  Administered 2015-05-12 – 2015-05-18 (×8): 10 mg via ORAL
  Administered 2015-05-23 – 2015-05-24 (×2): 5 mg via ORAL
  Filled 2015-05-10: qty 2
  Filled 2015-05-10: qty 1
  Filled 2015-05-10 (×3): qty 2
  Filled 2015-05-10: qty 1
  Filled 2015-05-10: qty 2
  Filled 2015-05-10: qty 1
  Filled 2015-05-10 (×4): qty 2

## 2015-05-10 MED ORDER — POTASSIUM CHLORIDE 10 MEQ/50ML IV SOLN
10.0000 meq | INTRAVENOUS | Status: DC
  Filled 2015-05-10 (×3): qty 50

## 2015-05-10 MED ORDER — SODIUM CHLORIDE 0.9 % IV SOLN
20.0000 ug | INTRAVENOUS | Status: DC | PRN
  Administered 2015-05-10: 20 ug via INTRAVENOUS

## 2015-05-10 MED ORDER — HEPARIN SODIUM (PORCINE) 1000 UNIT/ML IJ SOLN
INTRAMUSCULAR | Status: DC | PRN
  Administered 2015-05-10: 5000 [IU] via INTRAVENOUS
  Administered 2015-05-10: 30000 [IU] via INTRAVENOUS
  Administered 2015-05-10: 10000 [IU] via INTRAVENOUS

## 2015-05-10 MED ORDER — MORPHINE SULFATE (PF) 2 MG/ML IV SOLN
1.0000 mg | INTRAVENOUS | Status: AC | PRN
  Administered 2015-05-10 – 2015-05-11 (×2): 4 mg via INTRAVENOUS
  Filled 2015-05-10 (×5): qty 2

## 2015-05-10 MED ORDER — ALBUMIN HUMAN 5 % IV SOLN
250.0000 mL | INTRAVENOUS | Status: AC | PRN
  Administered 2015-05-10 (×4): 250 mL via INTRAVENOUS
  Filled 2015-05-10 (×2): qty 250

## 2015-05-10 MED ORDER — NOREPINEPHRINE BITARTRATE 1 MG/ML IV SOLN
1.0000 ug/min | INTRAVENOUS | Status: DC
  Administered 2015-05-10: 3 ug/min via INTRAVENOUS
  Administered 2015-05-11: 4 ug/min via INTRAVENOUS
  Filled 2015-05-10 (×2): qty 4

## 2015-05-10 MED ORDER — SODIUM CHLORIDE 0.45 % IV SOLN
INTRAVENOUS | Status: DC | PRN
  Administered 2015-05-10: 20 mL via INTRAVENOUS

## 2015-05-10 MED ORDER — INSULIN ASPART 100 UNIT/ML ~~LOC~~ SOLN
0.0000 [IU] | SUBCUTANEOUS | Status: DC
Start: 2015-05-10 — End: 2015-05-13

## 2015-05-10 MED ORDER — MIDAZOLAM HCL 2 MG/2ML IJ SOLN
2.0000 mg | INTRAMUSCULAR | Status: DC | PRN
Start: 2015-05-10 — End: 2015-05-11
  Administered 2015-05-10 – 2015-05-11 (×3): 2 mg via INTRAVENOUS
  Filled 2015-05-10 (×5): qty 2

## 2015-05-10 MED ORDER — ARTIFICIAL TEARS OP OINT
TOPICAL_OINTMENT | OPHTHALMIC | Status: AC
  Filled 2015-05-10: qty 3.5

## 2015-05-10 MED ORDER — ASPIRIN 81 MG PO CHEW: 324.0000 mg | CHEWABLE_TABLET | Freq: Every day | ORAL | Status: DC

## 2015-05-10 MED ORDER — LACTATED RINGERS IV SOLN: INTRAVENOUS | Status: DC

## 2015-05-10 MED ORDER — DOCUSATE SODIUM 100 MG PO CAPS
200.0000 mg | ORAL_CAPSULE | Freq: Every day | ORAL | Status: DC
  Administered 2015-05-12 – 2015-05-30 (×10): 200 mg via ORAL
  Filled 2015-05-10 (×12): qty 2

## 2015-05-10 MED ORDER — FAMOTIDINE IN NACL 20-0.9 MG/50ML-% IV SOLN
20.0000 mg | Freq: Two times a day (BID) | INTRAVENOUS | Status: AC
  Administered 2015-05-10 (×2): 20 mg via INTRAVENOUS
  Filled 2015-05-10: qty 50

## 2015-05-10 MED ORDER — LACTATED RINGERS IV SOLN
INTRAVENOUS | Status: DC | PRN
  Administered 2015-05-10 (×2): via INTRAVENOUS

## 2015-05-10 MED ORDER — MAGNESIUM SULFATE 4 GM/100ML IV SOLN
4.0000 g | Freq: Once | INTRAVENOUS | Status: AC
  Administered 2015-05-10: 4 g via INTRAVENOUS
  Filled 2015-05-10: qty 100

## 2015-05-10 MED ORDER — POTASSIUM CHLORIDE 10 MEQ/50ML IV SOLN
10.0000 meq | INTRAVENOUS | Status: AC
  Administered 2015-05-10 (×4): 10 meq via INTRAVENOUS

## 2015-05-10 MED ORDER — ALBUMIN HUMAN 5 % IV SOLN
INTRAVENOUS | Status: DC | PRN
  Administered 2015-05-10: 12:00:00 via INTRAVENOUS

## 2015-05-10 MED ORDER — VANCOMYCIN HCL 1000 MG IV SOLR
INTRAVENOUS | Status: AC
Start: 2015-05-10 — End: 2015-05-10
  Filled 2015-05-10: qty 2000

## 2015-05-10 MED ORDER — PROTAMINE SULFATE 10 MG/ML IV SOLN
INTRAVENOUS | Status: DC | PRN
  Administered 2015-05-10: 200 mg via INTRAVENOUS
  Administered 2015-05-10: 10 mg via INTRAVENOUS
  Administered 2015-05-10: 40 mg via INTRAVENOUS

## 2015-05-10 MED ORDER — PANTOPRAZOLE SODIUM 40 MG PO TBEC
40.0000 mg | DELAYED_RELEASE_TABLET | Freq: Every day | ORAL | Status: DC
  Administered 2015-05-12 – 2015-05-16 (×5): 40 mg via ORAL
  Filled 2015-05-10 (×5): qty 1

## 2015-05-10 MED ORDER — LACTATED RINGERS IV SOLN
INTRAVENOUS | Status: DC | PRN
Start: 2015-05-10 — End: 2015-05-10
  Administered 2015-05-10: 08:00:00 via INTRAVENOUS

## 2015-05-10 MED ORDER — 0.9 % SODIUM CHLORIDE (POUR BTL) OPTIME
TOPICAL | Status: DC | PRN
  Administered 2015-05-10: 6000 mL

## 2015-05-10 MED ORDER — SODIUM CHLORIDE 0.9 % IV SOLN
20.0000 ug | INTRAVENOUS | Status: DC
  Filled 2015-05-10: qty 5

## 2015-05-10 MED ORDER — SODIUM CHLORIDE 0.9% FLUSH: 3.0000 mL | Freq: Two times a day (BID) | INTRAVENOUS | Status: DC

## 2015-05-10 MED ORDER — LIDOCAINE HCL (CARDIAC) 20 MG/ML IV SOLN
INTRAVENOUS | Status: DC | PRN
  Administered 2015-05-10: 40 mg via INTRAVENOUS

## 2015-05-10 MED ORDER — NITROGLYCERIN IN D5W 200-5 MCG/ML-% IV SOLN: 0.0000 ug/min | INTRAVENOUS | Status: DC

## 2015-05-10 MED ORDER — EPINEPHRINE HCL 1 MG/ML IJ SOLN
4.0000 ug/min | INTRAVENOUS | Status: DC
  Administered 2015-05-11: 4 ug/min via INTRAVENOUS
  Administered 2015-05-11: 3.5 ug/min via INTRAVENOUS
  Filled 2015-05-10 (×2): qty 4

## 2015-05-10 MED ORDER — BISACODYL 10 MG RE SUPP: 10.0000 mg | Freq: Every day | RECTAL | Status: DC

## 2015-05-10 MED ORDER — ROCURONIUM BROMIDE 50 MG/5ML IV SOLN
INTRAVENOUS | Status: AC
  Filled 2015-05-10: qty 5

## 2015-05-10 MED ORDER — ACETAMINOPHEN 160 MG/5ML PO SOLN
1000.0000 mg | Freq: Four times a day (QID) | ORAL | Status: DC
  Administered 2015-05-11 (×2): 1000 mg
  Filled 2015-05-10 (×2): qty 40.6

## 2015-05-10 MED ORDER — SODIUM CHLORIDE 0.9 % IV SOLN
Freq: Once | INTRAVENOUS | Status: AC
  Administered 2015-05-10: 22:00:00 via INTRAVENOUS

## 2015-05-10 MED ORDER — POTASSIUM CHLORIDE 10 MEQ/50ML IV SOLN
10.0000 meq | INTRAVENOUS | Status: AC
  Administered 2015-05-10 (×3): 10 meq via INTRAVENOUS
  Filled 2015-05-10: qty 50

## 2015-05-10 MED FILL — Sodium Bicarbonate IV Soln 8.4%: INTRAVENOUS | Qty: 50 | Status: AC

## 2015-05-10 MED FILL — Sodium Chloride IV Soln 0.9%: INTRAVENOUS | Qty: 2000 | Status: AC

## 2015-05-10 MED FILL — Lidocaine HCl IV Inj 20 MG/ML: INTRAVENOUS | Qty: 5 | Status: AC

## 2015-05-10 MED FILL — Heparin Sodium (Porcine) Inj 1000 Unit/ML: INTRAMUSCULAR | Qty: 10 | Status: AC

## 2015-05-10 MED FILL — Mannitol IV Soln 20%: INTRAVENOUS | Qty: 500 | Status: AC

## 2015-05-10 MED FILL — Electrolyte-R (PH 7.4) Solution: INTRAVENOUS | Qty: 4000 | Status: AC

## 2015-05-10 SURGICAL SUPPLY — 130 items
ADAPTER CARDIO PERF ANTE/RETRO (ADAPTER) IMPLANT
ADAPTER DLP PERFUSION .25INX2I (MISCELLANEOUS) ×4 IMPLANT
ADH SKN CLS APL DERMABOND .7 (GAUZE/BANDAGES/DRESSINGS) ×2
ADPR CRDPLG .25X.64 STRL (MISCELLANEOUS) ×2
ADPR PRFSN 84XANTGRD RTRGD (ADAPTER)
ANTEGRADE CPLG (MISCELLANEOUS) IMPLANT
ATTRACTOMAT 16X20 MAGNETIC DRP (DRAPES) ×4 IMPLANT
BAG DECANTER FOR FLEXI CONT (MISCELLANEOUS) ×12 IMPLANT
BLADE STERNUM SYSTEM 6 (BLADE) ×4 IMPLANT
BLADE SURG 12 STRL SS (BLADE) ×4 IMPLANT
BLADE SURG 15 STRL LF DISP TIS (BLADE) IMPLANT
BLADE SURG 15 STRL SS (BLADE)
CANISTER SUCTION 2500CC (MISCELLANEOUS) ×4 IMPLANT
CANNULA ARTERIAL NVNT 3/8 20FR (MISCELLANEOUS) ×4 IMPLANT
CANNULA VENOUS LOW PROF 34X46 (CANNULA) ×4 IMPLANT
CATH CPB KIT VANTRIGT (MISCELLANEOUS) IMPLANT
CATH FOLEY 2WAY SLVR  5CC 14FR (CATHETERS) ×2
CATH FOLEY 2WAY SLVR 5CC 14FR (CATHETERS) ×2 IMPLANT
CATH HEART VENT LEFT (CATHETERS) IMPLANT
CATH HYDRAGLIDE XL THORACIC (CATHETERS) ×4 IMPLANT
CATH ROBINSON RED A/P 18FR (CATHETERS) ×10 IMPLANT
CATH THORACIC 28FR (CATHETERS) ×2 IMPLANT
CATH THORACIC 36FR RT ANG (CATHETERS) ×2 IMPLANT
CHLORAPREP W/TINT 26ML (MISCELLANEOUS) ×4 IMPLANT
CONN ST 1/4X3/8  BEN (MISCELLANEOUS)
CONN ST 1/4X3/8 BEN (MISCELLANEOUS) IMPLANT
CONT SPEC 4OZ CLIKSEAL STRL BL (MISCELLANEOUS) ×4 IMPLANT
COVER BACK TABLE 60X90IN (DRAPES) ×2 IMPLANT
COVER SURGICAL LIGHT HANDLE (MISCELLANEOUS) ×4 IMPLANT
COVER TABLE BACK 60X90 (DRAPES) ×2 IMPLANT
CRADLE DONUT ADULT HEAD (MISCELLANEOUS) ×4 IMPLANT
DERMABOND ADVANCED (GAUZE/BANDAGES/DRESSINGS) ×2
DERMABOND ADVANCED .7 DNX12 (GAUZE/BANDAGES/DRESSINGS) IMPLANT
DRAIN CHANNEL 28F RND 3/8 FF (WOUND CARE) IMPLANT
DRAIN CHANNEL 32F RND 10.7 FF (WOUND CARE) ×4 IMPLANT
DRAPE BILATERAL SPLIT (DRAPES) ×4 IMPLANT
DRAPE CV SPLIT W-CLR ANES SCRN (DRAPES) ×4 IMPLANT
DRAPE INCISE IOBAN 66X45 STRL (DRAPES) ×4 IMPLANT
DRAPE SLUSH/WARMER DISC (DRAPES) ×4 IMPLANT
DRSG AQUACEL AG ADV 3.5X14 (GAUZE/BANDAGES/DRESSINGS) ×4 IMPLANT
ELECT BLADE 4.0 EZ CLEAN MEGAD (MISCELLANEOUS) ×4
ELECT BLADE 6.5 EXT (BLADE) ×4 IMPLANT
ELECT CAUTERY BLADE 6.4 (BLADE) ×4 IMPLANT
ELECT REM PT RETURN 9FT ADLT (ELECTROSURGICAL) ×4
ELECTRODE BLDE 4.0 EZ CLN MEGD (MISCELLANEOUS) ×2 IMPLANT
ELECTRODE REM PT RTRN 9FT ADLT (ELECTROSURGICAL) ×2 IMPLANT
FELT TEFLON 6X6 (MISCELLANEOUS) ×4 IMPLANT
GAUZE SPONGE 4X4 12PLY STRL (GAUZE/BANDAGES/DRESSINGS) ×8 IMPLANT
GLOVE BIO SURGEON ST LM GN SZ9 (GLOVE) ×2 IMPLANT
GLOVE BIO SURGEON STRL SZ 6.5 (GLOVE) ×1 IMPLANT
GLOVE BIO SURGEON STRL SZ7 (GLOVE) ×8 IMPLANT
GLOVE BIO SURGEON STRL SZ7.5 (GLOVE) ×14 IMPLANT
GLOVE BIO SURGEONS STRL SZ 6.5 (GLOVE) ×1
GLOVE BIOGEL PI IND STRL 6 (GLOVE) IMPLANT
GLOVE BIOGEL PI IND STRL 6.5 (GLOVE) IMPLANT
GLOVE BIOGEL PI IND STRL 7.0 (GLOVE) IMPLANT
GLOVE BIOGEL PI INDICATOR 6 (GLOVE) ×4
GLOVE BIOGEL PI INDICATOR 6.5 (GLOVE) ×8
GLOVE BIOGEL PI INDICATOR 7.0 (GLOVE) ×4
GOWN STRL REUS W/ TWL LRG LVL3 (GOWN DISPOSABLE) ×8 IMPLANT
GOWN STRL REUS W/ TWL XL LVL3 (GOWN DISPOSABLE) ×4 IMPLANT
GOWN STRL REUS W/TWL LRG LVL3 (GOWN DISPOSABLE) ×16
GOWN STRL REUS W/TWL XL LVL3 (GOWN DISPOSABLE) ×8
HEMOSTAT POWDER SURGIFOAM 1G (HEMOSTASIS) ×16 IMPLANT
HEMOSTAT SURGICEL 2X14 (HEMOSTASIS) IMPLANT
HM II VENTR. ASSIST DEVICE BP (Prosthesis & Implant Heart) ×4 IMPLANT
INSERT FOGARTY XLG (MISCELLANEOUS) IMPLANT
IV NS 1000ML (IV SOLUTION) ×12
IV NS 1000ML BAXH (IV SOLUTION) IMPLANT
KIT BASIN OR (CUSTOM PROCEDURE TRAY) ×4 IMPLANT
KIT ROOM TURNOVER OR (KITS) ×4 IMPLANT
KIT SUCTION CATH 14FR (SUCTIONS) ×4 IMPLANT
LEAD PACING MYOCARDI (MISCELLANEOUS) IMPLANT
LINE VENT (MISCELLANEOUS) ×2 IMPLANT
NDL SUT 1 .5 CRC FRENCH EYE (NEEDLE) IMPLANT
NEEDLE FRENCH EYE (NEEDLE) ×4
NS IRRIG 1000ML POUR BTL (IV SOLUTION) ×22 IMPLANT
PACK OPEN HEART (CUSTOM PROCEDURE TRAY) ×4 IMPLANT
PAD ARMBOARD 7.5X6 YLW CONV (MISCELLANEOUS) ×8 IMPLANT
PAD DEFIB R2 (MISCELLANEOUS) ×4 IMPLANT
PUNCH AORTIC ROTATE  4.5MM 8IN (MISCELLANEOUS) ×2 IMPLANT
PUNCH AORTIC ROTATE 4.5MM 8IN (MISCELLANEOUS) ×4 IMPLANT
SEALANT SURG COSEAL 8ML (VASCULAR PRODUCTS) ×4 IMPLANT
SET CARDIOPLEGIA MPS 5001102 (MISCELLANEOUS) ×2 IMPLANT
SHEATH AVANTI 11CM 5FR (MISCELLANEOUS) IMPLANT
SPONGE GAUZE 4X4 12PLY STER LF (GAUZE/BANDAGES/DRESSINGS) ×2 IMPLANT
SPONGE LAP 18X18 X RAY DECT (DISPOSABLE) ×2 IMPLANT
SPONGE LAP 4X18 X RAY DECT (DISPOSABLE) ×2 IMPLANT
STOPCOCK 4 WAY LG BORE MALE ST (IV SETS) ×4 IMPLANT
SUCKER INTRACARDIAC WEIGHTED (SUCKER) ×4 IMPLANT
SURGIFLO W/THROMBIN 8M KIT (HEMOSTASIS) ×2 IMPLANT
SUT ETHIBOND 2 0 SH (SUTURE) ×20
SUT ETHIBOND 2 0 SH 36X2 (SUTURE) ×10 IMPLANT
SUT ETHIBOND 5 LR DA (SUTURE) ×8 IMPLANT
SUT ETHIBOND NAB MH 2-0 36IN (SUTURE) ×52 IMPLANT
SUT ETHILON 3 0 FSL (SUTURE) ×2 IMPLANT
SUT PROLENE 3 0 RB 1 (SUTURE) IMPLANT
SUT PROLENE 3 0 SH DA (SUTURE) ×10 IMPLANT
SUT PROLENE 4 0 RB 1 (SUTURE) ×20
SUT PROLENE 4-0 RB1 .5 CRCL 36 (SUTURE) ×8 IMPLANT
SUT PROLENE 5 0 C1 (SUTURE) IMPLANT
SUT PROLENE 6 0 CC (SUTURE) ×2 IMPLANT
SUT SILK  1 MH (SUTURE) ×12
SUT SILK 1 MH (SUTURE) ×8 IMPLANT
SUT SILK 1 TIES 10X30 (SUTURE) ×4 IMPLANT
SUT SILK 2 0 SH CR/8 (SUTURE) ×8 IMPLANT
SUT SILK 2 0SH CR/8 30 (SUTURE) ×2 IMPLANT
SUT SILK 3 0 SH CR/8 (SUTURE) ×2 IMPLANT
SUT STEEL 6MS V (SUTURE) ×8 IMPLANT
SUT STEEL SZ 6 DBL 3X14 BALL (SUTURE) ×4 IMPLANT
SUT TEM PAC WIRE 2 0 SH (SUTURE) ×2 IMPLANT
SUT VIC AB 1 CTX 18 (SUTURE) ×2 IMPLANT
SUT VIC AB 1 CTX 36 (SUTURE) ×20
SUT VIC AB 1 CTX36XBRD ANBCTR (SUTURE) ×4 IMPLANT
SUT VIC AB 2-0 CTX 27 (SUTURE) ×8 IMPLANT
SUT VIC AB 3-0 SH 8-18 (SUTURE) ×4 IMPLANT
SUT VIC AB 3-0 X1 27 (SUTURE) ×12 IMPLANT
SUT VICRYL 2 TP 1 (SUTURE) IMPLANT
SYR 50ML LL SCALE MARK (SYRINGE) ×4 IMPLANT
SYSTEM SAHARA CHEST DRAIN ATS (WOUND CARE) ×6 IMPLANT
TOWEL OR 17X24 6PK STRL BLUE (TOWEL DISPOSABLE) ×8 IMPLANT
TOWEL OR 17X26 10 PK STRL BLUE (TOWEL DISPOSABLE) ×8 IMPLANT
TRAY CATH LUMEN 1 20CM STRL (SET/KITS/TRAYS/PACK) ×4 IMPLANT
TRAY FOLEY IC TEMP SENS 14FR (CATHETERS) ×4 IMPLANT
TUBE CONNECTING 12'X1/4 (SUCTIONS) ×1
TUBE CONNECTING 12X1/4 (SUCTIONS) ×3 IMPLANT
UNDERPAD 30X30 INCONTINENT (UNDERPADS AND DIAPERS) ×4 IMPLANT
VENT LEFT HEART 12002 (CATHETERS)
WATER STERILE IRR 1000ML POUR (IV SOLUTION) ×8 IMPLANT
YANKAUER SUCT BULB TIP NO VENT (SUCTIONS) ×4 IMPLANT

## 2015-05-10 NOTE — Progress Notes (Signed)
ANTICOAGULATION CONSULT NOTE Pharmacy Consult for heparin Indication: IABP  Patient Measurements: Height: 5\' 11"  (180.3 cm) Weight: 160 lb 4.4 oz (72.7 kg) IBW/kg (Calculated) : 75.3 Heparin Dosing Weight: 73kg  Vital Signs: Temp: 99 F (37.2 C) (01/31 0700) Temp Source: Core (Comment) (01/31 0400) BP: 133/83 mmHg (01/31 0700)  Labs:  Recent Labs  05/08/15 0539 05/08/15 2356 05/09/15 0500 05/09/15 1045 05/10/15 0200 05/10/15 0216  HGB  --   --  14.5  --  14.1  --   HCT  --   --  41.6  --  40.4  --   PLT  --   --  180  --  154  --   APTT  --   --   --   --  67*  --   LABPROT  --   --   --   --  15.2  --   INR  --   --   --   --  1.19  --   HEPARINUNFRC  --  0.31  --  0.17*  --  0.26*  CREATININE 1.12  --  1.28*  --  1.04  --     Estimated Creatinine Clearance: 87.4 mL/min (by C-G formula based on Cr of 1.04).   Medical History: Past Medical History  Diagnosis Date  . Nonischemic cardiomyopathy ([place])   . ICD (implantable cardioverter-defibrillator), single, in situ 08/26/2007  . Hx of echocardiogram 11/09/1910    Showed an EF of 25% with no significant valve disease.  Marland Kitchen AICD (automatic cardioverter/defibrillator) present   . Presence of permanent cardiac pacemaker   . CHF (congestive heart failure) (H[facility])     Infusions:  . heparin 1,200 Units/hr (05/09/15 2000)  . milrinone 0.5 mcg/kg/min (05/09/15 2210)  . [MAR Hold] norepinephrine (LEVOPHED) Adult infusion 6 mcg/min (05/09/15 2000)    Assessment: 50 yo who was admitted for a long hx CHF. He got a IABP over weekend while waiting for LVAD scheduled for tomorrow morning. Heparin has been ordered for anticoagulation.   HL therapeutic this am at 0.26. CBC stable overnight, no bleeding noted. For LVAD this am  Goal of Therapy:  Heparin level 0.2-0.5 units/ml Monitor platelets by anticoagulation protocol: Yes   Plan:  Continue heparin infusion at 1200 units/hr Follow up after LVAD implantation  Thank  you,  Erin Hearing PharmD., BCPS Clinical Pharmacist Pager [phone] 05/10/2015 7:41 AM

## 2015-05-10 NOTE — Brief Op Note (Signed)
04/29/2015 - 05/10/2015  2:42 PM  PATIENT:  Patrick Humphrey  50 y.o. male  PRE-OPERATIVE DIAGNOSIS:  ICM  POST-OPERATIVE DIAGNOSIS:  ICM  PROCEDURE:  Procedure(s) with comments: INSERTION OF HEARTMATE II IMPLANTABLE LEFT VENTRICULAR ASSIST DEVICE (N/A) -  TRANSESOPHAGEAL ECHOCARDIOGRAM (TEE) (N/A)  SURGEON:  Surgeon(s) and Role:    * Ivin Poot, MD - Primary    * Gaye Pollack, MD - Assisting  PHYSICIAN ASSISTANT: Jadene Pierini, PA-C  ASSISTANTS: Dineen Kid, RNFA  ANESTHESIA:   general  EBL:  Total I/O In: [id number] [I.V.:2500; Blood:1805; IV Piggyback:250] Out: 2300 [Urine:775; Blood:1525]  BLOOD ADMINISTERED:4 units FFP  DRAINS: 3  Chest Tube(s) in the mediastinum and left pleural space, 1 pocket drain   LOCAL MEDICATIONS USED:  NONE  SPECIMEN:  Source of Specimen:  myocardial plug  DISPOSITION OF SPECIMEN:  PATHOLOGY  COUNTS:  YES  TOURNIQUET:  * No tourniquets in log *  PLAN OF CARE: Admit to inpatient   PATIENT DISPOSITION:  ICU - intubated and hemodynamically stable.   Delay start of Pharmacological VTE agent (>24hrs) due to surgical blood loss or risk of bleeding: yes

## 2015-05-10 NOTE — Progress Notes (Signed)
Dr Darcey Nora updated on pt condition via telephone, Hgb drop from 11 to 8, CT output, urine output, and all other vital signs/labs, orders received for K runs and 2 units PRBCs. Confirmed NO wean to start at 0000. Will implement & continue to monitor.

## 2015-05-10 NOTE — Progress Notes (Signed)
CSW met in the waiting room with wife and extended family. Patient in OR for VAD implant. Wife is good spirits and noted patient was in good spirits this morning prior to surgery. CSW provided support and communication with VAD Coordinators. CSW will continue to follow for support to wife and patient throughout implant hospitalization. Raquel Sarna, [place]

## 2015-05-10 NOTE — Transfer of Care (Signed)
Immediate Anesthesia Transfer of Care Note  Patient: Patrick Humphrey  Procedure(s) Performed: Procedure(s) with comments: INSERTION OF IMPLANTABLE LEFT VENTRICULAR ASSIST DEVICE (N/A) - CIRC ARREST  NITRIC OXIDE TRANSESOPHAGEAL ECHOCARDIOGRAM (TEE) (N/A)  Patient Location: SICU  Anesthesia Type:General  Level of Consciousness: sedated and Patient remains intubated per anesthesia plan  Airway & Oxygen Therapy: Patient remains intubated per anesthesia plan and Patient placed on Ventilator (see vital sign flow sheet for setting)  Post-op Assessment: Report given to RN and Post -op Vital signs reviewed and stable  Post vital signs: Reviewed and stable  Last Vitals:  Filed Vitals:   05/10/15 0600 05/10/15 0700  BP: 133/92 133/83  Pulse:    Temp: 36.9 C 37.2 C  Resp: 16 16    Complications: No apparent anesthesia complications

## 2015-05-10 NOTE — Progress Notes (Signed)
  Echocardiogram Echocardiogram Transesophageal has been performed.  Jennette Dubin 05/10/2015, 8:56 AM

## 2015-05-10 NOTE — Progress Notes (Signed)
Transported to ICU with OR team on Nitric of 30ppm and Vent on current vent settings without complications.

## 2015-05-10 NOTE — Progress Notes (Signed)
ANTIBIOTIC CONSULT NOTE - INITIAL  Pharmacy Consult for Vancomycin  Indication: surgical prophylaxis  Allergies  Allergen Reactions  . Penicillins     Has patient had a PCN reaction causing immediate rash, facial/tongue/throat swelling, SOB or lightheadedness with hypotension: Yes Has patient had a PCN reaction causing severe rash involving mucus membranes or skin necrosis: No Has patient had a PCN reaction that required hospitalization No Has patient had a PCN reaction occurring within the last 10 years: No If all of the above answers are "NO", then may proceed with Cephalosporin use.  Unknown-childhood  . Sulfa Antibiotics     Patient Measurements: Height: 5\' 11"  (180.3 cm) Weight: 160 lb 4.4 oz (72.7 kg) IBW/kg (Calculated) : 75.3  Vital Signs: Temp: 99 F (37.2 C) (01/31 0700) Temp Source: Core (Comment) (01/31 0400) BP: 133/83 mmHg (01/31 0700) Intake/Output from previous day: 01/30 0701 - 01/31 0700 In: 1282.8 [P.O.:588; I.V.:694.8] Out: 3040 [Urine:3040] Intake/Output from this shift: Total I/O In: 4555 [I.V.:2500; Blood:1805; IV Piggyback:250] Out: 2300 [Urine:775; Blood:1525]  Labs:  Recent Labs  05/09/15 0500 05/10/15 0200  05/10/15 1104 05/10/15 1115 05/10/15 1120 05/10/15 1222  WBC 7.4 6.3  --   --   --   --   --   HGB 14.5 14.1  < > 12.2* 10.8* 11.9* 10.5*  PLT 180 154  --   --  91*  --   --   CREATININE 1.28* 1.04  < > 0.60*  --  0.60* 0.60*  < > = values in this interval not displayed. Estimated Creatinine Clearance: 113.6 mL/min (by C-G formula based on Cr of 0.6). No results for input(s): VANCOTROUGH, VANCOPEAK, VANCORANDOM, GENTTROUGH, GENTPEAK, GENTRANDOM, TOBRATROUGH, TOBRAPEAK, TOBRARND, AMIKACINPEAK, AMIKACINTROU, AMIKACIN in the last 72 hours.   Microbiology: Recent Results (from the past 720 hour(s))  MRSA PCR Screening     Status: None   Collection Time: 05/01/15  2:34 PM  Result Value Ref Range Status   MRSA by PCR NEGATIVE  NEGATIVE Final    Comment:        The GeneXpert MRSA Assay (FDA approved for NASAL specimens only), is one component of a comprehensive MRSA colonization surveillance program. It is not intended to diagnose MRSA infection nor to guide or monitor treatment for MRSA infections.   Surgical pcr screen     Status: None   Collection Time: 05/08/15  6:28 PM  Result Value Ref Range Status   MRSA, PCR NEGATIVE NEGATIVE Final   Staphylococcus aureus NEGATIVE NEGATIVE Final    Comment:        The Xpert SA Assay (FDA approved for NASAL specimens in patients over 37 years of age), is one component of a comprehensive surveillance program.  Test performance has been validated by [facility] for patients greater than or equal to 55 year old. It is not intended to diagnose infection nor to guide or monitor treatment.     Medical History: Past Medical History  Diagnosis Date  . Nonischemic cardiomyopathy ([place])   . ICD (implantable cardioverter-defibrillator), single, in situ 08/26/2007  . Hx of echocardiogram 11/09/1910    Showed an EF of 25% with no significant valve disease.  Marland Kitchen AICD (automatic cardioverter/defibrillator) present   . Presence of permanent cardiac pacemaker   . CHF (congestive heart failure) [facility])    Assessment: 50 year old male with long-standing history of chronic systolic heart failure, nonischemic cardiomyopathy, ICD, Dr. Gwenlyn Found, who has been experiencing increasing dyspnea consistent with acute on chronic systolic heart failure.  Patient  known to pharmacy through anticoagulation services. Patient now s/p LVAD today 1/31, orders received to continue vancomycin for 48 hours post-op.  Goal of Therapy:  Vancomycin trough level 10-15 mcg/ml  Plan:  Change vancomycin to 1500mg  IV q12 hours x 3 doses  Will check level if to continue past the 48 hours Adjust dosing based on any changes in renal function  Erin Hearing PharmD., BCPS Clinical Pharmacist Pager  [phone] 05/10/2015 3:01 PM

## 2015-05-10 NOTE — Anesthesia Procedure Notes (Addendum)
Procedure Name: Intubation Date/Time: 05/10/2015 8:14 AM Performed by: Judeth Cornfield T Pre-anesthesia Checklist: Patient identified, Emergency Drugs available, Suction available, Patient being monitored and Timeout performed Patient Re-evaluated:Patient Re-evaluated prior to inductionOxygen Delivery Method: Circle system utilized Preoxygenation: Pre-oxygenation with 100% oxygen Intubation Type: IV induction and Cricoid Pressure applied Ventilation: Mask ventilation without difficulty Laryngoscope Size: Mac and 4 Grade View: Grade II Tube type: Subglottic suction tube Tube size: 8.0 mm Number of attempts: 1 Airway Equipment and Method: Stylet Placement Confirmation: CO2 detector,  positive ETCO2,  ETT inserted through vocal cords under direct vision and breath sounds checked- equal and bilateral Secured at: 22 cm Tube secured with: Tape Dental Injury: Teeth and Oropharynx as per pre-operative assessment  Comments: DLX1 with MAC 4 - grade 1 view.  0820 - cuff leak, pt extubated to examine subglottic tube.  Reintubated atraumatically with 8.0 subglottic ETT.    Central Venous Catheter Insertion Performed by: anesthesiologist 05/10/2015 7:51 AM Patient location: Pre-op. Preanesthetic checklist: patient identified, IV checked, site marked, risks and benefits discussed, surgical consent, monitors and equipment checked, pre-op evaluation, timeout performed and anesthesia consent Position: Trendelenburg Lidocaine 1% used for infiltration Landmarks identified and Seldinger technique used Catheter size: 9 Fr PA cath was placed.Sheath introducer Swan type and PA catheter depth:thermodilation and 50PA Cath depth:50 Procedure performed using ultrasound guided technique. Attempts: 1 Following insertion, line sutured and dressing applied. Post procedure assessment: free fluid flow, blood return through all ports and no air. Patient tolerated the procedure well with no immediate  complications.

## 2015-05-10 NOTE — Progress Notes (Signed)
RT note per protocol, flow dropped below 4.0, increased NO back to 25ppm. Dr. Darcey Nora notified.

## 2015-05-10 NOTE — Anesthesia Postprocedure Evaluation (Signed)
Anesthesia Post Note  Patient: Patrick Humphrey  Procedure(s) Performed: Procedure(s) (LRB): INSERTION OF IMPLANTABLE LEFT VENTRICULAR ASSIST DEVICE (N/A) TRANSESOPHAGEAL ECHOCARDIOGRAM (TEE) (N/A)  Patient location during evaluation: SICU Anesthesia Type: General Level of consciousness: sedated Pain management: pain level controlled Vital Signs Assessment: post-procedure vital signs reviewed and stable Respiratory status: patient remains intubated per anesthesia plan Cardiovascular status: stable Anesthetic complications: no    Last Vitals:  Filed Vitals:   05/10/15 0600 05/10/15 0700  BP: 133/92 133/83  Pulse:    Temp: 36.9 C 37.2 C  Resp: 16 16    Last Pain:  Filed Vitals:   05/10/15 0705  PainSc: [place]

## 2015-05-10 NOTE — Progress Notes (Signed)
The patient was examined and preop studies reviewed. There has been no change from the prior exam and the patient is ready for surgery.  plan implantation of LVAD on Patrick Humphrey today

## 2015-05-10 NOTE — OR Nursing (Signed)
Forty-five minute call to SICU charge nurse at 1245.

## 2015-05-10 NOTE — OR Nursing (Signed)
Twenty minute call to SICU at 1328. Spoke with Tamela Oddi.

## 2015-05-10 NOTE — Progress Notes (Signed)
05/10/2015 Cardiac Cath Lab  IABP removed at 17:40 by S Forest Redwine and A Pifer.  25 min manual pressure held.  Doppler pedal pulse.  Vitals per chart, stable during pull.  No bleeding or hematoma.  Site unremarkable.

## 2015-05-11 ENCOUNTER — Inpatient Hospital Stay (HOSPITAL_COMMUNITY): Payer: Medicare Other

## 2015-05-11 ENCOUNTER — Encounter (HOSPITAL_COMMUNITY): Payer: Self-pay | Admitting: Cardiothoracic Surgery

## 2015-05-11 DIAGNOSIS — Z95811 Presence of heart assist device: Secondary | ICD-10-CM

## 2015-05-11 LAB — POCT I-STAT EG7
Acid-Base Excess: 9 mmol/L — ABNORMAL HIGH (ref 0.0–2.0)
Acid-Base Excess: 6 mmol/L — ABNORMAL HIGH (ref 0.0–2.0)
Acid-Base Excess: 7 mmol/L — ABNORMAL HIGH (ref 0.0–2.0)
Acid-Base Excess: 9 mmol/L — ABNORMAL HIGH (ref 0.0–2.0)
Acid-Base Excess: 2 mmol/L (ref 0.0–2.0)
Bicarbonate: 30.7 mEq/L — ABNORMAL HIGH (ref 20.0–24.0)
Bicarbonate: 28 mEq/L — ABNORMAL HIGH (ref 20.0–24.0)
Bicarbonate: 29.3 mEq/L — ABNORMAL HIGH (ref 20.0–24.0)
Bicarbonate: 29.8 mEq/L — ABNORMAL HIGH (ref 20.0–24.0)
Bicarbonate: 27.5 mEq/L — ABNORMAL HIGH (ref 20.0–24.0)
Calcium, Ion: 0.75 mmol/L — ABNORMAL LOW (ref 1.12–1.23)
Calcium, Ion: 0.76 mmol/L — ABNORMAL LOW (ref 1.12–1.23)
Calcium, Ion: 0.84 mmol/L — ABNORMAL LOW (ref 1.12–1.23)
Calcium, Ion: 0.87 mmol/L — ABNORMAL LOW (ref 1.12–1.23)
Calcium, Ion: 0.92 mmol/L — ABNORMAL LOW (ref 1.12–1.23)
HCT: 27 % — ABNORMAL LOW (ref 39.0–52.0)
HCT: 28 % — ABNORMAL LOW (ref 39.0–52.0)
HCT: 32 % — ABNORMAL LOW (ref 39.0–52.0)
HCT: 33 % — ABNORMAL LOW (ref 39.0–52.0)
HCT: 34 % — ABNORMAL LOW (ref 39.0–52.0)
Hemoglobin: 9.2 g/dL — ABNORMAL LOW (ref 13.0–17.0)
Hemoglobin: 10.9 g/dL — ABNORMAL LOW (ref 13.0–17.0)
Hemoglobin: 11.2 g/dL — ABNORMAL LOW (ref 13.0–17.0)
Hemoglobin: 9.5 g/dL — ABNORMAL LOW (ref 13.0–17.0)
Hemoglobin: 11.6 g/dL — ABNORMAL LOW (ref 13.0–17.0)
O2 Saturation: 100 %
O2 Saturation: 100 %
O2 Saturation: 100 %
O2 Saturation: 100 %
O2 Saturation: 66 %
Patient temperature: 36.6
Patient temperature: 36.5
Patient temperature: 36.5
Patient temperature: 36.5
Patient temperature: 36.6
Potassium: 3.4 mmol/L — ABNORMAL LOW (ref 3.5–5.1)
Potassium: 3 mmol/L — ABNORMAL LOW (ref 3.5–5.1)
Potassium: 3.1 mmol/L — ABNORMAL LOW (ref 3.5–5.1)
Potassium: 3.2 mmol/L — ABNORMAL LOW (ref 3.5–5.1)
Potassium: 3.1 mmol/L — ABNORMAL LOW (ref 3.5–5.1)
Sodium: 134 mmol/L — ABNORMAL LOW (ref 135–145)
Sodium: 133 mmol/L — ABNORMAL LOW (ref 135–145)
Sodium: 135 mmol/L (ref 135–145)
Sodium: 135 mmol/L (ref 135–145)
Sodium: 136 mmol/L (ref 135–145)
TCO2: 32 mmol/L (ref 0–100)
TCO2: 29 mmol/L (ref 0–100)
TCO2: 30 mmol/L (ref 0–100)
TCO2: 31 mmol/L (ref 0–100)
TCO2: 29 mmol/L (ref 0–100)
pCO2, Ven: 29.1 mmHg — ABNORMAL LOW (ref 45.0–50.0)
pCO2, Ven: 26.4 mmHg — ABNORMAL LOW (ref 45.0–50.0)
pCO2, Ven: 28.5 mmHg — ABNORMAL LOW (ref 45.0–50.0)
pCO2, Ven: 32.4 mmHg — ABNORMAL LOW (ref 45.0–50.0)
pCO2, Ven: 44.6 mmHg — ABNORMAL LOW (ref 45.0–50.0)
pH, Ven: 7.631 (ref 7.250–7.300)
pH, Ven: 7.563 — ABNORMAL HIGH (ref 7.250–7.300)
pH, Ven: 7.599 — ABNORMAL HIGH (ref 7.250–7.300)
pH, Ven: 7.66 (ref 7.250–7.300)
pH, Ven: 7.395 — ABNORMAL HIGH (ref 7.250–7.300)
pO2, Ven: 492 mmHg — ABNORMAL HIGH (ref 30.0–45.0)
pO2, Ven: 525 mmHg — ABNORMAL HIGH (ref 30.0–45.0)
pO2, Ven: 528 mmHg — ABNORMAL HIGH (ref 30.0–45.0)
pO2, Ven: 547 mmHg — ABNORMAL HIGH (ref 30.0–45.0)
pO2, Ven: 34 mmHg (ref 30.0–45.0)

## 2015-05-11 LAB — CBC WITH DIFFERENTIAL/PLATELET
Basophils Absolute: 0.1 10*3/uL (ref 0.0–0.1)
Basophils Relative: 1 %
Eosinophils Absolute: 0.2 10*3/uL (ref 0.0–0.7)
Eosinophils Relative: 3 %
HCT: 28.2 % — ABNORMAL LOW (ref 39.0–52.0)
Hemoglobin: 9.6 g/dL — ABNORMAL LOW (ref 13.0–17.0)
Lymphocytes Relative: 22 %
Lymphs Abs: 1.3 10*3/uL (ref 0.7–4.0)
MCH: 31.7 pg (ref 26.0–34.0)
MCHC: 34 g/dL (ref 30.0–36.0)
MCV: 93.1 fL (ref 78.0–100.0)
Monocytes Absolute: 0.4 10*3/uL (ref 0.1–1.0)
Monocytes Relative: 8 %
Neutro Abs: 3.7 10*3/uL (ref 1.7–7.7)
Neutrophils Relative %: 66 %
Platelets: 92 10*3/uL — ABNORMAL LOW (ref 150–400)
RBC: 3.03 MIL/uL — ABNORMAL LOW (ref 4.22–5.81)
RDW: 14.6 % (ref 11.5–15.5)
WBC: 5.6 10*3/uL (ref 4.0–10.5)

## 2015-05-11 LAB — PREPARE FRESH FROZEN PLASMA
Unit division: 0
Unit division: 0
Unit division: 0
Unit division: 0
Unit division: 0
Unit division: 0

## 2015-05-11 LAB — CBC
HCT: 24.1 % — ABNORMAL LOW (ref 39.0–52.0)
Hemoglobin: 8.4 g/dL — ABNORMAL LOW (ref 13.0–17.0)
MCH: 32.7 pg (ref 26.0–34.0)
MCHC: 34.9 g/dL (ref 30.0–36.0)
MCV: 93.8 fL (ref 78.0–100.0)
Platelets: 91 10*3/uL — ABNORMAL LOW (ref 150–400)
RBC: 2.57 MIL/uL — ABNORMAL LOW (ref 4.22–5.81)
RDW: 15.4 % (ref 11.5–15.5)
WBC: 8 10*3/uL (ref 4.0–10.5)

## 2015-05-11 LAB — COMPREHENSIVE METABOLIC PANEL
ALT: 24 U/L (ref 17–63)
AST: 84 U/L — ABNORMAL HIGH (ref 15–41)
Albumin: 3.1 g/dL — ABNORMAL LOW (ref 3.5–5.0)
Alkaline Phosphatase: 37 U/L — ABNORMAL LOW (ref 38–126)
Anion gap: 10 (ref 5–15)
BUN: 13 mg/dL (ref 6–20)
CO2: 24 mmol/L (ref 22–32)
Calcium: 7.9 mg/dL — ABNORMAL LOW (ref 8.9–10.3)
Chloride: 102 mmol/L (ref 101–111)
Creatinine, Ser: 0.77 mg/dL (ref 0.61–1.24)
GFR calc Af Amer: >60 mL/min (ref >60)
GFR calc non Af Amer: >60 mL/min (ref >60)
Glucose, Bld: 85 mg/dL (ref 65–99)
Potassium: 4.5 mmol/L (ref 3.5–5.1)
Sodium: 136 mmol/L (ref 135–145)
Total Bilirubin: 9.2 mg/dL — ABNORMAL HIGH (ref 0.3–1.2)
Total Protein: 4.8 g/dL — ABNORMAL LOW (ref 6.5–8.1)

## 2015-05-11 LAB — POCT I-STAT 7, (LYTES, BLD GAS, ICA,H+H)
Acid-Base Excess: 5 mmol/L — ABNORMAL HIGH (ref 0.0–2.0)
Bicarbonate: 27.9 mEq/L — ABNORMAL HIGH (ref 20.0–24.0)
Calcium, Ion: 1 mmol/L — ABNORMAL LOW (ref 1.12–1.23)
HCT: 44 % (ref 39.0–52.0)
Hemoglobin: 15 g/dL (ref 13.0–17.0)
O2 Saturation: 100 %
Patient temperature: 35.6
Potassium: 3.4 mmol/L — ABNORMAL LOW (ref 3.5–5.1)
Sodium: 132 mmol/L — ABNORMAL LOW (ref 135–145)
TCO2: 29 mmol/L (ref 0–100)
pCO2 arterial: 31.4 mmHg — ABNORMAL LOW (ref 35.0–45.0)
pH, Arterial: 7.552 — ABNORMAL HIGH (ref 7.350–7.450)
pO2, Arterial: 270 mmHg — ABNORMAL HIGH (ref 80.0–100.0)

## 2015-05-11 LAB — BRAIN NATRIURETIC PEPTIDE: B Natriuretic Peptide: 1201.7 pg/mL — ABNORMAL HIGH (ref 0.0–100.0)

## 2015-05-11 LAB — POCT I-STAT 3, ART BLOOD GAS (G3+)
Acid-base deficit: 2 mmol/L (ref 0.0–2.0)
Acid-base deficit: 2 mmol/L (ref 0.0–2.0)
Acid-base deficit: 2 mmol/L (ref 0.0–2.0)
Bicarbonate: 24.4 mEq/L — ABNORMAL HIGH (ref 20.0–24.0)
Bicarbonate: 23.7 mEq/L (ref 20.0–24.0)
Bicarbonate: 24.2 mEq/L — ABNORMAL HIGH (ref 20.0–24.0)
Bicarbonate: 23.3 mEq/L (ref 20.0–24.0)
O2 Saturation: 100 %
O2 Saturation: 98 %
O2 Saturation: 97 %
O2 Saturation: 97 %
Patient temperature: 36.8
Patient temperature: 37.7
Patient temperature: 37.2
Patient temperature: 98.9
TCO2: 26 mmol/L (ref 0–100)
TCO2: 25 mmol/L (ref 0–100)
TCO2: 26 mmol/L (ref 0–100)
TCO2: 25 mmol/L (ref 0–100)
pCO2 arterial: 37.2 mmHg (ref 35.0–45.0)
pCO2 arterial: 44.9 mmHg (ref 35.0–45.0)
pCO2 arterial: 46.8 mmHg — ABNORMAL HIGH (ref 35.0–45.0)
pCO2 arterial: 41 mmHg (ref 35.0–45.0)
pH, Arterial: 7.424 (ref 7.350–7.450)
pH, Arterial: 7.334 — ABNORMAL LOW (ref 7.350–7.450)
pH, Arterial: 7.323 — ABNORMAL LOW (ref 7.350–7.450)
pH, Arterial: 7.363 (ref 7.350–7.450)
pO2, Arterial: 206 mmHg — ABNORMAL HIGH (ref 80.0–100.0)
pO2, Arterial: 114 mmHg — ABNORMAL HIGH (ref 80.0–100.0)
pO2, Arterial: 94 mmHg (ref 80.0–100.0)
pO2, Arterial: 90 mmHg (ref 80.0–100.0)

## 2015-05-11 LAB — POCT I-STAT, CHEM 8
BUN: 12 mg/dL (ref 6–20)
Calcium, Ion: 1.18 mmol/L (ref 1.12–1.23)
Chloride: 97 mmol/L — ABNORMAL LOW (ref 101–111)
Creatinine, Ser: 0.7 mg/dL (ref 0.61–1.24)
Glucose, Bld: 124 mg/dL — ABNORMAL HIGH (ref 65–99)
HCT: 26 % — ABNORMAL LOW (ref 39.0–52.0)
Hemoglobin: 8.8 g/dL — ABNORMAL LOW (ref 13.0–17.0)
Potassium: 4.5 mmol/L (ref 3.5–5.1)
Sodium: 135 mmol/L (ref 135–145)
TCO2: 23 mmol/L (ref 0–100)

## 2015-05-11 LAB — GLUCOSE, CAPILLARY
Glucose-Capillary: 95 mg/dL (ref 65–99)
Glucose-Capillary: 79 mg/dL (ref 65–99)
Glucose-Capillary: 113 mg/dL — ABNORMAL HIGH (ref 65–99)
Glucose-Capillary: 108 mg/dL — ABNORMAL HIGH (ref 65–99)
Glucose-Capillary: 113 mg/dL — ABNORMAL HIGH (ref 65–99)
Glucose-Capillary: 108 mg/dL — ABNORMAL HIGH (ref 65–99)

## 2015-05-11 LAB — CALCIUM, IONIZED: Calcium, Ionized, Serum: 4.2 mg/dL — ABNORMAL LOW (ref 4.5–5.6)

## 2015-05-11 LAB — PREPARE PLATELET PHERESIS: Unit division: 0

## 2015-05-11 LAB — POCT I-STAT 4, (NA,K, GLUC, HGB,HCT)
Glucose, Bld: 94 mg/dL (ref 65–99)
HCT: 43 % (ref 39.0–52.0)
Hemoglobin: 14.6 g/dL (ref 13.0–17.0)
Potassium: 3.4 mmol/L — ABNORMAL LOW (ref 3.5–5.1)
Sodium: 133 mmol/L — ABNORMAL LOW (ref 135–145)

## 2015-05-11 LAB — CARBOXYHEMOGLOBIN
Carboxyhemoglobin: 2.2 % — ABNORMAL HIGH (ref 0.5–1.5)
Methemoglobin: 1 % (ref 0.0–1.5)
O2 Saturation: 72.2 %
Total hemoglobin: 9.4 g/dL — ABNORMAL LOW (ref 13.5–18.0)

## 2015-05-11 LAB — HEMOGLOBIN A1C
Hgb A1c MFr Bld: 5.5 % (ref 4.8–5.6)
Mean Plasma Glucose: 111 mg/dL

## 2015-05-11 LAB — PHOSPHORUS: Phosphorus: 2.6 mg/dL (ref 2.5–4.6)

## 2015-05-11 LAB — CREATININE, SERUM
Creatinine, Ser: 0.8 mg/dL (ref 0.61–1.24)
GFR calc Af Amer: >60 mL/min (ref >60)
GFR calc non Af Amer: >60 mL/min (ref >60)

## 2015-05-11 LAB — MAGNESIUM
Magnesium: 2.3 mg/dL (ref 1.7–2.4)
Magnesium: 2 mg/dL (ref 1.7–2.4)

## 2015-05-11 LAB — PROTIME-INR
INR: 1.57 — ABNORMAL HIGH (ref 0.00–1.49)
Prothrombin Time: 18.8 seconds — ABNORMAL HIGH (ref 11.6–15.2)

## 2015-05-11 LAB — LACTATE DEHYDROGENASE: LDH: 247 U/L — ABNORMAL HIGH (ref 98–192)

## 2015-05-11 MED ORDER — ALPRAZOLAM 0.25 MG PO TABS
0.2500 mg | ORAL_TABLET | Freq: Four times a day (QID) | ORAL | Status: DC | PRN
  Administered 2015-05-11 – 2015-05-13 (×6): 0.25 mg via ORAL
  Filled 2015-05-11 (×6): qty 1

## 2015-05-11 MED ORDER — FUROSEMIDE 10 MG/ML IJ SOLN
40.0000 mg | Freq: Once | INTRAMUSCULAR | Status: AC
  Administered 2015-05-11: 40 mg via INTRAVENOUS
  Filled 2015-05-11: qty 4

## 2015-05-11 MED ORDER — SODIUM CHLORIDE 0.9 % IV SOLN
600.0000 mg | Freq: Once | INTRAVENOUS | Status: AC
  Administered 2015-05-11: 600 mg via INTRAVENOUS
  Filled 2015-05-11: qty 600

## 2015-05-11 MED ORDER — POTASSIUM CHLORIDE CRYS ER 20 MEQ PO TBCR
20.0000 meq | EXTENDED_RELEASE_TABLET | Freq: Once | ORAL | Status: AC
  Administered 2015-05-11: 20 meq via ORAL
  Filled 2015-05-11: qty 1

## 2015-05-11 MED ORDER — VANCOMYCIN HCL 10 G IV SOLR
1500.0000 mg | Freq: Two times a day (BID) | INTRAVENOUS | Status: AC
  Administered 2015-05-11 – 2015-05-12 (×3): 1500 mg via INTRAVENOUS
  Filled 2015-05-11 (×3): qty 1500

## 2015-05-11 MED ORDER — CETYLPYRIDINIUM CHLORIDE 0.05 % MT LIQD
7.0000 mL | Freq: Two times a day (BID) | OROMUCOSAL | Status: DC
  Administered 2015-05-11 – 2015-05-30 (×31): 7 mL via OROMUCOSAL

## 2015-05-11 NOTE — Progress Notes (Signed)
Pt tolerating NO weaning well. No complications noted. RN at bedside.

## 2015-05-11 NOTE — Progress Notes (Addendum)
NO weaned off from 1 L, NO tank closed@(1775psig)with INO-max switched off-remains plugged in/charging, 4 lpm n/c remains per Dr. Cyndia Bent w/o, RN aware, RT to monitor.

## 2015-05-11 NOTE — Progress Notes (Signed)
Weaning nitric oxide. Pt is tolerating it well. No complications noted. PI/PAP/CVP are stable. RT will continue to assess and monitor pt status. RN aware of weaning initiation of the NO.

## 2015-05-11 NOTE — Progress Notes (Signed)
Paged for HR in 120s.  Per Dr. Aundra Dubin fine as long as he remains in NSR.  Will follow. Continue titrating norepi as able.  Also got xanax for anxiety earlier.  Legrand Como [address] [place], PA-C 05/11/2015 2:30 PM

## 2015-05-11 NOTE — Progress Notes (Signed)
CSW met at bedside with patient's wife. Patient just extubated and giving thumbs up. Wife very relieved that patient is doing so well and stated "he looks better already". Wife states that patient stated he just wants to sleep so she will leave and return later today to visit so patient can rest. Wife appears in good spirits and coping well with surgery and recovery. CSW will continue to follow for supportive intervention throughout implant hospitalization. Raquel Sarna, [place]

## 2015-05-11 NOTE — Procedures (Signed)
Extubation Procedure Note  Patient Details:   Name: Patrick Humphrey DOB: Dec 29, 1965 MRN: [id number]   Airway Documentation:     Evaluation  O2 sats: stable throughout Complications: No apparent complications Patient did tolerate procedure well. Bilateral Breath Sounds: Clear Suctioning: Oral, Airway Yes   NIF -24, VC 0.9L, Pateint extubated to [facility] with 2.5 PPM of nitric. Tolerated well. IS 500, left at bedside. RT to monitor as needed.   Saunders Glance 05/11/2015, 9:10 AM

## 2015-05-11 NOTE — Progress Notes (Signed)
Patient ID: Patrick Humphrey, male   DOB: 10/12/1965, 50 y.o.   MRN: [id number] HeartMate 2 Rounding Note  Subjective:    Intubated but awake and following commands. NO weaned down to 5 ppm overnight.  PI dropped overnight and Hgb dropped to 8.0 so transfused 2 units of PRBC's.  MAP has been lower overnight and levophed increased. It is 76 this am by doppler.  CI 2.6 Co-ox 76 SVO2 65  CVP 10 PA 36/21  Milrinone 0.375, Epi 4, levophed 8, NO 5ppm     LVAD INTERROGATION:  HeartMate II LVAD:  Flow 4.4 liters/min, speed 9000, power 4.9, PI 5.3.    Objective:    Vital Signs:   Temp:  [97.5 F (36.4 C)-99.5 F (37.5 C)] 98.8 F (37.1 C) (02/01 0730) Pulse Rate:  [40-111] 107 (02/01 0730) Resp:  [0-21] 12 (02/01 0730) BP: (76-92)/(52-69) 76/69 mmHg (01/31 2357) SpO2:  [92 %-100 %] 100 % (02/01 0730) Arterial Line BP: (63-115)/(45-78) 82/74 mmHg (02/01 0730) FiO2 (%):  [40 %-50 %] 40 % (02/01 0600) Weight:  [81.5 kg (179 lb 10.8 oz)] 81.5 kg (179 lb 10.8 oz) (02/01 0500) Last BM Date: 05/06/15 Mean arterial Pressure 76  Intake/Output:   Intake/Output Summary (Last 24 hours) at 05/11/15 0757 Last data filed at 05/11/15 0700  Gross per 24 hour  Intake 10571.42 ml  Output   5270 ml  Net 5301.42 ml     Physical Exam: General:  Well appearing. No resp difficulty on vent HEENT: intubated Cor: Normal heart sounds with LVAD hum present. Aortic valve sounds like it is opening with each beat. Lungs: clear Chest tube has 4 chamber air leak which is coming from left pleural tube. Abdomen: soft, nontender, nondistended. No hepatosplenomegaly. No bruits or masses. few bowel sounds. Extremities: moderate edema, warm. Both feet pink. Neuro: alert , moves all 4 extremities w/o difficulty. Calm  Telemetry: sinus tach 109  Labs: Basic Metabolic Panel:  Recent Labs Lab 05/08/15 0539 05/09/15 0500 05/10/15 0200  05/10/15 1222  05/10/15 1334 05/10/15 1434 05/10/15 1520  05/10/15 2000 05/10/15 2018 05/11/15 0320  NA 129* 131* 127*  < > 133*  < > 136 135 137  --  137 136  K 3.3* 4.2 3.6  < > 3.3*  < > 3.1* 3.4* 3.5  --  3.0* 4.5  CL 86* 89* 84*  < > 90*  --   --  94* 100*  --  97* 102  CO2 31 28 31   --   --   --   --   --  27  --   --  24  GLUCOSE 176* 137* 216*  < > 124*  --   --  126* 120*  --  85 85  BUN 18 22* 20  < > 17  --   --  16 13  --  14 13  CREATININE 1.12 1.28* 1.04  < > 0.60*  --   --  0.70 0.91 0.92 0.70 0.77  CALCIUM 8.5* 8.9 8.4*  --   --   --   --   --  7.7*  --   --  7.9*  MG  --   --   --   --   --   --   --   --  1.8 2.9*  --  2.3  PHOS  --   --   --   --   --   --   --   --   --   --   --  2.6  < > = values in this interval not displayed.  Liver Function Tests:  Recent Labs Lab 05/09/15 0500 05/11/15 0320  AST 68* 84*  ALT 38 24  ALKPHOS 81 37*  BILITOT 3.5* 9.2*  PROT 6.5 4.8*  ALBUMIN 3.0* 3.1*   No results for input(s): LIPASE, AMYLASE in the last 168 hours. No results for input(s): AMMONIA in the last 168 hours.  CBC:  Recent Labs Lab 05/09/15 0500 05/10/15 0200  05/10/15 1115  05/10/15 1434 05/10/15 1445 05/10/15 1520 05/10/15 2000 05/10/15 2018 05/11/15 0320  WBC 7.4 6.3  --   --   --   --   --  6.4 5.4  --  5.6  NEUTROABS  --   --   --   --   --   --   --   --   --   --  3.7  HGB 14.5 14.1  < > 10.8*  < > 12.2*  --  11.2* 8.0* 8.2* 9.6*  HCT 41.6 40.4  < > 30.8*  < > 36.0*  --  32.6* 22.8* 24.0* 28.2*  MCV 94.5 93.3  --   --   --   --   --  93.7 94.6  --  93.1  PLT 180 154  --  91*  --   --  81* 79* 99*  --  92*  < > = values in this interval not displayed.  INR:  Recent Labs Lab 05/10/15 0200 05/10/15 1445 05/10/15 1520 05/11/15 0320  INR 1.19 1.33 1.35 1.57*   LDH 247  Other results:  EKG:   Imaging: Dg Chest Port 1 View  05/11/2015  CLINICAL DATA:  Left ventricular assist device. EXAM: PORTABLE CHEST 1 VIEW COMPARISON:  05/10/2015. FINDINGS: Endotracheal tube, NG tube, Swan-Ganz  catheter, left chest tube in stable position. Cardiac pacer in stable position. Left ventricular assist device in stable position were visualized. Prior median sternotomy. Cardiomegaly with bilateral from interstitial prominence and pleural effusions consistent with congestive heart failure. These findings are new from prior exam . No pneumothorax. IMPRESSION: 1. Lines and tubes including left chest tube in stable position. No pneumothorax. 2. Cardiac pacer and left ventricular assist device again noted. Cardiomegaly with new onset bilateral pulmonary interstitial edema and small pleural effusions. Electronically Signed   By: Marcello Moores  Register   On: 05/11/2015 07:23   Dg Chest Port 1 View  05/10/2015  CLINICAL DATA:  Status post left ventricular assistance device placement. EXAM: PORTABLE CHEST 1 VIEW COMPARISON:  Same day. FINDINGS: There is been interval placement of left ventricular assistance device. Endotracheal tube is seen projected over tracheal air shadow with distal tip 7 cm above the carina. Nasogastric tube is seen entering stomach. Bilateral chest tubes are noted without definite pneumothorax. Right internal jugular Swan-Ganz catheter is noted with tip in expected position of main pulmonary artery. No significant pleural effusion is noted. Right-sided pacemaker is again noted and unchanged in position. Distal tip of aortic balloon catheter is seen unchanged in position at the level of aortic arch. Pulmonary edema noted on prior exam appears to be improved. IMPRESSION: Interval placement of left ventricular assistance device. Bilateral chest tubes are noted without pneumothorax. Endotracheal and nasogastric tubes appear to be in grossly good position. Right internal jugular Swan-Ganz catheter tip is in expected position of main pulmonary artery. Electronically Signed   By: Marijo Conception, M.D.   On: 05/10/2015 15:33   Dg Chest Port 1 View  05/10/2015  CLINICAL DATA:  Heart failure EXAM: PORTABLE  CHEST 1 VIEW COMPARISON:  05/09/2015 FINDINGS: Progression of vascular congestion with mild interstitial edema. No significant pleural effusion. Femoral approach Swan-Ganz catheter tip in the right lower pulmonary artery unchanged. Left subclavian central venous catheter tip in the mid right atrium unchanged. AICD unchanged. Intra-aortic balloon pump at the aortic arch unchanged. IMPRESSION: Mild progression of interstitial edema. Electronically Signed   By: Franchot Gallo M.D.   On: 05/10/2015 06:37      Medications:     Scheduled Medications: . acetaminophen  1,000 mg Oral 4 times per day   Or  . acetaminophen (TYLENOL) oral liquid 160 mg/5 mL  1,000 mg Per Tube 4 times per day  . antiseptic oral rinse  7 mL Mouth Rinse QID  . aspirin EC  325 mg Oral Daily   Or  . aspirin  324 mg Per Tube Daily   Or  . aspirin  300 mg Rectal Daily  . bisacodyl  10 mg Oral Daily   Or  . bisacodyl  10 mg Rectal Daily  . chlorhexidine gluconate  15 mL Mouth Rinse BID  . docusate sodium  200 mg Oral Daily  . fluconazole (DIFLUCAN) IV  400 mg Intravenous Once  . insulin aspart  0-24 Units Subcutaneous 6 times per day  . metoCLOPramide (REGLAN) injection  10 mg Intravenous 4 times per day  . [START ON 05/12/2015] pantoprazole  40 mg Oral Daily  . rifampin  600 mg Oral Once  . sodium chloride flush  3 mL Intravenous Q12H  . vancomycin  1,500 mg Intravenous Q12H     Infusions: . sodium chloride 20 mL/hr at 05/11/15 0400  . sodium chloride 250 mL (05/11/15 0606)  . sodium chloride    . dexmedetomidine 0.2 mcg/kg/hr (05/11/15 0700)  . epinephrine 4 mcg/min (05/11/15 0700)  . lactated ringers 20 mL/hr at 05/11/15 0700  . lactated ringers 20 mL/hr at 05/11/15 0700  . milrinone 0.375 mcg/kg/min (05/11/15 0700)  . nitroGLYCERIN    . norepinephrine (LEVOPHED) Adult infusion 8 mcg/min (05/11/15 0700)  . phenylephrine (NEO-SYNEPHRINE) Adult infusion       PRN Medications:  sodium chloride, lactated  ringers, midazolam, morphine injection, ondansetron (ZOFRAN) IV, oxyCODONE, sodium chloride flush, traMADol   Assessment/Plan/Discussion:    1. Nonischemic cardiomyopathy since 2002 with acute on chronic systolic heart failure and cardiogenic shock, deteriorating on 2 inotropes and had IABP placed. Moderate RV systolic dysfunction on preop echo. POD 1 s/p HeartMate II LVAD.  2. [employer] ICD  3. Postop acute blood loss anemia: observe. Minimize transfusion since he will probably be a transplant candidate.  4. Elevated bili and AST probably due to preop cardiogenic shock and will improve.  5. Postop thrombocytopenia: stable, follow.  He has been fairly stable overnight but had drop in Hgb to 8.0 with a drop in PI and MAP. This drop in Hgb was probably due to mild chest tube losses, hemodilution. He did have some blood tinged OG secretions. Hemodynamics and pump parameters appear stable this am so will continue weaning NO and wean vent to extubate.  Keep chest tubes in.  No anticoagulation today      I reviewed the LVAD parameters from today, and compared the results to the patient's prior recorded data.  No programming changes were made.  The LVAD is functioning within specified parameters.    LVAD equipment check completed and is in good working order.  Back-up equipment present.  Length of Stay:  [address]  Gaye Pollack 05/11/2015, 7:57 AM

## 2015-05-11 NOTE — Progress Notes (Signed)
Patient ID: Patrick Humphrey, male   DOB: 12/21/1965, 50 y.o.   MRN: [id number] HeartMate 2 Rounding Note  Subjective:    LVAD placement 05/10/15.   Doing well post-op, extubated 2/1.  Awake/alert, some soreness at surgical site.  Got 2 units PRBCs yesterday.  Chest tubes remain in place.  Some hematuria noted.  MAP stable in 70s, currently 76.   Swan: CVP 12 PA 39/24 CI 3.2  Co-ox 72%  He remains on norepinephrine at 4, epinephrine at 4, milrinone at 0.375.  Titrating down NO, currently 3 ppm.   LVAD INTERROGATION:  HeartMate II LVAD:  Flow 4.9 liters/min, speed 9000, power 5.1, PI 5.2.  No PI events.   Objective:    Vital Signs:   Temp:  [97.5 F (36.4 C)-100 F (37.8 C)] 99.1 F (37.3 C) (02/01 1015) Pulse Rate:  [40-118] 118 (02/01 1015) Resp:  [0-29] 20 (02/01 1015) BP: (76-92)/(52-76) 85/76 mmHg (02/01 0908) SpO2:  [92 %-100 %] 100 % (02/01 1015) Arterial Line BP: (63-115)/(3-84) 110/3 mmHg (02/01 1015) FiO2 (%):  [40 %-50 %] 40 % (02/01 0828) Weight:  [179 lb 10.8 oz (81.5 kg)] 179 lb 10.8 oz (81.5 kg) (02/01 0500) Last BM Date: 05/06/15 Mean arterial Pressure 70s  Intake/Output:   Intake/Output Summary (Last 24 hours) at 05/11/15 1115 Last data filed at 05/11/15 0926  Gross per 24 hour  Intake 9698.9 ml  Output   5510 ml  Net 4188.9 ml     Physical Exam: General:  Well appearing. No resp difficulty HEENT: normal Neck: supple. JVP 10. Carotids 2+ bilat; no bruits. No lymphadenopathy or thryomegaly appreciated. Cor: Mechanical heart sounds with LVAD hum present. Lungs: clear Abdomen: soft, nontender, nondistended. No hepatosplenomegaly. No bruits or masses. Good bowel sounds. Driveline: C/D/I; securement device intact and driveline incorporated Extremities: no cyanosis, clubbing, rash, edema Neuro: alert & orientedx3, cranial nerves grossly intact. moves all 4 extremities w/o difficulty. Affect pleasant  Telemetry: sinus tachy 100s-110s  Labs: Basic  Metabolic Panel:  Recent Labs Lab 05/08/15 0539 05/09/15 0500 05/10/15 0200  05/10/15 1222  05/10/15 1334 05/10/15 1434 05/10/15 1520 05/10/15 2000 05/10/15 2018 05/11/15 0320  NA 129* 131* 127*  < > 133*  < > 136 135 137  --  137 136  K 3.3* 4.2 3.6  < > 3.3*  < > 3.1* 3.4* 3.5  --  3.0* 4.5  CL 86* 89* 84*  < > 90*  --   --  94* 100*  --  97* 102  CO2 31 28 31   --   --   --   --   --  27  --   --  24  GLUCOSE 176* 137* 216*  < > 124*  --   --  126* 120*  --  85 85  BUN 18 22* 20  < > 17  --   --  16 13  --  14 13  CREATININE 1.12 1.28* 1.04  < > 0.60*  --   --  0.70 0.91 0.92 0.70 0.77  CALCIUM 8.5* 8.9 8.4*  --   --   --   --   --  7.7*  --   --  7.9*  MG  --   --   --   --   --   --   --   --  1.8 2.9*  --  2.3  PHOS  --   --   --   --   --   --   --   --   --   --   --  2.6  < > = values in this interval not displayed.  Liver Function Tests:  Recent Labs Lab 05/09/15 0500 05/11/15 0320  AST 68* 84*  ALT 38 24  ALKPHOS 81 37*  BILITOT 3.5* 9.2*  PROT 6.5 4.8*  ALBUMIN 3.0* 3.1*   No results for input(s): LIPASE, AMYLASE in the last 168 hours. No results for input(s): AMMONIA in the last 168 hours.  CBC:  Recent Labs Lab 05/09/15 0500 05/10/15 0200  05/10/15 1115  05/10/15 1434 05/10/15 1445 05/10/15 1520 05/10/15 2000 05/10/15 2018 05/11/15 0320  WBC 7.4 6.3  --   --   --   --   --  6.4 5.4  --  5.6  NEUTROABS  --   --   --   --   --   --   --   --   --   --  3.7  HGB 14.5 14.1  < > 10.8*  < > 12.2*  --  11.2* 8.0* 8.2* 9.6*  HCT 41.6 40.4  < > 30.8*  < > 36.0*  --  32.6* 22.8* 24.0* 28.2*  MCV 94.5 93.3  --   --   --   --   --  93.7 94.6  --  93.1  PLT 180 154  --  91*  --   --  81* 79* 99*  --  92*  < > = values in this interval not displayed.  INR:  Recent Labs Lab 05/10/15 0200 05/10/15 1445 05/10/15 1520 05/11/15 0320  INR 1.19 1.33 1.35 1.57*    Other results:  EKG:   Imaging: Dg Chest Port 1 View  05/11/2015  CLINICAL DATA:   Left ventricular assist device. EXAM: PORTABLE CHEST 1 VIEW COMPARISON:  05/10/2015. FINDINGS: Endotracheal tube, NG tube, Swan-Ganz catheter, left chest tube in stable position. Cardiac pacer in stable position. Left ventricular assist device in stable position were visualized. Prior median sternotomy. Cardiomegaly with bilateral from interstitial prominence and pleural effusions consistent with congestive heart failure. These findings are new from prior exam . No pneumothorax. IMPRESSION: 1. Lines and tubes including left chest tube in stable position. No pneumothorax. 2. Cardiac pacer and left ventricular assist device again noted. Cardiomegaly with new onset bilateral pulmonary interstitial edema and small pleural effusions. Electronically Signed   By: Marcello Moores  Register   On: 05/11/2015 07:23   Dg Chest Port 1 View  05/10/2015  CLINICAL DATA:  Status post left ventricular assistance device placement. EXAM: PORTABLE CHEST 1 VIEW COMPARISON:  Same day. FINDINGS: There is been interval placement of left ventricular assistance device. Endotracheal tube is seen projected over tracheal air shadow with distal tip 7 cm above the carina. Nasogastric tube is seen entering stomach. Bilateral chest tubes are noted without definite pneumothorax. Right internal jugular Swan-Ganz catheter is noted with tip in expected position of main pulmonary artery. No significant pleural effusion is noted. Right-sided pacemaker is again noted and unchanged in position. Distal tip of aortic balloon catheter is seen unchanged in position at the level of aortic arch. Pulmonary edema noted on prior exam appears to be improved. IMPRESSION: Interval placement of left ventricular assistance device. Bilateral chest tubes are noted without pneumothorax. Endotracheal and nasogastric tubes appear to be in grossly good position. Right internal jugular Swan-Ganz catheter tip is in expected position of main pulmonary artery. Electronically Signed    By: Marijo Conception, M.D.   On: 05/10/2015 15:33   Dg Chest Port 1 View  05/10/2015  CLINICAL DATA:  Heart failure EXAM: PORTABLE CHEST 1 VIEW COMPARISON:  05/09/2015 FINDINGS: Progression of vascular congestion with mild interstitial edema. No significant pleural effusion. Femoral approach Swan-Ganz catheter tip in the right lower pulmonary artery unchanged. Left subclavian central venous catheter tip in the mid right atrium unchanged. AICD unchanged. Intra-aortic balloon pump at the aortic arch unchanged. IMPRESSION: Mild progression of interstitial edema. Electronically Signed   By: Franchot Gallo M.D.   On: 05/10/2015 06:37      Medications:     Scheduled Medications: . acetaminophen  1,000 mg Oral 4 times per day   Or  . acetaminophen (TYLENOL) oral liquid 160 mg/5 mL  1,000 mg Per Tube 4 times per day  . antiseptic oral rinse  7 mL Mouth Rinse QID  . aspirin EC  325 mg Oral Daily   Or  . aspirin  324 mg Per Tube Daily   Or  . aspirin  300 mg Rectal Daily  . bisacodyl  10 mg Oral Daily   Or  . bisacodyl  10 mg Rectal Daily  . chlorhexidine gluconate  15 mL Mouth Rinse BID  . docusate sodium  200 mg Oral Daily  . insulin aspart  0-24 Units Subcutaneous 6 times per day  . metoCLOPramide (REGLAN) injection  10 mg Intravenous 4 times per day  . [START ON 05/12/2015] pantoprazole  40 mg Oral Daily  . sodium chloride flush  3 mL Intravenous Q12H  . vancomycin  1,500 mg Intravenous Q12H     Infusions: . sodium chloride 20 mL/hr at 05/11/15 0800  . sodium chloride 250 mL (05/11/15 0606)  . sodium chloride    . dexmedetomidine Stopped (05/11/15 0900)  . epinephrine 4 mcg/min (05/11/15 0800)  . lactated ringers 20 mL/hr at 05/11/15 0800  . lactated ringers 20 mL/hr at 05/11/15 0800  . milrinone 0.375 mcg/kg/min (05/11/15 0800)  . nitroGLYCERIN    . norepinephrine (LEVOPHED) Adult infusion 4 mcg/min (05/11/15 0900)     PRN Medications:  sodium chloride, lactated ringers,  morphine injection, ondansetron (ZOFRAN) IV, oxyCODONE, sodium chloride flush, traMADol   Assessment:   1. Nonischemic cardiomyopathy: Long-standing, EF 20-25%.  Coronary angiography this admission with no significant CAD. Cardiogenic shock requiring milrinone and norepinephrine, IABP placed. Heartmate II LVAD placed 05/10/15.  2. Post-op blood loss anemia: 2 units PRBCs 1/31.  3. Elevated LFTs: Suspect due to CHF.    Plan/Discussion:    Patient extubated today.  Hemodynamics stable on norepinephrine and epinephrine along with milrinone.  Titrating down norepinephrine today.  CVP 12, got significant fluid load yesterday.  - Will Give Lasix 40 mg IV x 1 today.  Titrating down on NO.  Chest tubes remain in place.   Pain is controlled, overall doing well.   I reviewed the LVAD parameters from today, and compared the results to the patient's prior recorded data.  No programming changes were made.  The LVAD is functioning within specified parameters.  The patient performs LVAD self-test daily.  LVAD interrogation was negative for any significant power changes, alarms or PI events/speed drops.  LVAD equipment check completed and is in good working order.  Back-up equipment present.   LVAD education done on emergency procedures and precautions and reviewed exit site care.  Length of Stay: [place] 05/11/2015, 11:15 AM  VAD Team --- VAD ISSUES ONLY--- Pager [phone] (7am - 7am)  Advanced Heart Failure Team  Pager [phone] (M-F; 7a - 4p)  Please contact [facility] for  night-coverage after hours (4p -7a ) and weekends on [email or web address]

## 2015-05-11 NOTE — Progress Notes (Signed)
OT Cancellation Note  Patient Details Name: Patrick Humphrey MRN: [id number] DOB: June 17, 1965   Cancelled Treatment:    Reason Eval/Treat Not Completed: Patient not medically ready Received order. Will assess when medically stable.  [place], OTR/L  [phone] 05/11/2015 05/11/2015, 8:35 AM

## 2015-05-11 NOTE — Progress Notes (Signed)
PT Cancellation Note  Patient Details Name: Patrick Humphrey MRN: [id number] DOB: 1965/09/08   Cancelled Treatment:    Reason Eval/Treat Not Completed: Medical issues which prohibited therapy.  Not ready to start today.  Will start evaluation as able 2/2. 05/11/2015  Donnella Sham, PT [phone]  (pager)   Tawyna Pellot, Tessie Fass 05/11/2015, 2:59 PM

## 2015-05-11 NOTE — Progress Notes (Signed)
Patient ID: Patrick Humphrey, male   DOB: 09-27-1965, 50 y.o.   MRN: [id number] SICU Evening Rounds:  Hemodynamics look good with CI 2.8, SVO2 66%, CVP 10, PA 35/17, MAP 76, Sinus tach 120  On Milrinone 0.375, epi 4, NO1  Pump parameters good with PI 6.1 and flow 5.1  Diuresed some earlier today when CVP up.   Chest tube output ok. Still has air leak from pleural tubes.  He is awake and alert and denies pain. Some anxiety but getting Xanax as needed.  BMET    Component Value Date/Time   NA 135 05/11/2015 1609   K 4.5 05/11/2015 1609   CL 97* 05/11/2015 1609   CO2 24 05/11/2015 0320   GLUCOSE 124* 05/11/2015 1609   BUN 12 05/11/2015 1609   CREATININE 0.80 05/11/2015 1610   CREATININE 0.85 03/09/2015 1105   CALCIUM 7.9* 05/11/2015 0320   GFRNONAA >60 05/11/2015 1610   GFRAA >60 05/11/2015 1610     CBC    Component Value Date/Time   WBC 8.0 05/11/2015 1610   RBC 2.57* 05/11/2015 1610   HGB 8.4* 05/11/2015 1610   HCT 24.1* 05/11/2015 1610   PLT 91* 05/11/2015 1610   MCV 93.8 05/11/2015 1610   MCH 32.7 05/11/2015 1610   MCHC 34.9 05/11/2015 1610   RDW 15.4 05/11/2015 1610   LYMPHSABS 1.3 05/11/2015 0320   MONOABS 0.4 05/11/2015 0320   EOSABS 0.2 05/11/2015 0320   BASOSABS 0.1 05/11/2015 0320    A/P: He looks good. With sinus tach will wean epi to 2 mcg tonight. DC NO. Continue present Milrinone tonight.

## 2015-05-11 NOTE — Progress Notes (Signed)
Changed drive line dressing with patients wife at bedside.  Open to learning and asking appropriate questions.

## 2015-05-11 NOTE — Care Management Important Message (Signed)
Important Message  Patient Details  Name: Patrick Humphrey MRN: [id number] Date of Birth: December 20, 1965   Medicare Important Message Given:  Yes    Diamonique Ruedas Abena 05/11/2015, 1:31 PM

## 2015-05-11 NOTE — Progress Notes (Signed)
Pt with increasing HR to 125; Notified Heart Failure team, M. Tillery PA to bedside.  Instructed to continue to monitor.  If HR increases to 130 or higher obtain EKG call HF team.

## 2015-05-12 ENCOUNTER — Inpatient Hospital Stay (HOSPITAL_COMMUNITY): Payer: Medicare Other

## 2015-05-12 DIAGNOSIS — Z95811 Presence of heart assist device: Secondary | ICD-10-CM

## 2015-05-12 DIAGNOSIS — I5023 Acute on chronic systolic (congestive) heart failure: Secondary | ICD-10-CM

## 2015-05-12 LAB — CARBOXYHEMOGLOBIN
Carboxyhemoglobin: 1.7 % — ABNORMAL HIGH (ref 0.5–1.5)
Methemoglobin: 0.6 % (ref 0.0–1.5)
O2 Saturation: 63 %
Total hemoglobin: 7.8 g/dL — ABNORMAL LOW (ref 13.5–18.0)

## 2015-05-12 LAB — PROTIME-INR
INR: 1.53 — ABNORMAL HIGH (ref 0.00–1.49)
Prothrombin Time: 18.4 seconds — ABNORMAL HIGH (ref 11.6–15.2)

## 2015-05-12 LAB — COMPREHENSIVE METABOLIC PANEL
ALT: 24 U/L (ref 17–63)
AST: 92 U/L — ABNORMAL HIGH (ref 15–41)
Albumin: 2.9 g/dL — ABNORMAL LOW (ref 3.5–5.0)
Alkaline Phosphatase: 38 U/L (ref 38–126)
Anion gap: 9 (ref 5–15)
BUN: 11 mg/dL (ref 6–20)
CO2: 24 mmol/L (ref 22–32)
Calcium: 8.5 mg/dL — ABNORMAL LOW (ref 8.9–10.3)
Chloride: 100 mmol/L — ABNORMAL LOW (ref 101–111)
Creatinine, Ser: 0.82 mg/dL (ref 0.61–1.24)
GFR calc Af Amer: >60 mL/min (ref >60)
GFR calc non Af Amer: >60 mL/min (ref >60)
Glucose, Bld: 99 mg/dL (ref 65–99)
Potassium: 4.4 mmol/L (ref 3.5–5.1)
Sodium: 133 mmol/L — ABNORMAL LOW (ref 135–145)
Total Bilirubin: 12.9 mg/dL — ABNORMAL HIGH (ref 0.3–1.2)
Total Protein: 4.9 g/dL — ABNORMAL LOW (ref 6.5–8.1)

## 2015-05-12 LAB — CBC WITH DIFFERENTIAL/PLATELET
Basophils Absolute: 0.1 10*3/uL (ref 0.0–0.1)
Basophils Relative: 1 %
Eosinophils Absolute: 0.1 10*3/uL (ref 0.0–0.7)
Eosinophils Relative: 1 %
HCT: 24.4 % — ABNORMAL LOW (ref 39.0–52.0)
Hemoglobin: 8.6 g/dL — ABNORMAL LOW (ref 13.0–17.0)
Lymphocytes Relative: 14 %
Lymphs Abs: 1.2 10*3/uL (ref 0.7–4.0)
MCH: 32.6 pg (ref 26.0–34.0)
MCHC: 35.2 g/dL (ref 30.0–36.0)
MCV: 92.4 fL (ref 78.0–100.0)
Monocytes Absolute: 1 10*3/uL (ref 0.1–1.0)
Monocytes Relative: 11 %
Neutro Abs: 6.5 10*3/uL (ref 1.7–7.7)
Neutrophils Relative %: 73 %
Platelets: 89 10*3/uL — ABNORMAL LOW (ref 150–400)
RBC: 2.64 MIL/uL — ABNORMAL LOW (ref 4.22–5.81)
RDW: 15.4 % (ref 11.5–15.5)
WBC: 8.9 10*3/uL (ref 4.0–10.5)

## 2015-05-12 LAB — POCT I-STAT 3, ART BLOOD GAS (G3+)
Acid-Base Excess: 1 mmol/L (ref 0.0–2.0)
Bicarbonate: 25.1 mEq/L — ABNORMAL HIGH (ref 20.0–24.0)
O2 Saturation: 97 %
Patient temperature: 98.6
TCO2: 26 mmol/L (ref 0–100)
pCO2 arterial: 37.1 mmHg (ref 35.0–45.0)
pH, Arterial: 7.437 (ref 7.350–7.450)
pO2, Arterial: 85 mmHg (ref 80.0–100.0)

## 2015-05-12 LAB — GLUCOSE, CAPILLARY
Glucose-Capillary: 102 mg/dL — ABNORMAL HIGH (ref 65–99)
Glucose-Capillary: 75 mg/dL (ref 65–99)
Glucose-Capillary: 76 mg/dL (ref 65–99)
Glucose-Capillary: 92 mg/dL (ref 65–99)
Glucose-Capillary: 98 mg/dL (ref 65–99)

## 2015-05-12 LAB — POCT I-STAT, CHEM 8
BUN: 14 mg/dL (ref 6–20)
Calcium, Ion: 1.16 mmol/L (ref 1.12–1.23)
Chloride: 95 mmol/L — ABNORMAL LOW (ref 101–111)
Creatinine, Ser: 0.8 mg/dL (ref 0.61–1.24)
Glucose, Bld: 116 mg/dL — ABNORMAL HIGH (ref 65–99)
HCT: 29 % — ABNORMAL LOW (ref 39.0–52.0)
Hemoglobin: 9.9 g/dL — ABNORMAL LOW (ref 13.0–17.0)
Potassium: 4.5 mmol/L (ref 3.5–5.1)
Sodium: 132 mmol/L — ABNORMAL LOW (ref 135–145)
TCO2: 23 mmol/L (ref 0–100)

## 2015-05-12 LAB — LACTATE DEHYDROGENASE: LDH: 282 U/L — ABNORMAL HIGH (ref 98–192)

## 2015-05-12 LAB — PHOSPHORUS: Phosphorus: 3.5 mg/dL (ref 2.5–4.6)

## 2015-05-12 LAB — MAGNESIUM: Magnesium: 1.9 mg/dL (ref 1.7–2.4)

## 2015-05-12 MED ORDER — ENSURE ENLIVE PO LIQD: 237.0000 mL | Freq: Two times a day (BID) | ORAL | Status: DC

## 2015-05-12 MED ORDER — WARFARIN SODIUM 2.5 MG PO TABS
2.5000 mg | ORAL_TABLET | Freq: Every day | ORAL | Status: DC
  Administered 2015-05-12: 2.5 mg via ORAL
  Filled 2015-05-12: qty 1

## 2015-05-12 MED ORDER — VANCOMYCIN HCL 10 G IV SOLR
1500.0000 mg | Freq: Two times a day (BID) | INTRAVENOUS | Status: AC
  Administered 2015-05-12 – 2015-05-13 (×3): 1500 mg via INTRAVENOUS
  Filled 2015-05-12 (×4): qty 1500

## 2015-05-12 MED ORDER — FUROSEMIDE 10 MG/ML IJ SOLN
40.0000 mg | Freq: Every day | INTRAMUSCULAR | Status: DC
  Administered 2015-05-12: 40 mg via INTRAVENOUS
  Filled 2015-05-12: qty 4

## 2015-05-12 MED ORDER — WARFARIN - PHYSICIAN DOSING INPATIENT: Freq: Every day | Status: DC

## 2015-05-12 MED ORDER — MILRINONE IN DEXTROSE 20 MG/100ML IV SOLN
0.1250 ug/kg/min | INTRAVENOUS | Status: DC
  Administered 2015-05-12 – 2015-05-13 (×2): 0.25 ug/kg/min via INTRAVENOUS
  Filled 2015-05-12 (×2): qty 100

## 2015-05-12 NOTE — Progress Notes (Signed)
Peripherally Inserted Central Catheter/Midline Placement  The IV Nurse has discussed with the patient and/or persons authorized to consent for the patient, the purpose of this procedure and the potential benefits and risks involved with this procedure.  The benefits include less needle sticks, lab draws from the catheter and patient may be discharged home with the catheter.  Risks include, but not limited to, infection, bleeding, blood clot (thrombus formation), and puncture of an artery; nerve damage and irregular heat beat.  Alternatives to this procedure were also discussed.  PICC/Midline Placement Documentation   UNable toplace PICC.  Able to access vein , but unable to thread catheter without entering the [place]  Will refer to Interventional Radiology  If PICC still desired.      Rolena Infante 05/12/2015, 11:19 AM

## 2015-05-12 NOTE — Evaluation (Addendum)
Physical Therapy Evaluation Patient Details Name: Patrick Humphrey MRN: [id number] DOB: 03-22-1966 Today's Date: 05/12/2015   History of Present Illness  Patient is a 50 yo male with nonischemic cardiomyopathy since 2002 with acute on chronic systolic heart failure and cardiogenic shock, deteriorating on 2 inotropes and had IABP placed. Moderate RV systolic dysfunction. Patient now s/p HeartMate II LVAD.  Clinical Impression  Patient demonstrates deficits in functional mobility as indicated below. Will need continued skilled PT to address deficits and maximize function. Will see as indicated and progress as tolerated.  Patient mobilizing exceptionally well during evaluation. Ambulated in hall on battery support and tolerated well on 2 liters supplemental O2. Modest DOE, HR peak in 140s after increased distance.   Initiated LVAD functional education. Patient able to perform conversion to and from battery support with min assist and verbal cues. Patient performed controller test and battery life test with minimal cues. Patient appears comfortable with equipment and is receptive to all cues.    Follow Up Recommendations Home health PT;Supervision/Assistance - 24 hour (pending progress, may not need HHPT)    Equipment Recommendations   (Rollator)    Recommendations for Other Services       Precautions / Restrictions Precautions Precautions: Sternal (LVAD precautions (Black bag and contents at all times)) Restrictions Weight Bearing Restrictions: Yes (sternal precautions)      Mobility  Bed Mobility Overal bed mobility: Needs Assistance Bed Mobility: Sit to Supine       Sit to supine: Mod assist;+2 for physical assistance   General bed mobility comments: +2 assist for line management, trunk control and LE elevation.  Transfers Overall transfer level: Needs assistance Equipment used: None Transfers: Sit to/from Stand Sit to Stand: Min assist         General transfer  comment: Min assist for stability during elevation to upright. VCs for technique with sternal precautions. 2 attempts to power up. Steady once standing  Ambulation/Gait Ambulation/Gait assistance: Min guard Ambulation Distance (Feet): 90 Feet Assistive device:  (pushing w/c) Gait Pattern/deviations: Step-through pattern;Trunk flexed Gait velocity: decreased Gait velocity interpretation: Below normal speed for age/gender General Gait Details: slow, intentional but steady gait. Flexed postures secondary to weight of LVAD equipment. Elevated HR 140s at peak, decreased with rest (modest DOE)  Financial trader Rankin (Stroke Patients Only)       Balance Overall balance assessment: Needs assistance Sitting-balance support: Feet supported Sitting balance-Leahy Scale: Good     Standing balance support: During functional activity;No upper extremity supported Standing balance-Leahy Scale: Fair Standing balance comment: modest dizziness in standing                             Pertinent Vitals/Pain Pain Assessment: Faces Faces Pain Scale: Hurts little more Pain Location: surgical site Pain Descriptors / Indicators: Grimacing;Guarding Pain Intervention(s): Limited activity within patient's tolerance;Monitored during session;Repositioned    Home Living Family/patient expects to be discharged to:: Private residence Living Arrangements: Spouse/significant other Available Help at Discharge: Family                  Prior Function Level of Independence: Independent               Hand Dominance        Extremity/Trunk Assessment   Upper Extremity Assessment: Defer to OT evaluation (generalized weakness with finger dexterity)  Lower Extremity Assessment: Generalized weakness;Overall WFL for tasks assessed         Communication   Communication: No difficulties  Cognition Arousal/Alertness:  Awake/alert Behavior During Therapy: WFL for tasks assessed/performed Overall Cognitive Status: Within Functional Limits for tasks assessed                      General Comments General comments (skin integrity, edema, etc.): LVAD education initiated. Patient recpetive and tolerated well.     Exercises        Assessment/Plan    PT Assessment Patient needs continued PT services  PT Diagnosis Difficulty walking;Abnormality of gait;Generalized weakness;Acute pain   PT Problem List Decreased strength;Decreased range of motion;Decreased activity tolerance;Decreased balance;Decreased mobility;Cardiopulmonary status limiting activity;Pain  PT Treatment Interventions DME instruction;Gait training;Stair training;Functional mobility training;Therapeutic activities;Therapeutic exercise;Balance training;Patient/family education   PT Goals (Current goals can be found in the Care Plan section) Acute Rehab PT Goals Patient Stated Goal: to go home PT Goal Formulation: With patient Time For Goal Achievement: 05/26/15 Potential to Achieve Goals: Good    Frequency Min 4X/week   Barriers to discharge        Co-evaluation               End of Session Equipment Utilized During Treatment: Oxygen (2 liters [place]) Activity Tolerance: Patient tolerated treatment well Patient left: in bed;with call bell/phone within reach Nurse Communication: Mobility status;Precautions         Time: [id number] PT Time Calculation (min) (ACUTE ONLY): 32 min   Charges:   PT Evaluation $PT Eval High Complexity: 1 Procedure PT Treatments $Gait Training: 8-22 mins   PT G CodesDuncan Dull 05-17-15, 4:00 PM Alben Deeds, [place] DPT  [phone]

## 2015-05-12 NOTE — Progress Notes (Addendum)
Patient ID: Patrick Humphrey, male   DOB: 1966/01/01, 50 y.o.   MRN: [id number] HeartMate 2 Rounding Note  Subjective:    LVAD placement 05/10/15.   Doing well post-op, extubated 2/1.  Awake/alert, some soreness at surgical site.  Got 2 units PRBCs 1/31, hemoglobin stable today.  Chest tubes remain in place.  MAP 80s today.    Titrated off epinephrine, remains on milrinone 0.375.  Sinus tachy to 120 with some ectopy.   Swan: CVP 11 PA 38/17 CI 3  Co-ox 63%  LVAD INTERROGATION:  HeartMate II LVAD:  Flow 4.6 liters/min, speed 9000, power 4.9, PI 7.5.  3 PI events.   Objective:    Vital Signs:   Temp:  [97.2 F (36.2 C)-99.3 F (37.4 C)] 97.3 F (36.3 C) (02/02 0800) Pulse Rate:  [82-123] 119 (02/02 0800) Resp:  [0-33] 0 (02/02 0800) SpO2:  [92 %-100 %] 98 % (02/02 0800) Arterial Line BP: (74-110)/(3-84) 89/74 mmHg (02/02 0800) Weight:  [177 lb 4 oz (80.4 kg)] 177 lb 4 oz (80.4 kg) (02/02 0500) Last BM Date: 05/06/15 Mean arterial Pressure 70s  Intake/Output:   Intake/Output Summary (Last 24 hours) at 05/12/15 0946 Last data filed at 05/12/15 0700  Gross per 24 hour  Intake 3246.7 ml  Output   2775 ml  Net  471.7 ml     Physical Exam: General:  Well appearing. No resp difficulty HEENT: normal Neck: supple. JVP 10. Carotids 2+ bilat; no bruits. No lymphadenopathy or thryomegaly appreciated. Cor: Mechanical heart sounds with LVAD hum present. Lungs: clear Abdomen: soft, nontender, nondistended. No hepatosplenomegaly. No bruits or masses. Good bowel sounds. Driveline: C/D/I; securement device intact and driveline incorporated Extremities: no cyanosis, clubbing, rash, edema Neuro: alert & orientedx3, cranial nerves grossly intact. moves all 4 extremities w/o difficulty. Affect pleasant  Telemetry: sinus tachy around 120 with PVCs  Labs: Basic Metabolic Panel:  Recent Labs Lab 05/09/15 0500 05/10/15 0200  05/10/15 1520 05/10/15 2000 05/10/15 2018  05/11/15 0320 05/11/15 1609 05/11/15 1610 05/12/15 0340  NA 131* 127*  < > 137  --  137 136 135  --  133*  K 4.2 3.6  < > 3.5  --  3.0* 4.5 4.5  --  4.4  CL 89* 84*  < > 100*  --  97* 102 97*  --  100*  CO2 28 31  --  27  --   --  24  --   --  24  GLUCOSE 137* 216*  < > 120*  --  85 85 124*  --  99  BUN 22* 20  < > 13  --  14 13 12   --  11  CREATININE 1.28* 1.04  < > 0.91 0.92 0.70 0.77 0.70 0.80 0.82  CALCIUM 8.9 8.4*  --  7.7*  --   --  7.9*  --   --  8.5*  MG  --   --   --  1.8 2.9*  --  2.3  --  2.0 1.9  PHOS  --   --   --   --   --   --  2.6  --   --  3.5  < > = values in this interval not displayed.  Liver Function Tests:  Recent Labs Lab 05/09/15 0500 05/11/15 0320 05/12/15 0340  AST 68* 84* 92*  ALT 38 24 24  ALKPHOS 81 37* 38  BILITOT 3.5* 9.2* 12.9*  PROT 6.5 4.8* 4.9*  ALBUMIN 3.0* 3.1* 2.9*  No results for input(s): LIPASE, AMYLASE in the last 168 hours. No results for input(s): AMMONIA in the last 168 hours.  CBC:  Recent Labs Lab 05/10/15 1520 05/10/15 2000 05/10/15 2018 05/11/15 0320 05/11/15 1609 05/11/15 1610 05/12/15 0340  WBC 6.4 5.4  --  5.6  --  8.0 8.9  NEUTROABS  --   --   --  3.7  --   --  6.5  HGB 11.2* 8.0* 8.2* 9.6* 8.8* 8.4* 8.6*  HCT 32.6* 22.8* 24.0* 28.2* 26.0* 24.1* 24.4*  MCV 93.7 94.6  --  93.1  --  93.8 92.4  PLT 79* 99*  --  92*  --  91* 89*    INR:  Recent Labs Lab 05/10/15 0200 05/10/15 1445 05/10/15 1520 05/11/15 0320 05/12/15 0340  INR 1.19 1.33 1.35 1.57* 1.53*    Other results:  EKG:   Imaging: Dg Chest Port 1 View  05/12/2015  CLINICAL DATA:  Left ventricular assist device EXAM: PORTABLE CHEST 1 VIEW COMPARISON:  05/11/2015. FINDINGS: Left ventricular assist device in satisfactory position, unchanged. Swan-Ganz catheter in the right main pulmonary artery. Bilateral chest tubes. No pneumothorax. AICD in good position and unchanged. Partial clearing of interstitial edema. Bibasilar atelectasis and  effusions also show mild interval improvement. IMPRESSION: Improving interstitial edema. Improved aeration of the lung bases. Electronically Signed   By: Franchot Gallo M.D.   On: 05/12/2015 07:44   Dg Chest Port 1 View  05/11/2015  CLINICAL DATA:  Left ventricular assist device. EXAM: PORTABLE CHEST 1 VIEW COMPARISON:  05/10/2015. FINDINGS: Endotracheal tube, NG tube, Swan-Ganz catheter, left chest tube in stable position. Cardiac pacer in stable position. Left ventricular assist device in stable position were visualized. Prior median sternotomy. Cardiomegaly with bilateral from interstitial prominence and pleural effusions consistent with congestive heart failure. These findings are new from prior exam . No pneumothorax. IMPRESSION: 1. Lines and tubes including left chest tube in stable position. No pneumothorax. 2. Cardiac pacer and left ventricular assist device again noted. Cardiomegaly with new onset bilateral pulmonary interstitial edema and small pleural effusions. Electronically Signed   By: Marcello Moores  Register   On: 05/11/2015 07:23   Dg Chest Port 1 View  05/10/2015  CLINICAL DATA:  Status post left ventricular assistance device placement. EXAM: PORTABLE CHEST 1 VIEW COMPARISON:  Same day. FINDINGS: There is been interval placement of left ventricular assistance device. Endotracheal tube is seen projected over tracheal air shadow with distal tip 7 cm above the carina. Nasogastric tube is seen entering stomach. Bilateral chest tubes are noted without definite pneumothorax. Right internal jugular Swan-Ganz catheter is noted with tip in expected position of main pulmonary artery. No significant pleural effusion is noted. Right-sided pacemaker is again noted and unchanged in position. Distal tip of aortic balloon catheter is seen unchanged in position at the level of aortic arch. Pulmonary edema noted on prior exam appears to be improved. IMPRESSION: Interval placement of left ventricular assistance device.  Bilateral chest tubes are noted without pneumothorax. Endotracheal and nasogastric tubes appear to be in grossly good position. Right internal jugular Swan-Ganz catheter tip is in expected position of main pulmonary artery. Electronically Signed   By: Marijo Conception, M.D.   On: 05/10/2015 15:33     Medications:     Scheduled Medications: . acetaminophen  1,000 mg Oral 4 times per day   Or  . acetaminophen (TYLENOL) oral liquid 160 mg/5 mL  1,000 mg Per Tube 4 times per day  . antiseptic  oral rinse  7 mL Mouth Rinse BID  . aspirin EC  325 mg Oral Daily   Or  . aspirin  324 mg Per Tube Daily   Or  . aspirin  300 mg Rectal Daily  . bisacodyl  10 mg Oral Daily   Or  . bisacodyl  10 mg Rectal Daily  . docusate sodium  200 mg Oral Daily  . furosemide  40 mg Intravenous Daily  . insulin aspart  0-24 Units Subcutaneous 6 times per day  . metoCLOPramide (REGLAN) injection  10 mg Intravenous 4 times per day  . pantoprazole  40 mg Oral Daily  . sodium chloride flush  3 mL Intravenous Q12H  . vancomycin  1,500 mg Intravenous Q12H  . warfarin  2.5 mg Oral q1800  . Warfarin - Physician Dosing Inpatient   Does not apply q1800    Infusions: . sodium chloride 20 mL/hr at 05/12/15 0700  . sodium chloride 250 mL (05/11/15 0606)  . sodium chloride    . epinephrine 2 mcg/min (05/12/15 0700)  . lactated ringers 20 mL/hr at 05/12/15 0700  . lactated ringers 20 mL/hr at 05/12/15 0700  . milrinone      PRN Medications: sodium chloride, ALPRAZolam, morphine injection, ondansetron (ZOFRAN) IV, oxyCODONE, sodium chloride flush, traMADol   Assessment:   1. Nonischemic cardiomyopathy: Long-standing, EF 20-25%.  Coronary angiography this admission with no significant CAD. Cardiogenic shock requiring milrinone and norepinephrine, IABP placed. Heartmate II LVAD placed 05/10/15.  2. Post-op blood loss anemia: 2 units PRBCs 1/31.  3. Elevated LFTs: Suspect due to CHF.    Plan/Discussion:    Doing  well today, MAP 80s and now off epinephrine.  On milrinone 0.375.  Good cardiac output.  He is in sinus tachy around 120.  - With good output and tachycardia/ectopy, will decrease milrinone to 0.25.  Repeat co-ox this afternoon. - CVP elevated, to get IV Lasix.  - a-line and Luiz Blare out today.   Chest tubes remain in place.   Pain is controlled, overall doing well.   I reviewed the LVAD parameters from today, and compared the results to the patient's prior recorded data.  No programming changes were made.  The LVAD is functioning within specified parameters.  The patient performs LVAD self-test daily.  LVAD interrogation was negative for any significant power changes, alarms or PI events/speed drops.  LVAD equipment check completed and is in good working order.  Back-up equipment present.   LVAD education done on emergency procedures and precautions and reviewed exit site care.  Length of Stay: [place] 05/12/2015, 9:46 AM  VAD Team --- VAD ISSUES ONLY--- Pager [phone] (7am - 7am)  Advanced Heart Failure Team  Pager [phone] (M-F; 7a - 4p)  Please contact [facility] for night-coverage after hours (4p -7a ) and weekends on [email or web address]

## 2015-05-12 NOTE — Progress Notes (Signed)
Mr. Dettling is sitting up in chair. He appears to be doing well but expresses some frustration over unsuccessful PICC line placement. He says that they tried for 2 hours. He is having some anxiety and trouble sleeping. Discussed some relaxing techniques and encouraged him to utilize his music to help him relax. Consider increasing Xanax - at least increased dose for qhs.   Vinie Sill, NP Palliative Medicine Team Pager # [phone] (M-F 8a-5p) Team Phone # [phone] (Nights/Weekends)

## 2015-05-12 NOTE — Progress Notes (Addendum)
HeartMate 2 Rounding Note     Postop day #2 HeartMate 2 LVAD implantation   Subjective:   History of nonischemic cardiomyopathy, ejection fraction 15% Preoperative cardiogenic shock on balloon pump and dual inotropes Preoperative protein deficiency malnutrition with prealbumin 8.4 Preoperative moderate RV dysfunction Preoperative hepatic insufficiency with bilirubin of 4.2  Status post AICD implantation 2002  The patient has progressed first 48 hours after surgery. He was extubated postop day 1. He ambulated in the hallway today postop day 2. His VAD parameters have remained satisfactory. He has weaned off nitric oxide as well as epinephrine with stable RV performance. He is maintaining a sinus rhythm.  Chest tube drainage remains significant but serous and the left pleural, mediastinal, and pocket drains remain. He is diuresing fairly well and has lost fluid weight daily. Pain is well-controlled and neuro status is stable INR today is 1.4, Coumadin ordered His postop bilirubin has increased 4.2 >> 8>>`12 probably due to shock liver preop   LVAD INTERROGATION:  HeartMate II LVAD:  Flow 4.9 liters/min, speed 9000, power 5.2 PI 6.2  Controller Serial intact   Objective:    Vital Signs:   Temp:  [97.2 F (36.2 C)-98.8 F (37.1 C)] 98.1 F (36.7 C) (02/02 1156) Pulse Rate:  [88-125] 122 (02/02 1300) Resp:  [0-28] 26 (02/02 1300) SpO2:  [95 %-100 %] 99 % (02/02 1300) Arterial Line BP: (77-110)/(67-90) 97/78 mmHg (02/02 1300) Weight:  [177 lb 4 oz (80.4 kg)] 177 lb 4 oz (80.4 kg) (02/02 0500) Last BM Date: 05/06/15 Mean arterial Pressure  75-80  Intake/Output:   Intake/Output Summary (Last 24 hours) at 05/12/15 1650 Last data filed at 05/12/15 1600  Gross per 24 hour  Intake 3629.5 ml  Output   2425 ml  Net 1204.5 ml     Physical Exam: General:  Well appearing. No resp difficulty HEENT: normal Neck: supple. JVP . Carotids 2+ bilat; no bruits. No lymphadenopathy or  thryomegaly appreciated. Cor: Mechanical heart sounds with LVAD hum present. Lungs: clear Abdomen: soft, nontender, nondistended. No hepatosplenomegaly. No bruits or masses. Good bowel sounds. Extremities: no cyanosis, clubbing, rash, edema Neuro: alert & orientedx3, cranial nerves grossly intact. moves all 4 extremities w/o difficulty. Affect pleasant  Telemetry: sinus tach  Labs: Basic Metabolic Panel:  Recent Labs Lab 05/09/15 0500 05/10/15 0200  05/10/15 1520 05/10/15 2000 05/10/15 2018 05/11/15 0320 05/11/15 1609 05/11/15 1610 05/12/15 0340 05/12/15 1612  NA 131* 127*  < > 137  --  137 136 135  --  133* 132*  K 4.2 3.6  < > 3.5  --  3.0* 4.5 4.5  --  4.4 4.5  CL 89* 84*  < > 100*  --  97* 102 97*  --  100* 95*  CO2 28 31  --  27  --   --  24  --   --  24  --   GLUCOSE 137* 216*  < > 120*  --  85 85 124*  --  99 116*  BUN 22* 20  < > 13  --  14 13 12   --  11 14  CREATININE 1.28* 1.04  < > 0.91 0.92 0.70 0.77 0.70 0.80 0.82 0.80  CALCIUM 8.9 8.4*  --  7.7*  --   --  7.9*  --   --  8.5*  --   MG  --   --   --  1.8 2.9*  --  2.3  --  2.0 1.9  --  PHOS  --   --   --   --   --   --  2.6  --   --  3.5  --   < > = values in this interval not displayed.  Liver Function Tests:  Recent Labs Lab 05/09/15 0500 05/11/15 0320 05/12/15 0340  AST 68* 84* 92*  ALT 38 24 24  ALKPHOS 81 37* 38  BILITOT 3.5* 9.2* 12.9*  PROT 6.5 4.8* 4.9*  ALBUMIN 3.0* 3.1* 2.9*   No results for input(s): LIPASE, AMYLASE in the last 168 hours. No results for input(s): AMMONIA in the last 168 hours.  CBC:  Recent Labs Lab 05/10/15 1520 05/10/15 2000  05/11/15 0320 05/11/15 1609 05/11/15 1610 05/12/15 0340 05/12/15 1612  WBC 6.4 5.4  --  5.6  --  8.0 8.9  --   NEUTROABS  --   --   --  3.7  --   --  6.5  --   HGB 11.2* 8.0*  < > 9.6* 8.8* 8.4* 8.6* 9.9*  HCT 32.6* 22.8*  < > 28.2* 26.0* 24.1* 24.4* 29.0*  MCV 93.7 94.6  --  93.1  --  93.8 92.4  --   PLT 79* 99*  --  92*  --  91* 89*   --   < > = values in this interval not displayed.  INR:  Recent Labs Lab 05/10/15 0200 05/10/15 1445 05/10/15 1520 05/11/15 0320 05/12/15 0340  INR 1.19 1.33 1.35 1.57* 1.53*    Other results:  EKG:   Imaging: Dg Chest Port 1 View  05/12/2015  CLINICAL DATA:  Left ventricular assist device EXAM: PORTABLE CHEST 1 VIEW COMPARISON:  05/11/2015. FINDINGS: Left ventricular assist device in satisfactory position, unchanged. Swan-Ganz catheter in the right main pulmonary artery. Bilateral chest tubes. No pneumothorax. AICD in good position and unchanged. Partial clearing of interstitial edema. Bibasilar atelectasis and effusions also show mild interval improvement. IMPRESSION: Improving interstitial edema. Improved aeration of the lung bases. Electronically Signed   By: Franchot Gallo M.D.   On: 05/12/2015 07:44   Dg Chest Port 1 View  05/11/2015  CLINICAL DATA:  Left ventricular assist device. EXAM: PORTABLE CHEST 1 VIEW COMPARISON:  05/10/2015. FINDINGS: Endotracheal tube, NG tube, Swan-Ganz catheter, left chest tube in stable position. Cardiac pacer in stable position. Left ventricular assist device in stable position were visualized. Prior median sternotomy. Cardiomegaly with bilateral from interstitial prominence and pleural effusions consistent with congestive heart failure. These findings are new from prior exam . No pneumothorax. IMPRESSION: 1. Lines and tubes including left chest tube in stable position. No pneumothorax. 2. Cardiac pacer and left ventricular assist device again noted. Cardiomegaly with new onset bilateral pulmonary interstitial edema and small pleural effusions. Electronically Signed   By: Marcello Moores  Register   On: 05/11/2015 07:23      Medications:     Scheduled Medications: . acetaminophen  1,000 mg Oral 4 times per day   Or  . acetaminophen (TYLENOL) oral liquid 160 mg/5 mL  1,000 mg Per Tube 4 times per day  . antiseptic oral rinse  7 mL Mouth Rinse BID  .  aspirin EC  325 mg Oral Daily   Or  . aspirin  324 mg Per Tube Daily   Or  . aspirin  300 mg Rectal Daily  . bisacodyl  10 mg Oral Daily   Or  . bisacodyl  10 mg Rectal Daily  . docusate sodium  200 mg Oral Daily  . [  START ON 05/13/2015] feeding supplement (ENSURE ENLIVE)  237 mL Oral BID BM  . furosemide  40 mg Intravenous Daily  . insulin aspart  0-24 Units Subcutaneous 6 times per day  . metoCLOPramide (REGLAN) injection  10 mg Intravenous 4 times per day  . pantoprazole  40 mg Oral Daily  . sodium chloride flush  3 mL Intravenous Q12H  . vancomycin  1,500 mg Intravenous Q12H  . warfarin  2.5 mg Oral q1800  . Warfarin - Physician Dosing Inpatient   Does not apply q1800     Infusions: . sodium chloride 20 mL/hr at 05/12/15 0700  . sodium chloride 250 mL (05/11/15 0606)  . sodium chloride    . epinephrine Stopped (05/12/15 1000)  . lactated ringers 20 mL/hr at 05/12/15 0700  . lactated ringers 10 mL (05/12/15 1631)  . milrinone 0.25 mcg/kg/min (05/12/15 1631)     PRN Medications:  sodium chloride, ALPRAZolam, morphine injection, ondansetron (ZOFRAN) IV, oxyCODONE, sodium chloride flush, traMADol   Assessment:  Left subclavian triple-lumen catheter placed today after attempt at PICC line was unsuccessful Small intermittent air leak from left pleural chest tube Right pleural chest tube was removed Advancing diet Coumadin 2.5 mg ordered-- hepatic insufficiency may affect dosing   Plan/Discussion:   Continue diuresis Advance diet, maximize protein intake   Follow bilirubin Continue with physical therapy   I reviewed the LVAD parameters from today, and compared the results to the patient's prior recorded data.  No programming changes were made.  The LVAD is functioning within specified parameters.  The patient performs LVAD self-test daily.  LVAD interrogation was negative for any significant power changes, alarms or PI events/speed drops.  LVAD equipment check completed  and is in good working order.  Back-up equipment present.   LVAD education done on emergency procedures and precautions and reviewed exit site care.  Length of Stay: [place] 05/12/2015, 4:50 PM

## 2015-05-12 NOTE — CV Procedure (Signed)
Central line placement  L subclavian TLC placed under sterile prep and drape CXR pending

## 2015-05-12 NOTE — Progress Notes (Signed)
CSW met patient and wife at bedside. Patient reports he walked the unit this afternoon and making progress. Wife states she will have discharge teaching next Tuesday and is excited with the progress and talk of potential discharge home. CSW provided supportive intervention and will continue to follow throughout implant hospitalization. Raquel Sarna, [place]

## 2015-05-13 LAB — CBC WITH DIFFERENTIAL/PLATELET
Basophils Absolute: 0.1 10*3/uL (ref 0.0–0.1)
Basophils Relative: 1 %
Eosinophils Absolute: 0.3 10*3/uL (ref 0.0–0.7)
Eosinophils Relative: 3 %
HCT: 23.9 % — ABNORMAL LOW (ref 39.0–52.0)
Hemoglobin: 8.2 g/dL — ABNORMAL LOW (ref 13.0–17.0)
Lymphocytes Relative: 12 %
Lymphs Abs: 1 10*3/uL (ref 0.7–4.0)
MCH: 32.4 pg (ref 26.0–34.0)
MCHC: 34.3 g/dL (ref 30.0–36.0)
MCV: 94.5 fL (ref 78.0–100.0)
Monocytes Absolute: 0.8 10*3/uL (ref 0.1–1.0)
Monocytes Relative: 10 %
Neutro Abs: 6.2 10*3/uL (ref 1.7–7.7)
Neutrophils Relative %: 74 %
Platelets: 100 10*3/uL — ABNORMAL LOW (ref 150–400)
RBC: 2.53 MIL/uL — ABNORMAL LOW (ref 4.22–5.81)
RDW: 15.8 % — ABNORMAL HIGH (ref 11.5–15.5)
WBC: 8.3 10*3/uL (ref 4.0–10.5)

## 2015-05-13 LAB — TYPE AND SCREEN
ABO/RH(D): A POS
Antibody Screen: NEGATIVE
Unit division: 0
Unit division: 0
Unit division: 0
Unit division: 0

## 2015-05-13 LAB — GLUCOSE, CAPILLARY
Glucose-Capillary: 112 mg/dL — ABNORMAL HIGH (ref 65–99)
Glucose-Capillary: 117 mg/dL — ABNORMAL HIGH (ref 65–99)
Glucose-Capillary: 68 mg/dL (ref 65–99)
Glucose-Capillary: 79 mg/dL (ref 65–99)
Glucose-Capillary: 84 mg/dL (ref 65–99)
Glucose-Capillary: 86 mg/dL (ref 65–99)

## 2015-05-13 LAB — CARBOXYHEMOGLOBIN
Carboxyhemoglobin: 1.7 % — ABNORMAL HIGH (ref 0.5–1.5)
Carboxyhemoglobin: 1.5 % (ref 0.5–1.5)
Methemoglobin: 0.6 % (ref 0.0–1.5)
Methemoglobin: 0.7 % (ref 0.0–1.5)
O2 Saturation: 68.3 %
O2 Saturation: 63.4 %
Total hemoglobin: 7.2 g/dL — ABNORMAL LOW (ref 13.5–18.0)
Total hemoglobin: 8.8 g/dL — ABNORMAL LOW (ref 13.5–18.0)

## 2015-05-13 LAB — PHOSPHORUS: Phosphorus: 2.8 mg/dL (ref 2.5–4.6)

## 2015-05-13 LAB — POCT I-STAT, CHEM 8
BUN: 17 mg/dL (ref 6–20)
Calcium, Ion: 1.18 mmol/L (ref 1.12–1.23)
Chloride: 97 mmol/L — ABNORMAL LOW (ref 101–111)
Creatinine, Ser: 0.8 mg/dL (ref 0.61–1.24)
Glucose, Bld: 122 mg/dL — ABNORMAL HIGH (ref 65–99)
HCT: 28 % — ABNORMAL LOW (ref 39.0–52.0)
Hemoglobin: 9.5 g/dL — ABNORMAL LOW (ref 13.0–17.0)
Potassium: 4.3 mmol/L (ref 3.5–5.1)
Sodium: 127 mmol/L — ABNORMAL LOW (ref 135–145)
TCO2: 23 mmol/L (ref 0–100)

## 2015-05-13 LAB — COMPREHENSIVE METABOLIC PANEL
ALT: 25 U/L (ref 17–63)
AST: 64 U/L — ABNORMAL HIGH (ref 15–41)
Albumin: 2.5 g/dL — ABNORMAL LOW (ref 3.5–5.0)
Alkaline Phosphatase: 48 U/L (ref 38–126)
Anion gap: 8 (ref 5–15)
BUN: 13 mg/dL (ref 6–20)
CO2: 22 mmol/L (ref 22–32)
Calcium: 7.9 mg/dL — ABNORMAL LOW (ref 8.9–10.3)
Chloride: 103 mmol/L (ref 101–111)
Creatinine, Ser: 0.82 mg/dL (ref 0.61–1.24)
GFR calc Af Amer: >60 mL/min (ref >60)
GFR calc non Af Amer: >60 mL/min (ref >60)
Glucose, Bld: 79 mg/dL (ref 65–99)
Potassium: 4 mmol/L (ref 3.5–5.1)
Sodium: 133 mmol/L — ABNORMAL LOW (ref 135–145)
Total Bilirubin: 9.8 mg/dL — ABNORMAL HIGH (ref 0.3–1.2)
Total Protein: 4.9 g/dL — ABNORMAL LOW (ref 6.5–8.1)

## 2015-05-13 LAB — LACTATE DEHYDROGENASE: LDH: 276 U/L — ABNORMAL HIGH (ref 98–192)

## 2015-05-13 LAB — PROTIME-INR
INR: 1.33 (ref 0.00–1.49)
Prothrombin Time: 16.6 seconds — ABNORMAL HIGH (ref 11.6–15.2)

## 2015-05-13 LAB — MAGNESIUM: Magnesium: 1.6 mg/dL — ABNORMAL LOW (ref 1.7–2.4)

## 2015-05-13 MED ORDER — POTASSIUM CHLORIDE 10 MEQ/50ML IV SOLN
10.0000 meq | INTRAVENOUS | Status: AC
Start: 2015-05-13 — End: 2015-05-13
  Administered 2015-05-13 (×2): 10 meq via INTRAVENOUS
  Filled 2015-05-13 (×2): qty 50

## 2015-05-13 MED ORDER — AMIODARONE HCL IN DEXTROSE 360-4.14 MG/200ML-% IV SOLN
INTRAVENOUS | Status: AC
  Filled 2015-05-13: qty 400

## 2015-05-13 MED ORDER — HYDROCOD POLST-CPM POLST ER 10-8 MG/5ML PO SUER
5.0000 mL | Freq: Every day | ORAL | Status: DC
  Administered 2015-05-13 – 2015-05-23 (×11): 5 mL via ORAL
  Filled 2015-05-13 (×11): qty 5

## 2015-05-13 MED ORDER — INSULIN ASPART 100 UNIT/ML ~~LOC~~ SOLN: 0.0000 [IU] | Freq: Three times a day (TID) | SUBCUTANEOUS | Status: DC

## 2015-05-13 MED ORDER — HYDRALAZINE HCL 20 MG/ML IJ SOLN
10.0000 mg | INTRAMUSCULAR | Status: DC | PRN
  Administered 2015-05-14 – 2015-05-15 (×2): 10 mg via INTRAVENOUS
  Filled 2015-05-13 (×2): qty 1

## 2015-05-13 MED ORDER — INSULIN ASPART 100 UNIT/ML ~~LOC~~ SOLN: 0.0000 [IU] | Freq: Every day | SUBCUTANEOUS | Status: DC

## 2015-05-13 MED ORDER — FUROSEMIDE 10 MG/ML IJ SOLN
40.0000 mg | Freq: Two times a day (BID) | INTRAMUSCULAR | Status: DC
  Administered 2015-05-13 – 2015-05-14 (×3): 40 mg via INTRAVENOUS
  Filled 2015-05-13 (×3): qty 4

## 2015-05-13 MED ORDER — WARFARIN SODIUM 4 MG PO TABS
4.0000 mg | ORAL_TABLET | Freq: Every day | ORAL | Status: DC
  Administered 2015-05-13: 4 mg via ORAL
  Filled 2015-05-13 (×2): qty 1

## 2015-05-13 MED ORDER — AMIODARONE LOAD VIA INFUSION
150.0000 mg | Freq: Once | INTRAVENOUS | Status: AC
  Administered 2015-05-13: 150 mg via INTRAVENOUS
  Filled 2015-05-13: qty 83.34

## 2015-05-13 MED ORDER — DIGOXIN 125 MCG PO TABS
0.1250 mg | ORAL_TABLET | Freq: Every day | ORAL | Status: DC
  Administered 2015-05-14 – 2015-05-30 (×17): 0.125 mg via ORAL
  Filled 2015-05-13 (×17): qty 1

## 2015-05-13 MED ORDER — ENSURE ENLIVE PO LIQD
237.0000 mL | Freq: Every day | ORAL | Status: DC
  Administered 2015-05-14: 237 mL via ORAL

## 2015-05-13 MED ORDER — AMIODARONE HCL IN DEXTROSE 360-4.14 MG/200ML-% IV SOLN
60.0000 mg/h | INTRAVENOUS | Status: AC
  Administered 2015-05-13: 60 mg/h via INTRAVENOUS
  Filled 2015-05-13: qty 200

## 2015-05-13 MED ORDER — LIDOCAINE HCL (PF) 1 % IJ SOLN
INTRAMUSCULAR | Status: AC
  Administered 2015-05-13: 10:00:00
  Filled 2015-05-13: qty 5

## 2015-05-13 MED ORDER — AMIODARONE HCL IN DEXTROSE 360-4.14 MG/200ML-% IV SOLN
30.0000 mg/h | INTRAVENOUS | Status: DC
  Administered 2015-05-14 (×2): 30 mg/h via INTRAVENOUS
  Filled 2015-05-13 (×2): qty 200

## 2015-05-13 MED ORDER — CITALOPRAM HYDROBROMIDE 20 MG PO TABS
20.0000 mg | ORAL_TABLET | Freq: Every day | ORAL | Status: DC
  Administered 2015-05-13 – 2015-05-27 (×15): 20 mg via ORAL
  Filled 2015-05-13 (×15): qty 1

## 2015-05-13 MED ORDER — GUAIFENESIN ER 600 MG PO TB12
600.0000 mg | ORAL_TABLET | Freq: Two times a day (BID) | ORAL | Status: DC
  Administered 2015-05-13 – 2015-05-30 (×31): 600 mg via ORAL
  Filled 2015-05-13 (×34): qty 1

## 2015-05-13 MED ORDER — FE FUMARATE-B12-VIT C-FA-IFC PO CAPS
1.0000 | ORAL_CAPSULE | Freq: Three times a day (TID) | ORAL | Status: DC
  Administered 2015-05-13 – 2015-05-24 (×23): 1 via ORAL
  Filled 2015-05-13 (×26): qty 1

## 2015-05-13 MED ORDER — BOOST / RESOURCE BREEZE PO LIQD: 1.0000 | Freq: Every day | ORAL | Status: DC

## 2015-05-13 MED ORDER — DIGOXIN 125 MCG PO TABS
0.2500 mg | ORAL_TABLET | Freq: Once | ORAL | Status: AC
  Administered 2015-05-13: 0.25 mg via ORAL
  Filled 2015-05-13: qty 2

## 2015-05-13 MED FILL — Phenylephrine HCl Inj 10 MG/ML: INTRAMUSCULAR | Qty: 1 | Status: AC

## 2015-05-13 MED FILL — Dextrose Inj 5%: INTRAVENOUS | Qty: 250 | Status: AC

## 2015-05-13 MED FILL — Sodium Chloride IV Soln 0.9%: INTRAVENOUS | Qty: 250 | Status: AC

## 2015-05-13 MED FILL — Potassium Chloride Inj 2 mEq/ML: INTRAVENOUS | Qty: 40 | Status: AC

## 2015-05-13 MED FILL — Vasopressin Inj 20 Unit/ML: INTRAMUSCULAR | Qty: 3 | Status: AC

## 2015-05-13 MED FILL — Cefuroxime Sodium For Inj 1.5 GM: INTRAMUSCULAR | Qty: 1.5 | Status: AC

## 2015-05-13 MED FILL — Magnesium Sulfate Inj 50%: INTRAMUSCULAR | Qty: 10 | Status: AC

## 2015-05-13 MED FILL — Heparin Sodium (Porcine) Inj 1000 Unit/ML: INTRAMUSCULAR | Qty: 30 | Status: AC

## 2015-05-13 MED FILL — Cefuroxime Sodium For Inj 750 MG: INTRAMUSCULAR | Qty: 750 | Status: AC

## 2015-05-13 NOTE — Progress Notes (Signed)
Admitted 04/29/15 due to acute on chronic systolic CHF.   HeartMate II LVAD implated on 05/10/15 by Dr. Darcey Nora.   Vital signs: Temp:  97.8 HR: 114 - 132 Doppler MAP: 80's O2 Sat:  95 - 99%  Wt in lbs:  160 > 179 > 177 > 178   LVAD interrogation reveals:  Speed:  8970 Flow:  4.8 Power:  5.0 PI:  7.7 Alarms:  none Events:  3 PI events  Fixed speed:  9000 Low speed limit: 8400  Drive Line:  Dressing intact with serous drainage noted from CT site above. Dr. Darcey Nora in and placed stitch in CT site. Wife performed dressing change to VAD exit site under supervision; did very well with sterile technique. Site with two sutures intact, small amount serous drainage with no foul odor or tenderness noted. Dressing being changed daily using daily dressing kit with aquacel silver strip on exit site.     Telemetry:  HR 130's; EKG ordered. Atrial flutter per Dr. Darcey Nora; attempted to pace terminate without success. Amiodarone bolus and gtt ordered.   Labs:  LDH trend:  247 > 282 > 276  INR trend:  1.57 > 1.53 > 1.33  Hgb:  11.2 > 8.0 > 9.6 > 8.4 > 8.2  Blood products: Intra op:  6 units FFP Post Op:   05/10/15 ---> 2 units PC, 1 unit plts   Gtts: Milrinone 0.375 ---> 0.25 on 05/12/15 Amiodarone --->  started 05/13/15 for A flutter Levophed ---> off 05/11/15 Epi ---> off 05/12/15   NO:  Weaned off 05/12/15   PICC line: placed 05/12/15   Plan/Recommendations:  1.  Continue teaching with pt and wife. 2.  Possible discharge home next week.

## 2015-05-13 NOTE — Evaluation (Signed)
Occupational Therapy Evaluation Patient Details Name: Patrick Humphrey MRN: [id number] DOB: [birth year] Today's Date: 05/13/2015    History of Present Illness patient is a 50 yo male with nonischemic cardiomyopathy since 2002 with acute on chronic systolic heart failure and cardiogenic shock, deteriorating on 2 inotropes and had IABP placed. Moderate RV systolic dysfunction on preop echo.  s/p HeartMate II LVAD   Clinical Impression   Pt was independent in mobility and self care prior to admission.  Presents with R shoulder pain, decreased activity tolerance, generalized weakness and impaired balance. Pt fatigued following ambulation with PT.  Pt demonstrating ability to convert from battery support with set up.  Needs mod to max assist for bathing and dressing including vest. Began instruction in energy conservation. Anticipate pt will be able to discharge home with supportive wife. Will follow.    Follow Up Recommendations  Home health OT;Supervision/Assistance - 24 hour (depending on progress)   Equipment Recommendations  3 in 1 bedside comode    Recommendations for Other Services       Precautions / Restrictions Precautions Precautions: Sternal;Fall Precaution Comments: sternal,LVAD precautions- black bag at all times Restrictions Weight Bearing Restrictions: Yes (sternal precautions)      Mobility Bed Mobility      General bed mobility comments: pt in chair  Transfers Overall transfer level: Needs assistance Equipment used: None Transfers: Sit to/from Stand Sit to Stand: Min assist         General transfer comment: stood from chair holding heart pillow with min assist for stability    Balance Overall balance assessment: Needs assistance Sitting-balance support: Feet supported;No upper extremity supported Sitting balance-Leahy Scale: Good     Standing balance support: During functional activity Standing balance-Leahy Scale: Fair                              ADL Overall ADL's : Needs assistance/impaired Eating/Feeding: Sitting;Independent   Grooming: Wash/dry hands;Wash/dry face;Sitting;Set up   Upper Body Bathing: Moderate assistance;Sitting   Lower Body Bathing: Moderate assistance;Sit to/from stand   Upper Body Dressing : Maximal assistance;Sitting Upper Body Dressing Details (indicate cue type and reason): front opening gown and LVAD vest Lower Body Dressing: Moderate assistance;Sit to/from stand   Toilet Transfer: Minimal assistance;Ambulation;BSC   Toileting- Clothing Manipulation and Hygiene: Minimal assistance;Sit to/from stand       Functional mobility during ADLs: Minimal assistance;+2 for safety/equipment;Wheelchair General ADL Comments: Pt able to switch from battery power with set up. Max assist to manage vest.     Vision     Perception     Praxis      Pertinent Vitals/Pain Pain Assessment: Faces Faces Pain Scale: Hurts little more Pain Location: R shoulder Pain Descriptors / Indicators: Guarding;Grimacing Pain Intervention(s): Monitored during session;Patient requesting pain meds-RN notified     Hand Dominance Right   Extremity/Trunk Assessment Upper Extremity Assessment Upper Extremity Assessment: Generalized weakness   Lower Extremity Assessment Lower Extremity Assessment: Defer to PT evaluation       Communication Communication Communication: No difficulties   Cognition Arousal/Alertness: Awake/alert Behavior During Therapy: WFL for tasks assessed/performed Overall Cognitive Status: Within Functional Limits for tasks assessed                     General Comments       Exercises       Shoulder Instructions      Home Living Family/patient expects to be discharged to::  Private residence Living Arrangements: Spouse/significant other Available Help at Discharge: Family               Bathroom Shower/Tub: Teacher, early years/pre: Standard     Home  Equipment: None          Prior Functioning/Environment Level of Independence: Independent        Comments: retired Higher education careers adviser, now a Chief Technology Officer    OT Diagnosis: Generalized weakness;Acute pain   OT Problem List: Decreased strength;Decreased activity tolerance;Impaired balance (sitting and/or standing);Decreased knowledge of use of DME or AE;Cardiopulmonary status limiting activity;Pain   OT Treatment/Interventions: Self-care/ADL training;Energy conservation;DME and/or AE instruction;Patient/family education;Balance training;Therapeutic activities    OT Goals(Current goals can be found in the care plan section) Acute Rehab OT Goals Patient Stated Goal: to go home OT Goal Formulation: With patient Time For Goal Achievement: 05/27/15 Potential to Achieve Goals: Good ADL Goals Pt Will Perform Grooming: with supervision;standing Pt Will Perform Upper Body Bathing: with supervision;sitting Pt Will Perform Lower Body Bathing: with supervision;sit to/from stand Pt Will Perform Upper Body Dressing: with supervision;sitting Pt Will Perform Lower Body Dressing: with supervision;sit to/from stand Pt Will Transfer to Toilet: with supervision;ambulating;bedside commode (over toilet) Pt Will Perform Toileting - Clothing Manipulation and hygiene: with supervision;sit to/from stand Additional ADL Goal #1: Pt will manage LVAD equipment with supervision. Additional ADL Goal #2: Pt will employ energy conservation strategies with supervision during ADL and mobility.  OT Frequency: Min 2X/week   Barriers to D/C:            Co-evaluation              End of Session    Activity Tolerance: Patient limited by fatigue Patient left: in chair;with call bell/phone within reach;with nursing/sitter in room   Time: 1443-1510 OT Time Calculation (min): 27 min Charges:  OT General Charges $OT Visit: 1 Procedure OT Evaluation $OT Eval High Complexity: 1 Procedure OT Treatments $Self  Care/Home Management : 8-22 mins G-Codes:    Malka So 05/13/2015, 4:16 PM  [phone]

## 2015-05-13 NOTE — Progress Notes (Signed)
Physical Therapy Treatment Patient Details Name: Patrick Humphrey MRN: [id number] DOB: 02-Nov-1965 Today's Date: 05/13/2015    History of Present Illness patient is a 50 yo male with nonischemic cardiomyopathy since 2002 with acute on chronic systolic heart failure and cardiogenic shock, deteriorating on 2 inotropes and had IABP placed. Moderate RV systolic dysfunction on preop echo.  s/p HeartMate II LVAD    PT Comments    Patient more fatigued today and having right shoulder pain. Ambulated in hallway with battery support on RA today. Required seated rest break due to fatigue/pain. Continues to require cues to adhere to sternal precautions during mobility.  Continued to LVAD education. Pt very receptive and able to recall information from prior session. Patient able to perform conversion to battery support with min assist and verbal cues. Patient performed controller test and battery life test with minimal cues. Patient appears more comfortable with equipment and is receptive to all information/cues. Will continue to follow.   Follow Up Recommendations  Home health PT;Supervision/Assistance - 24 hour (pending progress, may not need HHPT)     Equipment Recommendations  Other (comment) (rollator)    Recommendations for Other Services       Precautions / Restrictions Precautions Precautions: Sternal Precaution Comments: LVAD precautions- black bag at all times Restrictions Weight Bearing Restrictions: Yes    Mobility  Bed Mobility Overal bed mobility: Needs Assistance Bed Mobility: Supine to Sit     Supine to sit: Mod assist;+2 for physical assistance     General bed mobility comments: +2 assist for line management, trunk and scooting bottom to EOB.  Transfers Overall transfer level: Needs assistance Equipment used: None Transfers: Sit to/from Stand Sit to Stand: Min assist         General transfer comment: Min assist for stability during elevation to upright. VCs  for technique with sternal precautions. Stood from [employer], from chair x1.   Ambulation/Gait Ambulation/Gait assistance: Min guard Ambulation Distance (Feet): 50 Feet (x2 bouts) Assistive device:  (pushing w/c) Gait Pattern/deviations: Step-through pattern;Trunk flexed Gait velocity: decreased Gait velocity interpretation: Below normal speed for age/gender General Gait Details: Slow, guarded gait with cues for upright posture. Complaints of right shoulder pain during ambulation. HR up to 120s bpm.  Fatigued requiring 1 seated rest break.   Stairs            Wheelchair Mobility    Modified Rankin (Stroke Patients Only)       Balance Overall balance assessment: Needs assistance Sitting-balance support: Feet supported;No upper extremity supported Sitting balance-Leahy Scale: Good     Standing balance support: During functional activity Standing balance-Leahy Scale: Fair                      Cognition Arousal/Alertness: Awake/alert Behavior During Therapy: WFL for tasks assessed/performed Overall Cognitive Status: Within Functional Limits for tasks assessed                      Exercises      General Comments General comments (skin integrity, edema, etc.): Continued to LVAD education.       Pertinent Vitals/Pain Pain Assessment: Faces Faces Pain Scale: Hurts little more Pain Location: right shoulder Pain Descriptors / Indicators: Grimacing;Guarding;Sore Pain Intervention(s): Monitored during session;Repositioned    Home Living                      Prior Function  PT Goals (current goals can now be found in the care plan section) Progress towards PT goals: Progressing toward goals (slowly)    Frequency  Min 4X/week    PT Plan Current plan remains appropriate    Co-evaluation             End of Session   Activity Tolerance: Patient limited by fatigue;Patient tolerated treatment well Patient left: in  chair;with call bell/phone within reach;Other (comment) (with OT in room.)     Time: [id number] PT Time Calculation (min) (ACUTE ONLY): 42 min  Charges:  $Gait Training: 23-37 mins $Self Care/Home Management: 8-22                    G Codes:      Sherronda Sweigert A Willies Laviolette 05/13/2015, 3:17 PM [name], Crystal, DPT [phone]

## 2015-05-13 NOTE — Progress Notes (Signed)
Nutrition Follow-up  DOCUMENTATION CODES:   Not applicable  INTERVENTION:   Ensure Enlive po daily, each supplement provides 350 kcal and 20 grams of protein  Boost Breeze po daily, each supplement provides 250 kcal and 9 grams of protein  NEW NUTRITION DIAGNOSIS:   Increased nutrient needs related to  (post-op healing) as evidenced by estimated needs, ongoing  GOAL:   Patient will meet greater than or equal to 90% of their needs, progressing  MONITOR:   PO intake, Supplement acceptance, Labs, Weight trends, I & O's  ASSESSMENT:   50 year old male with long-standing history of chronic systolic heart failure, nonischemic cardiomyopathy, ICD, Dr. Gwenlyn Found, who has been experiencing increasing dyspnea consistent with acute on chronic systolic heart failure.  Patient s/p procedure 1/31: INSERTION OF HEARTMATE II IMPLANTABLE LEFT VENTRICULAR ASSIST DEVICE   Patient extubated 2/1.    Dr. Prescott Gum assessing patient upon RD visit.  Did not disturb.  PO intake has been good at 75-100% per flowsheet records.  Prior to surgery, pt's intake was variable due to feeling sleepy and restrictions to his diet.  He was also receiving oral nutrition supplements.  This RD to re-order.  Diet Order:  Diet Heart Room service appropriate?: Yes; Fluid consistency:: Thin  Skin:  Reviewed, no issues  Last BM:  1/27  Height:   Ht Readings from Last 1 Encounters:  05/08/15 5\' 11"  (1.803 m)    Weight:   Wt Readings from Last 1 Encounters:  05/13/15 178 lb 2.1 oz (80.8 kg)    Ideal Body Weight:  78.2 kg  BMI:  Body mass index is 24.86 kg/(m^2).  Estimated Nutritional Needs:   Kcal:  1800-2000  Protein:  85-100 grams  Fluid:  1.8-2.0 L  EDUCATION NEEDS:   No education needs identified at this time  Arthur Holms, RD, LDN Pager #: [phone] After-Hours Pager #: [phone]

## 2015-05-13 NOTE — Progress Notes (Signed)
Patient ID: Patrick Humphrey, male   DOB: 01/04/1966, 50 y.o.   MRN: [id number] HeartMate 2 Rounding Note  Subjective:    LVAD placement 05/10/15.   Extubated 2/1.  Got 2 units PRBCs 1/31. Hemoglobin trending down 8.6>8.2.  Chest tubes remain in place.  MAP 80s today.    Yesterday milrinone weaned to 0.25 mcg. Todays CO-OX is  68%.    HR up to 130s today, possible atrial flutter.   LVAD INTERROGATION:  HeartMate II LVAD:  Flow 4.7 liters/min, speed 8990,  power 5, PI 7.4.  Rare PI events.   Objective:    Vital Signs:   Temp:  [97.3 F (36.3 C)-98.2 F (36.8 C)] 98 F (36.7 C) (02/03 0758) Pulse Rate:  [88-125] 125 (02/03 0700) Resp:  [0-33] 21 (02/03 0700) SpO2:  [97 %-100 %] 99 % (02/03 0700) Arterial Line BP: (93-110)/(74-90) 94/74 mmHg (02/02 1400) Weight:  [178 lb 2.1 oz (80.8 kg)] 178 lb 2.1 oz (80.8 kg) (02/03 0500) Last BM Date: 05/06/15 Mean arterial Pressure 80s  Intake/Output:   Intake/Output Summary (Last 24 hours) at 05/13/15 0812 Last data filed at 05/13/15 0700  Gross per 24 hour  Intake 3321.27 ml  Output   2135 ml  Net 1186.27 ml     Physical Exam: CVP 12 General:  NAD. No resp difficulty. Sitting in the chair.  HEENT: normal Neck: supple. JVP 8-9. Carotids 2+ bilat; no bruits. No lymphadenopathy or thryomegaly appreciated. L subclavian TLC Cor: Mechanical heart sounds with LVAD hum present. Lungs: clear Abdomen: soft, nontender, nondistended. No hepatosplenomegaly. No bruits or masses. Good bowel sounds. Driveline: C/D/I; securement device intact and driveline incorporated Extremities: no cyanosis, clubbing, rash, edema Neuro: alert & orientedx3, cranial nerves grossly intact. moves all 4 extremities w/o difficulty. Affect pleasant  Telemetry: ?atrial flutter 120  Labs: Basic Metabolic Panel:  Recent Labs Lab 05/10/15 0200  05/10/15 1520 05/10/15 2000  05/11/15 0320 05/11/15 1609 05/11/15 1610 05/12/15 0340 05/12/15 1612  05/13/15 0430  NA 127*  < > 137  --   < > 136 135  --  133* 132* 133*  K 3.6  < > 3.5  --   < > 4.5 4.5  --  4.4 4.5 4.0  CL 84*  < > 100*  --   < > 102 97*  --  100* 95* 103  CO2 31  --  27  --   --  24  --   --  24  --  22  GLUCOSE 216*  < > 120*  --   < > 85 124*  --  99 116* 79  BUN 20  < > 13  --   < > 13 12  --  11 14 13   CREATININE 1.04  < > 0.91 0.92  < > 0.77 0.70 0.80 0.82 0.80 0.82  CALCIUM 8.4*  --  7.7*  --   --  7.9*  --   --  8.5*  --  7.9*  MG  --   < > 1.8 2.9*  --  2.3  --  2.0 1.9  --  1.6*  PHOS  --   --   --   --   --  2.6  --   --  3.5  --  2.8  < > = values in this interval not displayed.  Liver Function Tests:  Recent Labs Lab 05/09/15 0500 05/11/15 0320 05/12/15 0340 05/13/15 0430  AST 68* 84* 92* 64*  ALT  38 24 24 25   ALKPHOS 81 37* 38 48  BILITOT 3.5* 9.2* 12.9* 9.8*  PROT 6.5 4.8* 4.9* 4.9*  ALBUMIN 3.0* 3.1* 2.9* 2.5*   No results for input(s): LIPASE, AMYLASE in the last 168 hours. No results for input(s): AMMONIA in the last 168 hours.  CBC:  Recent Labs Lab 05/10/15 2000  05/11/15 0320 05/11/15 1609 05/11/15 1610 05/12/15 0340 05/12/15 1612 05/13/15 0430  WBC 5.4  --  5.6  --  8.0 8.9  --  8.3  NEUTROABS  --   --  3.7  --   --  6.5  --  6.2  HGB 8.0*  < > 9.6* 8.8* 8.4* 8.6* 9.9* 8.2*  HCT 22.8*  < > 28.2* 26.0* 24.1* 24.4* 29.0* 23.9*  MCV 94.6  --  93.1  --  93.8 92.4  --  94.5  PLT 99*  --  92*  --  91* 89*  --  100*  < > = values in this interval not displayed.  INR:  Recent Labs Lab 05/10/15 1445 05/10/15 1520 05/11/15 0320 05/12/15 0340 05/13/15 0430  INR 1.33 1.35 1.57* 1.53* 1.33    Other results:  EKG:   Imaging: Dg Chest Port 1 View  05/12/2015  CLINICAL DATA:  Central line placement. EXAM: PORTABLE CHEST 1 VIEW COMPARISON:  Single view of the chest earlier today. FINDINGS: Right IJ approach Swan-Ganz catheter has been removed. Right IJ sheath is in place and projects at the junction of the brachiocephalic  veins and superior vena cava. Left chest tube remains in place. Left ventricular assist device is partially visualized. There is a tiny left apical pneumothorax. No right pneumothorax is identified. Left basilar atelectasis is noted. IMPRESSION: Right IJ sheath projects at the junction of the brachiocephalic veins and superior vena cava. Negative for right pneumothorax. Tiny left apical pneumothorax with a left chest tube in place. Electronically Signed   By: Inge Rise M.D.   On: 05/12/2015 16:52   Dg Chest Port 1 View  05/12/2015  CLINICAL DATA:  Left ventricular assist device EXAM: PORTABLE CHEST 1 VIEW COMPARISON:  05/11/2015. FINDINGS: Left ventricular assist device in satisfactory position, unchanged. Swan-Ganz catheter in the right main pulmonary artery. Bilateral chest tubes. No pneumothorax. AICD in good position and unchanged. Partial clearing of interstitial edema. Bibasilar atelectasis and effusions also show mild interval improvement. IMPRESSION: Improving interstitial edema. Improved aeration of the lung bases. Electronically Signed   By: Franchot Gallo M.D.   On: 05/12/2015 07:44     Medications:     Scheduled Medications: . acetaminophen  1,000 mg Oral 4 times per day   Or  . acetaminophen (TYLENOL) oral liquid 160 mg/5 mL  1,000 mg Per Tube 4 times per day  . antiseptic oral rinse  7 mL Mouth Rinse BID  . aspirin EC  325 mg Oral Daily   Or  . aspirin  324 mg Per Tube Daily   Or  . aspirin  300 mg Rectal Daily  . bisacodyl  10 mg Oral Daily   Or  . bisacodyl  10 mg Rectal Daily  . docusate sodium  200 mg Oral Daily  . feeding supplement (ENSURE ENLIVE)  237 mL Oral BID BM  . ferrous Q000111Q C-folic acid  1 capsule Oral TID PC  . furosemide  40 mg Intravenous BID  . insulin aspart  0-24 Units Subcutaneous 6 times per day  . metoCLOPramide (REGLAN) injection  10 mg Intravenous 4 times per day  .  pantoprazole  40 mg Oral Daily  . potassium chloride  10 mEq  Intravenous Q1 Hr x 2  . sodium chloride flush  3 mL Intravenous Q12H  . vancomycin  1,500 mg Intravenous Q12H  . warfarin  4 mg Oral q1800  . Warfarin - Physician Dosing Inpatient   Does not apply q1800    Infusions: . sodium chloride 20 mL/hr at 05/12/15 0700  . sodium chloride 250 mL (05/11/15 0606)  . sodium chloride    . epinephrine Stopped (05/12/15 1000)  . lactated ringers 20 mL/hr at 05/13/15 0400  . lactated ringers Stopped (05/12/15 2000)  . milrinone 0.25 mcg/kg/min (05/13/15 0700)    PRN Medications: sodium chloride, ALPRAZolam, morphine injection, ondansetron (ZOFRAN) IV, oxyCODONE, sodium chloride flush, traMADol   Assessment:   1. Nonischemic cardiomyopathy: Long-standing, EF 20-25%.  Coronary angiography this admission with no significant CAD. Cardiogenic shock requiring milrinone and norepinephrine, IABP placed. Heartmate II LVAD placed 05/10/15.  2. Post-op blood loss anemia: 2 units PRBCs 1/31.  3. Elevated LFTs: Suspect due to CHF.  4. ?Atrial flutter   Plan/Discussion:    Todays CO-OX is 68%.  He is tachycardic to 130s, possible atrial flutter. Cut back milrinone to 0.125 mcg. Give 0.25 digoxin today then start dig 0.125 daily. Volume status elevated. Continue 40 mg IV lasix twice a day. MAP ok.   INR today 1.33. On 325 mg aspirin.  Can cut back to 81 mg daily once INR up a little higher.  Hgb trending down 8.6>8.2.  No transfusion for now.   Chest tubes remain in place.  Pain is controlled  I reviewed the LVAD parameters from today, and compared the results to the patient's prior recorded data.  No programming changes were made.  The LVAD is functioning within specified parameters.  The patient performs LVAD self-test daily.  LVAD interrogation was negative for any significant power changes, alarms or PI events/speed drops.  LVAD equipment check completed and is in good working order.  Back-up equipment present.   LVAD education done on emergency  procedures and precautions and reviewed exit site care.  Length of Stay: [place] NP-C  05/13/2015, 8:12 AM  VAD Team --- VAD ISSUES ONLY--- Pager [phone] (7am - 7am)  Advanced Heart Failure Team  Pager [phone] (M-F; 7a - 4p)  Please contact [facility] for night-coverage after hours (4p -7a ) and weekends on [email or web address]  Patient seen with NP, agree with the above note.  Doing well overall, good co-ox.  Still with some volume overload and elevated CVP.  HR up to 130s, possible atrial flutter.  Attempted pace termination this morning, not successful.  - Added amiodarone gtt.  If flutter does not convert on amiodarone, may need cardioversion at some point.  - Decrease milrinone to 0.125 and may be able to stop tomorrow. - Lasix 40 mg IV bid.  - Add digoxin.   [name] 05/13/2015 1:29 PM

## 2015-05-13 NOTE — Progress Notes (Signed)
HeartMate 2 Rounding Note     Postop day #3 HeartMate 2 LVAD implantation   Subjective:   History of nonischemic cardiomyopathy, ejection fraction 15% Preoperative cardiogenic shock on balloon pump and dual inotropes Preoperative protein deficiency malnutrition with prealbumin 8.4 Preoperative moderate RV dysfunction Preoperative hepatic insufficiency with bilirubin of 4.2  Status post AICD implantation 2002  The patient has progressed first 3 days  hours after surgery. He was extubated postop day 1. He ambulated in the hallway today . His VAD parameters have remained satisfactory. He has weaned off nitric oxide as well as epinephrine with stable RV performance. He is maintaining a sinus rhythm.His milrinone is weaning and CO-Ox remains good 63%  Chest tube drainage remains significant but serous and the left pleural, mediastinal, and pocket drains remain. He is diuresing fairly well and has lost fluid weight daily. Pain is well-controlled and neuro status is stable INR today is 1.4, Coumadin ordered His postop bilirubin hasimproved4.2 >> 8>>`12  >> 9  probably due to shock liver preop  He developed A-flutter today- started amiodarone and attempted RAP   LVAD INTERROGATION:  HeartMate II LVAD:  Flow 4.8 liters/min, speed 9000, power 5.2 PI 6.2  Controller Serial intact   Objective:    Vital Signs:   Temp:  [97.8 F (36.6 C)-98.1 F (36.7 C)] 98 F (36.7 C) (02/03 1551) Pulse Rate:  [108-132] 109 (02/03 1800) Resp:  [0-28] 22 (02/03 1800) SpO2:  [94 %-100 %] 97 % (02/03 1800) Weight:  [178 lb 2.1 oz (80.8 kg)] 178 lb 2.1 oz (80.8 kg) (02/03 0500) Last BM Date: 05/06/15 Mean arterial Pressure  75-80  Intake/Output:   Intake/Output Summary (Last 24 hours) at 05/13/15 1847 Last data filed at 05/13/15 1800  Gross per 24 hour  Intake 2858.23 ml  Output   1855 ml  Net 1003.23 ml     Physical Exam: General:  Well appearing. No resp difficulty HEENT: normal Neck:  supple. JVP . Carotids 2+ bilat; no bruits. No lymphadenopathy or thryomegaly appreciated. Cor: Mechanical heart sounds with LVAD hum present. Lungs: clear Abdomen: soft, nontender, nondistended. No hepatosplenomegaly. No bruits or masses. Good bowel sounds. Extremities: no cyanosis, clubbing, rash, edema Neuro: alert & orientedx3, cranial nerves grossly intact. moves all 4 extremities w/o difficulty. Affect pleasant  Telemetry: sinus tach  Labs: Basic Metabolic Panel:  Recent Labs Lab 05/10/15 0200  05/10/15 1520 05/10/15 2000  05/11/15 0320 05/11/15 1609 05/11/15 1610 05/12/15 0340 05/12/15 1612 05/13/15 0430 05/13/15 1635  NA 127*  < > 137  --   < > 136 135  --  133* 132* 133* 127*  K 3.6  < > 3.5  --   < > 4.5 4.5  --  4.4 4.5 4.0 4.3  CL 84*  < > 100*  --   < > 102 97*  --  100* 95* 103 97*  CO2 31  --  27  --   --  24  --   --  24  --  22  --   GLUCOSE 216*  < > 120*  --   < > 85 124*  --  99 116* 79 122*  BUN 20  < > 13  --   < > 13 12  --  11 14 13 17   CREATININE 1.04  < > 0.91 0.92  < > 0.77 0.70 0.80 0.82 0.80 0.82 0.80  CALCIUM 8.4*  --  7.7*  --   --  7.9*  --   --  8.5*  --  7.9*  --   MG  --   < > 1.8 2.9*  --  2.3  --  2.0 1.9  --  1.6*  --   PHOS  --   --   --   --   --  2.6  --   --  3.5  --  2.8  --   < > = values in this interval not displayed.  Liver Function Tests:  Recent Labs Lab 05/09/15 0500 05/11/15 0320 05/12/15 0340 05/13/15 0430  AST 68* 84* 92* 64*  ALT 38 24 24 25   ALKPHOS 81 37* 38 48  BILITOT 3.5* 9.2* 12.9* 9.8*  PROT 6.5 4.8* 4.9* 4.9*  ALBUMIN 3.0* 3.1* 2.9* 2.5*   No results for input(s): LIPASE, AMYLASE in the last 168 hours. No results for input(s): AMMONIA in the last 168 hours.  CBC:  Recent Labs Lab 05/10/15 2000  05/11/15 0320  05/11/15 1610 05/12/15 0340 05/12/15 1612 05/13/15 0430 05/13/15 1635  WBC 5.4  --  5.6  --  8.0 8.9  --  8.3  --   NEUTROABS  --   --  3.7  --   --  6.5  --  6.2  --   HGB 8.0*  < >  9.6*  < > 8.4* 8.6* 9.9* 8.2* 9.5*  HCT 22.8*  < > 28.2*  < > 24.1* 24.4* 29.0* 23.9* 28.0*  MCV 94.6  --  93.1  --  93.8 92.4  --  94.5  --   PLT 99*  --  92*  --  91* 89*  --  100*  --   < > = values in this interval not displayed.  INR:  Recent Labs Lab 05/10/15 1445 05/10/15 1520 05/11/15 0320 05/12/15 0340 05/13/15 0430  INR 1.33 1.35 1.57* 1.53* 1.33    Other results:  EKG:   Imaging: Dg Chest Port 1 View  05/12/2015  CLINICAL DATA:  Central line placement. EXAM: PORTABLE CHEST 1 VIEW COMPARISON:  Single view of the chest earlier today. FINDINGS: Right IJ approach Swan-Ganz catheter has been removed. Right IJ sheath is in place and projects at the junction of the brachiocephalic veins and superior vena cava. Left chest tube remains in place. Left ventricular assist device is partially visualized. There is a tiny left apical pneumothorax. No right pneumothorax is identified. Left basilar atelectasis is noted. IMPRESSION: Right IJ sheath projects at the junction of the brachiocephalic veins and superior vena cava. Negative for right pneumothorax. Tiny left apical pneumothorax with a left chest tube in place. Electronically Signed   By: Inge Rise M.D.   On: 05/12/2015 16:52   Dg Chest Port 1 View  05/12/2015  CLINICAL DATA:  Left ventricular assist device EXAM: PORTABLE CHEST 1 VIEW COMPARISON:  05/11/2015. FINDINGS: Left ventricular assist device in satisfactory position, unchanged. Swan-Ganz catheter in the right main pulmonary artery. Bilateral chest tubes. No pneumothorax. AICD in good position and unchanged. Partial clearing of interstitial edema. Bibasilar atelectasis and effusions also show mild interval improvement. IMPRESSION: Improving interstitial edema. Improved aeration of the lung bases. Electronically Signed   By: Franchot Gallo M.D.   On: 05/12/2015 07:44     Medications:     Scheduled Medications: . acetaminophen  1,000 mg Oral 4 times per day   Or  .  acetaminophen (TYLENOL) oral liquid 160 mg/5 mL  1,000 mg Per Tube 4 times per day  . amiodarone      .  antiseptic oral rinse  7 mL Mouth Rinse BID  . aspirin EC  325 mg Oral Daily   Or  . aspirin  324 mg Per Tube Daily   Or  . aspirin  300 mg Rectal Daily  . bisacodyl  10 mg Oral Daily   Or  . bisacodyl  10 mg Rectal Daily  . chlorpheniramine-HYDROcodone  5 mL Oral QHS  . [START ON 05/14/2015] digoxin  0.125 mg Oral Daily  . docusate sodium  200 mg Oral Daily  . feeding supplement  1 Container Oral Q1400  . [START ON 05/14/2015] feeding supplement (ENSURE ENLIVE)  237 mL Oral Daily  . ferrous Q000111Q C-folic acid  1 capsule Oral TID PC  . furosemide  40 mg Intravenous BID  . guaiFENesin  600 mg Oral BID  . insulin aspart  0-15 Units Subcutaneous TID WC  . insulin aspart  0-5 Units Subcutaneous QHS  . metoCLOPramide (REGLAN) injection  10 mg Intravenous 4 times per day  . pantoprazole  40 mg Oral Daily  . sodium chloride flush  3 mL Intravenous Q12H  . warfarin  4 mg Oral q1800  . Warfarin - Physician Dosing Inpatient   Does not apply q1800    Infusions: . sodium chloride 20 mL/hr at 05/12/15 0700  . sodium chloride 250 mL (05/11/15 0606)  . sodium chloride    . amiodarone 30 mg/hr (05/13/15 1700)  . lactated ringers 20 mL/hr at 05/13/15 0400  . lactated ringers Stopped (05/12/15 2000)  . milrinone 0.125 mcg/kg/min (05/13/15 1030)    PRN Medications: sodium chloride, ALPRAZolam, morphine injection, ondansetron (ZOFRAN) IV, oxyCODONE, sodium chloride flush, traMADol   Assessment:  Left subclavian triple-lumen catheter placed 2-2 fter attempt at PICC line was unsuccessful Small intermittent air leak from left pleural chest tube persists Right pleural chest tube was removed Advancing diet Coumadin 2.5 mg ordered-- INR down to 1.33   Plan/Discussion:   Continue diuresis Advance diet, maximize protein intake   Follow bilirubin Continue with physical  therapy Treat A-flutter with iv amio   I reviewed the LVAD parameters from today, and compared the results to the patient's prior recorded data.  No programming changes were made.  The LVAD is functioning within specified parameters.  The patient performs LVAD self-test daily.  LVAD interrogation was negative for any significant power changes, alarms or PI events/speed drops.  LVAD equipment check completed and is in good working order.  Back-up equipment present.   LVAD education done on emergency procedures and precautions and reviewed exit site care.  Length of Stay: [place] [facility] 05/13/2015, 6:47 PM

## 2015-05-13 NOTE — Op Note (Signed)
NAMEJARY, HAMMANS NO.:  [id number]  MEDICAL RECORD NO.:  [id number]  LOCATION:  2S07C                        FACILITY:  [facility]  PHYSICIAN:  Ivin Poot, M.D.  DATE OF BIRTH:  13-Oct-1965  DATE OF PROCEDURE:  05/10/2015 DATE OF DISCHARGE:                              OPERATIVE REPORT   OPERATION:  Implantation of HeartMate II left ventricular assist device.  PREOPERATIVE DIAGNOSES: 1. Nonischemic cardiomyopathy with severe acute-on-chronic left     ventricular systolic heart failure. 2. Moderate right ventricular dysfunction.  POSTOPERATIVE DIAGNOSES: 1. Nonischemic cardiomyopathy with severe acute-on-chronic left     ventricular systolic heart failure. 2. Moderate right ventricular dysfunction.  ANESTHESIA:  General by Nelda Severe Tobias Alexander, MD  SURGEONS:  Ivin Poot, MD; and Gilford Raid, MD.  ASSISTANT:  Amelia Jo. Earleen Newport, RNFA  INDICATIONS:  The patient is a 50 year old, Caucasian male with long history of nonischemic cardiomyopathy, first diagnosed in 2002.  At that time, his ejection fraction was 25% and an AICD was implanted at an outside hospital.  He was followed by the Cardiology Service over the years and had compensated heart failure on heart failure medications and did fairly well.  Recently, he was admitted with acute decompensation after his medication doses had been reduced for low blood pressure and failing general medical condition.  When he was admitted to the hospital, he was found to be in cardiogenic shock and was placed on inotropes.  He improved transiently and underwent right heart catheterization and left heart catheterization which showed no significant coronary disease and abnormal right heart pressures.  He was placed on inotropes after he was studied transiently off the milrinone; however his hemodynamics did not stabilize and he required a balloon pump placement and an additional inotrope for dual inotropic support  for his cardiogenic shock.  He was efficiently evaluated by the VAD team and felt to be a candidate for destination therapy VAD implantation.  I personally reviewed the patient's echocardiograms, his cath, and his CT scans and reviewed the results of his VAD evaluation with the patient and his wife.  I discussed the procedure of VAD implantation in detail with the patient, his wife including the use of general anesthesia and cardiopulmonary bypass, location of the surgical incisions, and the expected postoperative hospital recovery.  I discussed with the patient, the potential risks of the surgery also to include bleeding, blood transfusion requirement, postoperative pulmonary problems including pleural effusion, postoperative infection, postoperative GI bleeding, multisystem failure, and death.  He demonstrated his understanding and agreed to proceed with surgery under what I felt was an informed consent.  OPERATIVE FINDINGS: 1. RV function was moderately impaired, but improved after unloading     the right heart with the VAD. 2. Packed cell transfusions were not required for the surgery. 3. The patient was separated from cardiopulmonary bypass easily with     full VAD support and minimal ionotropic support.  OPERATIVE PROCEDURE:  The patient was brought directly from the CCU, on balloon pump to the operating room, placed supine on the operating table.  General anesthesia was induced.  His previously placed PA catheter had been removed.  A transesophageal echo probe was placed by  the anesthesiologist.  The patient was prepped and draped as a sterile field.  A proper time-out was performed.  A sternal incision was made.  The sternum was elevated and the preperitoneal pocket was then created by dissecting the left hemidiaphragm and then the posterior rectus sheath off the perineum. After the adequate pocket was created, the regular sternal retractor was placed and the pericardium was  opened and suspended.  The RV was dilated and moderately hypokinetic.  Pursestrings were placed in the ascending aorta and right atrium.  Then the patient was heparinized. When ACT was documented as being therapeutic, the patient was cannulated.  He was not placed on cardiopulmonary bypass.  The outflow tract graft was measured to be appropriate length and taper and then was sewn end-to-side to the ascending aorta using a partial occluding clamp which the patient's hemodynamics tolerated.  A running 4-0 Prolene was used and this was covered with a fine layer of medical adhesive-Coseal.  The partial clamp was removed and the graft was flushed and was then clamped with a vascular clamp and placed aside.  The anastomosis was hemostatic.  The patient was placed on cardiopulmonary bypass.  The LV was elevated in the apex, stabilized on several moist laparotomy pads.  An incision was made just above the apex and a Foley catheter was inserted into the LV chamber.  Over the Foley catheter, a cutting knife was then used to core out the LV apex with appropriate orientation and angle and the apical plug with the Foley catheter was then removed.  A suction catheter was placed in the LV.  Trabecula were trimmed.  There was no thrombus noted.  A 2-0 Ethibond MH pledgeted sutures were placed around the circumference of the LV opening.  Next, the Dacron cuff of the silastic sleeve was then sewn to the LV apex by tying sequentially the Ethibond sutures.  This was reinforced with a fine layer of CoSeal.  There was no spaces between the sutures to allow for bleeding.  Next, the HeartMate II pump was brought to the field.  The driveline tunneler has been previously placed through the 2 abdominal incisions. The patient was placed in steep Trendelenburg and volume was left to the patient, CO2 was insufflated into the field.  The inflow cannula was inserted in the silastic sleeve and secured with the  incorporated heavy tie.  Above the tie, a silastic band was placed and tightened.  Above and below the silastic band 2 heavy Ethibond sutures were tied securely. This secured the inflow cannula.  The driveline was brought out through the 2 abdominal incisions and out the right upper quadrant exit site.  The power cord was connected to the controller.  With the patient still with full volume and head in Trendelenburg, the outflow connection to the outflow graft was connected by a hand tightening.  A graft clamp was placed just above the outflow graft origin and a 20-gauge needle was positioned.  The pump was then placed back into the pocket at the appropriate orientation and position.  At this point, the lungs were re-expanded and the ventilator was resumed using nitric oxide.  Low-dose inotropes were started.  We transitioned from cardiopulmonary bypass to the VAD supported circulation slowly by starting at 6000 rpm with a clamp on the outflow graft and then increasing rpms after the clamp was removed from 7000-8000 as pump flow was reduced by 1/2 to 1/4th flow.  The patient was completely separated from cardiopulmonary bypass at  a VAD speed of 8400 rpms.  Mean arterial pressure, was stable.  Echo showed the septum to be slightly bowed to the right.  Good flow parameters on the VAD monitor were noted.  The patient had a protamine test dose which he tolerated well.  We removed the venous cannula and administered more volume to the patient and then increased the VATS speed to 8600.  Protamine was completed.  The venous cannula was removed.  Bleeding was improved, but patient required 2 units of FFP to provide adequate hemostasis.  The echo showed good position of the apical inflow cannula.  The outflow bend relief was then secured to the VAD using the bend relief collar.  The outflow elbow was secured to the posterior rectus sheath using a heavy #1 Vicryl suture to keep the  VAD secured down in the pocket.  The patient remained stable with excellent VAD parameters.  Bilateral pleural tubes, anterior mediastinal tube, and a VAD pocket drain were all then placed and brought out through separate incisions and secured to the skin.  Temporary pacer wires had been placed.  The pericardium was closed over the aorta and the right ventricle.  The patient remained stable.  The sternum was closed with interrupted steel wire.  The patient remained stable.  The pectoralis fascia was closed with interrupted #1 Vicryl.  The subcutaneous and skin layers were closed in running Vicryl.  The abdominal counter incision was closed with interrupted subcutaneous Vicryl and a running subcuticular Vicryl. The power cord exit site was closed with a subcutaneous 3-0 Vicryl, and 2 3-0 nylon skin sutures.  Sterile dressings were placed.  The patient then was transported back to the ICU in stable condition.  Total cardiopulmonary bypass time was 61 minutes.     Ivin Poot, M.D.     PV/MEDQ  D:  05/13/2015  T:  05/13/2015  Job:  [id number]  cc:   Shaune Pascal. Bensimhon, MD [name], MD

## 2015-05-13 NOTE — Progress Notes (Signed)
05/13/15 A.M. ABG not done/Arterial line removed prior, is LVAD pt. wiith poor access, RN aware, COOX drawn/sent, awaiting results.

## 2015-05-14 ENCOUNTER — Inpatient Hospital Stay (HOSPITAL_COMMUNITY): Payer: Medicare Other

## 2015-05-14 DIAGNOSIS — Z95811 Presence of heart assist device: Secondary | ICD-10-CM

## 2015-05-14 DIAGNOSIS — I5023 Acute on chronic systolic (congestive) heart failure: Secondary | ICD-10-CM

## 2015-05-14 LAB — CBC WITH DIFFERENTIAL/PLATELET
Basophils Absolute: 0.1 10*3/uL (ref 0.0–0.1)
Basophils Relative: 1 %
Eosinophils Absolute: 0.2 10*3/uL (ref 0.0–0.7)
Eosinophils Relative: 2 %
HCT: 25.7 % — ABNORMAL LOW (ref 39.0–52.0)
Hemoglobin: 8.9 g/dL — ABNORMAL LOW (ref 13.0–17.0)
Lymphocytes Relative: 13 %
Lymphs Abs: 1.2 10*3/uL (ref 0.7–4.0)
MCH: 32.7 pg (ref 26.0–34.0)
MCHC: 34.6 g/dL (ref 30.0–36.0)
MCV: 94.5 fL (ref 78.0–100.0)
Monocytes Absolute: 1.2 10*3/uL — ABNORMAL HIGH (ref 0.1–1.0)
Monocytes Relative: 13 %
Neutro Abs: 6.4 10*3/uL (ref 1.7–7.7)
Neutrophils Relative %: 71 %
Platelets: 136 10*3/uL — ABNORMAL LOW (ref 150–400)
RBC: 2.72 MIL/uL — ABNORMAL LOW (ref 4.22–5.81)
RDW: 16.2 % — ABNORMAL HIGH (ref 11.5–15.5)
WBC: 9 10*3/uL (ref 4.0–10.5)

## 2015-05-14 LAB — MAGNESIUM: Magnesium: 1.7 mg/dL (ref 1.7–2.4)

## 2015-05-14 LAB — COMPREHENSIVE METABOLIC PANEL
ALT: 29 U/L (ref 17–63)
AST: 68 U/L — ABNORMAL HIGH (ref 15–41)
Albumin: 2.6 g/dL — ABNORMAL LOW (ref 3.5–5.0)
Alkaline Phosphatase: 74 U/L (ref 38–126)
Anion gap: 11 (ref 5–15)
BUN: 15 mg/dL (ref 6–20)
CO2: 24 mmol/L (ref 22–32)
Calcium: 8.9 mg/dL (ref 8.9–10.3)
Chloride: 93 mmol/L — ABNORMAL LOW (ref 101–111)
Creatinine, Ser: 0.94 mg/dL (ref 0.61–1.24)
GFR calc Af Amer: >60 mL/min (ref >60)
GFR calc non Af Amer: >60 mL/min (ref >60)
Glucose, Bld: 103 mg/dL — ABNORMAL HIGH (ref 65–99)
Potassium: 4.2 mmol/L (ref 3.5–5.1)
Sodium: 128 mmol/L — ABNORMAL LOW (ref 135–145)
Total Bilirubin: 8.2 mg/dL — ABNORMAL HIGH (ref 0.3–1.2)
Total Protein: 5.4 g/dL — ABNORMAL LOW (ref 6.5–8.1)

## 2015-05-14 LAB — GLUCOSE, CAPILLARY
Glucose-Capillary: 89 mg/dL (ref 65–99)
Glucose-Capillary: 103 mg/dL — ABNORMAL HIGH (ref 65–99)
Glucose-Capillary: 77 mg/dL (ref 65–99)
Glucose-Capillary: 102 mg/dL — ABNORMAL HIGH (ref 65–99)

## 2015-05-14 LAB — CARBOXYHEMOGLOBIN
Carboxyhemoglobin: 1.5 % (ref 0.5–1.5)
Carboxyhemoglobin: 1.8 % — ABNORMAL HIGH (ref 0.5–1.5)
Methemoglobin: 0.9 % (ref 0.0–1.5)
Methemoglobin: 0.7 % (ref 0.0–1.5)
O2 Saturation: 64 %
O2 Saturation: 56.6 %
Total hemoglobin: 9.3 g/dL — ABNORMAL LOW (ref 13.5–18.0)
Total hemoglobin: 8.4 g/dL — ABNORMAL LOW (ref 13.5–18.0)

## 2015-05-14 LAB — BILIRUBIN, DIRECT: Bilirubin, Direct: 5 mg/dL — ABNORMAL HIGH (ref 0.1–0.5)

## 2015-05-14 LAB — PROTIME-INR
INR: 1.37 (ref 0.00–1.49)
Prothrombin Time: 17 seconds — ABNORMAL HIGH (ref 11.6–15.2)

## 2015-05-14 LAB — LACTATE DEHYDROGENASE: LDH: 279 U/L — ABNORMAL HIGH (ref 98–192)

## 2015-05-14 LAB — TYPE AND SCREEN
ABO/RH(D): A POS
Antibody Screen: NEGATIVE

## 2015-05-14 MED ORDER — FUROSEMIDE 40 MG PO TABS
40.0000 mg | ORAL_TABLET | Freq: Two times a day (BID) | ORAL | Status: DC
Start: 2015-05-14 — End: 2015-05-15
  Administered 2015-05-14: 40 mg via ORAL
  Filled 2015-05-14: qty 1

## 2015-05-14 MED ORDER — METOCLOPRAMIDE HCL 10 MG PO TABS
10.0000 mg | ORAL_TABLET | Freq: Three times a day (TID) | ORAL | Status: DC
  Administered 2015-05-14 – 2015-05-16 (×6): 10 mg via ORAL
  Filled 2015-05-14 (×6): qty 1

## 2015-05-14 MED ORDER — WARFARIN SODIUM 3 MG PO TABS
6.0000 mg | ORAL_TABLET | Freq: Every day | ORAL | Status: DC
  Administered 2015-05-14: 6 mg via ORAL
  Filled 2015-05-14: qty 2

## 2015-05-14 MED ORDER — ALBUMIN HUMAN 25 % IV SOLN
12.5000 g | Freq: Four times a day (QID) | INTRAVENOUS | Status: AC
  Administered 2015-05-14 (×3): 12.5 g via INTRAVENOUS
  Filled 2015-05-14 (×3): qty 50

## 2015-05-14 MED ORDER — SODIUM CHLORIDE 0.9% FLUSH
10.0000 mL | INTRAVENOUS | Status: DC | PRN
  Administered 2015-05-17 (×2): 10 mL via INTRAVENOUS
  Filled 2015-05-14 (×2): qty 10

## 2015-05-14 MED ORDER — CYCLOBENZAPRINE HCL 10 MG PO TABS
10.0000 mg | ORAL_TABLET | Freq: Every day | ORAL | Status: DC
Start: 2015-05-14 — End: 2015-05-15
  Administered 2015-05-14: 10 mg via ORAL
  Filled 2015-05-14 (×2): qty 1

## 2015-05-14 MED ORDER — SODIUM CHLORIDE 0.9% FLUSH
10.0000 mL | Freq: Two times a day (BID) | INTRAVENOUS | Status: DC
  Administered 2015-05-15 – 2015-05-30 (×18): 10 mL via INTRAVENOUS

## 2015-05-14 NOTE — Progress Notes (Signed)
Patient ID: Patrick Humphrey, male   DOB: October 02, 1965, 50 y.o.   MRN: [id number] HeartMate 2 Rounding Note  Subjective:    LVAD placement 05/10/15.   Extubated 2/1.  Got 2 units PRBCs 1/31. Hemoglobin 8.6>8.2>8.9.  Chest tubes remain in place.  MAP a little higher in 90s today.    Yesterday milrinone weaned to 0.125 mcg/kg/min. Today's co-ox is 64%.  CVP 7.   Has pain at surgical site and in shoulders, controlled with meds.   He is jaundiced, total bilirubin increased.  Pre-op 4.2 => post-op 9.8 => 8.6.   He appears to have gone out of atrial flutter and back into sinus tachycardia on amiodarone, HR 100s.   LVAD INTERROGATION:  HeartMate II LVAD:  Flow 4.7 liters/min, speed 9000,  power 4.9, PI 7.7.  No PI events.   Objective:    Vital Signs:   Temp:  [97.8 F (36.6 C)-98.3 F (36.8 C)] 98.2 F (36.8 C) (02/04 0804) Pulse Rate:  [102-130] 106 (02/04 0800) Resp:  [0-28] 17 (02/04 0800) SpO2:  [94 %-98 %] 96 % (02/04 0800) Weight:  [183 lb 14.4 oz (83.416 kg)] 183 lb 14.4 oz (83.416 kg) (02/04 0748) Last BM Date: 05/06/15 Mean arterial Pressure 80s-90s  Intake/Output:   Intake/Output Summary (Last 24 hours) at 05/14/15 0910 Last data filed at 05/14/15 0900  Gross per 24 hour  Intake 2612.73 ml  Output   2075 ml  Net 537.73 ml     Physical Exam: CVP 7 General:  Jaundice. No resp difficulty. Sitting in the chair.  HEENT: normal Neck: supple. JVP not elevated. Carotids 2+ bilat; no bruits. No lymphadenopathy or thryomegaly appreciated. L subclavian TLC Cor: Mechanical heart sounds with LVAD hum present. Lungs: clear Abdomen: soft, nontender, nondistended. No hepatosplenomegaly. No bruits or masses. Good bowel sounds. Driveline: C/D/I; securement device intact and driveline incorporated Extremities: no cyanosis, clubbing, rash, edema Neuro: alert & orientedx3, cranial nerves grossly intact. moves all 4 extremities w/o difficulty. Affect pleasant  Telemetry: NSR  100s  Labs: Basic Metabolic Panel:  Recent Labs Lab 05/10/15 1520  05/11/15 0320  05/11/15 1610 05/12/15 0340 05/12/15 1612 05/13/15 0430 05/13/15 1635 05/14/15 0415  NA 137  < > 136  < >  --  133* 132* 133* 127* 128*  K 3.5  < > 4.5  < >  --  4.4 4.5 4.0 4.3 4.2  CL 100*  < > 102  < >  --  100* 95* 103 97* 93*  CO2 27  --  24  --   --  24  --  22  --  24  GLUCOSE 120*  < > 85  < >  --  99 116* 79 122* 103*  BUN 13  < > 13  < >  --  11 14 13 17 15   CREATININE 0.91  < > 0.77  < > 0.80 0.82 0.80 0.82 0.80 0.94  CALCIUM 7.7*  --  7.9*  --   --  8.5*  --  7.9*  --  8.9  MG 1.8  < > 2.3  --  2.0 1.9  --  1.6*  --  1.7  PHOS  --   --  2.6  --   --  3.5  --  2.8  --   --   < > = values in this interval not displayed.  Liver Function Tests:  Recent Labs Lab 05/09/15 0500 05/11/15 0320 05/12/15 0340 05/13/15 0430 05/14/15 0415  AST  68* 84* 92* 64* 68*  ALT 38 24 24 25 29   ALKPHOS 81 37* 38 48 74  BILITOT 3.5* 9.2* 12.9* 9.8* 8.2*  PROT 6.5 4.8* 4.9* 4.9* 5.4*  ALBUMIN 3.0* 3.1* 2.9* 2.5* 2.6*   No results for input(s): LIPASE, AMYLASE in the last 168 hours. No results for input(s): AMMONIA in the last 168 hours.  CBC:  Recent Labs Lab 05/11/15 0320  05/11/15 1610 05/12/15 0340 05/12/15 1612 05/13/15 0430 05/13/15 1635 05/14/15 0415  WBC 5.6  --  8.0 8.9  --  8.3  --  9.0  NEUTROABS 3.7  --   --  6.5  --  6.2  --  6.4  HGB 9.6*  < > 8.4* 8.6* 9.9* 8.2* 9.5* 8.9*  HCT 28.2*  < > 24.1* 24.4* 29.0* 23.9* 28.0* 25.7*  MCV 93.1  --  93.8 92.4  --  94.5  --  94.5  PLT 92*  --  91* 89*  --  100*  --  136*  < > = values in this interval not displayed.  INR:  Recent Labs Lab 05/10/15 1520 05/11/15 0320 05/12/15 0340 05/13/15 0430 05/14/15 0415  INR 1.35 1.57* 1.53* 1.33 1.37    Other results:  EKG:   Imaging: Dg Chest Port 1 View  05/14/2015  CLINICAL DATA:  Left ventricular assistance device. EXAM: PORTABLE CHEST 1 VIEW COMPARISON:  May 12, 2015.  FINDINGS: Left ventricular assistance device is again noted. Stable cardiomegaly is noted. Sternotomy wires are noted. Left-sided chest tube is unchanged in position with no definite pneumothorax. Right-sided pacemaker is unchanged in position. Stable left subclavian catheter line with distal tip in expected position of the SVC. No significant pleural effusion is noted. Left lingular subsegmental atelectasis is noted. IMPRESSION: Stable position of left-sided chest tube with no pneumothorax seen currently. Stable left lingular subsegmental atelectasis. Electronically Signed   By: Marijo Conception, M.D.   On: 05/14/2015 07:13   Dg Chest Port 1 View  05/12/2015  CLINICAL DATA:  Central line placement. EXAM: PORTABLE CHEST 1 VIEW COMPARISON:  Single view of the chest earlier today. FINDINGS: Right IJ approach Swan-Ganz catheter has been removed. Right IJ sheath is in place and projects at the junction of the brachiocephalic veins and superior vena cava. Left chest tube remains in place. Left ventricular assist device is partially visualized. There is a tiny left apical pneumothorax. No right pneumothorax is identified. Left basilar atelectasis is noted. IMPRESSION: Right IJ sheath projects at the junction of the brachiocephalic veins and superior vena cava. Negative for right pneumothorax. Tiny left apical pneumothorax with a left chest tube in place. Electronically Signed   By: Inge Rise M.D.   On: 05/12/2015 16:52     Medications:     Scheduled Medications: . antiseptic oral rinse  7 mL Mouth Rinse BID  . aspirin EC  325 mg Oral Daily   Or  . aspirin  324 mg Per Tube Daily   Or  . aspirin  300 mg Rectal Daily  . bisacodyl  10 mg Oral Daily   Or  . bisacodyl  10 mg Rectal Daily  . chlorpheniramine-HYDROcodone  5 mL Oral QHS  . citalopram  20 mg Oral Daily  . digoxin  0.125 mg Oral Daily  . docusate sodium  200 mg Oral Daily  . feeding supplement  1 Container Oral Q1400  . feeding  supplement (ENSURE ENLIVE)  237 mL Oral Daily  . ferrous Q000111Q C-folic acid  1 capsule Oral TID PC  . furosemide  40 mg Intravenous BID  . guaiFENesin  600 mg Oral BID  . insulin aspart  0-15 Units Subcutaneous TID WC  . insulin aspart  0-5 Units Subcutaneous QHS  . metoCLOPramide (REGLAN) injection  10 mg Intravenous 4 times per day  . pantoprazole  40 mg Oral Daily  . sodium chloride flush  3 mL Intravenous Q12H  . warfarin  4 mg Oral q1800  . Warfarin - Physician Dosing Inpatient   Does not apply q1800    Infusions: . sodium chloride 20 mL/hr at 05/12/15 0700  . sodium chloride 250 mL (05/11/15 0606)  . sodium chloride    . amiodarone 30 mg/hr (05/14/15 0900)  . lactated ringers 20 mL/hr at 05/13/15 0400  . lactated ringers Stopped (05/12/15 2000)    PRN Medications: sodium chloride, ALPRAZolam, hydrALAZINE, morphine injection, ondansetron (ZOFRAN) IV, oxyCODONE, sodium chloride flush, traMADol   Assessment:   1. Nonischemic cardiomyopathy: Long-standing, EF 20-25%.  Coronary angiography this admission with no significant CAD. Cardiogenic shock requiring milrinone and norepinephrine, IABP placed. Heartmate II LVAD placed 05/10/15.  2. Post-op blood loss anemia: 2 units PRBCs 1/31.  3. Elevate bilirubin: Noted pre-op, thought due to RV failure.  Higher post-op with jaundice.  4. Atrial flutter: Resolved on amiodarone.  5. Hyponatremia   Plan/Discussion:    Today's co-ox is 64%.  CVP 7.  Overall doing well.   - Can stop milrinone today.  - Can make Lasix po.   INR today 1.37. On 325 mg aspirin.  Can cut back to 81 mg daily once INR>2.  Hgb stable.     Chest tubes remain in place.  Pain is controlled.   Elevated total bilirubin.  Some elevation pre-op thought due to CHF, more marked post-op.  Trending down today but remains jaundiced.  Normal alkaline phosphatase.  LDH stable and not significantly elevated, so think significant hemolysis is not likely.   Will send direct bilirubin today.   Back in NSR on amiodarone.  Will get ECG to confirm, think sinus tachy in 100s this morning.  Continue amiodarone.   More hyponatremic today, will put in place 1800 cc po fluid restriction.   I reviewed the LVAD parameters from today, and compared the results to the patient's prior recorded data.  No programming changes were made.  The LVAD is functioning within specified parameters.  The patient performs LVAD self-test daily.  LVAD interrogation was negative for any significant power changes, alarms or PI events/speed drops.  LVAD equipment check completed and is in good working order.  Back-up equipment present.   LVAD education done on emergency procedures and precautions and reviewed exit site care.  Length of Stay: [place]  05/14/2015, 9:10 AM  VAD Team --- VAD ISSUES ONLY--- Pager [phone] (7am - 7am)  Advanced Heart Failure Team  Pager [phone] (M-F; 7a - 4p)  Please contact [facility] for night-coverage after hours (4p -7a ) and weekends on [email or web address]

## 2015-05-14 NOTE — Progress Notes (Signed)
HeartMate 2 Rounding Note     Postop day #4 HeartMate 2 LVAD implantation   Subjective:   History of nonischemic cardiomyopathy, ejection fraction 15% Preoperative cardiogenic shock on balloon pump and dual inotropes Preoperative protein deficiency malnutrition with prealbumin 8.4 Preoperative moderate RV dysfunction Preoperative hepatic insufficiency with bilirubin of 4.2  Status post AICD implantation 2002  The patient has continued to progress after surgery. He was extubated postop day 1. He is ambulating in the hallway. His VAD parameters have remained satisfactory at 9000 rpm. He has weaned off nitric oxide as well as epinephrine with stable RV performance. He is maintaining a sinus rhythm.His milrinone is weaned off. Last Co-ox > 60%  Chest tube drainage remainspersistent but serous and the left pleural, mediastinal, and pocket drains remain.Small air leak from L PT. He is diuresing fairly well and has lost fluid weight daily. Pain is well-controlled and neuro status is stable INR today is 1.4, Coumadin ordered now 6 mg His postop bilirubin hasimproved4.2 >> 8>>`12  >> 9  >> 8 probably due to shock liver preop with 1 hour pump time for VAD  He developed A-flutter which has converted to NSR on amiodarone   LVAD INTERROGATION:  HeartMate II LVAD:  Flow 4.8 liters/min, speed 9000, power 5.2 PI 6.8  Controller Serial intact   Objective:    Vital Signs:   Temp:  [97.8 F (36.6 C)-98.3 F (36.8 C)] 98.2 F (36.8 C) (02/04 0804) Pulse Rate:  [102-127] 109 (02/04 0900) Resp:  [0-26] 19 (02/04 0900) SpO2:  [94 %-98 %] 97 % (02/04 0900) Weight:  [183 lb 14.4 oz (83.416 kg)] 183 lb 14.4 oz (83.416 kg) (02/04 0748) Last BM Date: 05/06/15 Mean arterial Pressure  75-85  Intake/Output:   Intake/Output Summary (Last 24 hours) at 05/14/15 1007 Last data filed at 05/14/15 0900  Gross per 24 hour  Intake 2826.73 ml  Output   2000 ml  Net 826.73 ml     Physical Exam: General:   Well appearing. No resp difficulty, icteric facies HEENT: normal Neck: supple. JVP . Carotids 2+ bilat; no bruits. No lymphadenopathy or thryomegaly appreciated. Cor: Mechanical heart sounds with LVAD hum present. Lungs: clear Abdomen: soft, nontender, nondistended. No hepatosplenomegaly. No bruits or masses. Good bowel sounds. Extremities: no cyanosis, clubbing, rash, edema Neuro: alert & orientedx3, cranial nerves grossly intact. moves all 4 extremities w/o difficulty. Affect pleasant  Telemetry: sinus tach  Labs: Basic Metabolic Panel:  Recent Labs Lab 05/10/15 1520  05/11/15 0320  05/11/15 1610 05/12/15 0340 05/12/15 1612 05/13/15 0430 05/13/15 1635 05/14/15 0415  NA 137  < > 136  < >  --  133* 132* 133* 127* 128*  K 3.5  < > 4.5  < >  --  4.4 4.5 4.0 4.3 4.2  CL 100*  < > 102  < >  --  100* 95* 103 97* 93*  CO2 27  --  24  --   --  24  --  22  --  24  GLUCOSE 120*  < > 85  < >  --  99 116* 79 122* 103*  BUN 13  < > 13  < >  --  11 14 13 17 15   CREATININE 0.91  < > 0.77  < > 0.80 0.82 0.80 0.82 0.80 0.94  CALCIUM 7.7*  --  7.9*  --   --  8.5*  --  7.9*  --  8.9  MG 1.8  < > 2.3  --  2.0 1.9  --  1.6*  --  1.7  PHOS  --   --  2.6  --   --  3.5  --  2.8  --   --   < > = values in this interval not displayed.  Liver Function Tests:  Recent Labs Lab 05/09/15 0500 05/11/15 0320 05/12/15 0340 05/13/15 0430 05/14/15 0415  AST 68* 84* 92* 64* 68*  ALT 38 24 24 25 29   ALKPHOS 81 37* 38 48 74  BILITOT 3.5* 9.2* 12.9* 9.8* 8.2*  PROT 6.5 4.8* 4.9* 4.9* 5.4*  ALBUMIN 3.0* 3.1* 2.9* 2.5* 2.6*   No results for input(s): LIPASE, AMYLASE in the last 168 hours. No results for input(s): AMMONIA in the last 168 hours.  CBC:  Recent Labs Lab 05/11/15 0320  05/11/15 1610 05/12/15 0340 05/12/15 1612 05/13/15 0430 05/13/15 1635 05/14/15 0415  WBC 5.6  --  8.0 8.9  --  8.3  --  9.0  NEUTROABS 3.7  --   --  6.5  --  6.2  --  6.4  HGB 9.6*  < > 8.4* 8.6* 9.9* 8.2* 9.5*  8.9*  HCT 28.2*  < > 24.1* 24.4* 29.0* 23.9* 28.0* 25.7*  MCV 93.1  --  93.8 92.4  --  94.5  --  94.5  PLT 92*  --  91* 89*  --  100*  --  136*  < > = values in this interval not displayed.  INR:  Recent Labs Lab 05/10/15 1520 05/11/15 0320 05/12/15 0340 05/13/15 0430 05/14/15 0415  INR 1.35 1.57* 1.53* 1.33 1.37    Other results:  EKG:   Imaging: Dg Chest Port 1 View  05/14/2015  CLINICAL DATA:  Left ventricular assistance device. EXAM: PORTABLE CHEST 1 VIEW COMPARISON:  May 12, 2015. FINDINGS: Left ventricular assistance device is again noted. Stable cardiomegaly is noted. Sternotomy wires are noted. Left-sided chest tube is unchanged in position with no definite pneumothorax. Right-sided pacemaker is unchanged in position. Stable left subclavian catheter line with distal tip in expected position of the SVC. No significant pleural effusion is noted. Left lingular subsegmental atelectasis is noted. IMPRESSION: Stable position of left-sided chest tube with no pneumothorax seen currently. Stable left lingular subsegmental atelectasis. Electronically Signed   By: Marijo Conception, M.D.   On: 05/14/2015 07:13   Dg Chest Port 1 View  05/12/2015  CLINICAL DATA:  Central line placement. EXAM: PORTABLE CHEST 1 VIEW COMPARISON:  Single view of the chest earlier today. FINDINGS: Right IJ approach Swan-Ganz catheter has been removed. Right IJ sheath is in place and projects at the junction of the brachiocephalic veins and superior vena cava. Left chest tube remains in place. Left ventricular assist device is partially visualized. There is a tiny left apical pneumothorax. No right pneumothorax is identified. Left basilar atelectasis is noted. IMPRESSION: Right IJ sheath projects at the junction of the brachiocephalic veins and superior vena cava. Negative for right pneumothorax. Tiny left apical pneumothorax with a left chest tube in place. Electronically Signed   By: Inge Rise M.D.   On:  05/12/2015 16:52     Medications:     Scheduled Medications: . antiseptic oral rinse  7 mL Mouth Rinse BID  . aspirin EC  325 mg Oral Daily   Or  . aspirin  324 mg Per Tube Daily   Or  . aspirin  300 mg Rectal Daily  . bisacodyl  10 mg Oral Daily   Or  .  bisacodyl  10 mg Rectal Daily  . chlorpheniramine-HYDROcodone  5 mL Oral QHS  . citalopram  20 mg Oral Daily  . cyclobenzaprine  10 mg Oral QHS  . digoxin  0.125 mg Oral Daily  . docusate sodium  200 mg Oral Daily  . feeding supplement  1 Container Oral Q1400  . feeding supplement (ENSURE ENLIVE)  237 mL Oral Daily  . ferrous Q000111Q C-folic acid  1 capsule Oral TID PC  . furosemide  40 mg Oral BID  . guaiFENesin  600 mg Oral BID  . insulin aspart  0-15 Units Subcutaneous TID WC  . metoCLOPramide  10 mg Oral TID AC  . pantoprazole  40 mg Oral Daily  . sodium chloride flush  3 mL Intravenous Q12H  . warfarin  6 mg Oral q1800  . Warfarin - Physician Dosing Inpatient   Does not apply q1800    Infusions: . sodium chloride 20 mL/hr at 05/12/15 0700  . sodium chloride 250 mL (05/11/15 0606)  . sodium chloride    . amiodarone 30 mg/hr (05/14/15 0900)  . lactated ringers 20 mL/hr at 05/13/15 0400  . lactated ringers Stopped (05/12/15 2000)    PRN Medications: sodium chloride, ALPRAZolam, hydrALAZINE, morphine injection, ondansetron (ZOFRAN) IV, oxyCODONE, sodium chloride flush, traMADol   Assessment:  Left subclavian triple-lumen catheter placed 2-2 after attempt at PICC line was unsuccessful Small intermittent air leak from left pleural chest tube persists, on water seal Serous drainage from MT and pocket drains with stable Hb Advancing diet Coumadin 6mg  ordered-- INR 1.4   Plan/Discussion:   Continue diuresis with po lasix - cvp < 10 Leave drains for output > 400 ml in 24 hr Advance diet, maximize protein intake   Follow bilirubin Continue with physical therapy Treat A-flutter with iv amio, convert  to po tomorrow DC foley tomorrow Start iv heparin in am if INR not increasing   I reviewed the LVAD parameters from today, and compared the results to the patient's prior recorded data.  No programming changes were made.  The LVAD is functioning within specified parameters.  The patient performs LVAD self-test daily.  LVAD interrogation was negative for any significant power changes, alarms or PI events/speed drops.  LVAD equipment check completed and is in good working order.  Back-up equipment present.   LVAD education done on emergency procedures and precautions and reviewed exit site care.  Length of Stay: [place] [facility] 05/14/2015, 10:07 AM

## 2015-05-15 ENCOUNTER — Inpatient Hospital Stay (HOSPITAL_COMMUNITY): Payer: Medicare Other

## 2015-05-15 LAB — COMPREHENSIVE METABOLIC PANEL
ALT: 30 U/L (ref 17–63)
AST: 66 U/L — ABNORMAL HIGH (ref 15–41)
Albumin: 3 g/dL — ABNORMAL LOW (ref 3.5–5.0)
Alkaline Phosphatase: 89 U/L (ref 38–126)
Anion gap: 9 (ref 5–15)
BUN: 16 mg/dL (ref 6–20)
CO2: 25 mmol/L (ref 22–32)
Calcium: 8.6 mg/dL — ABNORMAL LOW (ref 8.9–10.3)
Chloride: 93 mmol/L — ABNORMAL LOW (ref 101–111)
Creatinine, Ser: 0.82 mg/dL (ref 0.61–1.24)
GFR calc Af Amer: >60 mL/min (ref >60)
GFR calc non Af Amer: >60 mL/min (ref >60)
Glucose, Bld: 91 mg/dL (ref 65–99)
Potassium: 3.8 mmol/L (ref 3.5–5.1)
Sodium: 127 mmol/L — ABNORMAL LOW (ref 135–145)
Total Bilirubin: 7.4 mg/dL — ABNORMAL HIGH (ref 0.3–1.2)
Total Protein: 5.6 g/dL — ABNORMAL LOW (ref 6.5–8.1)

## 2015-05-15 LAB — CARBOXYHEMOGLOBIN
Carboxyhemoglobin: 1.7 % — ABNORMAL HIGH (ref 0.5–1.5)
Methemoglobin: 0.7 % (ref 0.0–1.5)
O2 Saturation: 53.7 %
Total hemoglobin: 9 g/dL — ABNORMAL LOW (ref 13.5–18.0)

## 2015-05-15 LAB — CBC WITH DIFFERENTIAL/PLATELET
Basophils Absolute: 0 10*3/uL (ref 0.0–0.1)
Basophils Relative: 0 %
Eosinophils Absolute: 0.2 10*3/uL (ref 0.0–0.7)
Eosinophils Relative: 3 %
HCT: 25.3 % — ABNORMAL LOW (ref 39.0–52.0)
Hemoglobin: 8.8 g/dL — ABNORMAL LOW (ref 13.0–17.0)
Lymphocytes Relative: 13 %
Lymphs Abs: 1 10*3/uL (ref 0.7–4.0)
MCH: 32.6 pg (ref 26.0–34.0)
MCHC: 34.8 g/dL (ref 30.0–36.0)
MCV: 93.7 fL (ref 78.0–100.0)
Monocytes Absolute: 1.4 10*3/uL — ABNORMAL HIGH (ref 0.1–1.0)
Monocytes Relative: 18 %
Neutro Abs: 4.9 10*3/uL (ref 1.7–7.7)
Neutrophils Relative %: 66 %
Platelets: 126 10*3/uL — ABNORMAL LOW (ref 150–400)
RBC: 2.7 MIL/uL — ABNORMAL LOW (ref 4.22–5.81)
RDW: 16.6 % — ABNORMAL HIGH (ref 11.5–15.5)
WBC: 7.5 10*3/uL (ref 4.0–10.5)

## 2015-05-15 LAB — PROTIME-INR
INR: 2.54 — ABNORMAL HIGH (ref 0.00–1.49)
Prothrombin Time: 27 seconds — ABNORMAL HIGH (ref 11.6–15.2)

## 2015-05-15 LAB — MAGNESIUM: Magnesium: 1.7 mg/dL (ref 1.7–2.4)

## 2015-05-15 LAB — LACTATE DEHYDROGENASE: LDH: 246 U/L — ABNORMAL HIGH (ref 98–192)

## 2015-05-15 MED ORDER — ALPRAZOLAM 0.25 MG PO TABS: 0.2500 mg | ORAL_TABLET | Freq: Every evening | ORAL | Status: DC | PRN

## 2015-05-15 MED ORDER — AMIODARONE HCL 200 MG PO TABS
200.0000 mg | ORAL_TABLET | Freq: Two times a day (BID) | ORAL | Status: DC
  Administered 2015-05-15: 200 mg via ORAL
  Filled 2015-05-15: qty 1

## 2015-05-15 MED ORDER — FUROSEMIDE 10 MG/ML IJ SOLN: 40.0000 mg | Freq: Every day | INTRAMUSCULAR | Status: DC

## 2015-05-15 MED ORDER — ASPIRIN EC 81 MG PO TBEC
81.0000 mg | DELAYED_RELEASE_TABLET | Freq: Every day | ORAL | Status: DC
  Administered 2015-05-16 – 2015-05-30 (×15): 81 mg via ORAL
  Filled 2015-05-15 (×15): qty 1

## 2015-05-15 MED ORDER — MILRINONE IN DEXTROSE 20 MG/100ML IV SOLN
0.1250 ug/kg/min | INTRAVENOUS | Status: DC
  Administered 2015-05-15: 0.25 ug/kg/min via INTRAVENOUS
  Administered 2015-05-17: 0.125 ug/kg/min via INTRAVENOUS
  Filled 2015-05-15 (×3): qty 100

## 2015-05-15 MED ORDER — ALBUMIN HUMAN 25 % IV SOLN
12.5000 g | Freq: Four times a day (QID) | INTRAVENOUS | Status: AC
  Administered 2015-05-15 – 2015-05-16 (×2): 12.5 g via INTRAVENOUS
  Filled 2015-05-15 (×2): qty 50

## 2015-05-15 MED ORDER — LISINOPRIL 10 MG PO TABS: 5.0000 mg | ORAL_TABLET | Freq: Every day | ORAL | Status: DC

## 2015-05-15 MED ORDER — SORBITOL 70 % PO SOLN
60.0000 mL | Freq: Every morning | ORAL | Status: DC
  Administered 2015-05-15: 60 mL via ORAL
  Filled 2015-05-15 (×2): qty 60

## 2015-05-15 MED ORDER — TEMAZEPAM 15 MG PO CAPS
15.0000 mg | ORAL_CAPSULE | Freq: Every evening | ORAL | Status: DC | PRN
  Administered 2015-05-15: 15 mg via ORAL
  Filled 2015-05-15: qty 1

## 2015-05-15 MED ORDER — WARFARIN SODIUM 2.5 MG PO TABS
2.5000 mg | ORAL_TABLET | Freq: Every day | ORAL | Status: DC
  Administered 2015-05-15: 2.5 mg via ORAL
  Filled 2015-05-15: qty 1

## 2015-05-15 MED ORDER — FUROSEMIDE 10 MG/ML IJ SOLN
40.0000 mg | Freq: Every day | INTRAMUSCULAR | Status: DC
  Administered 2015-05-15: 40 mg via INTRAVENOUS
  Filled 2015-05-15: qty 4

## 2015-05-15 MED ORDER — SODIUM CHLORIDE 0.9 % IV SOLN
INTRAVENOUS | Status: DC
  Administered 2015-05-15: 21:00:00 via INTRAVENOUS

## 2015-05-15 MED ORDER — LISINOPRIL 10 MG PO TABS
10.0000 mg | ORAL_TABLET | Freq: Every day | ORAL | Status: DC
  Administered 2015-05-15: 10 mg via ORAL
  Filled 2015-05-15: qty 1

## 2015-05-15 NOTE — Progress Notes (Signed)
Patient ID: Patrick Humphrey, male   DOB: [birth year], 50 y.o.   MRN: [id number] HeartMate 2 Rounding Note  Subjective:    LVAD placement 05/10/15.   Extubated 2/1.  Got 2 units PRBCs 1/31. Hemoglobin 8.6>8.2>8.9.  Chest tubes remain in place.  MAP a little higher in 90s today.    Yesterday milrinone stopped. Today's co-ox is 54%. Milrinone restarted at 0.25 by Dr. Prescott Gum,   Feels sluggish and frustrated. Denies SOB. Had good BM.  MT and pocket tube still in.   Bilirubin coming down. Now 7.2 (mostly direct = liver source not hemolysis). Renal function stable. INR 1.37 -> 2.54. Hgb stable.     LVAD INTERROGATION:  HeartMate II LVAD:  Flow 5.0 liters/min, speed 9000,  power 5.2, PI 6.4  About 10 PI events today.   Objective:    Vital Signs:   Temp:  [97.7 F (36.5 C)-98.2 F (36.8 C)] 97.7 F (36.5 C) (02/05 1100) Pulse Rate:  [93-109] 109 (02/05 1200) Resp:  [12-27] 24 (02/05 1200) SpO2:  [95 %-100 %] 95 % (02/05 1200) Weight:  [79 kg (174 lb 2.6 oz)] 79 kg (174 lb 2.6 oz) (02/05 0600) Last BM Date: 05/06/15 Mean arterial Pressure 80s-90s  Intake/Output:   Intake/Output Summary (Last 24 hours) at 05/15/15 1232 Last data filed at 05/15/15 1200  Gross per 24 hour  Intake 1736.49 ml  Output   1780 ml  Net -43.51 ml     Physical Exam: General:  Jaundice. No resp difficulty. Lying in bed  HEENT: normal Neck: supple. JVP not elevated. Carotids 2+ bilat; no bruits. No lymphadenopathy or thryomegaly appreciated. L subclavian TLC Cor: Mechanical heart sounds with LVAD hum present. + CTs Lungs: clear Abdomen: soft, nontender, nondistended. No hepatosplenomegaly. No bruits or masses. Good bowel sounds. Driveline: C/D/I; securement device intact and driveline incorporated Extremities: no cyanosis, clubbing, rash, edema Neuro: alert & orientedx3, cranial nerves grossly intact. moves all 4 extremities w/o difficulty. Affect pleasant  Telemetry: NSR 100s  Labs: Basic  Metabolic Panel:  Recent Labs Lab 05/11/15 0320  05/11/15 1610 05/12/15 0340 05/12/15 1612 05/13/15 0430 05/13/15 1635 05/14/15 0415 05/15/15 0405  NA 136  < >  --  133* 132* 133* 127* 128* 127*  K 4.5  < >  --  4.4 4.5 4.0 4.3 4.2 3.8  CL 102  < >  --  100* 95* 103 97* 93* 93*  CO2 24  --   --  24  --  22  --  24 25  GLUCOSE 85  < >  --  99 116* 79 122* 103* 91  BUN 13  < >  --  11 14 13 17 15 16   CREATININE 0.77  < > 0.80 0.82 0.80 0.82 0.80 0.94 0.82  CALCIUM 7.9*  --   --  8.5*  --  7.9*  --  8.9 8.6*  MG 2.3  --  2.0 1.9  --  1.6*  --  1.7 1.7  PHOS 2.6  --   --  3.5  --  2.8  --   --   --   < > = values in this interval not displayed.  Liver Function Tests:  Recent Labs Lab 05/11/15 0320 05/12/15 0340 05/13/15 0430 05/14/15 0415 05/15/15 0405  AST 84* 92* 64* 68* 66*  ALT 24 24 25 29 30   ALKPHOS 37* 38 48 74 89  BILITOT 9.2* 12.9* 9.8* 8.2* 7.4*  PROT 4.8* 4.9* 4.9* 5.4* 5.6*  ALBUMIN 3.1* 2.9* 2.5* 2.6* 3.0*   No results for input(s): LIPASE, AMYLASE in the last 168 hours. No results for input(s): AMMONIA in the last 168 hours.  CBC:  Recent Labs Lab 05/11/15 0320  05/11/15 1610 05/12/15 0340 05/12/15 1612 05/13/15 0430 05/13/15 1635 05/14/15 0415 05/15/15 0405  WBC 5.6  --  8.0 8.9  --  8.3  --  9.0 7.5  NEUTROABS 3.7  --   --  6.5  --  6.2  --  6.4 4.9  HGB 9.6*  < > 8.4* 8.6* 9.9* 8.2* 9.5* 8.9* 8.8*  HCT 28.2*  < > 24.1* 24.4* 29.0* 23.9* 28.0* 25.7* 25.3*  MCV 93.1  --  93.8 92.4  --  94.5  --  94.5 93.7  PLT 92*  --  91* 89*  --  100*  --  136* 126*  < > = values in this interval not displayed.  INR:  Recent Labs Lab 05/11/15 0320 05/12/15 0340 05/13/15 0430 05/14/15 0415 05/15/15 0405  INR 1.57* 1.53* 1.33 1.37 2.54*    Other results:  :   Imaging: Dg Chest Port 1 View  05/15/2015  CLINICAL DATA:  LVAD present. Gx of CHF. Pacemaker insertion in 2009. Pt is a former smoker. EXAM: PORTABLE CHEST 1 VIEW COMPARISON:  05/14/2015  FINDINGS: RIGHT-sided pacemaker and LVAD device noted. LEFT chest tube in place with of pneumothorax. Stable cardiomegaly. No pulmonary edema. LEFT basilar atelectasis. IMPRESSION: 1. No interval change.  Cardiomegaly and basilar atelectasis. 2. No pulmonary edema. 3. LEFT chest tube in place without pneumothorax. Electronically Signed   By: Suzy Bouchard M.D.   On: 05/15/2015 07:38   Dg Chest Port 1 View  05/14/2015  CLINICAL DATA:  Left ventricular assistance device. EXAM: PORTABLE CHEST 1 VIEW COMPARISON:  May 12, 2015. FINDINGS: Left ventricular assistance device is again noted. Stable cardiomegaly is noted. Sternotomy wires are noted. Left-sided chest tube is unchanged in position with no definite pneumothorax. Right-sided pacemaker is unchanged in position. Stable left subclavian catheter line with distal tip in expected position of the SVC. No significant pleural effusion is noted. Left lingular subsegmental atelectasis is noted. IMPRESSION: Stable position of left-sided chest tube with no pneumothorax seen currently. Stable left lingular subsegmental atelectasis. Electronically Signed   By: Marijo Conception, M.D.   On: 05/14/2015 07:13     Medications:     Scheduled Medications: . amiodarone  200 mg Oral BID  . antiseptic oral rinse  7 mL Mouth Rinse BID  . aspirin EC  81 mg Oral Daily  . bisacodyl  10 mg Oral Daily   Or  . bisacodyl  10 mg Rectal Daily  . chlorpheniramine-HYDROcodone  5 mL Oral QHS  . citalopram  20 mg Oral Daily  . digoxin  0.125 mg Oral Daily  . docusate sodium  200 mg Oral Daily  . feeding supplement  1 Container Oral Q1400  . feeding supplement (ENSURE ENLIVE)  237 mL Oral Daily  . ferrous Q000111Q C-folic acid  1 capsule Oral TID PC  . furosemide  40 mg Intravenous Daily  . guaiFENesin  600 mg Oral BID  . lisinopril  10 mg Oral Daily  . metoCLOPramide  10 mg Oral TID AC  . pantoprazole  40 mg Oral Daily  . sodium chloride flush  10 mL  Intravenous Q12H  . sorbitol  60 mL Oral q morning - 10a  . warfarin  2.5 mg Oral q1800  . Warfarin - Physician  Dosing Inpatient   Does not apply q1800    Infusions: . sodium chloride 20 mL/hr at 05/12/15 0700  . sodium chloride 250 mL (05/11/15 0606)  . sodium chloride    . milrinone 0.25 mcg/kg/min (05/15/15 1200)    PRN Medications: sodium chloride, ALPRAZolam, hydrALAZINE, ondansetron (ZOFRAN) IV, oxyCODONE, sodium chloride flush, traMADol   Assessment:   1. Nonischemic cardiomyopathy: Long-standing, EF 20-25%.  Coronary angiography this admission with no significant CAD. Cardiogenic shock requiring milrinone and norepinephrine, IABP placed. Heartmate II LVAD placed 05/10/15.  2. Post-op blood loss anemia: 2 units PRBCs 1/31.  3. Elevated bilirubin: Noted pre-op, thought due to RV failure. Mostly direct so cholestatic picture and not hemolysis  4. Atrial flutter: Resolved on amiodarone.  5. Hyponatremia   Plan/Discussion:    Co-ox lower off milrinone. Borderline at 54%.Has been restarted by Dr. Prescott Gum. Will try to wean slowly  INR today 1.37 -> 2.54. Cut ASA back to 81. Dosing per TCTS. Likely needs to be held or cut back.   Hyperbilirubinemia - improving. Mostly direct so c/w cholestatic source and not hemolysis. LDH ok.   Back in NSR on amiodarone.  Will continue.  Hyponatremia stable. On free H2O restriction  Continue to ambulate.    I reviewed the LVAD parameters from today, and compared the results to the patient's prior recorded data.  No programming changes were made.  The LVAD is functioning within specified parameters.  The patient performs LVAD self-test daily.  LVAD interrogation was negative for any significant power changes, alarms or PI events/speed drops.  LVAD equipment check completed and is in good working order.  Back-up equipment present.   LVAD education done on emergency procedures and precautions and reviewed exit site care.  Length of Stay:  16  Glori Bickers MD  05/15/2015, 12:32 PM  VAD Team --- VAD ISSUES ONLY--- Pager [phone] (7am - 7am)  Advanced Heart Failure Team  Pager [phone] (M-F; 7a - 4p)  Please contact [facility] for night-coverage after hours (4p -7a ) and weekends on [email or web address]

## 2015-05-16 ENCOUNTER — Encounter: Payer: Medicare Other | Admitting: *Deleted

## 2015-05-16 ENCOUNTER — Inpatient Hospital Stay (HOSPITAL_COMMUNITY): Payer: Medicare Other

## 2015-05-16 ENCOUNTER — Telehealth: Payer: Self-pay | Admitting: Cardiology

## 2015-05-16 ENCOUNTER — Encounter: Payer: Self-pay | Admitting: Licensed Clinical Social Worker

## 2015-05-16 LAB — CBC WITH DIFFERENTIAL/PLATELET
Basophils Absolute: 0 10*3/uL (ref 0.0–0.1)
Basophils Relative: 0 %
Eosinophils Absolute: 0.2 10*3/uL (ref 0.0–0.7)
Eosinophils Relative: 2 %
HCT: 25.5 % — ABNORMAL LOW (ref 39.0–52.0)
Hemoglobin: 9 g/dL — ABNORMAL LOW (ref 13.0–17.0)
Lymphocytes Relative: 12 %
Lymphs Abs: 1.1 10*3/uL (ref 0.7–4.0)
MCH: 33 pg (ref 26.0–34.0)
MCHC: 35.3 g/dL (ref 30.0–36.0)
MCV: 93.4 fL (ref 78.0–100.0)
Monocytes Absolute: 1.7 10*3/uL — ABNORMAL HIGH (ref 0.1–1.0)
Monocytes Relative: 18 %
Neutro Abs: 6.5 10*3/uL (ref 1.7–7.7)
Neutrophils Relative %: 68 %
Platelets: 178 10*3/uL (ref 150–400)
RBC: 2.73 MIL/uL — ABNORMAL LOW (ref 4.22–5.81)
RDW: 17 % — ABNORMAL HIGH (ref 11.5–15.5)
WBC: 9.6 10*3/uL (ref 4.0–10.5)

## 2015-05-16 LAB — PROTIME-INR
INR: 3.22 — ABNORMAL HIGH (ref 0.00–1.49)
Prothrombin Time: 32.3 seconds — ABNORMAL HIGH (ref 11.6–15.2)

## 2015-05-16 LAB — CARBOXYHEMOGLOBIN
Carboxyhemoglobin: 2.5 % — ABNORMAL HIGH (ref 0.5–1.5)
Carboxyhemoglobin: 2.1 % — ABNORMAL HIGH (ref 0.5–1.5)
Methemoglobin: 0.7 % (ref 0.0–1.5)
Methemoglobin: 1 % (ref 0.0–1.5)
O2 Saturation: 61.2 %
O2 Saturation: 53.3 %
Total hemoglobin: 8.7 g/dL — ABNORMAL LOW (ref 13.5–18.0)
Total hemoglobin: 8.8 g/dL — ABNORMAL LOW (ref 13.5–18.0)

## 2015-05-16 LAB — COMPREHENSIVE METABOLIC PANEL
ALT: 26 U/L (ref 17–63)
AST: 57 U/L — ABNORMAL HIGH (ref 15–41)
Albumin: 2.6 g/dL — ABNORMAL LOW (ref 3.5–5.0)
Alkaline Phosphatase: 91 U/L (ref 38–126)
Anion gap: 11 (ref 5–15)
BUN: 12 mg/dL (ref 6–20)
CO2: 24 mmol/L (ref 22–32)
Calcium: 8 mg/dL — ABNORMAL LOW (ref 8.9–10.3)
Chloride: 92 mmol/L — ABNORMAL LOW (ref 101–111)
Creatinine, Ser: 0.76 mg/dL (ref 0.61–1.24)
GFR calc Af Amer: >60 mL/min (ref >60)
GFR calc non Af Amer: >60 mL/min (ref >60)
Glucose, Bld: 78 mg/dL (ref 65–99)
Potassium: 3.3 mmol/L — ABNORMAL LOW (ref 3.5–5.1)
Sodium: 127 mmol/L — ABNORMAL LOW (ref 135–145)
Total Bilirubin: 6.2 mg/dL — ABNORMAL HIGH (ref 0.3–1.2)
Total Protein: 5.3 g/dL — ABNORMAL LOW (ref 6.5–8.1)

## 2015-05-16 LAB — LACTATE DEHYDROGENASE: LDH: 235 U/L — ABNORMAL HIGH (ref 98–192)

## 2015-05-16 LAB — MAGNESIUM: Magnesium: 1.6 mg/dL — ABNORMAL LOW (ref 1.7–2.4)

## 2015-05-16 MED ORDER — MEGESTROL ACETATE 20 MG PO TABS
20.0000 mg | ORAL_TABLET | Freq: Every day | ORAL | Status: DC
  Administered 2015-05-16 – 2015-05-24 (×9): 20 mg via ORAL
  Filled 2015-05-16 (×11): qty 1

## 2015-05-16 MED ORDER — MAGNESIUM SULFATE 2 GM/50ML IV SOLN
2.0000 g | Freq: Once | INTRAVENOUS | Status: AC
  Administered 2015-05-16: 2 g via INTRAVENOUS
  Filled 2015-05-16: qty 50

## 2015-05-16 MED ORDER — WARFARIN SODIUM 1 MG PO TABS
1.0000 mg | ORAL_TABLET | Freq: Every day | ORAL | Status: DC
  Administered 2015-05-16: 1 mg via ORAL
  Filled 2015-05-16 (×2): qty 1

## 2015-05-16 MED ORDER — BOOST / RESOURCE BREEZE PO LIQD
1.0000 | Freq: Three times a day (TID) | ORAL | Status: DC
  Administered 2015-05-18 – 2015-05-30 (×12): 1 via ORAL
  Filled 2015-05-16: qty 1

## 2015-05-16 MED ORDER — ZOLPIDEM TARTRATE 5 MG PO TABS
5.0000 mg | ORAL_TABLET | Freq: Every evening | ORAL | Status: DC | PRN
  Administered 2015-05-16 – 2015-05-24 (×2): 5 mg via ORAL
  Filled 2015-05-16 (×2): qty 1

## 2015-05-16 MED ORDER — PANTOPRAZOLE SODIUM 40 MG IV SOLR
40.0000 mg | INTRAVENOUS | Status: DC
  Administered 2015-05-16: 40 mg via INTRAVENOUS
  Filled 2015-05-16: qty 40

## 2015-05-16 MED ORDER — AMIODARONE HCL 200 MG PO TABS
200.0000 mg | ORAL_TABLET | Freq: Two times a day (BID) | ORAL | Status: DC
  Administered 2015-05-16 (×2): 200 mg via ORAL
  Filled 2015-05-16 (×2): qty 1

## 2015-05-16 MED ORDER — POTASSIUM CHLORIDE CRYS ER 20 MEQ PO TBCR
40.0000 meq | EXTENDED_RELEASE_TABLET | Freq: Once | ORAL | Status: AC
  Administered 2015-05-16: 40 meq via ORAL

## 2015-05-16 MED ORDER — AMIODARONE HCL 200 MG PO TABS: 200.0000 mg | ORAL_TABLET | Freq: Two times a day (BID) | ORAL | Status: DC

## 2015-05-16 NOTE — Telephone Encounter (Signed)
Spoke with pt and reminded pt of remote transmission that is due today. Pt verbalized understanding.   

## 2015-05-16 NOTE — Progress Notes (Addendum)
Nutrition Follow-up  DOCUMENTATION CODES:   Not applicable  INTERVENTION:   D/C Ensure Enlive  Boost Breeze po TID between meals, each supplement provides 250 kcal and 9 grams of protein  Magic cup po TID with meals, each supplement provides 290 kcal and 9 grams of protein  NUTRITION DIAGNOSIS:   Increased nutrient needs related to  (post-op healing) as evidenced by estimated needs, ongoing  GOAL:   Patient will meet greater than or equal to 90% of their needs, progressing  MONITOR:   PO intake, Supplement acceptance, Labs, Weight trends, I & O's  ASSESSMENT:   50 year old male with long-standing history of chronic systolic heart failure, nonischemic cardiomyopathy, ICD, Dr. Gwenlyn Found, who has been experiencing increasing dyspnea consistent with acute on chronic systolic heart failure.  Patient s/p procedure 1/31: INSERTION OF HEARTMATE II IMPLANTABLE LEFT VENTRICULAR ASSIST DEVICE   Patient extubated 2/1.     Pt currently being transferred to 2W.  Chart reviewed.  Appetite is limited with nausea.  PO intake 50% per flowsheet records.  Started on Megace today.  This RD received message from co-RD that RN from 2S reported Ensure Jeanne Ivan is making the patient cough and produce a lot of mucus.  He is also receiving Boost Breeze (clear liquid supplement).  Will add Magic Cup as well to his meal trays.  Diet Order:  Diet Heart Room service appropriate?: Yes; Fluid consistency:: Thin; Fluid restriction:: 1800 mL Fluid  Skin:  Reviewed, no issues  Last BM:  2/5  Height:   Ht Readings from Last 1 Encounters:  05/08/15 5\' 11"  (1.803 m)    Weight:   Wt Readings from Last 1 Encounters:  05/16/15 172 lb 6.4 oz (78.2 kg)    Ideal Body Weight:  78.2 kg  BMI:  Body mass index is 24.06 kg/(m^2).  Estimated Nutritional Needs:   Kcal:  1800-2000  Protein:  85-100 grams  Fluid:  1.8-2.0 L  EDUCATION NEEDS:   No education needs identified at this time  Arthur Holms,  RD, LDN Pager #: [phone] After-Hours Pager #: [phone]

## 2015-05-16 NOTE — Progress Notes (Signed)
CSW met with patient at bedside. Patient reports he has some ups and downs over the weekend mostly around medication adjustment he noted. Patient pleased with progress and will be transferred to regular this afternoon. Patient verbalizes feeling improved and looking forward to discharge teaching anf progresing towards home. CSW will continue to follow for support throughout recovery. Raquel Sarna, [place]

## 2015-05-16 NOTE — Progress Notes (Signed)
Physical Therapy Treatment Patient Details Name: Patrick Humphrey MRN: [id number] DOB: 24-Sep-1965 Today's Date: 05/16/2015    History of Present Illness patient is a 50 yo male with nonischemic cardiomyopathy since 2002 with acute on chronic systolic heart failure and cardiogenic shock, deteriorating on 2 inotropes and had IABP placed. Moderate RV systolic dysfunction on preop echo.  s/p HeartMate II LVAD    PT Comments    Patient progressing well towards PT goals. Pt with decreased mood yesterday and eager to go outside. Session focused on ambulation and exercise/endurance. Pt ambulated down to atrium. Long seated rest break due to fatigue. HR stable 112-128 bpm.  Continued LVAD education. Patient able to perform conversion to/from battery support with setup and verbal cues. Patient appears more comfortable with equipment and is receptive to all information/cues. Will continue to follow per current [place].     Follow Up Recommendations  Home health PT;Supervision/Assistance - 24 hour (pending progress, may not need HHPT)     Equipment Recommendations  Other (comment) (rollator)    Recommendations for Other Services       Precautions / Restrictions Precautions Precautions: Sternal;Fall Precaution Comments: sternal,LVAD precautions- black bag at all times Restrictions Weight Bearing Restrictions: No    Mobility  Bed Mobility Overal bed mobility: Needs Assistance Bed Mobility: Sit to Sidelying;Rolling Rolling: Min guard     Sit to supine: Mod assist;+2 for physical assistance;HOB elevated   General bed mobility comments: pt in chair  Transfers Overall transfer level: Needs assistance Equipment used: None Transfers: Sit to/from Stand Sit to Stand: Min assist;Mod assist         General transfer comment: stood from chair holding heart pillow with min assist for boost up; Mod A from low bench outside (secondary to weakness)  Ambulation/Gait Ambulation/Gait assistance:  Min guard Ambulation Distance (Feet): 400 Feet (+ 150') Assistive device:  (pushing w/c.) Gait Pattern/deviations: Step-through pattern;Decreased stride length;Trunk flexed Gait velocity: decreased Gait velocity interpretation: Below normal speed for age/gender General Gait Details: Slow, guarded gait with cues for upright posture. HR 110-128s bpm.  1 long seated rest break. Walked outside.   Stairs            Wheelchair Mobility    Modified Rankin (Stroke Patients Only)       Balance Overall balance assessment: Needs assistance Sitting-balance support: Feet supported;No upper extremity supported Sitting balance-Leahy Scale: Good     Standing balance support: During functional activity Standing balance-Leahy Scale: Fair                      Cognition Arousal/Alertness: Awake/alert Behavior During Therapy: WFL for tasks assessed/performed Overall Cognitive Status: Within Functional Limits for tasks assessed                      Exercises      General Comments General comments (skin integrity, edema, etc.): Continued LVAD education. Pt able to verbalize and demonstrate how to change from battery to/from monitor x2 and contents needed for black bag.       Pertinent Vitals/Pain Pain Assessment: No/denies pain Pain Score: 0-No pain    Home Living                      Prior Function            PT Goals (current goals can now be found in the care plan section) Progress towards PT goals: Progressing toward goals    Frequency  Min 4X/week    PT Plan Current plan remains appropriate    Co-evaluation             End of Session   Activity Tolerance: Patient tolerated treatment well;Patient limited by fatigue Patient left: in bed;with call bell/phone within reach;with nursing/sitter in room     Time: 1107-1206 PT Time Calculation (min) (ACUTE ONLY): 59 min  Charges:  $Gait Training: 23-37 mins $Therapeutic Activity: 8-22  mins $Self Care/Home Management: 8-22                    G Codes:      [place] 05/16/2015, 1:17 PM  [name], [place], DPT [phone]

## 2015-05-16 NOTE — Progress Notes (Signed)
HeartMate 2 Rounding Note     Postop day #6 HeartMate 2 LVAD implantation   Subjective:   History of nonischemic cardiomyopathy, ejection fraction 15% Preoperative cardiogenic shock on balloon pump and dual inotropes Preoperative protein deficiency malnutrition with prealbumin 8.4 Preoperative moderate RV dysfunction Preoperative hepatic insufficiency with bilirubin of 4.2  Status post AICD implantation 2002  The patient has continued to progress after surgery. He was extubated postop day 1. He is ambulating in the hallway. His VAD parameters have remained satisfactory at 9000 rpm. He has weaned off nitric oxide as well as epinephrine with stable RV performance. He is maintaining a sinus rhythm.His milrinone was weaned off with drop in CO-Ox and increase in CVP and is now on at 0.125  Several PI events last 24 hrs  Chest tube drainage remains persistent but serous and the  mediastinal and pocket drains remain.CXR clear He is diuresing fairly well  And close to baseline Pain is well-controlled and neuro status is stable His appetite is poor with some nausea- amiodarone side effect? Remains nsr. remians protein deficient Bilirubin continues to improve and is now 6     LVAD INTERROGATION:  HeartMate II LVAD:  Flow 4.9 liters/min, speed 9000, power 5.2 PI 6.4  Controller Serial intact   Objective:    Vital Signs:   Temp:  [97.8 F (36.6 C)-98.1 F (36.7 C)] 98.1 F (36.7 C) (02/06 0842) Pulse Rate:  [106-122] 122 (02/06 0900) Resp:  [14-27] 18 (02/06 0900) SpO2:  [92 %-96 %] 95 % (02/06 0900) Weight:  [172 lb 6.4 oz (78.2 kg)] 172 lb 6.4 oz (78.2 kg) (02/06 0600) Last BM Date: 05/15/15 Mean arterial Pressure  75-85  Intake/Output:   Intake/Output Summary (Last 24 hours) at 05/16/15 1135 Last data filed at 05/16/15 0700  Gross per 24 hour  Intake 1137.17 ml  Output   1834 ml  Net -696.83 ml     Physical Exam: General:  Well appearing. No resp difficulty, jaundiced  skin improving HEENT: normal Neck: supple. JVP . Carotids 2+ bilat; no bruits. No lymphadenopathy or thryomegaly appreciated. Cor: Mechanical heart sounds with LVAD hum present. Lungs: clear Abdomen: soft, nontender, nondistended. No hepatosplenomegaly. No bruits or masses. Good bowel sounds. Extremities: no cyanosis, clubbing, rash, edema Neuro: alert & orientedx3, cranial nerves grossly intact. moves all 4 extremities w/o difficulty. Affect pleasant  Telemetry: sinus tach  Labs: Basic Metabolic Panel:  Recent Labs Lab 05/11/15 0320  05/12/15 0340  05/13/15 0430 05/13/15 1635 05/14/15 0415 05/15/15 0405 05/16/15 0501  NA 136  < > 133*  < > 133* 127* 128* 127* 127*  K 4.5  < > 4.4  < > 4.0 4.3 4.2 3.8 3.3*  CL 102  < > 100*  < > 103 97* 93* 93* 92*  CO2 24  --  24  --  22  --  24 25 24   GLUCOSE 85  < > 99  < > 79 122* 103* 91 78  BUN 13  < > 11  < > 13 17 15 16 12   CREATININE 0.77  < > 0.82  < > 0.82 0.80 0.94 0.82 0.76  CALCIUM 7.9*  --  8.5*  --  7.9*  --  8.9 8.6* 8.0*  MG 2.3  < > 1.9  --  1.6*  --  1.7 1.7 1.6*  PHOS 2.6  --  3.5  --  2.8  --   --   --   --   < > =  values in this interval not displayed.  Liver Function Tests:  Recent Labs Lab 05/12/15 0340 05/13/15 0430 05/14/15 0415 05/15/15 0405 05/16/15 0501  AST 92* 64* 68* 66* 57*  ALT 24 25 29 30 26   ALKPHOS 38 48 74 89 91  BILITOT 12.9* 9.8* 8.2* 7.4* 6.2*  PROT 4.9* 4.9* 5.4* 5.6* 5.3*  ALBUMIN 2.9* 2.5* 2.6* 3.0* 2.6*   No results for input(s): LIPASE, AMYLASE in the last 168 hours. No results for input(s): AMMONIA in the last 168 hours.  CBC:  Recent Labs Lab 05/12/15 0340  05/13/15 0430 05/13/15 1635 05/14/15 0415 05/15/15 0405 05/16/15 0501  WBC 8.9  --  8.3  --  9.0 7.5 9.6  NEUTROABS 6.5  --  6.2  --  6.4 4.9 6.5  HGB 8.6*  < > 8.2* 9.5* 8.9* 8.8* 9.0*  HCT 24.4*  < > 23.9* 28.0* 25.7* 25.3* 25.5*  MCV 92.4  --  94.5  --  94.5 93.7 93.4  PLT 89*  --  100*  --  136* 126* 178  < >  = values in this interval not displayed.  INR:  Recent Labs Lab 05/12/15 0340 05/13/15 0430 05/14/15 0415 05/15/15 0405 05/16/15 0501  INR 1.53* 1.33 1.37 2.54* 3.22*    Other results:  EKG:   Imaging: Dg Chest Port 1 View  05/16/2015  CLINICAL DATA:  Left ventricular assist device placement EXAM: PORTABLE CHEST 1 VIEW COMPARISON:  May 15, 2015 FINDINGS: There is a left ventricular assist device present. Pacemaker is attached to the right ventricle. Central catheter tip is in the superior vena cava. There has been interval removal of left chest tube without apparent pneumothorax. There is atelectatic change in the left mid lung bibasilar regions, stable. No new opacity. Heart is enlarged, stable. No adenopathy apparent. IMPRESSION: Left chest tube removed. No pneumothorax. Atelectasis in the left mid lung and basilar regions, stable. No new opacity. No change in cardiac silhouette. Electronically Signed   By: Lowella Grip III M.D.   On: 05/16/2015 07:48   Dg Chest Port 1 View  05/15/2015  CLINICAL DATA:  LVAD present. Gx of CHF. Pacemaker insertion in 2009. Pt is a former smoker. EXAM: PORTABLE CHEST 1 VIEW COMPARISON:  05/14/2015 FINDINGS: RIGHT-sided pacemaker and LVAD device noted. LEFT chest tube in place with of pneumothorax. Stable cardiomegaly. No pulmonary edema. LEFT basilar atelectasis. IMPRESSION: 1. No interval change.  Cardiomegaly and basilar atelectasis. 2. No pulmonary edema. 3. LEFT chest tube in place without pneumothorax. Electronically Signed   By: Suzy Bouchard M.D.   On: 05/15/2015 07:38     Medications:     Scheduled Medications: . amiodarone  200 mg Oral BID  . antiseptic oral rinse  7 mL Mouth Rinse BID  . aspirin EC  81 mg Oral Daily  . bisacodyl  10 mg Oral Daily   Or  . bisacodyl  10 mg Rectal Daily  . chlorpheniramine-HYDROcodone  5 mL Oral QHS  . citalopram  20 mg Oral Daily  . digoxin  0.125 mg Oral Daily  . docusate sodium  200 mg Oral  Daily  . feeding supplement  1 Container Oral Q1400  . feeding supplement (ENSURE ENLIVE)  237 mL Oral Daily  . ferrous Q000111Q C-folic acid  1 capsule Oral TID PC  . guaiFENesin  600 mg Oral BID  . megestrol  20 mg Oral Daily  . pantoprazole  40 mg Oral Daily  . potassium chloride  40 mEq Oral  Once  . sodium chloride flush  10 mL Intravenous Q12H  . warfarin  1 mg Oral q1800  . Warfarin - Physician Dosing Inpatient   Does not apply q1800    Infusions: . sodium chloride 50 mL/hr at 05/16/15 0700  . milrinone 0.125 mcg/kg/min (05/16/15 0749)    PRN Medications: hydrALAZINE, ondansetron (ZOFRAN) IV, oxyCODONE, sodium chloride flush, traMADol, zolpidem   Assessment:  Left subclavian triple-lumen catheter placed 2-2 after attempt at PICC line was unsuccessful Serous drainage from MT and pocket drains with stable Hb Advancing diet Coumadin  ordered-- INR 3.2   Plan/Discussion:   Continue diuresis with lasix Leave drains for output > 400 ml in 24 hr Advance diet, maximize protein intake , try megace to improve appetite  Follow bilirubin Continue with physical therapy Transfer to stepdown. Will need echo to assess proper speed Continue with VAD education     I reviewed the LVAD parameters from today, and compared the results to the patient's prior recorded data.  No programming changes were made.  The LVAD is functioning within specified parameters.  The patient performs LVAD self-test daily.  LVAD interrogation was negative for any significant power changes, alarms or PI events/speed drops.  LVAD equipment check completed and is in good working order.  Back-up equipment present.   LVAD education done on emergency procedures and precautions and reviewed exit site care.  Length of Stay: [place] III 05/16/2015, 11:35 AM

## 2015-05-16 NOTE — Progress Notes (Signed)
Pt MAP 70, CVP 7. Dr Prescott Gum notified of decrease in MAP, Orders received. Will monitor.

## 2015-05-16 NOTE — Progress Notes (Signed)
Occupational Therapy Treatment Patient Details Name: Patrick Humphrey MRN: [id number] DOB: 04-04-1966 Today's Date: 05/16/2015    History of present illness patient is a 49 yo male with nonischemic cardiomyopathy since 2002 with acute on chronic systolic heart failure and cardiogenic shock, deteriorating on 2 inotropes and had IABP placed. Moderate RV systolic dysfunction on preop echo.  s/p HeartMate II LVAD   OT comments  Pt in good spirits having spent some time in the sunshine this morning with PT. Pt further encouraged by plan for him to move to floor.  Focus of session on seated grooming, LB dressing and education in energy conservation strategies.   Follow Up Recommendations  Home health OT;Supervision/Assistance - 24 hour (may not require by d/c)    Equipment Recommendations  None recommended by OT    Recommendations for Other Services      Precautions / Restrictions Precautions Precautions: Sternal;Fall Precaution Comments: sternal,LVAD precautions- black bag at all times Restrictions Weight Bearing Restrictions: No       Mobility Bed Mobility Overal bed mobility: Needs Assistance Bed Mobility: Rolling;Sidelying to Sit;Sit to Sidelying Rolling: Min guard Sidelying to sit: Min guard    Sit to sidelying: Mod assist General bed mobility comments: Cues for technique to adhere to sternal precautions.  Transfers     Balance Overall balance assessment: Needs assistance Sitting-balance support: Feet supported;No upper extremity supported Sitting balance-Leahy Scale: Good     Standing balance support: During functional activity Standing balance-Leahy Scale: Fair                     ADL Overall ADL's : Needs assistance/impaired     Grooming: Oral care;Sitting;Set up               Lower Body Dressing: Minimal assistance;Sit to/from stand Lower Body Dressing Details (indicate cue type and reason): able to cross foot over opposite knee               General ADL Comments: Educated pt in energy conservation strategies and provided handout.      Vision                     Perception     Praxis      Cognition   Behavior During Therapy: WFL for tasks assessed/performed Overall Cognitive Status: Within Functional Limits for tasks assessed                       Extremity/Trunk Assessment               Exercises     Shoulder Instructions       General Comments      Pertinent Vitals/ Pain       Pain Assessment: Faces Pain Score: 0-No pain Faces Pain Scale: Hurts little more Pain Location: chest tube site with coughing Pain Descriptors / Indicators: Grimacing;Guarding Pain Intervention(s): Monitored during session (used heart pillow to splint)  Home Living                                          Prior Functioning/Environment              Frequency Min 2X/week     Progress Toward Goals  OT Goals(current goals can now be found in the care plan section)  Progress towards OT goals: Progressing toward goals  Acute Rehab OT Goals Patient Stated Goal: to go home  Plan Discharge plan remains appropriate    Co-evaluation                 End of Session     Activity Tolerance Patient tolerated treatment well   Patient Left in bed;with call bell/phone within reach   Nurse Communication          Time: 1350-1416 OT Time Calculation (min): 26 min  Charges: OT General Charges $OT Visit: 1 Procedure OT Treatments $Self Care/Home Management : 8-22 mins  Malka So 05/16/2015, 2:21 PM  [phone]

## 2015-05-16 NOTE — Progress Notes (Signed)
Came to see patient--not in room. Walking around off the floor with RN. Will come back later.

## 2015-05-16 NOTE — Progress Notes (Signed)
Patient ID: Patrick Humphrey, male   DOB: 14-Oct-1965, 50 y.o.   MRN: [id number] HeartMate 2 Rounding Note  Subjective:    LVAD placement 05/10/15.   Extubated 2/1.  Got 2 units PRBCs 1/31. Hemoglobin 8.6>8.2>8.9>9.  1 chest tube in.    MAP 70 last night.  He also had a lot of PI events yesterday.  Got NS @ 50 cc/hr + albumen, only 1 PI event since albumen and NS given.  Now MAP 80s.   Milrinone restarted yesterday at 0.25 with co-ox 54%.  Today, co-ox 61% with CVP 14.   Pain is getting better.   He is jaundiced, total bilirubin increased.  Pre-op 4.2 => post-op 9.8 => 8.6 => 7.4 => 6.2 (elevated direct bilirubin as well).   He appears to have gone out of atrial flutter and back into sinus tachycardia on amiodarone, HR 110s today.   LVAD INTERROGATION:  HeartMate II LVAD:  Flow 6.0 liters/min, speed 9000,  power 5.6, PI 6.5.  20 PI events, ending around 1 am with albumen and NS infusion.   Objective:    Vital Signs:   Temp:  [97.7 F (36.5 C)-97.9 F (36.6 C)] 97.8 F (36.6 C) (02/06 0422) Pulse Rate:  [98-118] 112 (02/06 0700) Resp:  [12-27] 18 (02/06 0700) SpO2:  [92 %-99 %] 93 % (02/06 0700) Weight:  [172 lb 6.4 oz (78.2 kg)] 172 lb 6.4 oz (78.2 kg) (02/06 0600) Last BM Date: 05/15/15 Mean arterial Pressure 80s  Intake/Output:   Intake/Output Summary (Last 24 hours) at 05/16/15 0736 Last data filed at 05/16/15 0700  Gross per 24 hour  Intake 1280.46 ml  Output   2144 ml  Net -863.54 ml     Physical Exam: CVP 14 General:  Jaundice. No resp difficulty. Sitting in the chair.  HEENT: normal Neck: supple. JVP not elevated. Carotids 2+ bilat; no bruits. No lymphadenopathy or thryomegaly appreciated. L subclavian TLC Cor: Mechanical heart sounds with LVAD hum present. Lungs: clear Abdomen: soft, nontender, nondistended. No hepatosplenomegaly. No bruits or masses. Good bowel sounds. Driveline: C/D/I; securement device intact and driveline incorporated Extremities: no  cyanosis, clubbing, rash, edema Neuro: alert & orientedx3, cranial nerves grossly intact. moves all 4 extremities w/o difficulty. Affect pleasant  Telemetry: sinus tachy around 110  Labs: Basic Metabolic Panel:  Recent Labs Lab 05/11/15 0320  05/11/15 1610 05/12/15 0340 05/12/15 1612 05/13/15 0430 05/13/15 1635 05/14/15 0415 05/15/15 0405  NA 136  < >  --  133* 132* 133* 127* 128* 127*  K 4.5  < >  --  4.4 4.5 4.0 4.3 4.2 3.8  CL 102  < >  --  100* 95* 103 97* 93* 93*  CO2 24  --   --  24  --  22  --  24 25  GLUCOSE 85  < >  --  99 116* 79 122* 103* 91  BUN 13  < >  --  11 14 13 17 15 16   CREATININE 0.77  < > 0.80 0.82 0.80 0.82 0.80 0.94 0.82  CALCIUM 7.9*  --   --  8.5*  --  7.9*  --  8.9 8.6*  MG 2.3  --  2.0 1.9  --  1.6*  --  1.7 1.7  PHOS 2.6  --   --  3.5  --  2.8  --   --   --   < > = values in this interval not displayed.  Liver Function Tests:  Recent Labs  Lab 05/11/15 0320 05/12/15 0340 05/13/15 0430 05/14/15 0415 05/15/15 0405  AST 84* 92* 64* 68* 66*  ALT 24 24 25 29 30   ALKPHOS 37* 38 48 74 89  BILITOT 9.2* 12.9* 9.8* 8.2* 7.4*  PROT 4.8* 4.9* 4.9* 5.4* 5.6*  ALBUMIN 3.1* 2.9* 2.5* 2.6* 3.0*   No results for input(s): LIPASE, AMYLASE in the last 168 hours. No results for input(s): AMMONIA in the last 168 hours.  CBC:  Recent Labs Lab 05/12/15 0340  05/13/15 0430 05/13/15 1635 05/14/15 0415 05/15/15 0405 05/16/15 0501  WBC 8.9  --  8.3  --  9.0 7.5 9.6  NEUTROABS 6.5  --  6.2  --  6.4 4.9 6.5  HGB 8.6*  < > 8.2* 9.5* 8.9* 8.8* 9.0*  HCT 24.4*  < > 23.9* 28.0* 25.7* 25.3* 25.5*  MCV 92.4  --  94.5  --  94.5 93.7 93.4  PLT 89*  --  100*  --  136* 126* 178  < > = values in this interval not displayed.  INR:  Recent Labs Lab 05/12/15 0340 05/13/15 0430 05/14/15 0415 05/15/15 0405 05/16/15 0501  INR 1.53* 1.33 1.37 2.54* 3.22*    Other results:  EKG:   Imaging: Dg Chest Port 1 View  05/15/2015  CLINICAL DATA:  LVAD present.  Gx of CHF. Pacemaker insertion in 2009. Pt is a former smoker. EXAM: PORTABLE CHEST 1 VIEW COMPARISON:  05/14/2015 FINDINGS: RIGHT-sided pacemaker and LVAD device noted. LEFT chest tube in place with of pneumothorax. Stable cardiomegaly. No pulmonary edema. LEFT basilar atelectasis. IMPRESSION: 1. No interval change.  Cardiomegaly and basilar atelectasis. 2. No pulmonary edema. 3. LEFT chest tube in place without pneumothorax. Electronically Signed   By: Suzy Bouchard M.D.   On: 05/15/2015 07:38     Medications:     Scheduled Medications: . antiseptic oral rinse  7 mL Mouth Rinse BID  . aspirin EC  81 mg Oral Daily  . bisacodyl  10 mg Oral Daily   Or  . bisacodyl  10 mg Rectal Daily  . chlorpheniramine-HYDROcodone  5 mL Oral QHS  . citalopram  20 mg Oral Daily  . digoxin  0.125 mg Oral Daily  . docusate sodium  200 mg Oral Daily  . feeding supplement  1 Container Oral Q1400  . feeding supplement (ENSURE ENLIVE)  237 mL Oral Daily  . ferrous Q000111Q C-folic acid  1 capsule Oral TID PC  . furosemide  40 mg Intravenous Daily  . guaiFENesin  600 mg Oral BID  . metoCLOPramide  10 mg Oral TID AC  . pantoprazole  40 mg Oral Daily  . sodium chloride flush  10 mL Intravenous Q12H  . sorbitol  60 mL Oral q morning - 10a  . warfarin  2.5 mg Oral q1800  . Warfarin - Physician Dosing Inpatient   Does not apply q1800    Infusions: . sodium chloride 50 mL/hr at 05/16/15 0700  . milrinone 0.25 mcg/kg/min (05/16/15 0700)    PRN Medications: hydrALAZINE, ondansetron (ZOFRAN) IV, oxyCODONE, sodium chloride flush, temazepam, traMADol   Assessment:   1. Nonischemic cardiomyopathy: Long-standing, EF 20-25%.  Coronary angiography this admission with no significant CAD. Cardiogenic shock requiring milrinone and norepinephrine, IABP placed. Heartmate II LVAD placed 05/10/15.  2. Post-op blood loss anemia: 2 units PRBCs 1/31.  3. Elevate bilirubin: Noted pre-op, thought due to RV  failure.  Higher post-op with jaundice.  Elevated direct bilirubin, so liver source rather than hemolysis.  4.  Atrial flutter: Resolved on amiodarone.  5. Hyponatremia   Plan/Discussion:    Today's co-ox is 61%.  CVP 14.  Milrinone restarted at 0.25 with marginal co-ox yesterday.  - Decrease milrinone to 0.125, slow wean from there.  - Can continue current IV Lasix once daily with CVP back up to 14.  Got albumin and IV fluid last night with MAP 50 and multiple PIs.  PI events ended after 1 am with volume infusion.   - Stop lisinopril for now with low MAP last night.  May need to restart, will see what trend looks like.   INR today 3.2, hemoglobin stable.  Decreased ASA to 81.     1 chest tube in place.  Hallucinated with Restoril last night.  Has had Ambien before, would try this to help sleep.   Pain is controlled.   Elevated total bilirubin.  Some elevation pre-op thought due to CHF, more marked post-op.  Trending down today but remains jaundiced.  Normal alkaline phosphatase.  LDH stable and not significantly elevated, so think significant hemolysis is not likely.  Elevated direct bilirubin suggests liver source.   Back in NSR on amiodarone.  Change to po amiodarone 200 bid today.   Remains hyponatremic today, continue 1800 cc po fluid restriction.   I reviewed the LVAD parameters from today, and compared the results to the patient's prior recorded data.  No programming changes were made.  The LVAD is functioning within specified parameters.  The patient performs LVAD self-test daily.  LVAD interrogation was negative for any significant power changes, alarms or PI events/speed drops.  LVAD equipment check completed and is in good working order.  Back-up equipment present.   LVAD education done on emergency procedures and precautions and reviewed exit site care.  35 minutes critical care time.   Length of Stay: [place]  05/16/2015, 7:36 AM  VAD Team --- VAD ISSUES  ONLY--- Pager [phone] (7am - 7am)  Advanced Heart Failure Team  Pager [phone] (M-F; 7a - 4p)  Please contact [facility] for night-coverage after hours (4p -7a ) and weekends on [email or web address]

## 2015-05-17 ENCOUNTER — Inpatient Hospital Stay (HOSPITAL_COMMUNITY): Payer: Medicare Other

## 2015-05-17 ENCOUNTER — Encounter: Payer: Self-pay | Admitting: Cardiology

## 2015-05-17 DIAGNOSIS — I4892 Unspecified atrial flutter: Secondary | ICD-10-CM

## 2015-05-17 LAB — PROTIME-INR
INR: 3.18 — ABNORMAL HIGH (ref 0.00–1.49)
Prothrombin Time: 32 seconds — ABNORMAL HIGH (ref 11.6–15.2)

## 2015-05-17 LAB — CBC WITH DIFFERENTIAL/PLATELET
Basophils Absolute: 0.1 10*3/uL (ref 0.0–0.1)
Basophils Relative: 1 %
Eosinophils Absolute: 0.2 10*3/uL (ref 0.0–0.7)
Eosinophils Relative: 2 %
HCT: 26.9 % — ABNORMAL LOW (ref 39.0–52.0)
Hemoglobin: 9.4 g/dL — ABNORMAL LOW (ref 13.0–17.0)
Lymphocytes Relative: 12 %
Lymphs Abs: 1.5 10*3/uL (ref 0.7–4.0)
MCH: 32.8 pg (ref 26.0–34.0)
MCHC: 34.9 g/dL (ref 30.0–36.0)
MCV: 93.7 fL (ref 78.0–100.0)
Monocytes Absolute: 1.8 10*3/uL — ABNORMAL HIGH (ref 0.1–1.0)
Monocytes Relative: 15 %
Neutro Abs: 8.6 10*3/uL — ABNORMAL HIGH (ref 1.7–7.7)
Neutrophils Relative %: 70 %
Platelets: 291 10*3/uL (ref 150–400)
RBC: 2.87 MIL/uL — ABNORMAL LOW (ref 4.22–5.81)
RDW: 17.5 % — ABNORMAL HIGH (ref 11.5–15.5)
WBC: 12.2 10*3/uL — ABNORMAL HIGH (ref 4.0–10.5)

## 2015-05-17 LAB — CARBOXYHEMOGLOBIN
Carboxyhemoglobin: 2 % — ABNORMAL HIGH (ref 0.5–1.5)
Methemoglobin: 0.8 % (ref 0.0–1.5)
O2 Saturation: 54.1 %
Total hemoglobin: 11.1 g/dL — ABNORMAL LOW (ref 13.5–18.0)

## 2015-05-17 LAB — COMPREHENSIVE METABOLIC PANEL
ALT: 30 U/L (ref 17–63)
AST: 65 U/L — ABNORMAL HIGH (ref 15–41)
Albumin: 2.8 g/dL — ABNORMAL LOW (ref 3.5–5.0)
Alkaline Phosphatase: 106 U/L (ref 38–126)
Anion gap: 11 (ref 5–15)
BUN: 12 mg/dL (ref 6–20)
CO2: 24 mmol/L (ref 22–32)
Calcium: 8.2 mg/dL — ABNORMAL LOW (ref 8.9–10.3)
Chloride: 91 mmol/L — ABNORMAL LOW (ref 101–111)
Creatinine, Ser: 0.77 mg/dL (ref 0.61–1.24)
GFR calc Af Amer: >60 mL/min (ref >60)
GFR calc non Af Amer: >60 mL/min (ref >60)
Glucose, Bld: 95 mg/dL (ref 65–99)
Potassium: 3.3 mmol/L — ABNORMAL LOW (ref 3.5–5.1)
Sodium: 126 mmol/L — ABNORMAL LOW (ref 135–145)
Total Bilirubin: 6.3 mg/dL — ABNORMAL HIGH (ref 0.3–1.2)
Total Protein: 6 g/dL — ABNORMAL LOW (ref 6.5–8.1)

## 2015-05-17 LAB — MAGNESIUM: Magnesium: 1.9 mg/dL (ref 1.7–2.4)

## 2015-05-17 LAB — DIGOXIN LEVEL: Digoxin Level: 0.4 ng/mL — ABNORMAL LOW (ref 0.8–2.0)

## 2015-05-17 LAB — LACTATE DEHYDROGENASE: LDH: 253 U/L — ABNORMAL HIGH (ref 98–192)

## 2015-05-17 MED ORDER — POTASSIUM CHLORIDE CRYS ER 20 MEQ PO TBCR
40.0000 meq | EXTENDED_RELEASE_TABLET | Freq: Three times a day (TID) | ORAL | Status: DC
  Administered 2015-05-17 – 2015-05-18 (×5): 40 meq via ORAL
  Filled 2015-05-17 (×4): qty 2

## 2015-05-17 MED ORDER — MAGNESIUM SULFATE 2 GM/50ML IV SOLN
2.0000 g | INTRAVENOUS | Status: AC
  Administered 2015-05-17: 2 g via INTRAVENOUS
  Filled 2015-05-17: qty 50

## 2015-05-17 MED ORDER — WARFARIN SODIUM 1 MG PO TABS: 1.0000 mg | ORAL_TABLET | Freq: Once | ORAL | Status: DC

## 2015-05-17 MED ORDER — AMIODARONE IV BOLUS ONLY 150 MG/100ML
150.0000 mg | Freq: Once | INTRAVENOUS | Status: DC
  Filled 2015-05-17: qty 100

## 2015-05-17 MED ORDER — DEXTROSE 5 % IV SOLN
300.0000 mg | Freq: Once | INTRAVENOUS | Status: DC
  Filled 2015-05-17: qty 6

## 2015-05-17 MED ORDER — AMIODARONE HCL IN DEXTROSE 360-4.14 MG/200ML-% IV SOLN
30.0000 mg/h | INTRAVENOUS | Status: DC
  Administered 2015-05-17: 30 mg/h via INTRAVENOUS
  Filled 2015-05-17 (×2): qty 200

## 2015-05-17 MED ORDER — VANCOMYCIN HCL IN DEXTROSE 1-5 GM/200ML-% IV SOLN
1000.0000 mg | Freq: Two times a day (BID) | INTRAVENOUS | Status: AC
  Administered 2015-05-17 – 2015-05-28 (×24): 1000 mg via INTRAVENOUS
  Filled 2015-05-17 (×28): qty 200

## 2015-05-17 MED ORDER — MILRINONE IN DEXTROSE 20 MG/100ML IV SOLN
0.1250 ug/kg/min | INTRAVENOUS | Status: DC
  Administered 2015-05-17: 0.25 ug/kg/min via INTRAVENOUS
  Administered 2015-05-18 – 2015-05-22 (×4): 0.125 ug/kg/min via INTRAVENOUS
  Filled 2015-05-17 (×4): qty 100

## 2015-05-17 MED ORDER — WARFARIN SODIUM 1 MG PO TABS
1.0000 mg | ORAL_TABLET | Freq: Every day | ORAL | Status: DC
  Administered 2015-05-17: 1 mg via ORAL
  Filled 2015-05-17 (×2): qty 1

## 2015-05-17 MED ORDER — FUROSEMIDE 10 MG/ML IJ SOLN
40.0000 mg | Freq: Two times a day (BID) | INTRAMUSCULAR | Status: DC
  Administered 2015-05-17: 40 mg via INTRAVENOUS
  Filled 2015-05-17: qty 4

## 2015-05-17 MED ORDER — AMIODARONE HCL IN DEXTROSE 360-4.14 MG/200ML-% IV SOLN
60.0000 mg/h | INTRAVENOUS | Status: DC
  Administered 2015-05-17 – 2015-05-18 (×3): 60 mg/h via INTRAVENOUS
  Filled 2015-05-17 (×5): qty 200

## 2015-05-17 MED ORDER — POTASSIUM CHLORIDE CRYS ER 10 MEQ PO TBCR
EXTENDED_RELEASE_TABLET | ORAL | Status: AC
  Filled 2015-05-17: qty 4

## 2015-05-17 MED ORDER — SODIUM CHLORIDE 0.9% FLUSH: 10.0000 mL | INTRAVENOUS | Status: DC | PRN

## 2015-05-17 MED ORDER — PANTOPRAZOLE SODIUM 40 MG PO TBEC
40.0000 mg | DELAYED_RELEASE_TABLET | Freq: Every day | ORAL | Status: DC
  Administered 2015-05-17 – 2015-05-30 (×14): 40 mg via ORAL
  Filled 2015-05-17 (×14): qty 1

## 2015-05-17 MED ORDER — AMIODARONE LOAD VIA INFUSION
150.0000 mg | Freq: Once | INTRAVENOUS | Status: AC
  Administered 2015-05-17: 150 mg via INTRAVENOUS
  Filled 2015-05-17: qty 83.34

## 2015-05-17 MED ORDER — AMIODARONE IV BOLUS ONLY 150 MG/100ML
150.0000 mg | Freq: Once | INTRAVENOUS | Status: AC
  Administered 2015-05-17: 150 mg via INTRAVENOUS
  Filled 2015-05-17: qty 100

## 2015-05-17 MED ORDER — WARFARIN - PHARMACIST DOSING INPATIENT
Freq: Every day | Status: DC
  Administered 2015-05-17 – 2015-05-24 (×3)

## 2015-05-17 MED ORDER — SILDENAFIL CITRATE 20 MG PO TABS
20.0000 mg | ORAL_TABLET | Freq: Three times a day (TID) | ORAL | Status: AC
  Administered 2015-05-17 – 2015-05-23 (×19): 20 mg via ORAL
  Filled 2015-05-17 (×21): qty 1

## 2015-05-17 MED ORDER — AMIODARONE HCL IN DEXTROSE 360-4.14 MG/200ML-% IV SOLN
60.0000 mg/h | INTRAVENOUS | Status: DC
  Administered 2015-05-17 (×2): 60 mg/h via INTRAVENOUS
  Filled 2015-05-17: qty 200

## 2015-05-17 NOTE — Care Management Important Message (Signed)
Important Message  Patient Details  Name: YAVIAN BEDOYA MRN: [id number] Date of Birth: 1965-10-30   Medicare Important Message Given:  Yes    Louanne Belton 05/17/2015, 10:53 AMImportant Message  Patient Details  Name: KAHLO KLECHA MRN: [id number] Date of Birth: 31-Aug-1965   Medicare Important Message Given:  Yes    Keamber Macfadden G 05/17/2015, 10:52 AM

## 2015-05-17 NOTE — Progress Notes (Signed)
RN contacted by Colletta [place] from [place] team, MD ordered Amiodarone load and infusion, orders will be initiated, pt still asymptomatic, pt call bell within reach and bed in lowest position, RN will continue to monitor   Izola Price, RN 05/17/2015

## 2015-05-17 NOTE — Progress Notes (Signed)
  Echocardiogram 2D Echocardiogram has been performed.  Johny Chess 05/17/2015, 5:06 PM

## 2015-05-17 NOTE — Progress Notes (Signed)
Pt asymptomatic and resting, pt heart rate sustaining in the 140-150s, on-call MD notified and recommended to perform EKG, no new orders at this time, EKG completed, Stephanie from [place] team notified, no new orders given at this time, RN will continue to monitor, pt call bell within reach and bed in lowest position.    Izola Price, RN 05/17/2015

## 2015-05-17 NOTE — Progress Notes (Addendum)
PT Cancellation Note  Patient Details Name: Patrick Humphrey MRN: [id number] DOB: 02/05/1966   Cancelled Treatment:    Reason Eval/Treat Not Completed: Medical issues which prohibited therapy holding PT this AM per request of RN as pt back in A-fib this morning with elevated HR and exhausted. Will follow up this PM as time allows.  Attempted to see pt in PM however HR still elevated in 138-140 bpm at rest.  [place] 05/17/2015, 10:08 AM [name], PT, DPT [phone]

## 2015-05-17 NOTE — Progress Notes (Signed)
Utilization review completed.  

## 2015-05-17 NOTE — Progress Notes (Signed)
Patient ID: Patrick Humphrey, male   DOB: April 20, 1965, 50 y.o.   MRN: [id number] HeartMate 2 Rounding Note  Subjective:    LVAD placement 05/10/15.   Extubated 2/1.  Got 2 units PRBCs 1/31. Hemoglobin 8.6>8.2>8.9>9>9.4   2 chest tubes in.    He is jaundiced, total bilirubin increased.  Pre-op 4.2 => post-op 9.8 => 8.6 => 7.4 => 6.2 => 6.3  (elevated direct bilirubin as well).   Yesterday, milrinone was decreased to 0.125 mcg/kg/min. Todays CO-OX 54%. Earlier this morning back in A fib RVR to 140s. Asymptomatic. Weight is up.   Complaining of cough over night. Denies pain.    LVAD INTERROGATION:  HeartMate II LVAD:  Flow 4.2 liters/min, speed 9000,  power 4.8 , PI 6.0.  8  PI events   Objective:    Vital Signs:   Temp:  [98.1 F (36.7 C)-98.6 F (37 C)] 98.6 F (37 C) (02/07 0440) Pulse Rate:  [105-122] 117 (02/07 0440) Resp:  [18-30] 20 (02/07 0440) SpO2:  [95 %-98 %] 96 % (02/07 0440) Weight:  [181 lb (82.1 kg)] 181 lb (82.1 kg) (02/07 0057) Last BM Date: 05/16/15 Mean arterial Pressure 84  Intake/Output:   Intake/Output Summary (Last 24 hours) at 05/17/15 0728 Last data filed at 05/16/15 1955  Gross per 24 hour  Intake  96.37 ml  Output    800 ml  Net -703.63 ml     Physical Exam: General:  Jaundice. No resp difficulty. In bed.   HEENT: normal Neck: supple. JVP ~10. Carotids 2+ bilat; no bruits. No lymphadenopathy or thryomegaly appreciated. L subclavian TLC Cor: Mechanical heart sounds with LVAD hum present. Lungs: Decreased in the bases.  Abdomen: soft, nontender, nondistended. No hepatosplenomegaly. No bruits or masses. Good bowel sounds. Driveline: C/D/I; securement device intact and driveline incorporated Extremities: no cyanosis, clubbing, rash, R and LLE 2+ edema. Red rash R and LLE 1/2 way up.  Neuro: alert & orientedx3, cranial nerves grossly intact. moves all 4 extremities w/o difficulty. Affect pleasant  Telemetry: A fib RVR 140-150s   Labs: Basic  Metabolic Panel:  Recent Labs Lab 05/11/15 0320  05/12/15 0340  05/13/15 0430 05/13/15 1635 05/14/15 0415 05/15/15 0405 05/16/15 0501 05/17/15 0510  NA 136  < > 133*  < > 133* 127* 128* 127* 127* 126*  K 4.5  < > 4.4  < > 4.0 4.3 4.2 3.8 3.3* 3.3*  CL 102  < > 100*  < > 103 97* 93* 93* 92* 91*  CO2 24  --  24  --  22  --  24 25 24 24   GLUCOSE 85  < > 99  < > 79 122* 103* 91 78 95  BUN 13  < > 11  < > 13 17 15 16 12 12   CREATININE 0.77  < > 0.82  < > 0.82 0.80 0.94 0.82 0.76 0.77  CALCIUM 7.9*  --  8.5*  --  7.9*  --  8.9 8.6* 8.0* 8.2*  MG 2.3  < > 1.9  --  1.6*  --  1.7 1.7 1.6* 1.9  PHOS 2.6  --  3.5  --  2.8  --   --   --   --   --   < > = values in this interval not displayed.  Liver Function Tests:  Recent Labs Lab 05/13/15 0430 05/14/15 0415 05/15/15 0405 05/16/15 0501 05/17/15 0510  AST 64* 68* 66* 57* 65*  ALT 25 29 30 26  30  ALKPHOS 48 74 89 91 106  BILITOT 9.8* 8.2* 7.4* 6.2* 6.3*  PROT 4.9* 5.4* 5.6* 5.3* 6.0*  ALBUMIN 2.5* 2.6* 3.0* 2.6* 2.8*   No results for input(s): LIPASE, AMYLASE in the last 168 hours. No results for input(s): AMMONIA in the last 168 hours.  CBC:  Recent Labs Lab 05/13/15 0430 05/13/15 1635 05/14/15 0415 05/15/15 0405 05/16/15 0501 05/17/15 0510  WBC 8.3  --  9.0 7.5 9.6 12.2*  NEUTROABS 6.2  --  6.4 4.9 6.5 8.6*  HGB 8.2* 9.5* 8.9* 8.8* 9.0* 9.4*  HCT 23.9* 28.0* 25.7* 25.3* 25.5* 26.9*  MCV 94.5  --  94.5 93.7 93.4 93.7  PLT 100*  --  136* 126* 178 291    INR:  Recent Labs Lab 05/13/15 0430 05/14/15 0415 05/15/15 0405 05/16/15 0501 05/17/15 0510  INR 1.33 1.37 2.54* 3.22* 3.18*    Other results:  EKG:   Imaging: Dg Chest Port 1 View  05/16/2015  CLINICAL DATA:  Left ventricular assist device placement EXAM: PORTABLE CHEST 1 VIEW COMPARISON:  May 15, 2015 FINDINGS: There is a left ventricular assist device present. Pacemaker is attached to the right ventricle. Central catheter tip is in the superior  vena cava. There has been interval removal of left chest tube without apparent pneumothorax. There is atelectatic change in the left mid lung bibasilar regions, stable. No new opacity. Heart is enlarged, stable. No adenopathy apparent. IMPRESSION: Left chest tube removed. No pneumothorax. Atelectasis in the left mid lung and basilar regions, stable. No new opacity. No change in cardiac silhouette. Electronically Signed   By: Lowella Grip III M.D.   On: 05/16/2015 07:48     Medications:     Scheduled Medications: . antiseptic oral rinse  7 mL Mouth Rinse BID  . aspirin EC  81 mg Oral Daily  . bisacodyl  10 mg Oral Daily   Or  . bisacodyl  10 mg Rectal Daily  . chlorpheniramine-HYDROcodone  5 mL Oral QHS  . citalopram  20 mg Oral Daily  . digoxin  0.125 mg Oral Daily  . docusate sodium  200 mg Oral Daily  . feeding supplement  1 Container Oral TID BM  . ferrous Q000111Q C-folic acid  1 capsule Oral TID PC  . guaiFENesin  600 mg Oral BID  . magnesium sulfate 1 - 4 g bolus IVPB  2 g Intravenous NOW  . megestrol  20 mg Oral Daily  . pantoprazole (PROTONIX) IV  40 mg Intravenous Q24H  . potassium chloride      . potassium chloride  40 mEq Oral TID  . sodium chloride flush  10 mL Intravenous Q12H  . warfarin  1 mg Oral q1800  . Warfarin - Physician Dosing Inpatient   Does not apply q1800    Infusions: . sodium chloride 10 mL/hr at 05/16/15 0800  . amiodarone 60 mg/hr (05/17/15 0710)   Followed by  . amiodarone    . milrinone 0.125 mcg/kg/min (05/17/15 0438)    PRN Medications: hydrALAZINE, ondansetron (ZOFRAN) IV, oxyCODONE, sodium chloride flush, sodium chloride flush, traMADol, zolpidem   Assessment:   1. Nonischemic cardiomyopathy: Long-standing, EF 20-25%.  Coronary angiography this admission with no significant CAD. Cardiogenic shock requiring milrinone and norepinephrine, IABP placed. Heartmate II LVAD placed 05/10/15.  2. Post-op blood loss anemia: 2 units  PRBCs 1/31.  3. Elevate bilirubin: Noted pre-op, thought due to RV failure.  Higher post-op with jaundice.  Elevated direct bilirubin, so liver source rather than  hemolysis.  4. Atrial flutter: Resolved on amiodarone.  5. Hyponatremia 6. Atrial fibrillation with RVR 2/7  Plan/Discussion:    Earlier this morning back in A fib RVR. Aysmptomatic. Reload amio. Stop po amio. K 3.3 Mag 1.9 . K supplemented. Give 2 grams of Mag now. Aim to keep K >4 and Mag >2  Today's co-ox is 54% in setting of HR 140s-150s. Continue milrinone 0.125 mcg, would not increase right now with RVR. Volume status elevated.   Restart lasix 40 mg IV twice a day once HR controlled.  He is volume overloaded. Place ted hose.    INR today 3.1, hemoglobin stable.  Continue ASA 81 mg daily.      Elevated total bilirubin.  Some elevation pre-op thought due to CHF, more marked post-op.  Trending down today but remains jaundiced.  Normal alkaline phosphatase.  LDH stable and not significantly elevated, so think significant hemolysis is not likely.  Elevated direct bilirubin suggests liver source.   Sodium 126. Continue 1800 cc po fluid restriction.   I reviewed the LVAD parameters from today, and compared the results to the patient's prior recorded data.  No programming changes were made.  The LVAD is functioning within specified parameters.  The patient performs LVAD self-test daily.  LVAD interrogation was negative for any significant power changes, alarms or PI events/speed drops.  LVAD equipment check completed and is in good working order.  Back-up equipment present.   LVAD education done on emergency procedures and precautions and reviewed exit site care.  Length of Stay: [place]  NP-C  05/17/2015, 7:28 AM  VAD Team --- VAD ISSUES ONLY--- Pager [phone] (7am - 7am)  Advanced Heart Failure Team  Pager [phone] (M-F; 7a - 4p)  Please contact [facility] for night-coverage after hours (4p -7a ) and weekends on  [email or web address]  Patient seen with NP, agree with the above note.   Co-ox 54% today but in setting of atrial fibrillation with rate to 140s.  Would keep milrinone at 0.125 for now with HR still very elevated.  Now improved in 120s on amiodarone gtt.  MAP stable.  Hopefully will convert on amiodarone, if does not could require DCCV.   Agree with echo to assess RV function.  If poor, Revatio would be reasonable step.   He is volume overloaded with weight up today.  He did not get any Lasix yesterday.  Will order Lasix 40 mg IV bid today.   Hemoglobin stable, WBCs up today.    [name] 05/17/2015 10:39 AM

## 2015-05-17 NOTE — Progress Notes (Addendum)
Patient ID: Patrick Humphrey, male   DOB: 08-Oct-1965, 50 y.o.   MRN: [id number]  Patient remains in atrial fibrillation, rate 110s-120s.  Feels fatigued, MAP stable.  - Increase amiodarone to 60 mg/hr and rebolus.  - If he does not come out of atrial fibrillation overnight, he may need TEE-guided DCCV tomorrow.   Echo done this afternoon: Moderate to severe RV systolic dysfunction.  He remains on milrinone 0.125.  Would try to avoid increasing while in rapid atrial fibrillation.  He also has a moderate pericardial effusion without tamponade.  Agree with addition of Revatio 20 mg tid.   [name] 05/17/2015 4:51 PM

## 2015-05-17 NOTE — Progress Notes (Signed)
Called VAD paged for sustained elevated heart rate and patient feeling fatigued.  Cloyde Reams said call D. Bensimhon. Ordered received and medications ordered. Pt resting with call bell within reach.  Will continue to monitor. Payton Emerald, RN

## 2015-05-17 NOTE — Progress Notes (Signed)
ANTICOAGULATION CONSULT NOTE - Initial Consult  Pharmacy Consult for Warfarin Indication: post LVAD  Allergies  Allergen Reactions  . Penicillins     Has patient had a PCN reaction causing immediate rash, facial/tongue/throat swelling, SOB or lightheadedness with hypotension: Yes Has patient had a PCN reaction causing severe rash involving mucus membranes or skin necrosis: No Has patient had a PCN reaction that required hospitalization No Has patient had a PCN reaction occurring within the last 10 years: No If all of the above answers are "NO", then may proceed with Cephalosporin use.  Unknown-childhood  . Sulfa Antibiotics     Patient Measurements: Height: 5\' 11"  (180.3 cm) Weight: 181 lb (82.1 kg) IBW/kg (Calculated) : 75.3   Vital Signs: Temp: 98.6 F (37 C) (02/07 0440) Temp Source: Oral (02/07 0440) Pulse Rate: 117 (02/07 0440)  Labs:  Recent Labs  05/15/15 0405 05/16/15 0501 05/17/15 0510  HGB 8.8* 9.0* 9.4*  HCT 25.3* 25.5* 26.9*  PLT 126* 178 291  LABPROT 27.0* 32.3* 32.0*  INR 2.54* 3.22* 3.18*  CREATININE 0.82 0.76 0.77    Estimated Creatinine Clearance: 117.7 mL/min (by C-G formula based on Cr of 0.77).   Medical History: Past Medical History  Diagnosis Date  . Nonischemic cardiomyopathy ([place])   . ICD (implantable cardioverter-defibrillator), single, in situ 08/26/2007  . Hx of echocardiogram 11/09/1910    Showed an EF of 25% with no significant valve disease.  Marland Kitchen AICD (automatic cardioverter/defibrillator) present   . Presence of permanent cardiac pacemaker   . CHF (congestive heart failure) (H[facility])       Assessment: 49yom with HF s/p LVAD placed 05/10/15.  Warfarin started for anticoagulation 05/12/15 with ASA 81mg  daily.  INR jumped quickly to 3.22 after 3 doses will give lower doses until it drifts back down.   CBC stable, no bleeding, runs Afib now on amiodarone and digoxin (level 0.4 after 3 doses - continue to watch trend)  LFTs stable.     Goal of Therapy:  INR 2-2.5 Monitor platelets by anticoagulation protocol: Yes   Plan:  Warfarin 1mg  x1 today Daily  And CBC Monitor s/s bleeding   Bonnita Nasuti Pharm.D. CPP, BCPS Clinical Pharmacist [phone] 05/17/2015 7:45 AM

## 2015-05-17 NOTE — Progress Notes (Signed)
CSW met with patient  And wife at bedside. Patient reports he has had high heart rate since this morning and "it is wiping me out". Patient states he was unable to do PT today due to fatigue. Patient's wife at bedside and very supportive. Wife reports she has been staying with her brother who is close by so she can come and go visiting patient. CSW will continue to follow for support throughout recovery. Jackie Brennan, LCSW [phone] 

## 2015-05-17 NOTE — Progress Notes (Signed)
HeartMate 2 Rounding Note     Postop day #7 HeartMate 2 LVAD implantation   Subjective:   History of nonischemic cardiomyopathy, ejection fraction 15% Preoperative cardiogenic shock on balloon pump and dual inotropes Preoperative protein deficiency malnutrition with prealbumin 8.4 Preoperative moderate RV dysfunction Preoperative hepatic insufficiency with bilirubin of 4.2  Status post AICD implantation 2002  The patient has continued to progress after surgery. He was extubated postop day 1. He is ambulating in the hallway. His VAD parameters have remained satisfactory at 9000 rpm. He has weaned off nitric oxide as well as epinephrine with stable RV performance. He is maintaining a sinus rhythm.His milrinone was weaned off with drop in CO-Ox and increase in CVP and is now on at 0.125  Several PI events last 24 hrs This morning the patient developed rapid atrial fibrillation is now back on IV amiodarone  Echocardiogram performed today personally reviewed showing good position of the inflow cannula, minimal aortic valve opening and at least moderate RV dysfunction without significant TR. Small 1.5 cm pericardial effusion  Chest tube drainage remains persistent but serous and the  mediastinal and pocket drains remain.CXR clear He is diuresing fairly well  And close to baseline Pain is well-controlled and neuro status is stable His appetite is poor with some nausea-  Bilirubin continues to improve and is now 6     LVAD INTERROGATION:  HeartMate II LVAD:  Flow 4.9 liters/min, speed 9000, power 5.2 PI 6.4  Controller Serial intact   Objective:    Vital Signs:   Temp:  [98.6 F (37 C)] 98.6 F (37 C) (02/07 0440) Pulse Rate:  [117-132] 132 (02/07 1125) Resp:  [20] 20 (02/07 0440) SpO2:  [96 %] 96 % (02/07 0440) Weight:  [181 lb (82.1 kg)] 181 lb (82.1 kg) (02/07 0057) Last BM Date: 05/16/15 Mean arterial Pressure  75-85  Intake/Output:   Intake/Output Summary (Last 24 hours)  at 05/17/15 2048 Last data filed at 05/17/15 1800  Gross per 24 hour  Intake     50 ml  Output    900 ml  Net   -850 ml     Physical Exam: General:  Well appearing. No resp difficulty, jaundiced skin improving HEENT: normal Neck: supple. JVP . Carotids 2+ bilat; no bruits. No lymphadenopathy or thryomegaly appreciated. Cor: Mechanical heart sounds with LVAD hum present. Lungs: clear Abdomen: soft, nontender, nondistended. No hepatosplenomegaly. No bruits or masses. Good bowel sounds. Extremities: no cyanosis, clubbing, rash, edema Neuro: alert & orientedx3, cranial nerves grossly intact. moves all 4 extremities w/o difficulty. Affect pleasant  Telemetry: sinus tach  Labs: Basic Metabolic Panel:  Recent Labs Lab 05/11/15 0320  05/12/15 0340  05/13/15 0430 05/13/15 1635 05/14/15 0415 05/15/15 0405 05/16/15 0501 05/17/15 0510  NA 136  < > 133*  < > 133* 127* 128* 127* 127* 126*  K 4.5  < > 4.4  < > 4.0 4.3 4.2 3.8 3.3* 3.3*  CL 102  < > 100*  < > 103 97* 93* 93* 92* 91*  CO2 24  --  24  --  22  --  24 25 24 24   GLUCOSE 85  < > 99  < > 79 122* 103* 91 78 95  BUN 13  < > 11  < > 13 17 15 16 12 12   CREATININE 0.77  < > 0.82  < > 0.82 0.80 0.94 0.82 0.76 0.77  CALCIUM 7.9*  --  8.5*  --  7.9*  --  8.9 8.6* 8.0* 8.2*  MG 2.3  < > 1.9  --  1.6*  --  1.7 1.7 1.6* 1.9  PHOS 2.6  --  3.5  --  2.8  --   --   --   --   --   < > = values in this interval not displayed.  Liver Function Tests:  Recent Labs Lab 05/13/15 0430 05/14/15 0415 05/15/15 0405 05/16/15 0501 05/17/15 0510  AST 64* 68* 66* 57* 65*  ALT 25 29 30 26 30   ALKPHOS 48 74 89 91 106  BILITOT 9.8* 8.2* 7.4* 6.2* 6.3*  PROT 4.9* 5.4* 5.6* 5.3* 6.0*  ALBUMIN 2.5* 2.6* 3.0* 2.6* 2.8*   No results for input(s): LIPASE, AMYLASE in the last 168 hours. No results for input(s): AMMONIA in the last 168 hours.  CBC:  Recent Labs Lab 05/13/15 0430 05/13/15 1635 05/14/15 0415 05/15/15 0405 05/16/15 0501  05/17/15 0510  WBC 8.3  --  9.0 7.5 9.6 12.2*  NEUTROABS 6.2  --  6.4 4.9 6.5 8.6*  HGB 8.2* 9.5* 8.9* 8.8* 9.0* 9.4*  HCT 23.9* 28.0* 25.7* 25.3* 25.5* 26.9*  MCV 94.5  --  94.5 93.7 93.4 93.7  PLT 100*  --  136* 126* 178 291    INR:  Recent Labs Lab 05/13/15 0430 05/14/15 0415 05/15/15 0405 05/16/15 0501 05/17/15 0510  INR 1.33 1.37 2.54* 3.22* 3.18*    Other results:  EKG:   Imaging: Dg Chest Port 1 View  05/17/2015  CLINICAL DATA:  LVAD EXAM: PORTABLE CHEST 1 VIEW COMPARISON:  05/16/2015 FINDINGS: Left ventricular assist device is again noted. Cardiac shadow is again mildly enlarged. A defibrillator is again seen and stable. Left-sided subclavian central venous line is again noted at the cavoatrial junction. No pneumothorax is seen. Left mid and lower lung atelectasis is again identified and stable. Small right-sided pleural effusion is noted. IMPRESSION: Stable left-sided atelectasis. Tubes and lines as described. Small right-sided pleural effusion Electronically Signed   By: Inez Catalina M.D.   On: 05/17/2015 07:53   Dg Chest Port 1 View  05/16/2015  CLINICAL DATA:  Left ventricular assist device placement EXAM: PORTABLE CHEST 1 VIEW COMPARISON:  May 15, 2015 FINDINGS: There is a left ventricular assist device present. Pacemaker is attached to the right ventricle. Central catheter tip is in the superior vena cava. There has been interval removal of left chest tube without apparent pneumothorax. There is atelectatic change in the left mid lung bibasilar regions, stable. No new opacity. Heart is enlarged, stable. No adenopathy apparent. IMPRESSION: Left chest tube removed. No pneumothorax. Atelectasis in the left mid lung and basilar regions, stable. No new opacity. No change in cardiac silhouette. Electronically Signed   By: Lowella Grip III M.D.   On: 05/16/2015 07:48     Medications:     Scheduled Medications: . antiseptic oral rinse  7 mL Mouth Rinse BID  .  aspirin EC  81 mg Oral Daily  . bisacodyl  10 mg Oral Daily   Or  . bisacodyl  10 mg Rectal Daily  . chlorpheniramine-HYDROcodone  5 mL Oral QHS  . citalopram  20 mg Oral Daily  . digoxin  0.125 mg Oral Daily  . docusate sodium  200 mg Oral Daily  . feeding supplement  1 Container Oral TID BM  . ferrous [id number] C-folic acid  1 capsule Oral TID PC  . furosemide  40 mg Intravenous BID  . guaiFENesin  600 mg Oral BID  .  megestrol  20 mg Oral Daily  . pantoprazole  40 mg Oral Daily  . potassium chloride  40 mEq Oral TID  . sildenafil  20 mg Oral TID  . sodium chloride flush  10 mL Intravenous Q12H  . vancomycin  1,000 mg Intravenous Q12H  . warfarin  1 mg Oral q1800  . Warfarin - Pharmacist Dosing Inpatient   Does not apply q1800    Infusions: . sodium chloride 10 mL/hr at 05/16/15 0800  . amiodarone 60 mg/hr (05/17/15 1945)  . milrinone 0.125 mcg/kg/min (05/17/15 1029)    PRN Medications: hydrALAZINE, ondansetron (ZOFRAN) IV, oxyCODONE, sodium chloride flush, sodium chloride flush, traMADol, zolpidem   Assessment:  Left subclavian triple-lumen catheter placed 2-2 after attempt at PICC line was unsuccessful Serous drainage from MT and pocket drains with stable Hb Advancing diet Coumadin  ordered-- INR 3.\1  Recurrent atrial fibrillation with evidence of RV dysfunction on echo Patient to be prepared for cardioversion in a.m. so he will be transferred back to ICU  Persistent chest tube drainage-chest tubes now to water seal Surgical incisions clean and dry however since chest tubes had been in place a week and his white count is greater than 12,000 Will cover with IV vancomycin until tubes are out   Plan/Discussion:   Continue diuresis with lasix Leave drains for output > 400 ml in 24 hr Advance diet, maximize protein intake , try megace to improve appetite  Follow bilirubin Continue with physical therapy after cardioversion back to sinus rhythm Continue with  VAD education Careful monitoring and therapy for RV dysfunction include sildenafil and milrinone and maintenance of sinus rhythm. Doubt he'll need pericardial window for the small effusion.     I reviewed the LVAD parameters from today, and compared the results to the patient's prior recorded data.  No programming changes were made.  The LVAD is functioning within specified parameters.  The patient performs LVAD self-test daily.  LVAD interrogation was negative for any significant power changes, alarms or PI events/speed drops.  LVAD equipment check completed and is in good working order.  Back-up equipment present.   LVAD education done on emergency procedures and precautions and reviewed exit site care.  Length of Stay: [place] III 05/17/2015, 8:48 PM

## 2015-05-17 NOTE — Progress Notes (Signed)
Pt has been busy and would like to rest right now. Will f/u after lunch. Yves Dill CES, ACSM 11:25 AM 05/17/2015

## 2015-05-18 ENCOUNTER — Encounter (HOSPITAL_COMMUNITY)
Admission: AD | Disposition: A | Discharge status: Home Health | Payer: Self-pay | Source: Ambulatory Visit | Attending: Cardiothoracic Surgery

## 2015-05-18 ENCOUNTER — Inpatient Hospital Stay (HOSPITAL_COMMUNITY): Payer: Medicare Other

## 2015-05-18 ENCOUNTER — Inpatient Hospital Stay (HOSPITAL_COMMUNITY): Payer: Medicare Other | Admitting: Anesthesiology

## 2015-05-18 DIAGNOSIS — I4891 Unspecified atrial fibrillation: Secondary | ICD-10-CM

## 2015-05-18 HISTORY — PX: CARDIOVERSION: SHX1299

## 2015-05-18 LAB — PROTIME-INR
INR: 3.73 — ABNORMAL HIGH (ref 0.00–1.49)
Prothrombin Time: 36 seconds — ABNORMAL HIGH (ref 11.6–15.2)

## 2015-05-18 LAB — COMPREHENSIVE METABOLIC PANEL
ALT: 30 U/L (ref 17–63)
AST: 57 U/L — ABNORMAL HIGH (ref 15–41)
Albumin: 2.5 g/dL — ABNORMAL LOW (ref 3.5–5.0)
Alkaline Phosphatase: 107 U/L (ref 38–126)
Anion gap: 11 (ref 5–15)
BUN: 12 mg/dL (ref 6–20)
CO2: 24 mmol/L (ref 22–32)
Calcium: 8.1 mg/dL — ABNORMAL LOW (ref 8.9–10.3)
Chloride: 92 mmol/L — ABNORMAL LOW (ref 101–111)
Creatinine, Ser: 0.78 mg/dL (ref 0.61–1.24)
GFR calc Af Amer: >60 mL/min (ref >60)
GFR calc non Af Amer: >60 mL/min (ref >60)
Glucose, Bld: 110 mg/dL — ABNORMAL HIGH (ref 65–99)
Potassium: 3.9 mmol/L (ref 3.5–5.1)
Sodium: 127 mmol/L — ABNORMAL LOW (ref 135–145)
Total Bilirubin: 5.2 mg/dL — ABNORMAL HIGH (ref 0.3–1.2)
Total Protein: 5.9 g/dL — ABNORMAL LOW (ref 6.5–8.1)

## 2015-05-18 LAB — MAGNESIUM: Magnesium: 2 mg/dL (ref 1.7–2.4)

## 2015-05-18 LAB — CARBOXYHEMOGLOBIN
Carboxyhemoglobin: 1.8 % — ABNORMAL HIGH (ref 0.5–1.5)
Methemoglobin: 0.7 % (ref 0.0–1.5)
O2 Saturation: 59.2 %
Total hemoglobin: 10.3 g/dL — ABNORMAL LOW (ref 13.5–18.0)

## 2015-05-18 LAB — LACTATE DEHYDROGENASE: LDH: 249 U/L — ABNORMAL HIGH (ref 98–192)

## 2015-05-18 LAB — GLUCOSE, CAPILLARY: Glucose-Capillary: 107 mg/dL — ABNORMAL HIGH (ref 65–99)

## 2015-05-18 SURGERY — CARDIOVERSION
Anesthesia: Monitor Anesthesia Care

## 2015-05-18 MED ORDER — FUROSEMIDE 10 MG/ML IJ SOLN
60.0000 mg | Freq: Two times a day (BID) | INTRAMUSCULAR | Status: DC
  Administered 2015-05-18 (×2): 60 mg via INTRAVENOUS
  Filled 2015-05-18 (×2): qty 6

## 2015-05-18 MED ORDER — SODIUM CHLORIDE 0.9% FLUSH: 3.0000 mL | INTRAVENOUS | Status: DC | PRN

## 2015-05-18 MED ORDER — SODIUM CHLORIDE 0.9% FLUSH
3.0000 mL | Freq: Two times a day (BID) | INTRAVENOUS | Status: DC
  Administered 2015-05-28 – 2015-05-29 (×2): 3 mL via INTRAVENOUS

## 2015-05-18 MED ORDER — SODIUM CHLORIDE 0.9 % IV SOLN: INTRAVENOUS | Status: DC

## 2015-05-18 MED ORDER — SODIUM CHLORIDE 0.9 % IV SOLN
250.0000 mL | INTRAVENOUS | Status: DC
  Administered 2015-05-26: 250 mL via INTRAVENOUS

## 2015-05-18 MED ORDER — ETOMIDATE 2 MG/ML IV SOLN
INTRAVENOUS | Status: DC | PRN
  Administered 2015-05-18: 2 mg via INTRAVENOUS
  Administered 2015-05-18: 4 mg via INTRAVENOUS
  Administered 2015-05-18: 10 mg via INTRAVENOUS
  Administered 2015-05-18: 4 mg via INTRAVENOUS

## 2015-05-18 MED ORDER — SODIUM CHLORIDE 0.9 % IV SOLN
INTRAVENOUS | Status: DC | PRN
  Administered 2015-05-18: 09:00:00 via INTRAVENOUS

## 2015-05-18 NOTE — Anesthesia Preprocedure Evaluation (Addendum)
Anesthesia Evaluation  Patient identified by MRN, date of birth, ID band Patient awake    Reviewed: Allergy & Precautions, NPO status , Patient's Chart, lab work & pertinent test results  History of Anesthesia Complications Negative for: history of anesthetic complications  Airway Mallampati: II  TM Distance: >3 FB Neck ROM: Full    Dental  (+) Dental Advisory Given   Pulmonary COPD,  COPD inhaler, former smoker (quit 1999),    breath sounds clear to auscultation       Cardiovascular hypertension, Pt. on medications and Pt. on home beta blockers (-) angina+ CAD and +CHF  + pacemaker + Cardiac Defibrillator  Rhythm:Irregular Rate:Normal  Cardiogenic shock LVAD, IABP 05/17/15 ECHO:  EF 15%, RV dilation and hypokinesis, mod pericardial effusion    Neuro/Psych negative neurological ROS     GI/Hepatic Neg liver ROS, GERD  Controlled and Medicated,  Endo/Other  negative endocrine ROS  Renal/GU negative Renal ROS     Musculoskeletal   Abdominal   Peds  Hematology  (+) Blood dyscrasia (Hb 9.4, Coumadin INR 3.73), ,   Anesthesia Other Findings   Reproductive/Obstetrics                           Anesthesia Physical Anesthesia Plan  ASA: IV  Anesthesia Plan: MAC   Post-op Pain Management:    Induction: Intravenous  Airway Management Planned: Nasal Cannula and Natural Airway  Additional Equipment:   Intra-op Plan:   Post-operative Plan:   Informed Consent: I have reviewed the patients History and Physical, chart, labs and discussed the procedure including the risks, benefits and alternatives for the proposed anesthesia with the patient or authorized representative who has indicated his/her understanding and acceptance.   Dental advisory given  Plan Discussed with: CRNA and Surgeon  Anesthesia Plan Comments: (Plan routine monitors, MAC)        Anesthesia Quick Evaluation

## 2015-05-18 NOTE — Progress Notes (Signed)
PT Cancellation Note  Patient Details Name: Patrick Humphrey MRN: [id number] DOB: 07-Dec-1965   Cancelled Treatment:    Reason Eval/Treat Not Completed: Patient declined, no reason specified  Pt s/p cardioversion with TEE this AM , reports still being lethargic and groggy as well as "beyond tired." Will follow up next available time.  Marguarite Arbour A Ariyanna Oien 05/18/2015, 3:20 PM [name], [place], DPT [phone]

## 2015-05-18 NOTE — Progress Notes (Signed)
OT Cancellation Note  Patient Details Name: Patrick Humphrey MRN: [id number] DOB: Aug 28, 1965   Cancelled Treatment:    Reason Eval/Treat Not Completed: Fatigue/lethargy limiting ability to participate  Malka So 05/18/2015, 4:08 PM

## 2015-05-18 NOTE — Procedures (Signed)
Electrical Cardioversion Procedure Note Patrick Humphrey [id number] April 07, 1966  Procedure: Electrical Cardioversion Indications:  Atrial Fibrillation  Procedure Details Consent: Risks of procedure as well as the alternatives and risks of each were explained to the (patient/caregiver).  Consent for procedure obtained. Time Out: Verified patient identification, verified procedure, site/side was marked, verified correct patient position, special equipment/implants available, medications/allergies/relevent history reviewed, required imaging and test results available.  Performed  Patient placed on cardiac monitor, pulse oximetry, supplemental oxygen as necessary.  Sedation given: Per anesthesiology Pacer pads placed anterior and posterior chest.  Cardioverted 1 time(s).  Cardioverted at [place].  Evaluation Findings: Post procedure EKG shows: NSR Complications: None Patient did tolerate procedure well.   [name] 05/18/2015, 9:50 AM

## 2015-05-18 NOTE — Progress Notes (Signed)
ANTICOAGULATION CONSULT NOTE   Pharmacy Consult for Warfarin Indication: post LVAD  Vital Signs: Temp: 97.8 F (36.6 C) (02/08 0400) Temp Source: Oral (02/08 0400) Pulse Rate: 122 (02/08 0700)  Labs:  Recent Labs  05/16/15 0501 05/17/15 0510 05/18/15 0527  HGB 9.0* 9.4*  --   HCT 25.5* 26.9*  --   PLT 178 291  --   LABPROT 32.3* 32.0* 36.0*  INR 3.22* 3.18* 3.73*  CREATININE 0.76 0.77 0.78    Estimated Creatinine Clearance: 117.7 mL/min (by C-G formula based on Cr of 0.78).   Assessment: Patrick Humphrey with HF s/p LVAD placed 05/10/15.  Warfarin started for anticoagulation 05/12/15 with ASA 81mg  daily.  INR jumped quickly to 3.22 after 3 doses, looked to be stabilizing but then bumped again to 3.7 overnight will hold tonight. No cbc this am, will check tomorrow.  Afib continued this am on amio gtt, now s/p successful cardioversion.  Goal of Therapy:  INR 2-2.5 Monitor platelets by anticoagulation protocol: Yes   Plan:  Hold Warfarin tonight  Daily INR for now Monitor s/s bleeding  Erin Hearing PharmD., BCPS Clinical Pharmacist Pager [phone] 05/18/2015 11:06 AM

## 2015-05-18 NOTE — CV Procedure (Signed)
Procedure: TEE  Indication: Atrial fibrillation, r/o thrombus.   Sedation: Per anesthesiology.  Findings: Please see echo section for full report.  The left ventricle was mildly dilated with EF 15% and septal akinesis.  The LVAD inflow cannula was visualized at the apex.  The RV with mildly dilated with severe systolic dysfunction.  Pacemaker in RV.  There was trivial TR.  The aortic valve did not open.  There was mild aortic insufficiency.  Trivial TR.  Mildly dilated LA with no LA appendage thrombus.  Normal RA.  Moderate pericardial effusion along the LV lateral wall, no tamponade.    Speed increased to 9200 rpm, septum remained midline to rightward.    The patient will be cardioverted.   Patrick Humphrey 05/18/2015 9:49 AM

## 2015-05-18 NOTE — Anesthesia Postprocedure Evaluation (Signed)
Anesthesia Post Note  Patient: Patrick Humphrey  Procedure(s) Performed: Procedure(s) (LRB): CARDIOVERSION WITH TEE (N/A)  Patient location during evaluation: SICU Anesthesia Type: MAC Level of consciousness: awake and alert, oriented and patient cooperative Pain management: pain level controlled Vital Signs Assessment: post-procedure vital signs reviewed and stable Respiratory status: spontaneous breathing, nonlabored ventilation, respiratory function stable and patient connected to nasal cannula oxygen Cardiovascular status: blood pressure returned to baseline and stable Postop Assessment: no signs of nausea or vomiting Anesthetic complications: no    Last Vitals:  Filed Vitals:   05/18/15 1100 05/18/15 1200  BP: 108/64 108/90  Pulse: 106 106  Temp:  36.7 C  Resp: 23 25    Last Pain:  Filed Vitals:   05/18/15 1255  PainSc: 0-No pain                 Delaynee Alred,E. Cuong Moorman

## 2015-05-18 NOTE — Progress Notes (Signed)
HeartMate 2 Rounding Note     Postop day #8 HeartMate 2 LVAD implantation   Subjective:   History of nonischemic cardiomyopathy, ejection fraction 15% Preoperative cardiogenic shock on balloon pump and dual inotropes Preoperative protein deficiency malnutrition with prealbumin 8.4 Preoperative moderate RV dysfunction Preoperative hepatic insufficiency with bilirubin of 4.2  Status post AICD implantation 2002  Overnight the patient was moved back to ICU for rapid atrial fibrillation and symptoms of right heart failure for cardioversion. This was successfully completed this morning.patient continued on low-dose milrinone and oral sildenafil being started  Echocardiogram performed  2-7 personally reviewed showing good position of the inflow cannula, minimal aortic valve opening and at least moderate RV dysfunction without significant TR. Small 1.5 cm pericardial effusion  Mediastinal drain has been pulled leaving only pocket drain. Coumadin on hold for INR greater than 3.0 with posterior small pericardial effusion     LVAD INTERROGATION:  HeartMate II LVAD:  Flow 4.8 liters/min, speed 9000, power 5.8 PI 6.8  Controller Serial intact   Objective:    Vital Signs:   Temp:  [97.5 F (36.4 C)-98 F (36.7 C)] 98 F (36.7 C) (02/08 0800) Pulse Rate:  [116-132] 122 (02/08 0700) Resp:  [18-25] 25 (02/08 0800) SpO2:  [91 %-95 %] 94 % (02/08 0600) Weight:  [180 lb 1.9 oz (81.7 kg)] 180 lb 1.9 oz (81.7 kg) (02/08 0500) Last BM Date: 05/18/15 Mean arterial Pressure  75-85  Intake/Output:   Intake/Output Summary (Last 24 hours) at 05/18/15 0959 Last data filed at 05/18/15 0955  Gross per 24 hour  Intake  495.4 ml  Output   1000 ml  Net -504.6 ml     Physical Exam: General:  Well appearing. No resp difficulty, jaundiced skin improving HEENT: normal Neck: supple. JVP . Carotids 2+ bilat; no bruits. No lymphadenopathy or thryomegaly appreciated. Cor: Mechanical heart sounds with  LVAD hum present. Lungs: clear Abdomen: soft, nontender, nondistended. No hepatosplenomegaly. No bruits or masses. Good bowel sounds. Extremities: no cyanosis, clubbing, rash, edema Neuro: alert & orientedx3, cranial nerves grossly intact. moves all 4 extremities w/o difficulty. Affect pleasant  Telemetry: sinus tach  Labs: Basic Metabolic Panel:  Recent Labs Lab 05/12/15 0340  05/13/15 0430  05/14/15 0415 05/15/15 0405 05/16/15 0501 05/17/15 0510 05/18/15 0450 05/18/15 0527  NA 133*  < > 133*  < > 128* 127* 127* 126*  --  127*  K 4.4  < > 4.0  < > 4.2 3.8 3.3* 3.3*  --  3.9  CL 100*  < > 103  < > 93* 93* 92* 91*  --  92*  CO2 24  --  22  --  24 25 24 24   --  24  GLUCOSE 99  < > 79  < > 103* 91 78 95  --  110*  BUN 11  < > 13  < > 15 16 12 12   --  12  CREATININE 0.82  < > 0.82  < > 0.94 0.82 0.76 0.77  --  0.78  CALCIUM 8.5*  --  7.9*  --  8.9 8.6* 8.0* 8.2*  --  8.1*  MG 1.9  --  1.6*  --  1.7 1.7 1.6* 1.9 2.0  --   PHOS 3.5  --  2.8  --   --   --   --   --   --   --   < > = values in this interval not displayed.  Liver Function Tests:  Recent  Labs Lab 05/14/15 0415 05/15/15 0405 05/16/15 0501 05/17/15 0510 05/18/15 0527  AST 68* 66* 57* 65* 57*  ALT 29 30 26 30 30   ALKPHOS 74 89 91 106 107  BILITOT 8.2* 7.4* 6.2* 6.3* 5.2*  PROT 5.4* 5.6* 5.3* 6.0* 5.9*  ALBUMIN 2.6* 3.0* 2.6* 2.8* 2.5*   No results for input(s): LIPASE, AMYLASE in the last 168 hours. No results for input(s): AMMONIA in the last 168 hours.  CBC:  Recent Labs Lab 05/13/15 0430 05/13/15 1635 05/14/15 0415 05/15/15 0405 05/16/15 0501 05/17/15 0510  WBC 8.3  --  9.0 7.5 9.6 12.2*  NEUTROABS 6.2  --  6.4 4.9 6.5 8.6*  HGB 8.2* 9.5* 8.9* 8.8* 9.0* 9.4*  HCT 23.9* 28.0* 25.7* 25.3* 25.5* 26.9*  MCV 94.5  --  94.5 93.7 93.4 93.7  PLT 100*  --  136* 126* 178 291    INR:  Recent Labs Lab 05/14/15 0415 05/15/15 0405 05/16/15 0501 05/17/15 0510 05/18/15 0527  INR 1.37 2.54* 3.22*  3.18* 3.73*    Other results:  EKG:   Imaging: Dg Chest Port 1 View  05/18/2015  ADDENDUM REPORT: 05/18/2015 07:50 ADDENDUM: Critical Value/emergent results were called by telephone at the time of interpretation on 05/18/2015 at 7:48 am to Dr. Marlyce Huge, who verbally acknowledged these results. Electronically Signed   By: Lowella Grip III M.D.   On: 05/18/2015 07:50  05/18/2015  CLINICAL DATA:  Status post left ventricular assist device placement. Cardiac arrhythmia. EXAM: PORTABLE CHEST 1 VIEW COMPARISON:  May 17, 2015 FINDINGS: Left ventricular assist device present. Pacemaker lead is attached to the right ventricle, stable. There is stable cardiomegaly with pulmonary vascularity within normal limits. Left central catheter tip is in the superior vena cava. There is a small left apical pneumothorax evident. There is atelectatic change in the left mid lung. There is consolidation in the left lower lobe with small left effusion. There is mild right base atelectasis, stable. Right lung is otherwise clear. No new opacity. No adenopathy evident. IMPRESSION: No appreciable change from 1 day prior. Cardiomegaly is stable. Consolidation with small left effusion left lower lobe remains as does atelectasis in the left mid lung. Mild right base atelectasis is also stable. No new opacity. There is a subtle small left apical pneumothorax which is unchanged compared to 1 day prior without tension component. Electronically Signed: By: Lowella Grip III M.D. On: 05/18/2015 07:37   Dg Chest Port 1 View  05/17/2015  CLINICAL DATA:  LVAD EXAM: PORTABLE CHEST 1 VIEW COMPARISON:  05/16/2015 FINDINGS: Left ventricular assist device is again noted. Cardiac shadow is again mildly enlarged. A defibrillator is again seen and stable. Left-sided subclavian central venous line is again noted at the cavoatrial junction. No pneumothorax is seen. Left mid and lower lung atelectasis is again identified and stable. Small  right-sided pleural effusion is noted. IMPRESSION: Stable left-sided atelectasis. Tubes and lines as described. Small right-sided pleural effusion Electronically Signed   By: Inez Catalina M.D.   On: 05/17/2015 07:53     Medications:     Scheduled Medications: . antiseptic oral rinse  7 mL Mouth Rinse BID  . aspirin EC  81 mg Oral Daily  . bisacodyl  10 mg Oral Daily   Or  . bisacodyl  10 mg Rectal Daily  . chlorpheniramine-HYDROcodone  5 mL Oral QHS  . citalopram  20 mg Oral Daily  . digoxin  0.125 mg Oral Daily  . docusate sodium  200 mg  Oral Daily  . feeding supplement  1 Container Oral TID BM  . ferrous Q000111Q C-folic acid  1 capsule Oral TID PC  . furosemide  60 mg Intravenous BID  . guaiFENesin  600 mg Oral BID  . megestrol  20 mg Oral Daily  . pantoprazole  40 mg Oral Daily  . potassium chloride  40 mEq Oral TID  . sildenafil  20 mg Oral TID  . sodium chloride flush  10 mL Intravenous Q12H  . sodium chloride flush  3 mL Intravenous Q12H  . vancomycin  1,000 mg Intravenous Q12H  . Warfarin - Pharmacist Dosing Inpatient   Does not apply q1800    Infusions: . sodium chloride 10 mL/hr at 05/18/15 0400  . sodium chloride    . sodium chloride    . amiodarone 30 mg/hr (05/18/15 0745)  . milrinone 0.125 mcg/kg/min (05/18/15 0400)    PRN Medications: hydrALAZINE, ondansetron (ZOFRAN) IV, oxyCODONE, sodium chloride flush, sodium chloride flush, sodium chloride flush, traMADol, zolpidem   Assessment:  Left subclavian triple-lumen catheter placed 2-2 after attempt at PICC line was unsuccessful Recurrent atrial fibrillation with evidence of RV dysfunction on echo-treated with DC cardioversion Postoperative jaundice slowly improving bilirubin now 5.0  Persistent chest tube drainage-chest tube now to water seal Surgical incisions clean and dry however since chest tubes had been in place a week and his white count is greater than 12,000 Will cover with IV  vancomycin until tubes are out   Plan/Discussion:   Continue diuresis with lasix  Advance diet, maximize protein intake , try megace to improve appetite  Follow bilirubin Continue with physical therapy after cardioversion back to sinus rhythm Continue with VAD education Careful monitoring and therapy for RV dysfunction include sildenafil and milrinone and maintenance of sinus rhythm. Doubt he'll need pericardial window for the small effusion.     I reviewed the LVAD parameters from today, and compared the results to the patient's prior recorded data.  No programming changes were made.  The LVAD is functioning within specified parameters.  The patient performs LVAD self-test daily.  LVAD interrogation was negative for any significant power changes, alarms or PI events/speed drops.  LVAD equipment check completed and is in good working order.  Back-up equipment present.   LVAD education done on emergency procedures and precautions and reviewed exit site care.  Length of Stay: [place] III 05/18/2015, 9:59 AM

## 2015-05-18 NOTE — Progress Notes (Signed)
Patient ID: Patrick Humphrey, male   DOB: 01-Dec-1965, 50 y.o.   MRN: [id number] HeartMate 2 Rounding Note  Subjective:    LVAD placement 05/10/15.   Extubated 2/1.  Got 2 units PRBCs 1/31. Hemoglobin 8.6>8.2>8.9>9>9.4>10.3 (on CO-OX).  Chest tube in.    He is jaundiced, total bilirubin increased.  Pre-op 4.2 => post-op 9.8 => 8.6 => 7.4 => 6.2 => 6.3 => 5.2  (elevated direct bilirubin as well).   Yesterday, back in A fib RVR. Amio reloaded. Has not converted. Diuresed with IV lasix. Weight down 1 pound. ECHO EF 15% with severe RV dysfunction and moderate pericardial effusion without tamponade.   Complaining of fatigue.   LVAD INTERROGATION:  HeartMate II LVAD:  Flow 4.0 liters/min, speed 9000,  power 4.7 , PI 7.1 Rare PI events.    Objective:    Vital Signs:   Temp:  [97.5 F (36.4 C)-97.8 F (36.6 C)] 97.8 F (36.6 C) (02/08 0400) Pulse Rate:  [116-132] 122 (02/08 0600) Resp:  [18-23] 21 (02/08 0600) SpO2:  [91 %-95 %] 94 % (02/08 0600) Weight:  [180 lb 1.9 oz (81.7 kg)] 180 lb 1.9 oz (81.7 kg) (02/08 0500) Last BM Date: 05/18/15 Mean arterial Pressure 80s  Intake/Output:   Intake/Output Summary (Last 24 hours) at 05/18/15 0701 Last data filed at 05/18/15 0600  Gross per 24 hour  Intake  374.1 ml  Output   1250 ml  Net -875.9 ml     Physical Exam: General:  Fatigued appearing. Jaundice. No resp difficulty. In bed.   HEENT: normal Neck: supple. JVP ~10. Carotids 2+ bilat; no bruits. No lymphadenopathy or thryomegaly appreciated. L subclavian TLC Cor: Mechanical heart sounds with LVAD hum present. Lungs: Decreased in the bases.  Abdomen: soft, nontender, nondistended. No hepatosplenomegaly. No bruits or masses. Good bowel sounds. Driveline: C/D/I; securement device intact and driveline incorporated Extremities: no cyanosis, clubbing, rash, R and LLE ted hose. 2+ edema. Red rash R and LLE 1/2 way up.  Neuro: alert & orientedx3, cranial nerves grossly intact. moves all  4 extremities w/o difficulty. Affect pleasant  Telemetry: A fib RVR 120s   Labs: Basic Metabolic Panel:  Recent Labs Lab 05/12/15 0340  05/13/15 0430  05/14/15 0415 05/15/15 0405 05/16/15 0501 05/17/15 0510 05/18/15 0527  NA 133*  < > 133*  < > 128* 127* 127* 126* 127*  K 4.4  < > 4.0  < > 4.2 3.8 3.3* 3.3* 3.9  CL 100*  < > 103  < > 93* 93* 92* 91* 92*  CO2 24  --  22  --  24 25 24 24 24   GLUCOSE 99  < > 79  < > 103* 91 78 95 110*  BUN 11  < > 13  < > 15 16 12 12 12   CREATININE 0.82  < > 0.82  < > 0.94 0.82 0.76 0.77 0.78  CALCIUM 8.5*  --  7.9*  --  8.9 8.6* 8.0* 8.2* 8.1*  MG 1.9  --  1.6*  --  1.7 1.7 1.6* 1.9  --   PHOS 3.5  --  2.8  --   --   --   --   --   --   < > = values in this interval not displayed.  Liver Function Tests:  Recent Labs Lab 05/14/15 0415 05/15/15 0405 05/16/15 0501 05/17/15 0510 05/18/15 0527  AST 68* 66* 57* 65* 57*  ALT 29 30 26 30 30   ALKPHOS 74 89 91  106 107  BILITOT 8.2* 7.4* 6.2* 6.3* 5.2*  PROT 5.4* 5.6* 5.3* 6.0* 5.9*  ALBUMIN 2.6* 3.0* 2.6* 2.8* 2.5*   No results for input(s): LIPASE, AMYLASE in the last 168 hours. No results for input(s): AMMONIA in the last 168 hours.  CBC:  Recent Labs Lab 05/13/15 0430 05/13/15 1635 05/14/15 0415 05/15/15 0405 05/16/15 0501 05/17/15 0510  WBC 8.3  --  9.0 7.5 9.6 12.2*  NEUTROABS 6.2  --  6.4 4.9 6.5 8.6*  HGB 8.2* 9.5* 8.9* 8.8* 9.0* 9.4*  HCT 23.9* 28.0* 25.7* 25.3* 25.5* 26.9*  MCV 94.5  --  94.5 93.7 93.4 93.7  PLT 100*  --  136* 126* 178 291    INR:  Recent Labs Lab 05/14/15 0415 05/15/15 0405 05/16/15 0501 05/17/15 0510 05/18/15 0527  INR 1.37 2.54* 3.22* 3.18* 3.73*    Other results:  EKG:   Imaging: Dg Chest Port 1 View  05/17/2015  CLINICAL DATA:  LVAD EXAM: PORTABLE CHEST 1 VIEW COMPARISON:  05/16/2015 FINDINGS: Left ventricular assist device is again noted. Cardiac shadow is again mildly enlarged. A defibrillator is again seen and stable. Left-sided  subclavian central venous line is again noted at the cavoatrial junction. No pneumothorax is seen. Left mid and lower lung atelectasis is again identified and stable. Small right-sided pleural effusion is noted. IMPRESSION: Stable left-sided atelectasis. Tubes and lines as described. Small right-sided pleural effusion Electronically Signed   By: Inez Catalina M.D.   On: 05/17/2015 07:53     Medications:     Scheduled Medications: . antiseptic oral rinse  7 mL Mouth Rinse BID  . aspirin EC  81 mg Oral Daily  . bisacodyl  10 mg Oral Daily   Or  . bisacodyl  10 mg Rectal Daily  . chlorpheniramine-HYDROcodone  5 mL Oral QHS  . citalopram  20 mg Oral Daily  . digoxin  0.125 mg Oral Daily  . docusate sodium  200 mg Oral Daily  . feeding supplement  1 Container Oral TID BM  . ferrous Q000111Q C-folic acid  1 capsule Oral TID PC  . furosemide  40 mg Intravenous BID  . guaiFENesin  600 mg Oral BID  . megestrol  20 mg Oral Daily  . pantoprazole  40 mg Oral Daily  . potassium chloride  40 mEq Oral TID  . sildenafil  20 mg Oral TID  . sodium chloride flush  10 mL Intravenous Q12H  . vancomycin  1,000 mg Intravenous Q12H  . warfarin  1 mg Oral q1800  . Warfarin - Pharmacist Dosing Inpatient   Does not apply q1800    Infusions: . sodium chloride 10 mL/hr at 05/18/15 0400  . amiodarone 60 mg/hr (05/18/15 0400)  . milrinone 0.125 mcg/kg/min (05/18/15 0400)    PRN Medications: hydrALAZINE, ondansetron (ZOFRAN) IV, oxyCODONE, sodium chloride flush, sodium chloride flush, traMADol, zolpidem   Assessment:   1. Nonischemic cardiomyopathy: Long-standing, EF 20-25%.  Coronary angiography this admission with no significant CAD. Cardiogenic shock requiring milrinone and norepinephrine, IABP placed. Heartmate II LVAD placed 05/10/15.  2. Post-op blood loss anemia: 2 units PRBCs 1/31.  3. Elevate bilirubin: Noted pre-op, thought due to RV failure.  Higher post-op with jaundice.  Elevated  direct bilirubin, so liver source rather than hemolysis.  4. Atrial flutter: Resolved on amiodarone.  5. Hyponatremia 6. Atrial fibrillation with RVR 2/7 7. RV failure: Revatio begun 2/7.  8. Small left apical PTX.   Plan/Discussion:   ECHO with severe RV  dysfunction and mod pericardial effusion. Continue sildenafil 20 mg tid and milrinone 0.125 mcg.   Remains in A fib RVR despite amio drip with multiple boluses.  K 3.9 . Check Mag.  Aim to keep K >4 and Mag >2. Will need TEE/DC-CV today.   Today's co-ox is 59%.  Continue milrinone 0.125 mcg, would not increase right now with RVR.   Increase Lasix to 60 mg IV bid. Renal function ok. Set up CVP.     INR today 3.73, hemoglobin stable.  Holding coumadin today. Continue ASA 81 mg daily.      Total bilirubin trending down.  Some elevation pre-op thought due to CHF, more marked post-op. Normal alkaline phosphatase.  LDH stable and not significantly elevated, so think significant hemolysis is not likely.  Elevated direct bilirubin suggests liver source.   Sodium 127. Continue 1800 cc po fluid restriction.   I reviewed the LVAD parameters from today, and compared the results to the patient's prior recorded data.  No programming changes were made.  The LVAD is functioning within specified parameters.  The patient performs LVAD self-test daily.  LVAD interrogation was negative for any significant power changes, alarms or PI events/speed drops.  LVAD equipment check completed and is in good working order.  Back-up equipment present.   LVAD education done on emergency procedures and precautions and reviewed exit site care.  Length of Stay: [place]  NP-C  05/18/2015, 7:01 AM  VAD Team --- VAD ISSUES ONLY--- Pager [phone] (7am - 7am)  Advanced Heart Failure Team  Pager [phone] (M-F; 7a - 4p)  Please contact [facility] for night-coverage after hours (4p -7a ) and weekends on [email or web address]  Patient seen with NP, agree with the above note.    He remains in atrial fibrillation with RVR in 120s-130s despite amiodarone at 60 mg/hr.  Feels fatigued/bed today.  I think that we need to try to get him back into NSR.  - Will arrange for TEE-guided DCCV today.   MAP stable in 80s, co-ox marginal but acceptable at 59%.  He remains on milrinone at 0.125.  Echo yesterday showed severe RV dysfunction.  He also remains volume overloaded.   - Continue current milrinone and Revatio.  - Increase Lasix to 60 mg IV bid.   Small apical PTX on left on CXR today, radiologist says same as yesterday.    Stable creatinine, hemoglobin, and LDH.   35 minutes critical care time.   [name] 05/18/2015 7:52 AM

## 2015-05-18 NOTE — Transfer of Care (Signed)
Immediate Anesthesia Transfer of Care Note  Patient: Patrick Humphrey  Procedure(s) Performed: Procedure(s): CARDIOVERSION WITH TEE (N/A)  Patient Location: SICU  Anesthesia Type:MAC  Level of Consciousness: awake, patient cooperative and responds to stimulation  Airway & Oxygen Therapy: Patient Spontanous Breathing and Patient connected to nasal cannula oxygen  Post-op Assessment: Report given to RN and Post -op Vital signs reviewed and stable  Post vital signs: Reviewed and stable  Last Vitals:  Filed Vitals:   05/18/15 0700 05/18/15 0800  BP:    Pulse: 122   Temp:  36.7 C  Resp: 24 25    Complications: No apparent anesthesia complications

## 2015-05-19 ENCOUNTER — Encounter (HOSPITAL_COMMUNITY): Payer: Self-pay | Admitting: Cardiology

## 2015-05-19 ENCOUNTER — Inpatient Hospital Stay (HOSPITAL_COMMUNITY): Payer: Medicare Other

## 2015-05-19 LAB — COMPREHENSIVE METABOLIC PANEL
ALT: 29 U/L (ref 17–63)
AST: 59 U/L — ABNORMAL HIGH (ref 15–41)
Albumin: 2.4 g/dL — ABNORMAL LOW (ref 3.5–5.0)
Alkaline Phosphatase: 110 U/L (ref 38–126)
Anion gap: 10 (ref 5–15)
BUN: 11 mg/dL (ref 6–20)
CO2: 24 mmol/L (ref 22–32)
Calcium: 7.9 mg/dL — ABNORMAL LOW (ref 8.9–10.3)
Chloride: 90 mmol/L — ABNORMAL LOW (ref 101–111)
Creatinine, Ser: 0.84 mg/dL (ref 0.61–1.24)
GFR calc Af Amer: >60 mL/min (ref >60)
GFR calc non Af Amer: >60 mL/min (ref >60)
Glucose, Bld: 100 mg/dL — ABNORMAL HIGH (ref 65–99)
Potassium: 3.8 mmol/L (ref 3.5–5.1)
Sodium: 124 mmol/L — ABNORMAL LOW (ref 135–145)
Total Bilirubin: 5.5 mg/dL — ABNORMAL HIGH (ref 0.3–1.2)
Total Protein: 5.7 g/dL — ABNORMAL LOW (ref 6.5–8.1)

## 2015-05-19 LAB — CARBOXYHEMOGLOBIN
Carboxyhemoglobin: 2 % — ABNORMAL HIGH (ref 0.5–1.5)
Methemoglobin: 0.8 % (ref 0.0–1.5)
O2 Saturation: 58.6 %
Total hemoglobin: 10 g/dL — ABNORMAL LOW (ref 13.5–18.0)

## 2015-05-19 LAB — CBC
HCT: 29.5 % — ABNORMAL LOW (ref 39.0–52.0)
Hemoglobin: 9.8 g/dL — ABNORMAL LOW (ref 13.0–17.0)
MCH: 31.2 pg (ref 26.0–34.0)
MCHC: 33.2 g/dL (ref 30.0–36.0)
MCV: 93.9 fL (ref 78.0–100.0)
Platelets: 393 10*3/uL (ref 150–400)
RBC: 3.14 MIL/uL — ABNORMAL LOW (ref 4.22–5.81)
RDW: 18 % — ABNORMAL HIGH (ref 11.5–15.5)
WBC: 16.6 10*3/uL — ABNORMAL HIGH (ref 4.0–10.5)

## 2015-05-19 LAB — LACTATE DEHYDROGENASE: LDH: 290 U/L — ABNORMAL HIGH (ref 98–192)

## 2015-05-19 LAB — PROTIME-INR
INR: 2.98 — ABNORMAL HIGH (ref 0.00–1.49)
Prothrombin Time: 30.5 seconds — ABNORMAL HIGH (ref 11.6–15.2)

## 2015-05-19 MED ORDER — POTASSIUM CHLORIDE CRYS ER 20 MEQ PO TBCR
40.0000 meq | EXTENDED_RELEASE_TABLET | Freq: Two times a day (BID) | ORAL | Status: DC
  Administered 2015-05-19 (×2): 40 meq via ORAL
  Filled 2015-05-19 (×2): qty 2

## 2015-05-19 MED ORDER — LISINOPRIL 10 MG PO TABS: 5.0000 mg | ORAL_TABLET | Freq: Every day | ORAL | Status: DC

## 2015-05-19 MED ORDER — LOSARTAN POTASSIUM 25 MG PO TABS
25.0000 mg | ORAL_TABLET | Freq: Two times a day (BID) | ORAL | Status: DC
  Administered 2015-05-19 – 2015-05-20 (×2): 25 mg via ORAL
  Filled 2015-05-19 (×2): qty 1

## 2015-05-19 MED ORDER — LOSARTAN POTASSIUM 25 MG PO TABS
25.0000 mg | ORAL_TABLET | Freq: Every day | ORAL | Status: DC
  Administered 2015-05-19: 25 mg via ORAL
  Filled 2015-05-19: qty 1

## 2015-05-19 MED ORDER — AMIODARONE HCL IN DEXTROSE 360-4.14 MG/200ML-% IV SOLN
30.0000 mg/h | INTRAVENOUS | Status: DC
  Administered 2015-05-19 – 2015-05-21 (×4): 30 mg/h via INTRAVENOUS
  Filled 2015-05-19 (×4): qty 200

## 2015-05-19 MED ORDER — ALBUMIN HUMAN 5 % IV SOLN
INTRAVENOUS | Status: AC
  Filled 2015-05-19: qty 250

## 2015-05-19 MED ORDER — FUROSEMIDE 10 MG/ML IJ SOLN
80.0000 mg | Freq: Two times a day (BID) | INTRAMUSCULAR | Status: DC
  Administered 2015-05-19 (×2): 80 mg via INTRAVENOUS
  Filled 2015-05-19 (×2): qty 8

## 2015-05-19 MED ORDER — SPIRONOLACTONE 25 MG PO TABS
12.5000 mg | ORAL_TABLET | Freq: Every day | ORAL | Status: DC
  Administered 2015-05-19: 12.5 mg via ORAL
  Filled 2015-05-19: qty 1

## 2015-05-19 MED ORDER — WARFARIN SODIUM 1 MG PO TABS
1.0000 mg | ORAL_TABLET | Freq: Once | ORAL | Status: AC
  Administered 2015-05-19: 1 mg via ORAL
  Filled 2015-05-19: qty 1

## 2015-05-19 MED ORDER — ALBUMIN HUMAN 5 % IV SOLN
12.5000 g | INTRAVENOUS | Status: AC | PRN
  Administered 2015-05-19 – 2015-05-20 (×2): 12.5 g via INTRAVENOUS
  Filled 2015-05-19: qty 250

## 2015-05-19 MED ORDER — HYDRALAZINE HCL 20 MG/ML IJ SOLN: 10.0000 mg | INTRAMUSCULAR | Status: DC | PRN

## 2015-05-19 MED ORDER — DEXTROSE 5 % IV SOLN
1.0000 g | Freq: Three times a day (TID) | INTRAVENOUS | Status: DC
  Administered 2015-05-19 – 2015-05-22 (×10): 1 g via INTRAVENOUS
  Filled 2015-05-19 (×12): qty 1

## 2015-05-19 NOTE — Progress Notes (Signed)
HeartMate 2 Rounding Note     Postop day #9 HeartMate 2 LVAD implantation   Subjective:   History of nonischemic cardiomyopathy, ejection fraction 15% Preoperative cardiogenic shock on balloon pump and dual inotropes Preoperative protein deficiency malnutrition with prealbumin 8.4 Preoperative moderate RV dysfunction Preoperative hepatic insufficiency with bilirubin of 4.2 Status post AICD implantation 2002  Patient is maintained sinus rhythm after cardioversion yesterday and feels better-better strength, better appetite  able to stand and walk in the hallway.  His VAD parameters remain excellent and he is diuresing well.  Last mediastinal drain ( pocket drain) was removed today.  He has postoperative liver insufficiency with jaundice, bilirubin now down to 5.1  He has postoperative elevation in white count on empiric antibiotics pending cultures  He has postoperative mild posterior pericardial effusion probably related to his poor nutritional status and reduction in LV size with VAD  LVAD INTERROGATION:  HeartMate II LVAD:  Flow 4.8 liters/min, speed 9200, power 5.8 PI 6.8  Controller Serial intact   Objective:    Vital Signs:   Temp:  [97.6 F (36.4 C)-98.5 F (36.9 C)] 97.6 F (36.4 C) (02/09 1940) Pulse Rate:  [85-113] 101 (02/09 1700) Resp:  [17-29] 18 (02/09 1400) BP: (96-124)/(71-102) 96/86 mmHg (02/09 1900) SpO2:  [93 %-100 %] 97 % (02/09 1900) Weight:  [180 lb 8.9 oz (81.9 kg)] 180 lb 8.9 oz (81.9 kg) (02/09 0500) Last BM Date: 05/18/15 Mean arterial Pressure  75-85  Intake/Output:   Intake/Output Summary (Last 24 hours) at 05/19/15 1959 Last data filed at 05/19/15 1800  Gross per 24 hour  Intake 1253.1 ml  Output   3060 ml  Net -1806.9 ml     Physical Exam: General:  Well appearing. No resp difficulty, jaundiced skin improving HEENT: normal Neck: supple. JVP . Carotids 2+ bilat; no bruits. No lymphadenopathy or thryomegaly appreciated. Cor:  Mechanical heart sounds with LVAD hum present. Lungs: clear Abdomen: soft, nontender, nondistended. No hepatosplenomegaly. No bruits or masses. Good bowel sounds. Extremities: no cyanosis, clubbing, rash, edema Neuro: alert & orientedx3, cranial nerves grossly intact. moves all 4 extremities w/o difficulty. Affect pleasant  Telemetry: sinus tach  Labs: Basic Metabolic Panel:  Recent Labs Lab 05/13/15 0430  05/14/15 0415 05/15/15 0405 05/16/15 0501 05/17/15 0510 05/18/15 0450 05/18/15 0527 05/19/15 0730  NA 133*  < > 128* 127* 127* 126*  --  127* 124*  K 4.0  < > 4.2 3.8 3.3* 3.3*  --  3.9 3.8  CL 103  < > 93* 93* 92* 91*  --  92* 90*  CO2 22  --  24 25 24 24   --  24 24  GLUCOSE 79  < > 103* 91 78 95  --  110* 100*  BUN 13  < > 15 16 12 12   --  12 11  CREATININE 0.82  < > 0.94 0.82 0.76 0.77  --  0.78 0.84  CALCIUM 7.9*  --  8.9 8.6* 8.0* 8.2*  --  8.1* 7.9*  MG 1.6*  --  1.7 1.7 1.6* 1.9 2.0  --   --   PHOS 2.8  --   --   --   --   --   --   --   --   < > = values in this interval not displayed.  Liver Function Tests:  Recent Labs Lab 05/15/15 0405 05/16/15 0501 05/17/15 0510 05/18/15 0527 05/19/15 0730  AST 66* 57* 65* 57* 59*  ALT 30 26 30  30 29  ALKPHOS 89 91 106 107 110  BILITOT 7.4* 6.2* 6.3* 5.2* 5.5*  PROT 5.6* 5.3* 6.0* 5.9* 5.7*  ALBUMIN 3.0* 2.6* 2.8* 2.5* 2.4*   No results for input(s): LIPASE, AMYLASE in the last 168 hours. No results for input(s): AMMONIA in the last 168 hours.  CBC:  Recent Labs Lab 05/13/15 0430  05/14/15 0415 05/15/15 0405 05/16/15 0501 05/17/15 0510 05/19/15 0455  WBC 8.3  --  9.0 7.5 9.6 12.2* 16.6*  NEUTROABS 6.2  --  6.4 4.9 6.5 8.6*  --   HGB 8.2*  < > 8.9* 8.8* 9.0* 9.4* 9.8*  HCT 23.9*  < > 25.7* 25.3* 25.5* 26.9* 29.5*  MCV 94.5  --  94.5 93.7 93.4 93.7 93.9  PLT 100*  --  136* 126* 178 291 393  < > = values in this interval not displayed.  INR:  Recent Labs Lab 05/15/15 0405 05/16/15 0501  05/17/15 0510 05/18/15 0527 05/19/15 0455  INR 2.54* 3.22* 3.18* 3.73* 2.98*    Other results:  EKG:   Imaging: Dg Chest Port 1 View  05/19/2015  CLINICAL DATA:  Chest tube removal prior to physical therapy EXAM: PORTABLE CHEST 1 VIEW COMPARISON:  05/18/2015 FINDINGS: Heart is enlarged. Status post median sternotomy. Left ventricular assist device. Patient has right-sided transvenous pacemaker/ AICD with lead to the right ventricle. Patient has a left-sided central line, tip to the superior vena cava. Left pneumothorax is smaller, estimated be approximately 5% of left lung volume. There is opacity of both lung bases, similar in appearance to prior study. IMPRESSION: 1. Smaller left pneumothorax. 2. Persistent bibasilar opacity, left greater than right. Electronically Signed   By: Nolon Nations M.D.   On: 05/19/2015 10:47   Dg Chest Port 1 View  05/18/2015  ADDENDUM REPORT: 05/18/2015 07:50 ADDENDUM: Critical Value/emergent results were called by telephone at the time of interpretation on 05/18/2015 at 7:48 am to Dr. Marlyce Huge, who verbally acknowledged these results. Electronically Signed   By: Lowella Grip III M.D.   On: 05/18/2015 07:50  05/18/2015  CLINICAL DATA:  Status post left ventricular assist device placement. Cardiac arrhythmia. EXAM: PORTABLE CHEST 1 VIEW COMPARISON:  May 17, 2015 FINDINGS: Left ventricular assist device present. Pacemaker lead is attached to the right ventricle, stable. There is stable cardiomegaly with pulmonary vascularity within normal limits. Left central catheter tip is in the superior vena cava. There is a small left apical pneumothorax evident. There is atelectatic change in the left mid lung. There is consolidation in the left lower lobe with small left effusion. There is mild right base atelectasis, stable. Right lung is otherwise clear. No new opacity. No adenopathy evident. IMPRESSION: No appreciable change from 1 day prior. Cardiomegaly is  stable. Consolidation with small left effusion left lower lobe remains as does atelectasis in the left mid lung. Mild right base atelectasis is also stable. No new opacity. There is a subtle small left apical pneumothorax which is unchanged compared to 1 day prior without tension component. Electronically Signed: By: Lowella Grip III M.D. On: 05/18/2015 07:37     Medications:     Scheduled Medications: . antiseptic oral rinse  7 mL Mouth Rinse BID  . aspirin EC  81 mg Oral Daily  . bisacodyl  10 mg Oral Daily   Or  . bisacodyl  10 mg Rectal Daily  . cefTAZidime (FORTAZ)  IV  1 g Intravenous 3 times per day  . chlorpheniramine-HYDROcodone  5 mL Oral  QHS  . citalopram  20 mg Oral Daily  . digoxin  0.125 mg Oral Daily  . docusate sodium  200 mg Oral Daily  . feeding supplement  1 Container Oral TID BM  . ferrous Q000111Q C-folic acid  1 capsule Oral TID PC  . furosemide  80 mg Intravenous BID  . guaiFENesin  600 mg Oral BID  . losartan  25 mg Oral BID  . megestrol  20 mg Oral Daily  . pantoprazole  40 mg Oral Daily  . potassium chloride  40 mEq Oral BID  . sildenafil  20 mg Oral TID  . sodium chloride flush  10 mL Intravenous Q12H  . sodium chloride flush  3 mL Intravenous Q12H  . spironolactone  12.5 mg Oral Daily  . vancomycin  1,000 mg Intravenous Q12H  . Warfarin - Pharmacist Dosing Inpatient   Does not apply q1800    Infusions: . sodium chloride 10 mL/hr at 05/19/15 0400  . sodium chloride    . sodium chloride    . amiodarone    . milrinone 0.125 mcg/kg/min (05/19/15 0400)    PRN Medications: hydrALAZINE, ondansetron (ZOFRAN) IV, oxyCODONE, sodium chloride flush, sodium chloride flush, sodium chloride flush, traMADol, zolpidem   Assessment:  Left subclavian triple-lumen catheter placed 2-2 after attempt at PICC line was unsuccessful Recurrent atrial fibrillation with evidence of RV dysfunction on echo-treated with DC cardioversion and maintaining  sinus rhythm with clinical improvement overall  Postoperative jaundice slowly improving bilirubin   Surgical incisions clean and dry however since chest tubes had been in place a week and his white count is 16 ,000 Will cover with IV vancomycin and Zosyn   Plan/Discussion:   Continue diuresis with lasix  Advance diet, maximize protein intake , try megace to improve appetite  Follow bilirubin Continue with physical therapy after cardioversion back to sinus rhythm Continue with VAD education Careful monitoring and therapy for RV dysfunction include sildenafil and milrinone and maintenance of sinus rhythm. Doubt he'll need pericardial window for the small effusion.     I reviewed the LVAD parameters from today, and compared the results to the patient's prior recorded data.  No programming changes were made.  The LVAD is functioning within specified parameters.  The patient performs LVAD self-test daily.  LVAD interrogation was negative for any significant power changes, alarms or PI events/speed drops.  LVAD equipment check completed and is in good working order.  Back-up equipment present.   LVAD education done on emergency procedures and precautions and reviewed exit site care.  Length of Stay: [place] III 05/19/2015, 7:59 PM

## 2015-05-19 NOTE — Progress Notes (Signed)
Patient ID: Patrick Humphrey, male   DOB: 1966-03-13, 50 y.o.   MRN: [id number] HeartMate 2 Rounding Note  Subjective:    LVAD placement 05/10/15.   Extubated 2/1.  Got 2 units PRBCs 1/31. Hemoglobin 8.6>8.2>8.9>9>9.4>10.3> 9.8 .    05/18/15 S/P TEE and successful DC-CV. Left  ventricle was mildly dilated with EF 15% and septal akinesis. The LVAD inflow cannula was visualized at the apex. The RV with mildly dilated with severe systolic dysfunction. Pacemaker in RV. There was trivial TR. The aortic valve did not open. There was mild aortic insufficiency. Trivial TR. Mildly dilated LA with no LA appendage thrombus. Normal RA. Moderate pericardial effusion along the LV lateral wall, no tamponade. LVAD speed increased to 9200.   He is jaundiced, total bilirubin increased.  Pre-op 4.2 => post-op 9.8 => 8.6 => 7.4 => 6.2 => 6.3 => 5.2--> pending.   (elevated direct bilirubin as well).   Complaining of fatigue. Denies SOB    LVAD INTERROGATION:  HeartMate II LVAD:  Flow 4.6 liters/min, speed 9200,  power 5.1, PI 7.9 Rare PI events.    Objective:    Vital Signs:   Temp:  [98 F (36.7 C)-98.6 F (37 C)] 98.3 F (36.8 C) (02/09 0400) Pulse Rate:  [100-135] 102 (02/09 0600) Resp:  [14-29] 19 (02/09 0600) BP: (97-116)/(64-103) 107/91 mmHg (02/09 0600) SpO2:  [93 %-100 %] 95 % (02/09 0600) Weight:  [180 lb 8.9 oz (81.9 kg)] 180 lb 8.9 oz (81.9 kg) (02/09 0500) Last BM Date: 05/18/15 Mean arterial Pressure 90-100s   Intake/Output:   Intake/Output Summary (Last 24 hours) at 05/19/15 0706 Last data filed at 05/19/15 0600  Gross per 24 hour  Intake 1405.85 ml  Output   1575 ml  Net -169.15 ml     Physical Exam: General:  Fatigued appearing. Jaundice. No resp difficulty. In bed.   HEENT: normal Neck: supple. JVP ~10. Carotids 2+ bilat; no bruits. No lymphadenopathy or thryomegaly appreciated. L subclavian TLC Cor: Mechanical heart sounds with LVAD hum present. Lungs: Decreased  in the bases.  Abdomen: soft, nontender, nondistended. No hepatosplenomegaly. No bruits or masses. Good bowel sounds. Driveline: C/D/I; securement device intact and driveline incorporated Extremities: no cyanosis, clubbing, rash, R and LLE ted hose. 2+ edema. Red rash R and LLE 1/2 way up.  Neuro: alert & orientedx3, cranial nerves grossly intact. moves all 4 extremities w/o difficulty. Affect pleasant  Telemetry: A fib RVR 120s   Labs: Basic Metabolic Panel:  Recent Labs Lab 05/13/15 0430  05/14/15 0415 05/15/15 0405 05/16/15 0501 05/17/15 0510 05/18/15 0450 05/18/15 0527  NA 133*  < > 128* 127* 127* 126*  --  127*  K 4.0  < > 4.2 3.8 3.3* 3.3*  --  3.9  CL 103  < > 93* 93* 92* 91*  --  92*  CO2 22  --  24 25 24 24   --  24  GLUCOSE 79  < > 103* 91 78 95  --  110*  BUN 13  < > 15 16 12 12   --  12  CREATININE 0.82  < > 0.94 0.82 0.76 0.77  --  0.78  CALCIUM 7.9*  --  8.9 8.6* 8.0* 8.2*  --  8.1*  MG 1.6*  --  1.7 1.7 1.6* 1.9 2.0  --   PHOS 2.8  --   --   --   --   --   --   --   < > = values  in this interval not displayed.  Liver Function Tests:  Recent Labs Lab 05/14/15 0415 05/15/15 0405 05/16/15 0501 05/17/15 0510 05/18/15 0527  AST 68* 66* 57* 65* 57*  ALT 29 30 26 30 30   ALKPHOS 74 89 91 106 107  BILITOT 8.2* 7.4* 6.2* 6.3* 5.2*  PROT 5.4* 5.6* 5.3* 6.0* 5.9*  ALBUMIN 2.6* 3.0* 2.6* 2.8* 2.5*   No results for input(s): LIPASE, AMYLASE in the last 168 hours. No results for input(s): AMMONIA in the last 168 hours.  CBC:  Recent Labs Lab 05/13/15 0430  05/14/15 0415 05/15/15 0405 05/16/15 0501 05/17/15 0510 05/19/15 0455  WBC 8.3  --  9.0 7.5 9.6 12.2* 16.6*  NEUTROABS 6.2  --  6.4 4.9 6.5 8.6*  --   HGB 8.2*  < > 8.9* 8.8* 9.0* 9.4* 9.8*  HCT 23.9*  < > 25.7* 25.3* 25.5* 26.9* 29.5*  MCV 94.5  --  94.5 93.7 93.4 93.7 93.9  PLT 100*  --  136* 126* 178 291 393  < > = values in this interval not displayed.  INR:  Recent Labs Lab 05/15/15 0405  05/16/15 0501 05/17/15 0510 05/18/15 0527 05/19/15 0455  INR 2.54* 3.22* 3.18* 3.73* 2.98*    Other results:  EKG:   Imaging: Dg Chest Port 1 View  05/18/2015  ADDENDUM REPORT: 05/18/2015 07:50 ADDENDUM: Critical Value/emergent results were called by telephone at the time of interpretation on 05/18/2015 at 7:48 am to Dr. Marlyce Huge, who verbally acknowledged these results. Electronically Signed   By: Lowella Grip III M.D.   On: 05/18/2015 07:50  05/18/2015  CLINICAL DATA:  Status post left ventricular assist device placement. Cardiac arrhythmia. EXAM: PORTABLE CHEST 1 VIEW COMPARISON:  May 17, 2015 FINDINGS: Left ventricular assist device present. Pacemaker lead is attached to the right ventricle, stable. There is stable cardiomegaly with pulmonary vascularity within normal limits. Left central catheter tip is in the superior vena cava. There is a small left apical pneumothorax evident. There is atelectatic change in the left mid lung. There is consolidation in the left lower lobe with small left effusion. There is mild right base atelectasis, stable. Right lung is otherwise clear. No new opacity. No adenopathy evident. IMPRESSION: No appreciable change from 1 day prior. Cardiomegaly is stable. Consolidation with small left effusion left lower lobe remains as does atelectasis in the left mid lung. Mild right base atelectasis is also stable. No new opacity. There is a subtle small left apical pneumothorax which is unchanged compared to 1 day prior without tension component. Electronically Signed: By: Lowella Grip III M.D. On: 05/18/2015 07:37     Medications:     Scheduled Medications: . antiseptic oral rinse  7 mL Mouth Rinse BID  . aspirin EC  81 mg Oral Daily  . bisacodyl  10 mg Oral Daily   Or  . bisacodyl  10 mg Rectal Daily  . chlorpheniramine-HYDROcodone  5 mL Oral QHS  . citalopram  20 mg Oral Daily  . digoxin  0.125 mg Oral Daily  . docusate sodium  200 mg Oral  Daily  . feeding supplement  1 Container Oral TID BM  . ferrous Q000111Q C-folic acid  1 capsule Oral TID PC  . furosemide  60 mg Intravenous BID  . guaiFENesin  600 mg Oral BID  . megestrol  20 mg Oral Daily  . pantoprazole  40 mg Oral Daily  . potassium chloride  40 mEq Oral TID  . sildenafil  20 mg  Oral TID  . sodium chloride flush  10 mL Intravenous Q12H  . sodium chloride flush  3 mL Intravenous Q12H  . vancomycin  1,000 mg Intravenous Q12H  . Warfarin - Pharmacist Dosing Inpatient   Does not apply q1800    Infusions: . sodium chloride 10 mL/hr at 05/19/15 0400  . sodium chloride    . sodium chloride    . amiodarone 30 mg/hr (05/19/15 0400)  . milrinone 0.125 mcg/kg/min (05/19/15 0400)    PRN Medications: hydrALAZINE, ondansetron (ZOFRAN) IV, oxyCODONE, sodium chloride flush, sodium chloride flush, sodium chloride flush, traMADol, zolpidem   Assessment:   1. Nonischemic cardiomyopathy: Long-standing, EF 20-25%.  Coronary angiography this admission with no significant CAD. Cardiogenic shock requiring milrinone and norepinephrine, IABP placed. Heartmate II LVAD placed 05/10/15.  2. Post-op blood loss anemia: 2 units PRBCs 1/31.  3. Elevate bilirubin: Noted pre-op, thought due to RV failure.  Higher post-op with jaundice.  Elevated direct bilirubin, so liver source rather than hemolysis.  4. Atrial flutter: Resolved on amiodarone.  5. Hyponatremia 6. Atrial fibrillation with RVR 2/7.  TEE-guided DCCV 05/18/15.  7. RV failure: Revatio begun 2/7.  8. Small left apical PTX.   Plan/Discussion:     ECHO with severe RV dysfunction and mod pericardial effusion. Continue sildenafil 20 mg tid and milrinone 0.125 mcg.   S/P TEE successful DC-CV. Based on TEE, speed increased to 9200.  Maintaining Sinus Tach. Continue amio drip.   Today's co-ox is 58.6%.  Continue milrinone 0.125 mcg, would not increase right now with RVR.   Sluggish urine output despite increased  lasix. Increase Lasix to 80  mg IV bid. Add 12.5 mg spiro daily. Losartan 25 mg daily. Cut back potassium 40 meq twice a day. Renal function ok yesterday but pending today. Set up CVP.    INR today 2.98, hemoglobin stable.  Holding coumadin today. Continue ASA 81 mg daily.      WBCs uptrending, afebrile.  Follow closely.   CMET pending.---> Total bilirubin trending down.  Some elevation pre-op thought due to CHF, more marked post-op. Normal alkaline phosphatase.  LDH up a little 249>290. Elevated direct bilirubin suggests liver source.   I reviewed the LVAD parameters from today, and compared the results to the patient's prior recorded data.  No programming changes were made.  The LVAD is functioning within specified parameters.  The patient performs LVAD self-test daily.  LVAD interrogation was negative for any significant power changes, alarms or PI events/speed drops.  LVAD equipment check completed and is in good working order.  Back-up equipment present.   LVAD education done on emergency procedures and precautions and reviewed exit site care.  Length of Stay: [place]  NP-C  05/19/2015, 7:06 AM  VAD Team --- VAD ISSUES ONLY--- Pager [phone] (7am - 7am)  Advanced Heart Failure Team  Pager [phone] (M-F; 7a - 4p)  Please contact [facility] for night-coverage after hours (4p -7a ) and weekends on [email or web address]  Patient seen with NP, agree with the above note.  Doing better today.  Co-ox 59% on milrinone 0.125.  He remains in NSR after DCCV yesterday.   BMET not back yet today.   Still with volume overload.  - Increase Lasix to 80 mg IV bid and will start spironolactone 12.5 daily.   MAP elevated, adding spironolactone 12.5 daily and losartan 25 mg daily.   Continue milrinone 0.125 and Revatio 20 mg tid with RV failure.  Speed appeared optimized yesterday by  TEE at 9200 rpm.   WBCs rising but afebrile, follow closely.   Needs to walk today.   [name] 05/19/2015 7:33  AM

## 2015-05-19 NOTE — Progress Notes (Signed)
Physical Therapy Treatment Patient Details Name: Patrick Humphrey MRN: [id number] DOB: Jan 20, 1966 Today's Date: 05/19/2015    History of Present Illness patient is a 50 yo male with nonischemic cardiomyopathy since 2002 with acute on chronic systolic heart failure and cardiogenic shock, deteriorating on 2 inotropes and had IABP placed. Moderate RV systolic dysfunction on preop echo.  s/p HeartMate II LVAD    PT Comments    Patient progressing slowly towards PT goals. Requires Min A for standing and Min guard for ambulation. Fatigues easily today requiring longer seated rest break.   Continued LVAD education with wife present. Had patient instruct wife on how to change monitor to battery, donn vest and pack black bag (along with contents in black bag) with Min A and cues.   Encouraged OOB to chair for all meals. Will follow acutely per current POC.   Follow Up Recommendations  Home health PT;Supervision/Assistance - 24 hour     Equipment Recommendations  Other (comment) (rollator)    Recommendations for Other Services       Precautions / Restrictions Precautions Precautions: Sternal;Fall Precaution Comments: sternal,LVAD precautions- black bag at all times Restrictions Weight Bearing Restrictions: No    Mobility  Bed Mobility Overal bed mobility: Needs Assistance Bed Mobility: Supine to Sit     Supine to sit: Min assist;+2 for safety/equipment;HOB elevated     General bed mobility comments: Min A for trunk elevation. Pt holding pillow to adhere to sternal precautions.  Transfers Overall transfer level: Needs assistance Equipment used: Pushed w/c Transfers: Sit to/from Stand Sit to Stand: Min assist;+2 safety/equipment         General transfer comment: Min A to boost to standing from EOB x1, from chair x1 holding heart pillow.   Ambulation/Gait Ambulation/Gait assistance: Min guard Ambulation Distance (Feet): 100 Feet (x2 bouts) Assistive device:  (pushed  w/c) Gait Pattern/deviations: Step-through pattern;Decreased stride length;Trunk flexed Gait velocity: decreased Gait velocity interpretation: Below normal speed for age/gender General Gait Details: Slow, guarded gait with cues for upright posture. HR 108-118 bpm.  1 long seated rest break.    Stairs            Wheelchair Mobility    Modified Rankin (Stroke Patients Only)       Balance Overall balance assessment: Needs assistance Sitting-balance support: Feet supported;No upper extremity supported Sitting balance-Leahy Scale: Good     Standing balance support: During functional activity Standing balance-Leahy Scale: Fair                      Cognition Arousal/Alertness: Awake/alert Behavior During Therapy: Flat affect Overall Cognitive Status: Within Functional Limits for tasks assessed (slower processing)                      Exercises      General Comments General comments (skin integrity, edema, etc.): Continued LVAD education with wife present. Had patient instruct wife on how to change monitor to battery, donn vest and pack black bag.       Pertinent Vitals/Pain Pain Assessment: Faces Faces Pain Scale: Hurts a little bit Pain Location: sternum Pain Descriptors / Indicators: Guarding;Operative site guarding Pain Intervention(s): Monitored during session;Repositioned;Limited activity within patient's tolerance    Home Living                      Prior Function            PT Goals (current goals can now be  found in the care plan section) Acute Rehab PT Goals Patient Stated Goal: to go home Progress towards PT goals: Progressing toward goals (slowly)    Frequency  Min 4X/week    PT Plan Current plan remains appropriate    Co-evaluation PT/OT/SLP Co-Evaluation/Treatment: Yes Reason for Co-Treatment: Complexity of the patient's impairments (multi-system involvement);For patient/therapist safety PT goals addressed during  session: Mobility/safety with mobility;Strengthening/ROM OT goals addressed during session: Strengthening/ROM     End of Session   Activity Tolerance: Patient limited by fatigue Patient left: in bed;with nursing/sitter in room;with family/visitor present (sitting EOB with OT and RN present in room)     Time: [id number] PT Time Calculation (min) (ACUTE ONLY): 38 min  Charges:  $Gait Training: 8-22 mins $Therapeutic Activity: 8-22 mins                    G Codes:      Deronda Christian A Ronda Kazmi 05/19/2015, 2:27 PM [name], [place], DPT [phone]

## 2015-05-19 NOTE — Progress Notes (Signed)
Admitted 04/29/15 due to acute on chronic systolic CHF.   HeartMate II LVAD implated on 05/10/15 by Dr. Darcey Nora for DT with hope for bridge to candidacy.   Vital signs: Temp:  97.8 HR: 114 - 132 Doppler Pressure: 98 - 100 Automatic BP: 100 - 124 / 90s  MAP: 98 - 104 O2 Sat:  95 - 99%  Wt in lbs:  160 > ... > 181 > 180 > 180    LVAD interrogation reveals:  Speed:  8970 Flow:  4.8 Power:  5.0 PI:  7.7 Alarms: none Events: No PI events  Fixed speed:  9000 Low speed limit: 8400  Drive Line: Dressing C/D/I  Telemetry:  HR 100 - 110s ST   Labs:  LDH trend:  247 > 282 > 276  > ... > 253 > 249 > 290  INR trend:  1.57 > 1.53 > 1.33 > ... > 3.18 > 3.73 > 2.98  Hgb:  11.2 > ... > 9.0 > 9.4 > 9.8  Blood products: Intra op:  6 units FFP Post Op:   05/10/15 ---> 2 units PC, 1 unit plts  Gtts: Milrinone 0.375 ---> 0.25 on 05/12/15 Amiodarone --->  started 05/13/15 for A flutter Levophed ---> off 05/11/15 Epi ---> off 05/12/15   NO:  Weaned off 05/12/15   PICC line: placed 05/12/15  Arrhythmia Management: 05/13/15--> IV Amiodarone bolus 05/17/15--> IV Amiodarone bolus   Cardioversion on 05/17/15 for AFib/Flutter   Plan/Recommendations:  1. D/C Teaching scheduled Tuesday at 10:00.   2. Home equipment will be delivered either Friday or Monday after it is checked in.  3. Patient requesting to try an alternative to the holster--will bring up a vest to trial while inpatient for comfort.   Janene Madeira, RN VAD Coordinator   Office: [phone] 24/7 VAD Pager: [phone]

## 2015-05-19 NOTE — Progress Notes (Signed)
Dr. Prescott Gum called regarding low flows.  Down to 2.1 - 3.1, CVP from 18 to 10, and MAP's down to 54 -66.  Orders received.  I will cont to monitor.

## 2015-05-19 NOTE — Progress Notes (Signed)
Occupational Therapy Treatment Patient Details Name: Patrick Humphrey MRN: [id number] DOB: 1966/03/21 Today's Date: 05/19/2015    History of present illness patient is a 50 yo male with nonischemic cardiomyopathy since 2002 with acute on chronic systolic heart failure and cardiogenic shock, deteriorating on 2 inotropes and had IABP placed. Moderate RV systolic dysfunction on preop echo.  s/p HeartMate II LVAD   OT comments  Pt ambulating pushing w/c on RA with min assist and second person for safety and chair following and long rest break between bouts. Pt able to verbalize to wife how to switch power source, verbal cues to bring black bag. Max assist to manage vest and bathrobe and to don slip on shoes. Good adherence to sternal precautions. Continue OT.  Follow Up Recommendations  Home health OT;Supervision/Assistance - 24 hour    Equipment Recommendations  None recommended by OT    Recommendations for Other Services      Precautions / Restrictions Precautions Precautions: Sternal;Fall Precaution Comments: sternal,LVAD precautions- black bag at all times Restrictions Weight Bearing Restrictions: No       Mobility Bed Mobility Overal bed mobility: Needs Assistance Bed Mobility: Supine to Sit     Supine to sit: Min assist;+2 for safety/equipment     General bed mobility comments: HOB up, hold pillow  Transfers Overall transfer level: Needs assistance Equipment used: Pushed w/c Transfers: Sit to/from Stand Sit to Stand: Min assist;+2 safety/equipment         General transfer comment: stood from bed and chair    Balance                                   ADL Overall ADL's : Needs assistance/impaired                 Upper Body Dressing : Maximal assistance;Sitting Upper Body Dressing Details (indicate cue type and reason): robe and LVAD vest Lower Body Dressing: Maximal assistance;Sitting/lateral leans Lower Body Dressing Details (indicate  cue type and reason): slip on shoes at EOB             Functional mobility during ADLs: Minimal assistance;+2 for safety/equipment;Wheelchair General ADL Comments: Pt able to verbalize to wife how to switch power sources to and from batteries. Verbal cues to recall black bag.      Vision                     Perception     Praxis      Cognition   Behavior During Therapy: Flat affect Overall Cognitive Status: Within Functional Limits for tasks assessed (somewhat slower processing speed)                       Extremity/Trunk Assessment               Exercises     Shoulder Instructions       General Comments      Pertinent Vitals/ Pain       Pain Assessment: Faces Faces Pain Scale: Hurts little more Pain Location: sternum Pain Descriptors / Indicators: Guarding;Operative site guarding Pain Intervention(s): Limited activity within patient's tolerance;Monitored during session;Repositioned  Home Living  Prior Functioning/Environment              Frequency Min 2X/week     Progress Toward Goals  OT Goals(current goals can now be found in the care plan section)  Progress towards OT goals: Not progressing toward goals - comment (Pt with 2 days of inactivity due to tachycardia.)  Acute Rehab OT Goals Patient Stated Goal: to go home  Plan Discharge plan remains appropriate    Co-evaluation    PT/OT/SLP Co-Evaluation/Treatment: Yes Reason for Co-Treatment: For patient/therapist safety;Complexity of the patient's impairments (multi-system involvement)   OT goals addressed during session: Strengthening/ROM      End of Session     Activity Tolerance Patient limited by fatigue   Patient Left in chair;with call bell/phone within reach;with family/visitor present;with nursing/sitter in room   Nurse Communication          Time: [id number] OT Time Calculation (min): 48  min  Charges: OT General Charges $OT Visit: 1 Procedure OT Treatments $Therapeutic Activity: 8-22 mins  Malka So 05/19/2015, 1:12 PM [phone]

## 2015-05-19 NOTE — Progress Notes (Signed)
CSW met with patient at bedside. Patient reports he was able to ambulate today around the unit but noted still feeling fatigued. Patient also spoke about his "go get'm" personality and feeling slow with his recovery and a few set backs. Patient states he is trying to remain positive and tackle his recovery. CSW offered support and encouragement. CSW will continue to follow throughout recovery. Raquel Sarna, [place]

## 2015-05-19 NOTE — Progress Notes (Addendum)
ANTICOAGULATION CONSULT NOTE   Pharmacy Consult for Warfarin Indication: post LVAD  Vital Signs: Temp: 97.8 F (36.6 C) (02/09 0700) Temp Source: Oral (02/09 0700) BP: 117/100 mmHg (02/09 0700) Pulse Rate: 111 (02/09 0700)  Labs:  Recent Labs  05/17/15 0510 05/18/15 0527 05/19/15 0455 05/19/15 0730  HGB 9.4*  --  9.8*  --   HCT 26.9*  --  29.5*  --   PLT 291  --  393  --   LABPROT 32.0* 36.0* 30.5*  --   INR 3.18* 3.73* 2.98*  --   CREATININE 0.77 0.78  --  0.84    Estimated Creatinine Clearance: 112.1 mL/min (by C-G formula based on Cr of 0.84).   Assessment: 49yom with HF s/p LVAD placed 05/10/15.  Warfarin started for anticoagulation 05/12/15 with ASA 81mg  daily.  INR jumped quickly to 3.22 after 3 doses, looked to be stabilizing but then bumped again to 3.7 2/8, dose held now INR down to 2.98 this am. Cbc this am is stable. LDH 290 slight trend up. Will restart low dose warfarin tonight.  Cardioverted yesterday, continues on amio gtt, reduce to 30mg /hr  ID: no fevers overnight, wbc continues to trend up to 16. Started empiric vancomycin on 2/7 with plans to continue until tubes are out. Scr stable will plan to check trough tomorrow morning.  Goal of Therapy:  INR 2-2.5 Monitor platelets by anticoagulation protocol: Yes   Plan:  Warfarin 1mg  tonight Daily INR for now Monitor s/s bleeding Vancomycin trough in am  Erin Hearing PharmD., BCPS Clinical Pharmacist Pager [phone] 05/19/2015 8:30 AM   Addendum:  Tressie Ellis added this am to broaden coverage. 1g q8 dose appropriate. Noted possible PCN allergy, d/w patient and he has tolerated some related abx to PCN in past was kept in observation in doctors office with no reaction. Did note some kind of reaction to pcn as a child. Will watch closely and discussed with patient to notify us for any changes.  05/19/2015 9:07 AM

## 2015-05-20 ENCOUNTER — Inpatient Hospital Stay (HOSPITAL_COMMUNITY): Payer: Medicare Other

## 2015-05-20 LAB — CBC
HCT: 27.8 % — ABNORMAL LOW (ref 39.0–52.0)
Hemoglobin: 9.4 g/dL — ABNORMAL LOW (ref 13.0–17.0)
MCH: 31.8 pg (ref 26.0–34.0)
MCHC: 33.8 g/dL (ref 30.0–36.0)
MCV: 93.9 fL (ref 78.0–100.0)
Platelets: 374 10*3/uL (ref 150–400)
RBC: 2.96 MIL/uL — ABNORMAL LOW (ref 4.22–5.81)
RDW: 18 % — ABNORMAL HIGH (ref 11.5–15.5)
WBC: 16 10*3/uL — ABNORMAL HIGH (ref 4.0–10.5)

## 2015-05-20 LAB — DIGOXIN LEVEL: Digoxin Level: 0.5 ng/mL — ABNORMAL LOW (ref 0.8–2.0)

## 2015-05-20 LAB — COMPREHENSIVE METABOLIC PANEL
ALT: 24 U/L (ref 17–63)
AST: 53 U/L — ABNORMAL HIGH (ref 15–41)
Albumin: 2.4 g/dL — ABNORMAL LOW (ref 3.5–5.0)
Alkaline Phosphatase: 102 U/L (ref 38–126)
Anion gap: 8 (ref 5–15)
BUN: 10 mg/dL (ref 6–20)
CO2: 28 mmol/L (ref 22–32)
Calcium: 8.3 mg/dL — ABNORMAL LOW (ref 8.9–10.3)
Chloride: 90 mmol/L — ABNORMAL LOW (ref 101–111)
Creatinine, Ser: 0.73 mg/dL (ref 0.61–1.24)
GFR calc Af Amer: >60 mL/min (ref >60)
GFR calc non Af Amer: >60 mL/min (ref >60)
Glucose, Bld: 91 mg/dL (ref 65–99)
Potassium: 3.4 mmol/L — ABNORMAL LOW (ref 3.5–5.1)
Sodium: 126 mmol/L — ABNORMAL LOW (ref 135–145)
Total Bilirubin: 4.3 mg/dL — ABNORMAL HIGH (ref 0.3–1.2)
Total Protein: 5.6 g/dL — ABNORMAL LOW (ref 6.5–8.1)

## 2015-05-20 LAB — VANCOMYCIN, TROUGH: Vancomycin Tr: 17 ug/mL (ref 10.0–20.0)

## 2015-05-20 LAB — CARBOXYHEMOGLOBIN
Carboxyhemoglobin: 2.7 % — ABNORMAL HIGH (ref 0.5–1.5)
Methemoglobin: 0.9 % (ref 0.0–1.5)
O2 Saturation: 63.9 %
Total hemoglobin: 9 g/dL — ABNORMAL LOW (ref 13.5–18.0)

## 2015-05-20 LAB — LACTATE DEHYDROGENASE: LDH: 224 U/L — ABNORMAL HIGH (ref 98–192)

## 2015-05-20 LAB — PROTIME-INR
INR: 2.71 — ABNORMAL HIGH (ref 0.00–1.49)
Prothrombin Time: 28.4 seconds — ABNORMAL HIGH (ref 11.6–15.2)

## 2015-05-20 MED ORDER — WARFARIN SODIUM 1 MG PO TABS
1.0000 mg | ORAL_TABLET | Freq: Once | ORAL | Status: AC
  Administered 2015-05-20: 1 mg via ORAL
  Filled 2015-05-20 (×2): qty 1

## 2015-05-20 MED ORDER — SODIUM CHLORIDE 0.9 % IV BOLUS (SEPSIS)
250.0000 mL | Freq: Once | INTRAVENOUS | Status: AC
Start: 2015-05-20 — End: 2015-05-20

## 2015-05-20 MED ORDER — POTASSIUM CHLORIDE CRYS ER 20 MEQ PO TBCR
40.0000 meq | EXTENDED_RELEASE_TABLET | Freq: Two times a day (BID) | ORAL | Status: AC
  Administered 2015-05-20 (×2): 40 meq via ORAL
  Filled 2015-05-20 (×2): qty 2

## 2015-05-20 MED ORDER — ALBUMIN HUMAN 25 % IV SOLN
12.5000 g | Freq: Once | INTRAVENOUS | Status: AC
  Administered 2015-05-20: 12.5 g via INTRAVENOUS
  Filled 2015-05-20: qty 50

## 2015-05-20 MED ORDER — POTASSIUM CHLORIDE CRYS ER 20 MEQ PO TBCR: 20.0000 meq | EXTENDED_RELEASE_TABLET | Freq: Once | ORAL | Status: DC

## 2015-05-20 MED ORDER — SPIRONOLACTONE 25 MG PO TABS: 25.0000 mg | ORAL_TABLET | Freq: Every day | ORAL | Status: DC

## 2015-05-20 MED ORDER — FUROSEMIDE 40 MG PO TABS: 40.0000 mg | ORAL_TABLET | Freq: Two times a day (BID) | ORAL | Status: DC

## 2015-05-20 NOTE — Progress Notes (Signed)
Called from RN reporting MAPs 53 - low 60s after losartan. PI currently ~3 where he was >5 before losartan this AM; HR 98. Treated with albumin overnight for low PI and MAPs. Questioning medications and which to hold today--D/W Darrick Grinder, NP and will have her hold lasix and spiro for now. Advised to give Sildenafil with concerns over RVF.   Janene Madeira, RN VAD Coordinator   Office: [phone] 24/7 VAD Pager: [phone]

## 2015-05-20 NOTE — Progress Notes (Signed)
Patient ID: Patrick Humphrey, male   DOB: 07/01/1965, 50 y.o.   MRN: [id number] HeartMate 2 Rounding Note  Subjective:    LVAD placement 05/10/15.   Extubated 2/1.  Got 2 units PRBCs 1/31. Today's Hgb 9.4   .    05/18/15 S/P TEE and successful DC-CV. Left  ventricle was mildly dilated with EF 15% and septal akinesis. The LVAD inflow cannula was visualized at the apex. The RV with mildly dilated with severe systolic dysfunction. Pacemaker in RV. There was trivial TR. The aortic valve did not open. There was mild aortic insufficiency. Trivial TR. Mildly dilated LA with no LA appendage thrombus. Normal RA. Moderate pericardial effusion along the LV lateral wall, no tamponade. LVAD speed increased to 9200.   He is jaundiced, total bilirubin increased.  Pre-op 4.2 => post-op 9.8 => 8.6 => 7.4 => 6.2 => 6.3 => 5.2--> 5.7 --> 4.3 .   (elevated direct bilirubin as well).   Yesterday IV lasix increased, spiro and losartan added. Brisk diuresis noted. Weight down 6 pounds.  Maps and PI dropped. Received albumin last night.   Overall feeling much better. Denies pain. Denies SOB.    LVAD INTERROGATION:  HeartMate II LVAD:  Flow 4.2 liters/min, speed 9200,  power 5.1, PI 5.6  9 PI events     Objective:    Vital Signs:   Temp:  [97.6 F (36.4 C)-97.9 F (36.6 C)] 97.8 F (36.6 C) (02/10 0000) Pulse Rate:  [80-113] 87 (02/10 0600) Resp:  [16-31] 23 (02/10 0600) BP: (68-124)/(44-102) 94/82 mmHg (02/10 0600) SpO2:  [93 %-100 %] 95 % (02/10 0600) Weight:  [174 lb 13.2 oz (79.3 kg)] 174 lb 13.2 oz (79.3 kg) (02/10 0500) Last BM Date: 05/18/15 Mean arterial Pressure 80s    Intake/Output:   Intake/Output Summary (Last 24 hours) at 05/20/15 0706 Last data filed at 05/20/15 0600  Gross per 24 hour  Intake 1723.4 ml  Output   4400 ml  Net -2676.6 ml     Physical Exam: CVP 9  General:  NAD. Jaundice. No resp difficulty. In bed.   HEENT: normal Neck: supple. JVP 8-9. Carotids 2+ bilat;  no bruits. No lymphadenopathy or thryomegaly appreciated. L subclavian TLC Cor: Mechanical heart sounds with LVAD hum present. Lungs: Decreased in the bases.  Abdomen: soft, nontender, nondistended. No hepatosplenomegaly. No bruits or masses. Good bowel sounds. Driveline: C/D/I; securement device intact and driveline incorporated Extremities: no cyanosis, clubbing, rash, R and LLE ted hose. R and L ankle 1+  edema. Red rash R and LLE 1/2 way up.  Neuro: alert & orientedx3, cranial nerves grossly intact. moves all 4 extremities w/o difficulty. Affect pleasant  Telemetry: SR 90s    Labs: Basic Metabolic Panel:  Recent Labs Lab 05/14/15 0415 05/15/15 0405 05/16/15 0501 05/17/15 0510 05/18/15 0450 05/18/15 0527 05/19/15 0730  NA 128* 127* 127* 126*  --  127* 124*  K 4.2 3.8 3.3* 3.3*  --  3.9 3.8  CL 93* 93* 92* 91*  --  92* 90*  CO2 24 25 24 24   --  24 24  GLUCOSE 103* 91 78 95  --  110* 100*  BUN 15 16 12 12   --  12 11  CREATININE 0.94 0.82 0.76 0.77  --  0.78 0.84  CALCIUM 8.9 8.6* 8.0* 8.2*  --  8.1* 7.9*  MG 1.7 1.7 1.6* 1.9 2.0  --   --     Liver Function Tests:  Recent Labs Lab 05/15/15 0405  05/16/15 0501 05/17/15 0510 05/18/15 0527 05/19/15 0730  AST 66* 57* 65* 57* 59*  ALT 30 26 30 30 29   ALKPHOS 89 91 106 107 110  BILITOT 7.4* 6.2* 6.3* 5.2* 5.5*  PROT 5.6* 5.3* 6.0* 5.9* 5.7*  ALBUMIN 3.0* 2.6* 2.8* 2.5* 2.4*   No results for input(s): LIPASE, AMYLASE in the last 168 hours. No results for input(s): AMMONIA in the last 168 hours.  CBC:  Recent Labs Lab 05/14/15 0415 05/15/15 0405 05/16/15 0501 05/17/15 0510 05/19/15 0455 05/20/15 0620  WBC 9.0 7.5 9.6 12.2* 16.6* 16.0*  NEUTROABS 6.4 4.9 6.5 8.6*  --   --   HGB 8.9* 8.8* 9.0* 9.4* 9.8* 9.4*  HCT 25.7* 25.3* 25.5* 26.9* 29.5* 27.8*  MCV 94.5 93.7 93.4 93.7 93.9 93.9  PLT 136* 126* 178 291 393 374    INR:  Recent Labs Lab 05/16/15 0501 05/17/15 0510 05/18/15 0527 05/19/15 0455  05/20/15 0620  INR 3.22* 3.18* 3.73* 2.98* 2.71*    Other results:  EKG:   Imaging: Dg Chest Port 1 View  05/19/2015  CLINICAL DATA:  Chest tube removal prior to physical therapy EXAM: PORTABLE CHEST 1 VIEW COMPARISON:  05/18/2015 FINDINGS: Heart is enlarged. Status post median sternotomy. Left ventricular assist device. Patient has right-sided transvenous pacemaker/ AICD with lead to the right ventricle. Patient has a left-sided central line, tip to the superior vena cava. Left pneumothorax is smaller, estimated be approximately 5% of left lung volume. There is opacity of both lung bases, similar in appearance to prior study. IMPRESSION: 1. Smaller left pneumothorax. 2. Persistent bibasilar opacity, left greater than right. Electronically Signed   By: Nolon Nations M.D.   On: 05/19/2015 10:47     Medications:     Scheduled Medications: . antiseptic oral rinse  7 mL Mouth Rinse BID  . aspirin EC  81 mg Oral Daily  . bisacodyl  10 mg Oral Daily   Or  . bisacodyl  10 mg Rectal Daily  . cefTAZidime (FORTAZ)  IV  1 g Intravenous 3 times per day  . chlorpheniramine-HYDROcodone  5 mL Oral QHS  . citalopram  20 mg Oral Daily  . digoxin  0.125 mg Oral Daily  . docusate sodium  200 mg Oral Daily  . feeding supplement  1 Container Oral TID BM  . ferrous [id number] C-folic acid  1 capsule Oral TID PC  . furosemide  80 mg Intravenous BID  . guaiFENesin  600 mg Oral BID  . losartan  25 mg Oral BID  . megestrol  20 mg Oral Daily  . pantoprazole  40 mg Oral Daily  . potassium chloride  40 mEq Oral BID  . sildenafil  20 mg Oral TID  . sodium chloride flush  10 mL Intravenous Q12H  . sodium chloride flush  3 mL Intravenous Q12H  . spironolactone  12.5 mg Oral Daily  . vancomycin  1,000 mg Intravenous Q12H  . Warfarin - Pharmacist Dosing Inpatient   Does not apply q1800    Infusions: . sodium chloride 10 mL/hr at 05/20/15 0400  . sodium chloride    . sodium chloride    .  amiodarone 30 mg/hr (05/20/15 0400)  . milrinone 0.125 mcg/kg/min (05/20/15 0400)    PRN Medications: albumin human, hydrALAZINE, ondansetron (ZOFRAN) IV, oxyCODONE, sodium chloride flush, sodium chloride flush, sodium chloride flush, traMADol, zolpidem   Assessment:   1. Nonischemic cardiomyopathy: Long-standing, EF 20-25%.  Coronary angiography this admission with no significant CAD. Cardiogenic  shock requiring milrinone and norepinephrine, IABP placed. Heartmate II LVAD placed 05/10/15.  2. Post-op blood loss anemia: 2 units PRBCs 1/31.  3. Elevate bilirubin: Noted pre-op, thought due to RV failure.  Higher post-op with jaundice.  Elevated direct bilirubin, so liver source rather than hemolysis.  4. Atrial flutter: Resolved on amiodarone.  5. Hyponatremia 6. Atrial fibrillation with RVR 2/7.  TEE-guided DCCV 05/18/15.  7. RV failure: Revatio begun 2/7.  8. Small left apical PTX.  9. Elevated WBCs: Empiric vancomycin  Plan/Discussion:    ECHO with severe RV dysfunction and mod pericardial effusion. Continue sildenafil 20 mg tid and milrinone 0.125 mcg/kg/min, co-ox 64%.    S/P TEE successful DC-CV. Based on TEE, speed increased to 9200.  HR now in 90s, remains on amiodarone.    Brisk diuresis. Weight down 6 pounds. CVP 9. Stop IV lasix. Start lasix 40 mg po bid. Increase spironolactone to 25 mg daily. Losartan 25 mg bid. Continue potassium 40 meq twice a day. Renal function ok.    INR today 2.71, hemoglobin stable.  Continue ASA 81 mg daily.      WBCs 16. Afebrile. Empiric vancomycin.    Total bilirubin 4.3.  Elevation pre-op thought due to CHF/RV failure, more marked post-op. Normal alkaline phosphatase.  LDH stable 249>290>224. Elevated direct bilirubin suggests liver source.   I reviewed the LVAD parameters from today, and compared the results to the patient's prior recorded data.  No programming changes were made.  The LVAD is functioning within specified parameters.  The patient  performs LVAD self-test daily.  LVAD interrogation was negative for any significant power changes, alarms or PI events/speed drops.  LVAD equipment check completed and is in good working order.  Back-up equipment present.   LVAD education done on emergency procedures and precautions and reviewed exit site care.  Length of Stay: [place]  NP-C  05/20/2015, 7:06 AM  VAD Team --- VAD ISSUES ONLY--- Pager [phone] (7am - 7am)  Advanced Heart Failure Team  Pager [phone] (M-F; 7a - 4p)  Please contact [facility] for night-coverage after hours (4p -7a ) and weekends on [email or web address]  Patient seen with NP, agree with the above note.  He is doing better in NSR.  HR in 90s, co-ox 64%, CVP 9 with good diuresis yesterday.   - Transition to po Lasix.  - MAP controlled.  - Continue milrinone 0.125 today, would like to try to titrate off again over the next few days but will move slowly.   - Continue Revatio 20 mg tid.  - Amiodarone infusion today, to po tomorrow.   Needs to walk in halls today.   [name] 05/20/2015 7:41 AM

## 2015-05-20 NOTE — Progress Notes (Signed)
Physical Therapy Treatment Patient Details Name: Patrick Humphrey MRN: [id number] DOB: 1966/01/31 Today's Date: 05/20/2015    History of Present Illness patient is a 50 yo male with nonischemic cardiomyopathy since 2002 with acute on chronic systolic heart failure and cardiogenic shock, deteriorating on 2 inotropes and had IABP placed. Moderate RV systolic dysfunction on preop echo.  s/p HeartMate II LVAD    PT Comments    Patient having a good day today. Reports feeling more energized and lighter since getting all that fluid off yesterday. Improved ambulation distance to 300' pushing w/c without seated rest break. HR stable. Pt BP soft but asymptomatic.   Continued LVAD education as pt has new vest. Min A to donn vest and place batteries in velcro. Pt able to switch monitor to/from battery and state contents of black bag with Min A and cues.  Will continue to follow per current POC.    Follow Up Recommendations  Home health PT;Supervision/Assistance - 24 hour     Equipment Recommendations  Other (comment) (rollator)    Recommendations for Other Services       Precautions / Restrictions Precautions Precautions: Sternal;Fall Precaution Comments: sternal,LVAD precautions- black bag at all times Restrictions Weight Bearing Restrictions: Yes    Mobility  Bed Mobility Overal bed mobility: Needs Assistance Bed Mobility: Sit to Sidelying;Rolling Rolling: Min guard     Sit to supine: Mod assist;+2 for physical assistance   General bed mobility comments: Assist to bring LEs into bed and lower trunk into sidelying; holding pilow for sternal precautions.   Transfers Overall transfer level: Needs assistance Equipment used: Pushed w/c Transfers: Sit to/from [facility] Sit to Stand: Min assist;+2 safety/equipment Stand pivot transfers: Min assist       General transfer comment: Min A to boost to standing from chair x2 holding heart pillow.  SPT chair to bed  Min A for balance/lines.  Ambulation/Gait Ambulation/Gait assistance: Min guard Ambulation Distance (Feet): 300 Feet Assistive device:  (pushed w/c) Gait Pattern/deviations: Step-through pattern;Decreased stride length;Trunk flexed Gait velocity: decreased   General Gait Details: Slow, guarded gait with cues for upright posture. HR 100-112 bpm. 2 standing rest breaks.   Stairs            Wheelchair Mobility    Modified Rankin (Stroke Patients Only)       Balance Overall balance assessment: Needs assistance Sitting-balance support: Feet supported;No upper extremity supported Sitting balance-Leahy Scale: Good     Standing balance support: During functional activity Standing balance-Leahy Scale: Fair                      Cognition Arousal/Alertness: Awake/alert Behavior During Therapy: WFL for tasks assessed/performed Overall Cognitive Status: Within Functional Limits for tasks assessed                      Exercises      General Comments        Pertinent Vitals/Pain Pain Assessment: Faces Faces Pain Scale: Hurts a little bit Pain Location: sternum Pain Descriptors / Indicators: Guarding;Operative site guarding Pain Intervention(s): Monitored during session;Repositioned    Home Living                      Prior Function            PT Goals (current goals can now be found in the care plan section) Progress towards PT goals: Progressing toward goals    Frequency  Min  4X/week    PT Plan Current plan remains appropriate    Co-evaluation             End of Session   Activity Tolerance: Patient tolerated treatment well Patient left: in bed;with call bell/phone within reach;with nursing/sitter in room     Time: [id number] PT Time Calculation (min) (ACUTE ONLY): 43 min  Charges:  $Gait Training: 23-37 mins $Therapeutic Activity: 8-22 mins                    G Codes:      Patrick Humphrey 05/20/2015, 3:04  PM Patrick Humphrey, [place], DPT [phone]

## 2015-05-20 NOTE — Progress Notes (Signed)
Patrick Humphrey Rounding Note     Postop day #10 Patrick Humphrey LVAD implantation   Subjective:   History of nonischemic cardiomyopathy, ejection fraction 15% Preoperative cardiogenic shock on balloon pump and dual inotropes Preoperative protein deficiency malnutrition with prealbumin 8.4 Preoperative moderate RV dysfunction Preoperative hepatic insufficiency with bilirubin of 4.Humphrey Status post AICD implantation 2002  Patient has maintained sinus rhythm after cardioversion yesterday and feels better-better strength, better appetite  able to stand and walk in the hallway.  His VAD parameters remain excellent and he is diuresing well.  Last mediastinal drain ( pocket drain) was removed today.  He has postoperative liver insufficiency with jaundice, bilirubin now down to 4  He has postoperative elevation in white count on empiric antibiotics pending cultures. WBC 16,000. He has some mild erythema around chest tube sites. Main sternal incision clean and dry.  He has postoperative mild posterior pericardial effusion probably related to his poor nutritional status and reduction in LV size with VAD  LVAD INTERROGATION:  Patrick II LVAD:  Flow 4.8 liters/min, speed 9200, power 5.8 PI 6.8  Controller Serial intact   Objective:    Vital Signs:   Temp:  [97.6 F (36.4 C)-98.5 F (36.9 C)] 98.5 F (36.9 C) (02/10 1532) Pulse Rate:  [31-96] 31 (02/10 1500) Resp:  [16-31] 25 (02/10 1600) BP: (58-104)/(44-86) 84/67 mmHg (02/10 1600) SpO2:  [80 %-100 %] 80 % (02/10 1500) Weight:  [174 lb 13.Humphrey oz (79.3 kg)] 174 lb 13.Humphrey oz (79.3 kg) (02/10 0500) Last BM Date: 05/18/15 Mean arterial Pressure  75-85  Intake/Output:   Intake/Output Summary (Last 24 hours) at 05/20/15 1854 Last data filed at 05/20/15 1700  Gross per 24 hour  Intake 2500.4 ml  Output   2550 ml  Net  -49.6 ml     Physical Exam: General:  Well appearing. No resp difficulty, jaundiced skin improving HEENT: normal Neck: supple.  JVP . Carotids Humphrey+ bilat; no bruits. No lymphadenopathy or thryomegaly appreciated. Cor: Mechanical heart sounds with LVAD hum present. Lungs: clear Abdomen: soft, nontender, nondistended. No hepatosplenomegaly. No bruits or masses. Good bowel sounds. Extremities: no cyanosis, clubbing, rash, edema Neuro: alert & orientedx3, cranial nerves grossly intact. moves all 4 extremities w/o difficulty. Affect pleasant  Telemetry: sinus tach  Labs: Basic Metabolic Panel:  Recent Labs Lab 05/14/15 0415 05/15/15 0405 05/16/15 0501 05/17/15 0510 05/18/15 0450 05/18/15 0527 05/19/15 0730 05/20/15 0620  NA 128* 127* 127* 126*  --  127* 124* 126*  K 4.Humphrey 3.8 3.3* 3.3*  --  3.9 3.8 3.4*  CL 93* 93* 92* 91*  --  92* 90* 90*  CO2 24 25 24 24   --  24 24 28   GLUCOSE 103* 91 78 95  --  110* 100* 91  BUN 15 16 12 12   --  12 11 10   CREATININE 0.94 0.82 0.76 0.77  --  0.78 0.84 0.73  CALCIUM 8.9 8.6* 8.0* 8.Humphrey*  --  8.1* 7.9* 8.3*  MG 1.7 1.7 1.6* 1.9 Humphrey.0  --   --   --     Liver Function Tests:  Recent Labs Lab 05/16/15 0501 05/17/15 0510 05/18/15 0527 05/19/15 0730 05/20/15 0620  AST 57* 65* 57* 59* 53*  ALT 26 30 30 29 24   ALKPHOS 91 106 107 110 102  BILITOT 6.Humphrey* 6.3* 5.Humphrey* 5.5* 4.3*  PROT 5.3* 6.0* 5.9* 5.7* 5.6*  ALBUMIN Humphrey.6* Humphrey.8* Humphrey.5* Humphrey.4* Humphrey.4*   No results for input(s): LIPASE, AMYLASE in the last 168 hours.  No results for input(s): AMMONIA in the last 168 hours.  CBC:  Recent Labs Lab 05/14/15 0415 05/15/15 0405 05/16/15 0501 05/17/15 0510 05/19/15 0455 05/20/15 0620  WBC 9.0 7.5 9.6 12.Humphrey* 16.6* 16.0*  NEUTROABS 6.4 4.9 6.5 8.6*  --   --   HGB 8.9* 8.8* 9.0* 9.4* 9.8* 9.4*  HCT 25.7* 25.3* 25.5* 26.9* 29.5* 27.8*  MCV 94.5 93.7 93.4 93.7 93.9 93.9  PLT 136* 126* 178 291 393 374    INR:  Recent Labs Lab 05/16/15 0501 05/17/15 0510 05/18/15 0527 05/19/15 0455 05/20/15 0620  INR 3.22* 3.18* 3.73* Humphrey.98* Humphrey.71*    Other results:  EKG:   Imaging: Dg Chest  Port 1 View  Humphrey/01/2016  CLINICAL DATA:  50 year old male with LVAD. Chest soreness. Initial encounter. EXAM: PORTABLE CHEST 1 VIEW COMPARISON:  05/19/2015 and earlier. FINDINGS: Portable AP semi upright view at 0553 hours. Stable visible LVAD. Stable cardiomegaly and mediastinal contours. Stable right chest AICD. Stable left subclavian central line. No left pneumothorax visible today. Continued dense retrocardiac opacity. No acute pulmonary edema. Small right pleural effusion suspected and stable. IMPRESSION: 1.  Stable lines and tubes. Humphrey. No pneumothorax identified today. 3. Stable dense lower lobe collapse or consolidation and small right effusion. Electronically Signed   By: Genevie Ann M.D.   On: 05/20/2015 07:39   Dg Chest Port 1 View  Humphrey/12/2015  CLINICAL DATA:  Chest tube removal prior to physical therapy EXAM: PORTABLE CHEST 1 VIEW COMPARISON:  05/18/2015 FINDINGS: Heart is enlarged. Status post median sternotomy. Left ventricular assist device. Patient has right-sided transvenous pacemaker/ AICD with lead to the right ventricle. Patient has a left-sided central line, tip to the superior vena cava. Left pneumothorax is smaller, estimated be approximately 5% of left lung volume. There is opacity of both lung bases, similar in appearance to prior study. IMPRESSION: 1. Smaller left pneumothorax. Humphrey. Persistent bibasilar opacity, left greater than right. Electronically Signed   By: Nolon Nations M.D.   On: 05/19/2015 10:47     Medications:     Scheduled Medications: . antiseptic oral rinse  7 mL Mouth Rinse BID  . aspirin EC  81 mg Oral Daily  . bisacodyl  10 mg Oral Daily   Or  . bisacodyl  10 mg Rectal Daily  . cefTAZidime (FORTAZ)  IV  1 g Intravenous 3 times per day  . chlorpheniramine-HYDROcodone  5 mL Oral QHS  . citalopram  20 mg Oral Daily  . digoxin  0.125 mg Oral Daily  . docusate sodium  200 mg Oral Daily  . feeding supplement  1 Container Oral TID BM  . ferrous  [id number] C-folic acid  1 capsule Oral TID PC  . guaiFENesin  600 mg Oral BID  . megestrol  20 mg Oral Daily  . pantoprazole  40 mg Oral Daily  . potassium chloride  40 mEq Oral BID  . sildenafil  20 mg Oral TID  . sodium chloride flush  10 mL Intravenous Q12H  . sodium chloride flush  3 mL Intravenous Q12H  . vancomycin  1,000 mg Intravenous Q12H  . Warfarin - Pharmacist Dosing Inpatient   Does not apply q1800    Infusions: . sodium chloride 10 mL/hr at 05/20/15 1700  . sodium chloride    . sodium chloride    . amiodarone 30 mg/hr (05/20/15 1700)  . milrinone 0.125 mcg/kg/min (05/20/15 1700)    PRN Medications: hydrALAZINE, ondansetron (ZOFRAN) IV, oxyCODONE, sodium chloride flush, sodium chloride flush,  sodium chloride flush, traMADol, zolpidem   Assessment:  Left subclavian triple-lumen catheter placed Humphrey-Humphrey after attempt at PICC line was unsuccessful Recurrent atrial fibrillation with evidence of RV dysfunction on echo-treated with DC cardioversion and maintaining sinus rhythm with clinical improvement overall  Postoperative jaundice slowly improving bilirubin   Surgical incisions clean and dry however since chest tubes had been in place a week and his white count is 16 ,000 Will continue with IV vancomycin and Zosyn Urine culture negative so far  Plan/Discussion:   Continue diuresis with lasix  Advance diet, maximize protein intake , try megace to improve appetite  Follow bilirubin Continue with physical therapy after cardioversion back to sinus rhythm Continue with VAD education Careful monitoring and therapy for RV dysfunction include sildenafil and milrinone and maintenance of sinus rhythm. Doubt he'll need pericardial window for the small effusion.     I reviewed the LVAD parameters from today, and compared the results to the patient's prior recorded data.  No programming changes were made.  The LVAD is functioning within specified parameters.  The  patient performs LVAD self-test daily.  LVAD interrogation was negative for any significant power changes, alarms or PI events/speed drops.  LVAD equipment check completed and is in good working order.  Back-up equipment present.   LVAD education done on emergency procedures and precautions and reviewed exit site care.  Length of Stay: [place] Birmingham III Humphrey/01/2016, 6:54 PM

## 2015-05-20 NOTE — Care Management Important Message (Signed)
Important Message  Patient Details  Name: Patrick Humphrey MRN: [id number] Date of Birth: 07-11-1965   Medicare Important Message Given:  Yes    [name] 05/20/2015, 11:13 AM

## 2015-05-20 NOTE — Progress Notes (Signed)
PT Cancellation Note  Patient Details Name: UVALDO PARLETTE MRN: [id number] DOB: Apr 04, 1966   Cancelled Treatment:    Reason Eval/Treat Not Completed: Medical issues which prohibited therapy Holding PT tx this AM due to pt with episodes of hypotension and PI events. Will follow up in PM as time allows.   Marguarite Arbour A Aasiyah Auerbach 05/20/2015, 12:10 PM [name], [place], DPT [phone]

## 2015-05-20 NOTE — Progress Notes (Signed)
Admitted 04/29/15 due to acute on chronic systolic CHF.   HeartMate II LVAD implated on 05/10/15 by Dr. Darcey Nora for DT with hope for bridge to candidacy.   Vital signs: Temp:  97.8 HR: 114 - 132 Doppler Pressure: 53 - 68 Automatic BP: 100 - 124 / 90s  MAP: 98 - 104 O2 Sat:  95 - 99%  Wt in lbs:  160 > ... > 181 > 180 > 180    LVAD interrogation reveals:  Speed: 9200 Flow:  4.1 Power:  5.0w PI:  5.8 (was 2.5 - 3.2 this AM) Alarms: none Events: 22 PI events since MN for today with lowest recorded PI 2.5  Fixed speed:  9000 Low speed limit: 8400  Drive Line: Dressing C/D/I. Continue daily care. Leave stitch for malnutrition until D/W Dr. Prescott Gum about timing.   Telemetry:  HR 100 - 110s ST   Labs:  LDH trend:  247 > 282 > 276  > ... > 253 > 249 > 290  INR trend:  1.57 > 1.53 > 1.33 > ... > 3.18 > 3.73 > 2.98  Hgb:  11.2 > ... > 9.0 > 9.4 > 9.8  WBC 16k today   Blood products: Intra op:  6 units FFP Post Op:   05/10/15 ---> 2 units PC, 1 unit plts  Gtts: Milrinone 0.375 ---> 0.25 on 05/12/15 Amiodarone --->  started 05/13/15 for A flutter Levophed ---> off 05/11/15 Epi ---> off 05/12/15   NO:  Weaned off 05/12/15   PICC line: placed 05/12/15  Arrhythmia Management: 05/13/15--> IV Amiodarone bolus 05/17/15--> IV Amiodarone bolus   Cardioversion on 05/17/15 for AFib/Flutter   Plan/Recommendations:  1. D/C Teaching scheduled Thursday at 10:00. Very anxious as to determine a D/C date. Discussed that we are still titrating medications and finding what will work for him so he can go home set up for success.   2. Home equipment will be delivered once transferred to SDU.  3. Trial of vest and belt attachment today and throughout weekend.   4. CoOx better 63% today with Milrinone dose. Not tolerating much for exercise but feels appetite has picked up since starting megace. Encouraged to keep pushing himself to do small meals/snacks and small walks/activities several times  throughout the day and then work on extending time.   5. Discussed several aspects of home care and equipment maintenance today. [name] was reading over the D/C binder and had great questions. Discussed the importance of writing down numbers daily and learning what his baseline trend is.   Janene Madeira, RN VAD Coordinator   Office: [phone] 24/7 VAD Pager: [phone]

## 2015-05-20 NOTE — Progress Notes (Signed)
Contacted by staff RN regarding low MAPs.   He is alert and oriented. Denies dyspnea but complains of fatigue. Map running low after dose of losartan. CVP 7. Stop losartan, spiro, and lasix.   Give albumin and 250 cc NS bolus.   Dr Haroldine Laws at bedside. Dr Aundra Dubin aware.   Amy Clegg NP-C  12:28 PM  Maps improved after albumin and fluid bolus.   Map 82.  Amy Clegg NP-C  1:11 PM

## 2015-05-20 NOTE — Progress Notes (Signed)
1330 Offered to walk with pt at 1300 and he stated he needed a little more time to get ready. PT in with pt now. Graylon Good RN BSN 05/20/2015 1:31 PM

## 2015-05-20 NOTE — Progress Notes (Signed)
ANTICOAGULATION CONSULT NOTE   Pharmacy Consult for Warfarin Indication: post LVAD  Vital Signs: Temp: 97.8 F (36.6 C) (02/10 0000) Temp Source: Oral (02/10 0000) BP: 94/82 mmHg (02/10 0600) Pulse Rate: 87 (02/10 0600)  Labs:  Recent Labs  05/18/15 0527 05/19/15 0455 05/19/15 0730 05/20/15 0620  HGB  --  9.8*  --  9.4*  HCT  --  29.5*  --  27.8*  PLT  --  393  --  374  LABPROT 36.0* 30.5*  --  28.4*  INR 3.73* 2.98*  --  2.71*  CREATININE 0.78  --  0.84  --     Estimated Creatinine Clearance: 112.1 mL/min (by C-G formula based on Cr of 0.84).   Assessment: 49yom with HF s/p LVAD placed 05/10/15.  Warfarin started for anticoagulation 05/12/15 with ASA 81mg  daily.  INR jumped quickly to 3.22 after 3 doses, looked to be stabilizing but then bumped again to 3.7. INR stabilizing and down to 2.7 this morning. Hgb down slightly to 9.4 overall stable.  Cardioverted 2/8, continues on amio gtt for one more day at reduced rate of 30mg /hr  ID: no fevers overnight, wbc stable at 16. Started empiric vancomycin on 2/7 with plans to continue until tubes are out. Fortaz added 2/9 with bump in wbc. No allergic rxns noted.  VT this morning was right around goal at 17, will not make adjustments at this time.  Urine cx ordered 2/9 does not appear to be done, will need to follow up length of therapy for abx now that tubes are out.  Goal of Therapy:  Vancomycin trough 10-20 INR 2-2.5 Monitor platelets by anticoagulation protocol: Yes   Plan:  Warfarin 1mg  tonight Daily INR for now Monitor s/s bleeding Continue Vancomycin 1g q12 hours Continue Fortaz 1g q8 Follow up length of therapy for abx   Erin Hearing PharmD., BCPS Clinical Pharmacist Pager [phone] 05/20/2015 7:05 AM

## 2015-05-21 LAB — COMPREHENSIVE METABOLIC PANEL
ALT: 29 U/L (ref 17–63)
AST: 64 U/L — ABNORMAL HIGH (ref 15–41)
Albumin: 2.6 g/dL — ABNORMAL LOW (ref 3.5–5.0)
Alkaline Phosphatase: 116 U/L (ref 38–126)
Anion gap: 9 (ref 5–15)
BUN: 8 mg/dL (ref 6–20)
CO2: 26 mmol/L (ref 22–32)
Calcium: 8.2 mg/dL — ABNORMAL LOW (ref 8.9–10.3)
Chloride: 91 mmol/L — ABNORMAL LOW (ref 101–111)
Creatinine, Ser: 0.55 mg/dL — ABNORMAL LOW (ref 0.61–1.24)
GFR calc Af Amer: >60 mL/min (ref >60)
GFR calc non Af Amer: >60 mL/min (ref >60)
Glucose, Bld: 92 mg/dL (ref 65–99)
Potassium: 4 mmol/L (ref 3.5–5.1)
Sodium: 126 mmol/L — ABNORMAL LOW (ref 135–145)
Total Bilirubin: 4.4 mg/dL — ABNORMAL HIGH (ref 0.3–1.2)
Total Protein: 5.4 g/dL — ABNORMAL LOW (ref 6.5–8.1)

## 2015-05-21 LAB — CBC
HCT: 28.6 % — ABNORMAL LOW (ref 39.0–52.0)
Hemoglobin: 9.5 g/dL — ABNORMAL LOW (ref 13.0–17.0)
MCH: 31.4 pg (ref 26.0–34.0)
MCHC: 33.2 g/dL (ref 30.0–36.0)
MCV: 94.4 fL (ref 78.0–100.0)
Platelets: 416 10*3/uL — ABNORMAL HIGH (ref 150–400)
RBC: 3.03 MIL/uL — ABNORMAL LOW (ref 4.22–5.81)
RDW: 18 % — ABNORMAL HIGH (ref 11.5–15.5)
WBC: 19 10*3/uL — ABNORMAL HIGH (ref 4.0–10.5)

## 2015-05-21 LAB — CARBOXYHEMOGLOBIN
Carboxyhemoglobin: 2.7 % — ABNORMAL HIGH (ref 0.5–1.5)
Methemoglobin: 0.9 % (ref 0.0–1.5)
O2 Saturation: 60.3 %
Total hemoglobin: 9.7 g/dL — ABNORMAL LOW (ref 13.5–18.0)

## 2015-05-21 LAB — LACTATE DEHYDROGENASE: LDH: 241 U/L — ABNORMAL HIGH (ref 98–192)

## 2015-05-21 LAB — PROTIME-INR
INR: 2.19 — ABNORMAL HIGH (ref 0.00–1.49)
Prothrombin Time: 24.1 seconds — ABNORMAL HIGH (ref 11.6–15.2)

## 2015-05-21 MED ORDER — FLUTICASONE PROPIONATE 50 MCG/ACT NA SUSP
1.0000 | Freq: Every day | NASAL | Status: DC
  Administered 2015-05-22 – 2015-05-30 (×9): 1 via NASAL
  Filled 2015-05-21: qty 16

## 2015-05-21 MED ORDER — AMIODARONE HCL 200 MG PO TABS
400.0000 mg | ORAL_TABLET | Freq: Two times a day (BID) | ORAL | Status: DC
  Administered 2015-05-21 – 2015-05-26 (×11): 400 mg via ORAL
  Filled 2015-05-21 (×11): qty 2

## 2015-05-21 MED ORDER — FUROSEMIDE 40 MG PO TABS: 40.0000 mg | ORAL_TABLET | Freq: Two times a day (BID) | ORAL | Status: DC

## 2015-05-21 MED ORDER — FLUCONAZOLE IN SODIUM CHLORIDE 100-0.9 MG/50ML-% IV SOLN
100.0000 mg | INTRAVENOUS | Status: AC
  Administered 2015-05-21 – 2015-05-24 (×4): 100 mg via INTRAVENOUS
  Filled 2015-05-21 (×6): qty 50

## 2015-05-21 MED ORDER — WARFARIN SODIUM 1 MG PO TABS
1.0000 mg | ORAL_TABLET | Freq: Once | ORAL | Status: AC
  Administered 2015-05-21: 1 mg via ORAL
  Filled 2015-05-21: qty 1

## 2015-05-21 MED ORDER — FUROSEMIDE 40 MG PO TABS
40.0000 mg | ORAL_TABLET | Freq: Every day | ORAL | Status: DC
  Filled 2015-05-21: qty 1

## 2015-05-21 MED ORDER — LORATADINE 10 MG PO TABS
10.0000 mg | ORAL_TABLET | Freq: Every day | ORAL | Status: DC
  Administered 2015-05-22 – 2015-05-30 (×9): 10 mg via ORAL
  Filled 2015-05-21 (×10): qty 1

## 2015-05-21 MED ORDER — MONTELUKAST SODIUM 10 MG PO TABS
10.0000 mg | ORAL_TABLET | Freq: Every day | ORAL | Status: DC
  Administered 2015-05-21 – 2015-05-29 (×9): 10 mg via ORAL
  Filled 2015-05-21 (×9): qty 1

## 2015-05-21 MED ORDER — FUROSEMIDE 40 MG PO TABS
40.0000 mg | ORAL_TABLET | Freq: Every day | ORAL | Status: DC
  Administered 2015-05-21 – 2015-05-23 (×3): 40 mg via ORAL
  Filled 2015-05-21 (×2): qty 1

## 2015-05-21 NOTE — Progress Notes (Addendum)
ANTICOAGULATION CONSULT NOTE - Follow Up Consult  Pharmacy Consult for Coumadin Indication: s/p LVAD  Allergies  Allergen Reactions  . Penicillins     Per patient he tolerated a form of PCN as an adult  Has patient had a PCN reaction causing immediate rash, facial/tongue/throat swelling, SOB or lightheadedness with hypotension: Yes Has patient had a PCN reaction causing severe rash involving mucus membranes or skin necrosis: No Has patient had a PCN reaction that required hospitalization No Has patient had a PCN reaction occurring within the last 10 years: No If all of the above answers are "NO", then may proceed with Cephalosporin use.  Unkno  . Sulfa Antibiotics     GI upset    Patient Measurements: Height: 5\' 11"  (180.3 cm) Weight: 171 lb 15.3 oz (78 kg) IBW/kg (Calculated) : 75.3  Vital Signs: Temp: 98.2 F (36.8 C) (02/11 1137) Temp Source: Oral (02/11 1137) BP: 90/78 mmHg (02/11 1100) Pulse Rate: 47 (02/11 1100)  Labs:  Recent Labs  05/19/15 0455 05/19/15 0730 05/20/15 0620 05/21/15 0420  HGB 9.8*  --  9.4* 9.5*  HCT 29.5*  --  27.8* 28.6*  PLT 393  --  374 416*  LABPROT 30.5*  --  28.4* 24.1*  INR 2.98*  --  2.71* 2.19*  CREATININE  --  0.84 0.73 0.55*    Estimated Creatinine Clearance: 117.7 mL/min (by C-G formula based on Cr of 0.55).   Assessment: 50 year old male with long-standing history of chronic systolic heart failure, nonischemic cardiomyopathy, ICD, Dr. Gwenlyn Found, who has been experiencing increasing dyspnea consistent with acute on chronic systolic heart failure.  LVAD 1/31  AC: heparin off post vad - moderate amount of bleeding noted during op, MD starting warfarin 2/2, INR 2.7>2.19, Cbc stable. Monitor for drug interaction with Amio and new fluconazole   Goal of Therapy:  INR 2-3 Monitor platelets by anticoagulation protocol: Yes   Plan:  -Warfarin - 1mg  tonight Daily INR   Joandy Burget S. Alford Highland, PharmD, BCPS Clinical Staff  Pharmacist Pager [phone]  Eilene Ghazi Stillinger 05/21/2015,12:12 PM

## 2015-05-21 NOTE — Progress Notes (Signed)
Came to ambulate however he was outside with PT. Will f/u as time allows Monday. Yves Dill CES, ACSM 3:19 PM 05/21/2015

## 2015-05-21 NOTE — Progress Notes (Signed)
Patient ID: Patrick Humphrey, male   DOB: Jan 07, 1966, 50 y.o.   MRN: [id number] HeartMate 2 Rounding Note  Subjective:    LVAD placement 05/10/15.   Extubated 2/1.  Got 2 units PRBCs 1/31. Today's Hgb 9.5 .    05/18/15 S/P TEE and successful DC-CV. Left  ventricle was mildly dilated with EF 15% and septal akinesis. The LVAD inflow cannula was visualized at the apex. The RV with mildly dilated with severe systolic dysfunction. Pacemaker in RV. There was trivial TR. The aortic valve did not open. There was mild aortic insufficiency. Trivial TR. Mildly dilated LA with no LA appendage thrombus. Normal RA. Moderate pericardial effusion along the LV lateral wall, no tamponade. LVAD speed increased to 9200.   He is jaundiced, total bilirubin increased.  Pre-op 4.2 => post-op 9.8 => 8.6 => 7.4 => 6.2 => 6.3 => 5.2--> 5.7 --> 4.3 --> 4.4.   (elevated direct bilirubin as well).   MAP dropped again during day yesterday.  Losartan and spironolactone stopped, Lasix held, and he got albumin.    Multiple PI events yesterday and overnight, none when standing and walking per nursing.  CVP 12.   Remains in NSR today, feel good and ready to walk.    LVAD INTERROGATION:  HeartMate II LVAD:  Flow 3.7 liters/min, speed 9200,  power 4.7, PI 4.3  Multiple PI events over last 24 hrs   Objective:    Vital Signs:   Temp:  [98.2 F (36.8 C)-98.5 F (36.9 C)] 98.2 F (36.8 C) (02/11 1137) Pulse Rate:  [31-113] 47 (02/11 1100) Resp:  [17-31] 23 (02/11 1100) BP: (84-100)/(67-82) 90/78 mmHg (02/11 1100) SpO2:  [91 %-100 %] 95 % (02/11 1100) Weight:  [171 lb 15.3 oz (78 kg)] 171 lb 15.3 oz (78 kg) (02/11 0800) Last BM Date: 05/18/15 Mean arterial Pressure 80s    Intake/Output:   Intake/Output Summary (Last 24 hours) at 05/21/15 1148 Last data filed at 05/21/15 1100  Gross per 24 hour  Intake 1494.5 ml  Output    825 ml  Net  669.5 ml     Physical Exam: CVP 12 General:  NAD. Jaundice. No resp  difficulty. In bed.   HEENT: normal Neck: supple. JVP 8-9. Carotids 2+ bilat; no bruits. No lymphadenopathy or thryomegaly appreciated. L subclavian TLC Cor: Mechanical heart sounds with LVAD hum present. Lungs: Decreased in the bases.  Abdomen: soft, nontender, nondistended. No hepatosplenomegaly. No bruits or masses. Good bowel sounds. Driveline: C/D/I; securement device intact and driveline incorporated Extremities: no cyanosis, clubbing, rash, R and LLE ted hose. R and L ankle 1+  edema.  Neuro: alert & orientedx3, cranial nerves grossly intact. moves all 4 extremities w/o difficulty. Affect pleasant  Telemetry: SR 90s    Labs: Basic Metabolic Panel:  Recent Labs Lab 05/15/15 0405 05/16/15 0501 05/17/15 0510 05/18/15 0450 05/18/15 0527 05/19/15 0730 05/20/15 0620 05/21/15 0420  NA 127* 127* 126*  --  127* 124* 126* 126*  K 3.8 3.3* 3.3*  --  3.9 3.8 3.4* 4.0  CL 93* 92* 91*  --  92* 90* 90* 91*  CO2 25 24 24   --  24 24 28 26   GLUCOSE 91 78 95  --  110* 100* 91 92  BUN 16 12 12   --  12 11 10 8   CREATININE 0.82 0.76 0.77  --  0.78 0.84 0.73 0.55*  CALCIUM 8.6* 8.0* 8.2*  --  8.1* 7.9* 8.3* 8.2*  MG 1.7 1.6* 1.9 2.0  --   --   --   --  Liver Function Tests:  Recent Labs Lab 05/17/15 0510 05/18/15 0527 05/19/15 0730 05/20/15 0620 05/21/15 0420  AST 65* 57* 59* 53* 64*  ALT 30 30 29 24 29   ALKPHOS 106 107 110 102 116  BILITOT 6.3* 5.2* 5.5* 4.3* 4.4*  PROT 6.0* 5.9* 5.7* 5.6* 5.4*  ALBUMIN 2.8* 2.5* 2.4* 2.4* 2.6*   No results for input(s): LIPASE, AMYLASE in the last 168 hours. No results for input(s): AMMONIA in the last 168 hours.  CBC:  Recent Labs Lab 05/15/15 0405 05/16/15 0501 05/17/15 0510 05/19/15 0455 05/20/15 0620 05/21/15 0420  WBC 7.5 9.6 12.2* 16.6* 16.0* 19.0*  NEUTROABS 4.9 6.5 8.6*  --   --   --   HGB 8.8* 9.0* 9.4* 9.8* 9.4* 9.5*  HCT 25.3* 25.5* 26.9* 29.5* 27.8* 28.6*  MCV 93.7 93.4 93.7 93.9 93.9 94.4  PLT 126* 178 291 393  374 416*    INR:  Recent Labs Lab 05/17/15 0510 05/18/15 0527 05/19/15 0455 05/20/15 0620 05/21/15 0420  INR 3.18* 3.73* 2.98* 2.71* 2.19*    Other results:  EKG:   Imaging: Dg Chest Port 1 View  05/20/2015  CLINICAL DATA:  50 year old male with LVAD. Chest soreness. Initial encounter. EXAM: PORTABLE CHEST 1 VIEW COMPARISON:  05/19/2015 and earlier. FINDINGS: Portable AP semi upright view at 0553 hours. Stable visible LVAD. Stable cardiomegaly and mediastinal contours. Stable right chest AICD. Stable left subclavian central line. No left pneumothorax visible today. Continued dense retrocardiac opacity. No acute pulmonary edema. Small right pleural effusion suspected and stable. IMPRESSION: 1.  Stable lines and tubes. 2. No pneumothorax identified today. 3. Stable dense lower lobe collapse or consolidation and small right effusion. Electronically Signed   By: Genevie Ann M.D.   On: 05/20/2015 07:39     Medications:     Scheduled Medications: . amiodarone  400 mg Oral BID  . antiseptic oral rinse  7 mL Mouth Rinse BID  . aspirin EC  81 mg Oral Daily  . bisacodyl  10 mg Oral Daily   Or  . bisacodyl  10 mg Rectal Daily  . cefTAZidime (FORTAZ)  IV  1 g Intravenous 3 times per day  . chlorpheniramine-HYDROcodone  5 mL Oral QHS  . citalopram  20 mg Oral Daily  . digoxin  0.125 mg Oral Daily  . docusate sodium  200 mg Oral Daily  . feeding supplement  1 Container Oral TID BM  . ferrous Q000111Q C-folic acid  1 capsule Oral TID PC  . fluconazole (DIFLUCAN) IV  100 mg Intravenous Q24H  . [START ON 05/22/2015] furosemide  40 mg Oral Daily  . guaiFENesin  600 mg Oral BID  . megestrol  20 mg Oral Daily  . pantoprazole  40 mg Oral Daily  . sildenafil  20 mg Oral TID  . sodium chloride flush  10 mL Intravenous Q12H  . sodium chloride flush  3 mL Intravenous Q12H  . vancomycin  1,000 mg Intravenous Q12H  . Warfarin - Pharmacist Dosing Inpatient   Does not apply q1800     Infusions: . sodium chloride 10 mL/hr at 05/21/15 0700  . sodium chloride    . sodium chloride    . milrinone 0.125 mcg/kg/min (05/21/15 0700)    PRN Medications: hydrALAZINE, ondansetron (ZOFRAN) IV, oxyCODONE, sodium chloride flush, sodium chloride flush, sodium chloride flush, traMADol, zolpidem   Assessment:   1. Nonischemic cardiomyopathy: Long-standing, EF 20-25%.  Coronary angiography this admission with no significant CAD. Cardiogenic shock requiring milrinone  and norepinephrine, IABP placed. Heartmate II LVAD placed 05/10/15.  2. Post-op blood loss anemia: 2 units PRBCs 1/31.  3. Elevate bilirubin: Noted pre-op, thought due to RV failure.  Higher post-op with jaundice.  Elevated direct bilirubin, so liver source rather than hemolysis.  4. Atrial flutter: Resolved on amiodarone.  5. Hyponatremia 6. Atrial fibrillation with RVR 2/7.  TEE-guided DCCV 05/18/15.  7. RV failure: Revatio begun 2/7.  8. Small left apical PTX.  9. Elevated WBCs: Empiric vancomycin/ceftazidime and fluconazole added.  Bibasilar opacity on CXR.   Plan/Discussion:    ECHO with severe RV dysfunction and mod pericardial effusion. Continue sildenafil 20 mg tid and milrinone 0.125 mcg/kg/min, co-ox 60%.  Will be very careful with milrinone weaning, would go a couple more days before trying again to stop it.  Continue digoxin.   S/P TEE successful DC-CV 2/8. Based on TEE, speed increased to 9200.  HR now in 90s, remains on amiodarone.  Can convert amiodarone today to po.     Hypotensive yesterday, losartan, spironolactone, and Lasix stopped.  MAP 80s to low 90s today.  With CVP 12, will give him Lasix 40 mg po daily for now.   INR today 2.41, hemoglobin stable.  Continue ASA 81 mg daily.      WBCs 19, higher.  Afebrile.  CXR persistent bibasilar opacities.  Cover HCAP with vanco/ceftazidime.  With ongoing rise in WBCs, added fluconazole.  Send cultures.  Plan to remove CVL and place PICC, will need IR.    Total bilirubin 4.4.  Elevation pre-op thought due to CHF/RV failure, more marked post-op. Normal alkaline phosphatase.  LDH stable 249>290>224>241. Elevated direct bilirubin suggests liver source.   I reviewed the LVAD parameters from today, and compared the results to the patient's prior recorded data.  No programming changes were made.  The LVAD is functioning within specified parameters.  The patient performs LVAD self-test daily.  LVAD interrogation was negative for any significant power changes, alarms or PI events/speed drops.  LVAD equipment check completed and is in good working order.  Back-up equipment present.   LVAD education done on emergency procedures and precautions and reviewed exit site care.  35 minutes critical care time.   Length of Stay: [place]    05/21/2015, 11:48 AM  VAD Team --- VAD ISSUES ONLY--- Pager [phone] (7am - 7am)  Advanced Heart Failure Team  Pager [phone] (M-F; 7a - 4p)  Please contact [facility] for night-coverage after hours (4p -7a ) and weekends on [email or web address]

## 2015-05-21 NOTE — Progress Notes (Signed)
Physical Therapy Treatment Patient Details Name: Patrick Humphrey MRN: [id number] DOB: 1965/11/27 Today's Date: 05/21/2015    History of Present Illness patient is a 50 yo male with nonischemic cardiomyopathy since 2002 with acute on chronic systolic heart failure and cardiogenic shock, deteriorating on 2 inotropes and had IABP placed. Moderate RV systolic dysfunction on preop echo.  s/p HeartMate II LVAD    PT Comments    Patient had a rough night last night due to coughing. Reports being tired today and having pain in chest due to coughing. Tolerated ambulating 150' x2 bouts with long seated rest break. Took pt outside to sit in sun to improve mood and spirits.   Continued LVAD education. Pt and wife together switched from monitor to battery, donned vest and packed black bag with supervision and setup of equipment. Min A and cues to donn vest due to lines. Will continue to follow/   Follow Up Recommendations  Home health PT;Supervision/Assistance - 24 hour     Equipment Recommendations  Other (comment) (rollator)    Recommendations for Other Services       Precautions / Restrictions Precautions Precautions: Sternal;Fall Precaution Comments: sternal,LVAD precautions- black bag at all times    Mobility  Bed Mobility               General bed mobility comments: Sitting in chair upon PT arrival.   Transfers Overall transfer level: Needs assistance Equipment used: Pushed w/c   Sit to Stand: Min assist;+2 safety/equipment Stand pivot transfers: Min assist;+2 safety/equipment       General transfer comment: Min A to boost to standing from chair x2 holding heart pillow.  Transferred to [facility] post ambulation bout.  Ambulation/Gait Ambulation/Gait assistance: Min guard Ambulation Distance (Feet): 150 Feet (x2 bouts) Assistive device:  (pushed w/c) Gait Pattern/deviations: Decreased stride length;Step-through pattern;Trunk flexed Gait velocity: decreased Gait velocity  interpretation: Below normal speed for age/gender General Gait Details: SLow guarded gait with cues for upright posture. HR stable 90-108 bpm. 2 short standing rest breaks. Long seated rest break in w/c. Fatigued today.   Stairs            Wheelchair Mobility    Modified Rankin (Stroke Patients Only)       Balance Overall balance assessment: Needs assistance Sitting-balance support: Feet supported;No upper extremity supported Sitting balance-Leahy Scale: Good     Standing balance support: During functional activity Standing balance-Leahy Scale: Fair Standing balance comment: Able to take a few steps around chair with Min guard for balance.                     Cognition Arousal/Alertness: Awake/alert Behavior During Therapy: WFL for tasks assessed/performed Overall Cognitive Status: Within Functional Limits for tasks assessed                      Exercises      General Comments General comments (skin integrity, edema, etc.): Wife present during session. Assisted with packing black bag and changing from monitor to battery.      Pertinent Vitals/Pain Pain Assessment: Faces Faces Pain Scale: Hurts a little bit Pain Location: sternum from coughing Pain Descriptors / Indicators: Guarding;Sore Pain Intervention(s): Monitored during session;Repositioned    Home Living                      Prior Function            PT Goals (current goals can now be  found in the care plan section) Progress towards PT goals: Progressing toward goals    Frequency  Min 4X/week    PT Plan Current plan remains appropriate    Co-evaluation             End of Session   Activity Tolerance: Patient limited by fatigue Patient left: Other (comment) (BSC with wife and RN inr oom.)     Time: 1353-1500 PT Time Calculation (min) (ACUTE ONLY): 67 min  Charges:  $Gait Training: 23-37 mins $Therapeutic Activity: 8-22 mins                    G Codes:       Corie Allis A Cherylyn Sundby 05/21/2015, 4:02 PM [name], [place], DPT [phone]

## 2015-05-22 LAB — URINE CULTURE: Culture: NO GROWTH

## 2015-05-22 LAB — CBC
HCT: 28.3 % — ABNORMAL LOW (ref 39.0–52.0)
Hemoglobin: 9.4 g/dL — ABNORMAL LOW (ref 13.0–17.0)
MCH: 31.6 pg (ref 26.0–34.0)
MCHC: 33.2 g/dL (ref 30.0–36.0)
MCV: 95.3 fL (ref 78.0–100.0)
Platelets: 408 10*3/uL — ABNORMAL HIGH (ref 150–400)
RBC: 2.97 MIL/uL — ABNORMAL LOW (ref 4.22–5.81)
RDW: 17.9 % — ABNORMAL HIGH (ref 11.5–15.5)
WBC: 20.7 10*3/uL — ABNORMAL HIGH (ref 4.0–10.5)

## 2015-05-22 LAB — EXPECTORATED SPUTUM ASSESSMENT W GRAM STAIN, RFLX TO RESP C

## 2015-05-22 LAB — CARBOXYHEMOGLOBIN
Carboxyhemoglobin: 2.8 % — ABNORMAL HIGH (ref 0.5–1.5)
Methemoglobin: 0.9 % (ref 0.0–1.5)
O2 Saturation: 56.9 %
Total hemoglobin: 9.6 g/dL — ABNORMAL LOW (ref 13.5–18.0)

## 2015-05-22 LAB — COMPREHENSIVE METABOLIC PANEL
ALT: 33 U/L (ref 17–63)
AST: 59 U/L — ABNORMAL HIGH (ref 15–41)
Albumin: 2.4 g/dL — ABNORMAL LOW (ref 3.5–5.0)
Alkaline Phosphatase: 131 U/L — ABNORMAL HIGH (ref 38–126)
Anion gap: 12 (ref 5–15)
BUN: 9 mg/dL (ref 6–20)
CO2: 23 mmol/L (ref 22–32)
Calcium: 8.1 mg/dL — ABNORMAL LOW (ref 8.9–10.3)
Chloride: 91 mmol/L — ABNORMAL LOW (ref 101–111)
Creatinine, Ser: 0.63 mg/dL (ref 0.61–1.24)
GFR calc Af Amer: >60 mL/min (ref >60)
GFR calc non Af Amer: >60 mL/min (ref >60)
Glucose, Bld: 83 mg/dL (ref 65–99)
Potassium: 3.4 mmol/L — ABNORMAL LOW (ref 3.5–5.1)
Sodium: 126 mmol/L — ABNORMAL LOW (ref 135–145)
Total Bilirubin: 5.5 mg/dL — ABNORMAL HIGH (ref 0.3–1.2)
Total Protein: 5.2 g/dL — ABNORMAL LOW (ref 6.5–8.1)

## 2015-05-22 LAB — PROTIME-INR
INR: 2.08 — ABNORMAL HIGH (ref 0.00–1.49)
Prothrombin Time: 23.2 seconds — ABNORMAL HIGH (ref 11.6–15.2)

## 2015-05-22 LAB — LACTATE DEHYDROGENASE: LDH: 258 U/L — ABNORMAL HIGH (ref 98–192)

## 2015-05-22 MED ORDER — SORBITOL 70 % SOLN
30.0000 mL | Freq: Once | Status: AC
  Administered 2015-05-22: 30 mL via ORAL
  Filled 2015-05-22: qty 30

## 2015-05-22 MED ORDER — PIPERACILLIN-TAZOBACTAM 3.375 G IVPB: 3.3750 g | Freq: Four times a day (QID) | INTRAVENOUS | Status: DC

## 2015-05-22 MED ORDER — WARFARIN SODIUM 2 MG PO TABS
2.0000 mg | ORAL_TABLET | Freq: Once | ORAL | Status: AC
  Administered 2015-05-22: 2 mg via ORAL
  Filled 2015-05-22: qty 1

## 2015-05-22 MED ORDER — SODIUM CHLORIDE 0.9 % IV SOLN
500.0000 mg | Freq: Four times a day (QID) | INTRAVENOUS | Status: AC
  Administered 2015-05-22 – 2015-05-28 (×26): 500 mg via INTRAVENOUS
  Filled 2015-05-22 (×29): qty 500

## 2015-05-22 MED ORDER — POTASSIUM CHLORIDE CRYS ER 20 MEQ PO TBCR
20.0000 meq | EXTENDED_RELEASE_TABLET | Freq: Once | ORAL | Status: AC
  Administered 2015-05-23: 20 meq via ORAL
  Filled 2015-05-22: qty 1

## 2015-05-22 MED ORDER — POTASSIUM CHLORIDE CRYS ER 20 MEQ PO TBCR
40.0000 meq | EXTENDED_RELEASE_TABLET | Freq: Once | ORAL | Status: AC
  Administered 2015-05-22: 40 meq via ORAL
  Filled 2015-05-22: qty 2

## 2015-05-22 NOTE — Progress Notes (Signed)
MD paged about CVP reading 2-3, PI on vad 5s-6s. Map mid 80s-90s. Also pt had no BM since 05/18/2015. Order given to give pt one time dose of sorbitol. Will cont to monitor and assess pt

## 2015-05-22 NOTE — Progress Notes (Signed)
ANTICOAGULATION CONSULT NOTE - Follow Up Consult  Pharmacy Consult for Coumadin Indication: s/p LVAD  Allergies  Allergen Reactions  . Penicillins     Per patient he tolerated a form of PCN as an adult  Has patient had a PCN reaction causing immediate rash, facial/tongue/throat swelling, SOB or lightheadedness with hypotension: Yes Has patient had a PCN reaction causing severe rash involving mucus membranes or skin necrosis: No Has patient had a PCN reaction that required hospitalization No Has patient had a PCN reaction occurring within the last 10 years: No If all of the above answers are "NO", then may proceed with Cephalosporin use.  Unkno  . Sulfa Antibiotics     GI upset    Patient Measurements: Height: 5\' 11"  (180.3 cm) Weight: 171 lb 1.2 oz (77.6 kg) IBW/kg (Calculated) : 75.3  Vital Signs: Temp: 98.3 F (36.8 C) (02/12 0731) Temp Source: Oral (02/12 0731) BP: 99/85 mmHg (02/12 0800) Pulse Rate: 101 (02/12 0900)  Labs:  Recent Labs  05/20/15 0620 05/21/15 0420 05/22/15 0503  HGB 9.4* 9.5* 9.4*  HCT 27.8* 28.6* 28.3*  PLT 374 416* 408*  LABPROT 28.4* 24.1* 23.2*  INR 2.71* 2.19* 2.08*  CREATININE 0.73 0.55* 0.63    Estimated Creatinine Clearance: 117.7 mL/min (by C-G formula based on Cr of 0.63).   Assessment: 50 year old male with long-standing history of chronic systolic heart failure, nonischemic cardiomyopathy, ICD, Dr. Gwenlyn Found, who has been experiencing increasing dyspnea consistent with acute on chronic systolic heart failure.  LVAD 1/31  AC: heparin off post vad - moderate amount of bleeding noted during op, MD starting warfarin 2/2, INR 2.7>2.19>2.08 today, Cbc stable. Monitor for drug interaction with Amio and new fluconazole   Goal of Therapy:  INR 2-3 Monitor platelets by anticoagulation protocol: Yes   Plan:  -Warfarin - 2mg  tonight - Vanco 1g IV q12h--per MD - f/u K+ supplementation 2/12   Nakaya Mishkin S. Alford Highland, PharmD,  BCPS Clinical Staff Pharmacist Pager [phone]  Eilene Ghazi Stillinger 05/22/2015,10:28 AM

## 2015-05-22 NOTE — Progress Notes (Signed)
Patient ID: Patrick Humphrey, male   DOB: [birth year], 50 y.o.   MRN: [id number] HeartMate 2 Rounding Note  Subjective:    LVAD placement 05/10/15.   Extubated 2/1.  Got 2 units PRBCs 1/31. Today's Hgb 9.4.    05/18/15 S/P TEE and successful DC-CV. Left  ventricle was mildly dilated with EF 15% and septal akinesis. The LVAD inflow cannula was visualized at the apex. The RV with mildly dilated with severe systolic dysfunction. Pacemaker in RV. There was trivial TR. The aortic valve did not open. There was mild aortic insufficiency. Trivial TR. Mildly dilated LA with no LA appendage thrombus. Normal RA. Moderate pericardial effusion along the LV lateral wall, no tamponade. LVAD speed increased to 9200.   He is jaundiced, total bilirubin increased.  Pre-op 4.2 => post-op 9.8 => 8.6 => 7.4 => 6.2 => 6.3 => 5.2--> 5.7 --> 4.3 --> 4.4 --> 5.5.   (elevated direct bilirubin as well).   Multiple PI events overnight.  CVP 9.   Remains in NSR today.    Walked twice yesterday, did well with that.   He had some serosanguineous drainage from his driveline site.  WBCs to 20.  He is on broad spectrum abx, no fever.    LVAD INTERROGATION:  HeartMate II LVAD:  Flow 3.7 liters/min, speed 9200,  power 5.1, PI 6.6. About 20 PI events over last 24 hrs   Objective:    Vital Signs:   Temp:  [97.5 F (36.4 C)-98.3 F (36.8 C)] 97.5 F (36.4 C) (02/12 1151) Pulse Rate:  [91-121] 105 (02/12 1100) Resp:  [15-28] 23 (02/12 1100) BP: (83-107)/(68-93) 93/77 mmHg (02/12 1100) SpO2:  [90 %-99 %] 94 % (02/12 1100) Weight:  [171 lb 1.2 oz (77.6 kg)] 171 lb 1.2 oz (77.6 kg) (02/12 0400) Last BM Date: 05/18/15 Mean arterial Pressure 80s    Intake/Output:   Intake/Output Summary (Last 24 hours) at 05/22/15 1253 Last data filed at 05/22/15 1100  Gross per 24 hour  Intake 1465.7 ml  Output    925 ml  Net  540.7 ml     Physical Exam: CVP 9 General:  NAD. Jaundice. No resp difficulty. In bed.    HEENT: normal Neck: supple. JVP 7-8. Carotids 2+ bilat; no bruits. No lymphadenopathy or thryomegaly appreciated. L subclavian TLC Cor: Mechanical heart sounds with LVAD hum present. Lungs: Decreased in the bases.  Abdomen: soft, nontender, nondistended. No hepatosplenomegaly. No bruits or masses. Good bowel sounds. Driveline: C/D/I; securement device intact and driveline incorporated Extremities: no cyanosis, clubbing, rash, R and LLE ted hose. R and L ankle trace edema.  Neuro: alert & orientedx3, cranial nerves grossly intact. moves all 4 extremities w/o difficulty. Affect pleasant  Telemetry: SR 90s    Labs: Basic Metabolic Panel:  Recent Labs Lab 05/16/15 0501 05/17/15 0510 05/18/15 0450 05/18/15 0527 05/19/15 0730 05/20/15 0620 05/21/15 0420 05/22/15 0503  NA 127* 126*  --  127* 124* 126* 126* 126*  K 3.3* 3.3*  --  3.9 3.8 3.4* 4.0 3.4*  CL 92* 91*  --  92* 90* 90* 91* 91*  CO2 24 24  --  24 24 28 26 23   GLUCOSE 78 95  --  110* 100* 91 92 83  BUN 12 12  --  12 11 10 8 9   CREATININE 0.76 0.77  --  0.78 0.84 0.73 0.55* 0.63  CALCIUM 8.0* 8.2*  --  8.1* 7.9* 8.3* 8.2* 8.1*  MG 1.6* 1.9 2.0  --   --   --   --   --  Liver Function Tests:  Recent Labs Lab 05/18/15 0527 05/19/15 0730 05/20/15 0620 05/21/15 0420 05/22/15 0503  AST 57* 59* 53* 64* 59*  ALT 30 29 24 29  33  ALKPHOS 107 110 102 116 131*  BILITOT 5.2* 5.5* 4.3* 4.4* 5.5*  PROT 5.9* 5.7* 5.6* 5.4* 5.2*  ALBUMIN 2.5* 2.4* 2.4* 2.6* 2.4*   No results for input(s): LIPASE, AMYLASE in the last 168 hours. No results for input(s): AMMONIA in the last 168 hours.  CBC:  Recent Labs Lab 05/16/15 0501 05/17/15 0510 05/19/15 0455 05/20/15 0620 05/21/15 0420 05/22/15 0503  WBC 9.6 12.2* 16.6* 16.0* 19.0* 20.7*  NEUTROABS 6.5 8.6*  --   --   --   --   HGB 9.0* 9.4* 9.8* 9.4* 9.5* 9.4*  HCT 25.5* 26.9* 29.5* 27.8* 28.6* 28.3*  MCV 93.4 93.7 93.9 93.9 94.4 95.3  PLT 178 291 393 374 416* 408*     INR:  Recent Labs Lab 05/18/15 0527 05/19/15 0455 05/20/15 0620 05/21/15 0420 05/22/15 0503  INR 3.73* 2.98* 2.71* 2.19* 2.08*    Other results:  EKG:   Imaging: No results found.   Medications:     Scheduled Medications: . amiodarone  400 mg Oral BID  . antiseptic oral rinse  7 mL Mouth Rinse BID  . aspirin EC  81 mg Oral Daily  . bisacodyl  10 mg Oral Daily   Or  . bisacodyl  10 mg Rectal Daily  . cefTAZidime (FORTAZ)  IV  1 g Intravenous 3 times per day  . chlorpheniramine-HYDROcodone  5 mL Oral QHS  . citalopram  20 mg Oral Daily  . digoxin  0.125 mg Oral Daily  . docusate sodium  200 mg Oral Daily  . feeding supplement  1 Container Oral TID BM  . ferrous Q000111Q C-folic acid  1 capsule Oral TID PC  . fluconazole (DIFLUCAN) IV  100 mg Intravenous Q24H  . fluticasone  1 spray Each Nare Daily  . furosemide  40 mg Oral Daily  . guaiFENesin  600 mg Oral BID  . loratadine  10 mg Oral Daily  . megestrol  20 mg Oral Daily  . montelukast  10 mg Oral QHS  . pantoprazole  40 mg Oral Daily  . sildenafil  20 mg Oral TID  . sodium chloride flush  10 mL Intravenous Q12H  . sodium chloride flush  3 mL Intravenous Q12H  . vancomycin  1,000 mg Intravenous Q12H  . warfarin  2 mg Oral ONCE-1800  . Warfarin - Pharmacist Dosing Inpatient   Does not apply q1800    Infusions: . sodium chloride 10 mL/hr at 05/22/15 0700  . sodium chloride    . sodium chloride    . milrinone 0.125 mcg/kg/min (05/22/15 1100)    PRN Medications: hydrALAZINE, ondansetron (ZOFRAN) IV, oxyCODONE, sodium chloride flush, sodium chloride flush, sodium chloride flush, traMADol, zolpidem   Assessment:   1. Nonischemic cardiomyopathy: Long-standing, EF 20-25%.  Coronary angiography this admission with no significant CAD. Cardiogenic shock requiring milrinone and norepinephrine, IABP placed. Heartmate II LVAD placed 05/10/15.  2. Post-op blood loss anemia: 2 units PRBCs 1/31.  3.  Elevate bilirubin: Noted pre-op, thought due to RV failure.  Higher post-op with jaundice.  Elevated direct bilirubin, so liver source rather than hemolysis.  4. Atrial flutter: Resolved on amiodarone.  5. Hyponatremia 6. Atrial fibrillation with RVR 2/7.  TEE-guided DCCV 05/18/15.  7. RV failure: Revatio begun 2/7.  8. Small left apical PTX.  9.  Elevated WBCs: Empiric vancomycin/ceftazidime and fluconazole added.  Bibasilar opacity on CXR.   Plan/Discussion:    ECHO with severe RV dysfunction and mod pericardial effusion. Continue sildenafil 20 mg tid, digoxin 0.125, and milrinone 0.125 mcg/kg/min.  Co-ox marginal but acceptable for now at 57% today.  Will not try to wean off milrinone at this time.  S/P TEE successful DC-CV 2/8. Based on TEE, speed increased to 9200.  HR now in 90s, remains on amiodarone (now po).     MAP stable.  CVP 9, getting Lasix 40 mg once daily.  With RV failure and multiple PI events, CVP in 9-10 range may be ideal for him.  Will not increase Lasix.    INR today 2.08, hemoglobin stable.  Continue ASA 81 mg daily.   LDH stable.   WBCs 20, higher.  Afebrile.  CXR persistent bibasilar opacities.  Covering HCAP with vanco/ceftazidime.  With ongoing rise in WBCs, added fluconazole.  Sent cultures, blood cultures NGTD.  Plan to remove CVL and place PICC, will need IR.  He also has some serosanguineous drainage from driveline site.   Total bilirubin 5.5.  Elevation pre-op thought due to CHF/RV failure, more marked post-op. Normal alkaline phosphatase.  LDH stable 249>290>224>241>258. Elevated direct bilirubin suggests liver source.   I reviewed the LVAD parameters from today, and compared the results to the patient's prior recorded data.  No programming changes were made.  The LVAD is functioning within specified parameters.  The patient performs LVAD self-test daily.  LVAD interrogation was negative for any significant power changes, alarms or PI events/speed drops.  LVAD  equipment check completed and is in good working order.  Back-up equipment present.   LVAD education done on emergency procedures and precautions and reviewed exit site care.  Length of Stay: [place]    05/22/2015, 12:53 PM  VAD Team --- VAD ISSUES ONLY--- Pager [phone] (7am - 7am)  Advanced Heart Failure Team  Pager [phone] (M-F; 7a - 4p)  Please contact [facility] for night-coverage after hours (4p -7a ) and weekends on [email or web address]

## 2015-05-23 ENCOUNTER — Inpatient Hospital Stay (HOSPITAL_COMMUNITY): Payer: Medicare Other

## 2015-05-23 DIAGNOSIS — I509 Heart failure, unspecified: Secondary | ICD-10-CM

## 2015-05-23 LAB — COMPREHENSIVE METABOLIC PANEL
ALT: 35 U/L (ref 17–63)
AST: 65 U/L — ABNORMAL HIGH (ref 15–41)
Albumin: 2.2 g/dL — ABNORMAL LOW (ref 3.5–5.0)
Alkaline Phosphatase: 132 U/L — ABNORMAL HIGH (ref 38–126)
Anion gap: 10 (ref 5–15)
BUN: 8 mg/dL (ref 6–20)
CO2: 23 mmol/L (ref 22–32)
Calcium: 7.8 mg/dL — ABNORMAL LOW (ref 8.9–10.3)
Chloride: 92 mmol/L — ABNORMAL LOW (ref 101–111)
Creatinine, Ser: 0.6 mg/dL — ABNORMAL LOW (ref 0.61–1.24)
GFR calc Af Amer: >60 mL/min (ref >60)
GFR calc non Af Amer: >60 mL/min (ref >60)
Glucose, Bld: 154 mg/dL — ABNORMAL HIGH (ref 65–99)
Potassium: 3.4 mmol/L — ABNORMAL LOW (ref 3.5–5.1)
Sodium: 125 mmol/L — ABNORMAL LOW (ref 135–145)
Total Bilirubin: 5.3 mg/dL — ABNORMAL HIGH (ref 0.3–1.2)
Total Protein: 5.3 g/dL — ABNORMAL LOW (ref 6.5–8.1)

## 2015-05-23 LAB — PROTIME-INR
INR: 2.11 — ABNORMAL HIGH (ref 0.00–1.49)
Prothrombin Time: 23.5 seconds — ABNORMAL HIGH (ref 11.6–15.2)

## 2015-05-23 LAB — CARBOXYHEMOGLOBIN
Carboxyhemoglobin: 3.3 % — ABNORMAL HIGH (ref 0.5–1.5)
Methemoglobin: 0.8 % (ref 0.0–1.5)
O2 Saturation: 55.7 %
Total hemoglobin: 9.4 g/dL — ABNORMAL LOW (ref 13.5–18.0)

## 2015-05-23 LAB — CBC
HCT: 27.7 % — ABNORMAL LOW (ref 39.0–52.0)
Hemoglobin: 9.2 g/dL — ABNORMAL LOW (ref 13.0–17.0)
MCH: 31.7 pg (ref 26.0–34.0)
MCHC: 33.2 g/dL (ref 30.0–36.0)
MCV: 95.5 fL (ref 78.0–100.0)
Platelets: 418 10*3/uL — ABNORMAL HIGH (ref 150–400)
RBC: 2.9 MIL/uL — ABNORMAL LOW (ref 4.22–5.81)
RDW: 17.6 % — ABNORMAL HIGH (ref 11.5–15.5)
WBC: 18.8 10*3/uL — ABNORMAL HIGH (ref 4.0–10.5)

## 2015-05-23 LAB — LACTATE DEHYDROGENASE: LDH: 281 U/L — ABNORMAL HIGH (ref 98–192)

## 2015-05-23 MED ORDER — BENZONATATE 100 MG PO CAPS
100.0000 mg | ORAL_CAPSULE | Freq: Two times a day (BID) | ORAL | Status: DC
  Administered 2015-05-23 – 2015-05-30 (×15): 100 mg via ORAL
  Filled 2015-05-23 (×15): qty 1

## 2015-05-23 MED ORDER — FUROSEMIDE 40 MG PO TABS
40.0000 mg | ORAL_TABLET | ORAL | Status: DC
  Administered 2015-05-25: 40 mg via ORAL
  Filled 2015-05-23: qty 1

## 2015-05-23 MED ORDER — LIDOCAINE HCL 1 % IJ SOLN
INTRAMUSCULAR | Status: AC
  Filled 2015-05-23: qty 20

## 2015-05-23 MED ORDER — MILRINONE IN DEXTROSE 20 MG/100ML IV SOLN
0.1250 ug/kg/min | INTRAVENOUS | Status: DC
  Administered 2015-05-23 – 2015-05-24 (×2): 0.125 ug/kg/min via INTRAVENOUS
  Filled 2015-05-23 (×2): qty 100

## 2015-05-23 MED ORDER — ALBUMIN HUMAN 25 % IV SOLN
12.5000 g | Freq: Once | INTRAVENOUS | Status: AC
  Administered 2015-05-23: 12.5 g via INTRAVENOUS
  Filled 2015-05-23: qty 50

## 2015-05-23 MED ORDER — POTASSIUM CHLORIDE CRYS ER 20 MEQ PO TBCR
40.0000 meq | EXTENDED_RELEASE_TABLET | Freq: Once | ORAL | Status: AC
  Administered 2015-05-23: 40 meq via ORAL
  Filled 2015-05-23: qty 2

## 2015-05-23 MED ORDER — SILDENAFIL CITRATE 20 MG PO TABS
40.0000 mg | ORAL_TABLET | Freq: Three times a day (TID) | ORAL | Status: DC
  Administered 2015-05-24 – 2015-05-30 (×19): 40 mg via ORAL
  Filled 2015-05-23 (×21): qty 2

## 2015-05-23 MED ORDER — WARFARIN SODIUM 2 MG PO TABS
2.0000 mg | ORAL_TABLET | Freq: Once | ORAL | Status: AC
  Administered 2015-05-23: 2 mg via ORAL
  Filled 2015-05-23: qty 1

## 2015-05-23 MED ORDER — FUROSEMIDE 10 MG/ML IJ SOLN
20.0000 mg | Freq: Once | INTRAMUSCULAR | Status: AC
  Administered 2015-05-23: 20 mg via INTRAVENOUS
  Filled 2015-05-23: qty 2

## 2015-05-23 NOTE — Care Management Important Message (Signed)
Important Message  Patient Details  Name: SLATON COURTER MRN: [id number] Date of Birth: 24-Jan-1966   Medicare Important Message Given:  Yes    Carleena Mires P Shanine Kreiger 05/23/2015, 2:02 PM

## 2015-05-23 NOTE — Progress Notes (Signed)
PT Cancellation Note  Patient Details Name: Patrick Humphrey MRN: [id number] DOB: [birth year]   Cancelled Treatment:    Reason Eval/Treat Not Completed: Patient at procedure or test/unavailable Pt at IR. PT to return as able.   Ara Kussmaul 05/23/2015, 7:55 AM Ara Kussmaul, Student Physical Therapist Acute Rehab [phone]

## 2015-05-23 NOTE — Progress Notes (Addendum)
ANTICOAGULATION CONSULT NOTE - Follow Up Consult  Pharmacy Consult for Coumadin Indication: s/p LVAD  Patient Measurements: Height: 5\' 11"  (180.3 cm) Weight: 176 lb (79.833 kg) IBW/kg (Calculated) : 75.3  Vital Signs: Temp: 97.7 F (36.5 C) (02/13 0403) Temp Source: Oral (02/13 0403) BP: 95/82 mmHg (02/13 0600) Pulse Rate: 100 (02/13 0700)  Labs:  Recent Labs  05/21/15 0420 05/22/15 0503 05/23/15 0403  HGB 9.5* 9.4* 9.2*  HCT 28.6* 28.3* 27.7*  PLT 416* 408* 418*  LABPROT 24.1* 23.2* 23.5*  INR 2.19* 2.08* 2.11*  CREATININE 0.55* 0.63 0.60*    Estimated Creatinine Clearance: 117.7 mL/min (by C-G formula based on Cr of 0.6).   Assessment: 50 year old male with long-standing history of chronic systolic heart failure, nonischemic cardiomyopathy, ICD, Dr. Gwenlyn Found, who has been experiencing increasing dyspnea consistent with acute on chronic systolic heart failure.  LVAD 1/31  AC: heparin off post vad. INR 2.1 today, Cbc stable. Continue to monitor for drug interaction with Amio and new fluconazole  ID: no fevers, wbc stable 18(down), CXR persistent bibasilar opacities. Cover HCAP with vanco/ceftazidime (per MD)  Vanc 2/7>>  --2/10 VT -17 - no changes Fortaz 2/9>>2/12 Primaxin 2/12>> Fluconazole 2/11>2/15  1/29 mrsa pcr - neg 2/11 BC x 2>>ngtd 2/11 Urine - ngtd  Goal of Therapy:  INR 2-3  Vancomycin level 10-20 Monitor platelets by anticoagulation protocol: Yes   Plan:  -Warfarin - 2mg  tonight -Vanco 1g IV q12h - check trough later this week -Primaxin 500mg  IV q6 hours  Erin Hearing PharmD., BCPS Clinical Pharmacist Pager [phone] 05/23/2015 10:04 AM

## 2015-05-23 NOTE — Progress Notes (Signed)
HeartMate 2 Rounding Note     Postop day #13 HeartMate 2 LVAD implantation   Subjective:   History of nonischemic cardiomyopathy, ejection fraction 15% Preoperative cardiogenic shock on balloon pump and dual inotropes Preoperative protein deficiency malnutrition with prealbumin 8.4 Preoperative moderate RV dysfunction Preoperative hepatic insufficiency with bilirubin of 4.2 Status post AICD implantation 2002  Patient has maintained sinus rhythm after cardioversionand feels better-better strength, better appetite  able to stand and walk in the hallway.  His VAD parameters remain excellent and he is diuresing well.  Last mediastinal drain ( pocket drain) was removed today.  He has postoperative liver insufficiency with jaundice, bilirubin now down to 4-5  He has postoperative elevation in white count on empiric antibiotics pending cultures. WBC 16,000. He has some mild erythema around chest tube sites. Main sternal incision clean and dry. He has erythema-cellulitis around the driveline exit site with minimal drainage. We'll continue IV vancomycin, Zosyn, and by mouth Diflucan  He has postoperative mild posterior pericardial effusion probably related to his poor nutritional status and reduction in LV size with VAD  LVAD INTERROGATION:  HeartMate II LVAD:  Flow 4.8 liters/min, speed 9200, power 5.8 PI 6.8  Controller Serial intact   Objective:    Vital Signs:   Temp:  [96.8 F (36 C)-97.9 F (36.6 C)] 96.8 F (36 C) (02/13 1926) Pulse Rate:  [77-104] 94 (02/13 1700) Resp:  [18-34] 18 (02/13 1700) BP: (82-127)/(62-105) 91/66 mmHg (02/13 1830) SpO2:  [89 %-99 %] 97 % (02/13 1700) Weight:  [176 lb (79.833 kg)] 176 lb (79.833 kg) (02/13 0600) Last BM Date: 05/22/15 Mean arterial Pressure  75-85  Intake/Output:   Intake/Output Summary (Last 24 hours) at 05/23/15 1929 Last data filed at 05/23/15 1700  Gross per 24 hour  Intake   1666 ml  Output   1275 ml  Net    391 ml      Physical Exam: General:  Well appearing. No resp difficulty, jaundiced skin improving HEENT: normal Neck: supple. JVP . Carotids 2+ bilat; no bruits. No lymphadenopathy or thryomegaly appreciated. Cor: Mechanical heart sounds with LVAD hum present. Lungs: clear Abdomen: soft, nontender, nondistended. No hepatosplenomegaly. No bruits or masses. Good bowel sounds. Extremities: no cyanosis, clubbing, rash, edema Neuro: alert & orientedx3, cranial nerves grossly intact. moves all 4 extremities w/o difficulty. Affect pleasant  Telemetry: sinus tach  Labs: Basic Metabolic Panel:  Recent Labs Lab 05/17/15 0510 05/18/15 0450  05/19/15 0730 05/20/15 0620 05/21/15 0420 05/22/15 0503 05/23/15 0403  NA 126*  --   < > 124* 126* 126* 126* 125*  K 3.3*  --   < > 3.8 3.4* 4.0 3.4* 3.4*  CL 91*  --   < > 90* 90* 91* 91* 92*  CO2 24  --   < > 24 28 26 23 23   GLUCOSE 95  --   < > 100* 91 92 83 154*  BUN 12  --   < > 11 10 8 9 8   CREATININE 0.77  --   < > 0.84 0.73 0.55* 0.63 0.60*  CALCIUM 8.2*  --   < > 7.9* 8.3* 8.2* 8.1* 7.8*  MG 1.9 2.0  --   --   --   --   --   --   < > = values in this interval not displayed.  Liver Function Tests:  Recent Labs Lab 05/19/15 0730 05/20/15 0620 05/21/15 0420 05/22/15 0503 05/23/15 0403  AST 59* 53* 64* 59* 65*  ALT 29 24 29  33 35  ALKPHOS 110 102 116 131* 132*  BILITOT 5.5* 4.3* 4.4* 5.5* 5.3*  PROT 5.7* 5.6* 5.4* 5.2* 5.3*  ALBUMIN 2.4* 2.4* 2.6* 2.4* 2.2*   No results for input(s): LIPASE, AMYLASE in the last 168 hours. No results for input(s): AMMONIA in the last 168 hours.  CBC:  Recent Labs Lab 05/17/15 0510 05/19/15 0455 05/20/15 0620 05/21/15 0420 05/22/15 0503 05/23/15 0403  WBC 12.2* 16.6* 16.0* 19.0* 20.7* 18.8*  NEUTROABS 8.6*  --   --   --   --   --   HGB 9.4* 9.8* 9.4* 9.5* 9.4* 9.2*  HCT 26.9* 29.5* 27.8* 28.6* 28.3* 27.7*  MCV 93.7 93.9 93.9 94.4 95.3 95.5  PLT 291 393 374 416* 408* 418*    INR:  Recent  Labs Lab 05/19/15 0455 05/20/15 0620 05/21/15 0420 05/22/15 0503 05/23/15 0403  INR 2.98* 2.71* 2.19* 2.08* 2.11*    Other results:  EKG:   Imaging: Ir Fluoro Guide Cv Line Left  05/23/2015  INDICATION: 50 year old with nonischemic cardiomyopathy and severe RV dysfunction. Central venous access needed for medications. EXAM: PICC LINE PLACEMENT WITH ULTRASOUND AND FLUOROSCOPIC GUIDANCE MEDICATIONS: None ANESTHESIA/SEDATION: None FLUOROSCOPY TIME:  Fluoroscopy Time: 1 minutes and 12 seconds. 2.5 mGy COMPLICATIONS: None immediate. PROCEDURE: The patient was advised of the possible risks and complications and agreed to undergo the procedure. The patient was then brought to the angiographic suite for the procedure. The left arm was prepped with chlorhexidine, draped in the usual sterile fashion using maximum barrier technique (cap and mask, sterile gown, sterile gloves, large sterile sheet, hand hygiene and cutaneous antiseptic). Local anesthesia was attained by infiltration with 1% lidocaine. Ultrasound demonstrated patency of the left basilic vein, and this was documented with an image. Under real-time ultrasound guidance, this vein was accessed with a 21 gauge micropuncture needle and image documentation was performed. The needle was exchanged over a guidewire for a peel-away sheath through which a 43 cm 5 [place] dual lumen power injectable PICC was advanced, and positioned with its tip at the lower SVC/right atrial junction. Fluoroscopy during the procedure and fluoro spot radiograph confirms appropriate catheter position. The catheter was flushed, secured to the skin, and covered with a sterile dressing. IMPRESSION: Successful placement of a left arm PICC with sonographic and fluoroscopic guidance. The catheter is ready for use. Electronically Signed   By: Markus Daft M.D.   On: 05/23/2015 09:26   Ir US Guide Vasc Access Left  05/23/2015  INDICATION: 50 year old with nonischemic cardiomyopathy and  severe RV dysfunction. Central venous access needed for medications. EXAM: PICC LINE PLACEMENT WITH ULTRASOUND AND FLUOROSCOPIC GUIDANCE MEDICATIONS: None ANESTHESIA/SEDATION: None FLUOROSCOPY TIME:  Fluoroscopy Time: 1 minutes and 12 seconds. 2.5 mGy COMPLICATIONS: None immediate. PROCEDURE: The patient was advised of the possible risks and complications and agreed to undergo the procedure. The patient was then brought to the angiographic suite for the procedure. The left arm was prepped with chlorhexidine, draped in the usual sterile fashion using maximum barrier technique (cap and mask, sterile gown, sterile gloves, large sterile sheet, hand hygiene and cutaneous antiseptic). Local anesthesia was attained by infiltration with 1% lidocaine. Ultrasound demonstrated patency of the left basilic vein, and this was documented with an image. Under real-time ultrasound guidance, this vein was accessed with a 21 gauge micropuncture needle and image documentation was performed. The needle was exchanged over a guidewire for a peel-away sheath through which a 43 cm 5 [place] dual lumen power  injectable PICC was advanced, and positioned with its tip at the lower SVC/right atrial junction. Fluoroscopy during the procedure and fluoro spot radiograph confirms appropriate catheter position. The catheter was flushed, secured to the skin, and covered with a sterile dressing. IMPRESSION: Successful placement of a left arm PICC with sonographic and fluoroscopic guidance. The catheter is ready for use. Electronically Signed   By: Markus Daft M.D.   On: 05/23/2015 09:26   Dg Chest Port 1 View  05/23/2015  CLINICAL DATA:  History of cardiac failure. Left ventricular assist device in place. EXAM: PORTABLE CHEST 1 VIEW COMPARISON:  Single view of the chest 05/20/2015 and 05/19/2015. FINDINGS: Since the prior studies, there has been increase in bilateral pleural effusions, larger on the left. Scattered atelectasis in the left chest again  seen. No pneumothorax is identified. There is cardiomegaly and vascular congestion. Left subclavian central venous catheter, AICD and left ventricular assist device remain in place. IMPRESSION: Small to moderate bilateral pleural effusions, larger on the left. Cardiomegaly and vascular congestion. Electronically Signed   By: Inge Rise M.D.   On: 05/23/2015 07:42     Medications:     Scheduled Medications: . amiodarone  400 mg Oral BID  . antiseptic oral rinse  7 mL Mouth Rinse BID  . aspirin EC  81 mg Oral Daily  . benzonatate  100 mg Oral BID  . bisacodyl  10 mg Oral Daily   Or  . bisacodyl  10 mg Rectal Daily  . chlorpheniramine-HYDROcodone  5 mL Oral QHS  . citalopram  20 mg Oral Daily  . digoxin  0.125 mg Oral Daily  . docusate sodium  200 mg Oral Daily  . feeding supplement  1 Container Oral TID BM  . ferrous [id number] C-folic acid  1 capsule Oral TID PC  . fluconazole (DIFLUCAN) IV  100 mg Intravenous Q24H  . fluticasone  1 spray Each Nare Daily  . [START ON 05/25/2015] furosemide  40 mg Oral QODAY  . guaiFENesin  600 mg Oral BID  . imipenem-cilastatin  500 mg Intravenous Q6H  . lidocaine      . loratadine  10 mg Oral Daily  . megestrol  20 mg Oral Daily  . montelukast  10 mg Oral QHS  . pantoprazole  40 mg Oral Daily  . sildenafil  20 mg Oral TID  . [START ON 05/24/2015] sildenafil  40 mg Oral TID  . sodium chloride flush  10 mL Intravenous Q12H  . sodium chloride flush  3 mL Intravenous Q12H  . vancomycin  1,000 mg Intravenous Q12H  . Warfarin - Pharmacist Dosing Inpatient   Does not apply q1800    Infusions: . sodium chloride 10 mL/hr at 05/23/15 0700  . sodium chloride    . milrinone 0.125 mcg/kg/min (05/23/15 1218)    PRN Medications: hydrALAZINE, ondansetron (ZOFRAN) IV, oxyCODONE, sodium chloride flush, sodium chloride flush, sodium chloride flush, traMADol, zolpidem   Assessment:  IR tunneled PICC line placed and triple-lumen central  line removed Elevated white count probably due to infection-cellulitis around driveline exit site. Continue IV antibiotics Postoperative jaundice slowly improving bilirubin <5  Main surgical incisions appear to be healing. Will remove epicardial pacing wires soon. He appears to be stable on low-dose milrinone and should be ready for transfer to stepdown and 24 hours.   Plan/Discussion:   Continue diuresis with lasix  Continue IV antibiotics until driveline exit site improves    Plan repeat 2-D echocardiogram this week to assess RV  function and pericardial effusion   Continue with physical therapy and VAD equipment education    I reviewed the LVAD parameters from today, and compared the results to the patient's prior recorded data.  No programming changes were made.  The LVAD is functioning within specified parameters.  The patient performs LVAD self-test daily.  LVAD interrogation was negative for any significant power changes, alarms or PI events/speed drops.  LVAD equipment check completed and is in good working order.  Back-up equipment present.   LVAD education done on emergency procedures and precautions and reviewed exit site care.  Length of Stay: [facility] 05/23/2015, 7:29 PM

## 2015-05-23 NOTE — Progress Notes (Signed)
Physical Therapy Treatment Patient Details Name: Patrick Humphrey MRN: [id number] DOB: 05/04/1965 Today's Date: 05/23/2015    History of Present Illness patient is a 50 yo male with nonischemic cardiomyopathy since 2002 with acute on chronic systolic heart failure and cardiogenic shock, deteriorating on 2 inotropes and had IABP placed. Moderate RV systolic dysfunction on preop echo.  s/p HeartMate II LVAD    PT Comments    Pt progressing towards acute care goals. Limited by pain medication and R sided chest pain today. Pt tolerated increased waking distance w/o the need for a seated rest break, pushing w/c. Pt's wife able to verbalize and physically don/doff LVAD device for ambualtion w/o any cues. Pt adheres to all precautions with bed mobility and OOB activities. Will continue to follow pt acutely for improved activity tolerance and D/C plan remains the same.   Follow Up Recommendations  Home health PT;Supervision/Assistance - 24 hour     Equipment Recommendations  Other (comment) (rolaltor)    Recommendations for Other Services       Precautions / Restrictions Precautions Precautions: Sternal;Fall Precaution Comments: sternal,LVAD precautions- black bag at all times Restrictions Weight Bearing Restrictions: Yes    Mobility  Bed Mobility Overal bed mobility: Needs Assistance Bed Mobility: Rolling;Sidelying to Sit;Sit to Sidelying Rolling: Min assist Sidelying to sit: Mod assist     Sit to sidelying: Min assist General bed mobility comments: Needing Min to Mod A for pain management, VCs to roll and squuze heart pillow, Assist with LEs and trunk  Transfers Overall transfer level: Needs assistance Equipment used: Pushed w/c Transfers: Sit to/from Stand Sit to Stand: Min assist         General transfer comment: Steady once standing, Adheres to sternal precautions  Ambulation/Gait Ambulation/Gait assistance: Min guard Ambulation Distance (Feet): 300 Feet Assistive  device:  (pushing w/c) Gait Pattern/deviations: Step-through pattern;Decreased stride length;Trunk flexed Gait velocity: decreased Gait velocity interpretation: Below normal speed for age/gender General Gait Details: standing rest breaks to catch breath, limited due to oxycodone taken before session causing increased fatigue   Stairs            Wheelchair Mobility    Modified Rankin (Stroke Patients Only)       Balance Overall balance assessment: Needs assistance Sitting-balance support: Feet supported;No upper extremity supported Sitting balance-Leahy Scale: Good     Standing balance support: During functional activity;Bilateral upper extremity supported Standing balance-Leahy Scale: Fair                      Cognition Arousal/Alertness: Awake/alert Behavior During Therapy: WFL for tasks assessed/performed Overall Cognitive Status: Within Functional Limits for tasks assessed                      Exercises      General Comments General comments (skin integrity, edema, etc.): Wife prepared pt for ambualtion with supervision for donning and doffing LVAD device      Pertinent Vitals/Pain Pain Assessment: Faces Faces Pain Scale: Hurts even more Pain Location: R ribs Pain Descriptors / Indicators: Constant;Guarding Pain Intervention(s): Monitored during session;Limited activity within patient's tolerance    Home Living                      Prior Function            PT Goals (current goals can now be found in the care plan section) Acute Rehab PT Goals Patient Stated Goal: get home Progress  towards PT goals: Progressing toward goals    Frequency  Min 4X/week    PT Plan Current plan remains appropriate    Co-evaluation             End of Session   Activity Tolerance: Patient limited by fatigue Patient left: in bed;with call bell/phone within reach     Time: 1130-1209 PT Time Calculation (min) (ACUTE ONLY): 39  min  Charges:  $Gait Training: 23-37 mins $Self Care/Home Management: 8-22                    G Codes:      Ara Kussmaul 2015/06/14, 1:26 PM  Ara Kussmaul, Student Physical Therapist Acute Rehab [phone]

## 2015-05-23 NOTE — Progress Notes (Signed)
Nutrition Follow-up  DOCUMENTATION CODES:   Not applicable  INTERVENTION:   Continue Boost Breeze po TID between meals, each supplement provides 250 kcal and 9 grams of protein  Continue Magic cup po TID with meals, each supplement provides 290 kcal and 9 grams of protein  NUTRITION DIAGNOSIS:   Increased nutrient needs related to  (post-op healing) as evidenced by estimated needs, ongoing  GOAL:   Patient will meet greater than or equal to 90% of their needs, progressing  MONITOR:   PO intake, Supplement acceptance, Labs, Weight trends, I & O's  ASSESSMENT:   50 year old male with long-standing history of chronic systolic heart failure, nonischemic cardiomyopathy, ICD, Dr. Gwenlyn Found, who has been experiencing increasing dyspnea consistent with acute on chronic systolic heart failure.  Patient s/p procedure 1/31: INSERTION OF HEARTMATE II IMPLANTABLE LEFT VENTRICULAR ASSIST DEVICE   Patient s/p procedure 2/8: TRANSESOPHAGEAL ECHOCARDIOGRAPHY  Patient transferred back to SICU 2/8 for rapid atrial fibrillation and symptoms of right heart failure for cardioversion.  HeartMate 2 Rounding Team meeting with patient at time of RD visit. Pt's wife brought in breakfast from [employer] which he is eating.   No recent % PO intake available in flowsheet records.   Continues to receive Boost Breeze supplements between meals and Magic Cup with meals.   He also has an appetite stimulant ordered - Megace - 20 mg daily.  Diet Order:  Diet heart healthy/carb modified Room service appropriate?: Yes; Fluid consistency:: Thin  Skin:  Reviewed, no issues  Last BM:  2/12  Height:   Ht Readings from Last 1 Encounters:  05/08/15 5\' 11"  (1.803 m)    Weight:   Wt Readings from Last 1 Encounters:  05/23/15 176 lb (79.833 kg)    Ideal Body Weight:  78.2 kg  BMI:  Body mass index is 24.56 kg/(m^2).  Estimated Nutritional Needs:   Kcal:  1800-2000  Protein:  85-100 grams  Fluid:   1.8-2.0 L  EDUCATION NEEDS:   No education needs identified at this time  Arthur Holms, RD, LDN Pager #: [phone] After-Hours Pager #: [phone]

## 2015-05-23 NOTE — Progress Notes (Signed)
Patient ID: Patrick Humphrey, male   DOB: 1966-04-05, 50 y.o.   MRN: [id number] HeartMate 2 Rounding Note  Subjective:    LVAD placement 05/10/15.   Extubated 2/1.  Got 2 units PRBCs 1/31. Today's Hgb 9.4.    05/18/15 S/P TEE and successful DC-CV. Left  ventricle was mildly dilated with EF 15% and septal akinesis. The LVAD inflow cannula was visualized at the apex. The RV with mildly dilated with severe systolic dysfunction. Pacemaker in RV. There was trivial TR. The aortic valve did not open. There was mild aortic insufficiency. Trivial TR. Mildly dilated LA with no LA appendage thrombus. Normal RA. Moderate pericardial effusion along the LV lateral wall, no tamponade. LVAD speed increased to 9200.   He is jaundiced, total bilirubin increased.  Pre-op 4.2 => post-op 9.8 => 8.6 => 7.4 => 6.2 => 6.3 => 5.2--> 5.7 --> 4.3 --> 4.4 --> 5.5-->5.3 .   (elevated direct bilirubin as well).     CVP 6. Remains in NSR today.  He had some serosanguineous drainage from his driveline site.  WBCs to 18.8 .  Blood and Urine - NGTD. He is on broad spectrum abx, no fever   Walked a few times yesterday.    LVAD INTERROGATION:  HeartMate II LVAD:  Flow 4.7 liters/min, speed 9200,  power 5.3, PI 6.6.  6 PI events over last 24 hrs   Objective:    Vital Signs:   Temp:  [97.4 F (36.3 C)-98.2 F (36.8 C)] 97.7 F (36.5 C) (02/13 0403) Pulse Rate:  [92-107] 100 (02/13 0700) Resp:  [19-34] 24 (02/13 0700) BP: (82-107)/(62-93) 95/82 mmHg (02/13 0600) SpO2:  [91 %-99 %] 95 % (02/13 0700) Weight:  [176 lb (79.833 kg)] 176 lb (79.833 kg) (02/13 0600) Last BM Date: 05/22/15 Mean arterial Pressure 80s    Intake/Output:   Intake/Output Summary (Last 24 hours) at 05/23/15 0956 Last data filed at 05/23/15 0700  Gross per 24 hour  Intake   1556 ml  Output    700 ml  Net    856 ml     Physical Exam: CVP 6 General:  NAD. No resp difficulty. In bed.   HEENT: normal Neck: supple. JVP 7-8. Carotids  2+ bilat; no bruits. No lymphadenopathy or thryomegaly appreciated. L subclavian TLC Cor: Mechanical heart sounds with LVAD hum present. Lungs: Decreased in the bases.  Abdomen: soft, nontender, nondistended. No hepatosplenomegaly. No bruits or masses. Good bowel sounds. Driveline: C/D/I; securement device intact and driveline incorporated Extremities: no cyanosis, clubbing, rash, R and LLE ted hose. R and L ankle trace edema. LUE PICC.  Neuro: alert & orientedx3, cranial nerves grossly intact. moves all 4 extremities w/o difficulty. Affect pleasant  Telemetry: SR 90-100s     Labs: Basic Metabolic Panel:  Recent Labs Lab 05/17/15 0510 05/18/15 0450  05/19/15 0730 05/20/15 0620 05/21/15 0420 05/22/15 0503 05/23/15 0403  NA 126*  --   < > 124* 126* 126* 126* 125*  K 3.3*  --   < > 3.8 3.4* 4.0 3.4* 3.4*  CL 91*  --   < > 90* 90* 91* 91* 92*  CO2 24  --   < > 24 28 26 23 23   GLUCOSE 95  --   < > 100* 91 92 83 154*  BUN 12  --   < > 11 10 8 9 8   CREATININE 0.77  --   < > 0.84 0.73 0.55* 0.63 0.60*  CALCIUM 8.2*  --   < >  7.9* 8.3* 8.2* 8.1* 7.8*  MG 1.9 2.0  --   --   --   --   --   --   < > = values in this interval not displayed.  Liver Function Tests:  Recent Labs Lab 05/19/15 0730 05/20/15 0620 05/21/15 0420 05/22/15 0503 05/23/15 0403  AST 59* 53* 64* 59* 65*  ALT 29 24 29  33 35  ALKPHOS 110 102 116 131* 132*  BILITOT 5.5* 4.3* 4.4* 5.5* 5.3*  PROT 5.7* 5.6* 5.4* 5.2* 5.3*  ALBUMIN 2.4* 2.4* 2.6* 2.4* 2.2*   No results for input(s): LIPASE, AMYLASE in the last 168 hours. No results for input(s): AMMONIA in the last 168 hours.  CBC:  Recent Labs Lab 05/17/15 0510 05/19/15 0455 05/20/15 0620 05/21/15 0420 05/22/15 0503 05/23/15 0403  WBC 12.2* 16.6* 16.0* 19.0* 20.7* 18.8*  NEUTROABS 8.6*  --   --   --   --   --   HGB 9.4* 9.8* 9.4* 9.5* 9.4* 9.2*  HCT 26.9* 29.5* 27.8* 28.6* 28.3* 27.7*  MCV 93.7 93.9 93.9 94.4 95.3 95.5  PLT 291 393 374 416* 408*  418*    INR:  Recent Labs Lab 05/19/15 0455 05/20/15 0620 05/21/15 0420 05/22/15 0503 05/23/15 0403  INR 2.98* 2.71* 2.19* 2.08* 2.11*    Other results:  EKG:   Imaging: Ir Fluoro Guide Cv Line Left  05/23/2015  INDICATION: 50 year old with nonischemic cardiomyopathy and severe RV dysfunction. Central venous access needed for medications. EXAM: PICC LINE PLACEMENT WITH ULTRASOUND AND FLUOROSCOPIC GUIDANCE MEDICATIONS: None ANESTHESIA/SEDATION: None FLUOROSCOPY TIME:  Fluoroscopy Time: 1 minutes and 12 seconds. 2.5 mGy COMPLICATIONS: None immediate. PROCEDURE: The patient was advised of the possible risks and complications and agreed to undergo the procedure. The patient was then brought to the angiographic suite for the procedure. The left arm was prepped with chlorhexidine, draped in the usual sterile fashion using maximum barrier technique (cap and mask, sterile gown, sterile gloves, large sterile sheet, hand hygiene and cutaneous antiseptic). Local anesthesia was attained by infiltration with 1% lidocaine. Ultrasound demonstrated patency of the left basilic vein, and this was documented with an image. Under real-time ultrasound guidance, this vein was accessed with a 21 gauge micropuncture needle and image documentation was performed. The needle was exchanged over a guidewire for a peel-away sheath through which a 43 cm 5 [place] dual lumen power injectable PICC was advanced, and positioned with its tip at the lower SVC/right atrial junction. Fluoroscopy during the procedure and fluoro spot radiograph confirms appropriate catheter position. The catheter was flushed, secured to the skin, and covered with a sterile dressing. IMPRESSION: Successful placement of a left arm PICC with sonographic and fluoroscopic guidance. The catheter is ready for use. Electronically Signed   By: Markus Daft M.D.   On: 05/23/2015 09:26   Ir US Guide Vasc Access Left  05/23/2015  INDICATION: 50 year old with  nonischemic cardiomyopathy and severe RV dysfunction. Central venous access needed for medications. EXAM: PICC LINE PLACEMENT WITH ULTRASOUND AND FLUOROSCOPIC GUIDANCE MEDICATIONS: None ANESTHESIA/SEDATION: None FLUOROSCOPY TIME:  Fluoroscopy Time: 1 minutes and 12 seconds. 2.5 mGy COMPLICATIONS: None immediate. PROCEDURE: The patient was advised of the possible risks and complications and agreed to undergo the procedure. The patient was then brought to the angiographic suite for the procedure. The left arm was prepped with chlorhexidine, draped in the usual sterile fashion using maximum barrier technique (cap and mask, sterile gown, sterile gloves, large sterile sheet, hand hygiene and cutaneous antiseptic). Local  anesthesia was attained by infiltration with 1% lidocaine. Ultrasound demonstrated patency of the left basilic vein, and this was documented with an image. Under real-time ultrasound guidance, this vein was accessed with a 21 gauge micropuncture needle and image documentation was performed. The needle was exchanged over a guidewire for a peel-away sheath through which a 43 cm 5 [place] dual lumen power injectable PICC was advanced, and positioned with its tip at the lower SVC/right atrial junction. Fluoroscopy during the procedure and fluoro spot radiograph confirms appropriate catheter position. The catheter was flushed, secured to the skin, and covered with a sterile dressing. IMPRESSION: Successful placement of a left arm PICC with sonographic and fluoroscopic guidance. The catheter is ready for use. Electronically Signed   By: Markus Daft M.D.   On: 05/23/2015 09:26   Dg Chest Port 1 View  05/23/2015  CLINICAL DATA:  History of cardiac failure. Left ventricular assist device in place. EXAM: PORTABLE CHEST 1 VIEW COMPARISON:  Single view of the chest 05/20/2015 and 05/19/2015. FINDINGS: Since the prior studies, there has been increase in bilateral pleural effusions, larger on the left. Scattered  atelectasis in the left chest again seen. No pneumothorax is identified. There is cardiomegaly and vascular congestion. Left subclavian central venous catheter, AICD and left ventricular assist device remain in place. IMPRESSION: Small to moderate bilateral pleural effusions, larger on the left. Cardiomegaly and vascular congestion. Electronically Signed   By: Inge Rise M.D.   On: 05/23/2015 07:42     Medications:     Scheduled Medications: . amiodarone  400 mg Oral BID  . antiseptic oral rinse  7 mL Mouth Rinse BID  . aspirin EC  81 mg Oral Daily  . bisacodyl  10 mg Oral Daily   Or  . bisacodyl  10 mg Rectal Daily  . chlorpheniramine-HYDROcodone  5 mL Oral QHS  . citalopram  20 mg Oral Daily  . digoxin  0.125 mg Oral Daily  . docusate sodium  200 mg Oral Daily  . feeding supplement  1 Container Oral TID BM  . ferrous [id number] C-folic acid  1 capsule Oral TID PC  . fluconazole (DIFLUCAN) IV  100 mg Intravenous Q24H  . fluticasone  1 spray Each Nare Daily  . furosemide  40 mg Oral Daily  . guaiFENesin  600 mg Oral BID  . imipenem-cilastatin  500 mg Intravenous Q6H  . lidocaine      . loratadine  10 mg Oral Daily  . megestrol  20 mg Oral Daily  . montelukast  10 mg Oral QHS  . pantoprazole  40 mg Oral Daily  . sildenafil  20 mg Oral TID  . sodium chloride flush  10 mL Intravenous Q12H  . sodium chloride flush  3 mL Intravenous Q12H  . vancomycin  1,000 mg Intravenous Q12H  . Warfarin - Pharmacist Dosing Inpatient   Does not apply q1800    Infusions: . sodium chloride 10 mL/hr at 05/23/15 0700  . sodium chloride    . sodium chloride    . milrinone      PRN Medications: hydrALAZINE, ondansetron (ZOFRAN) IV, oxyCODONE, sodium chloride flush, sodium chloride flush, sodium chloride flush, traMADol, zolpidem   Assessment:   1. Nonischemic cardiomyopathy: Long-standing, EF 20-25%.  Coronary angiography this admission with no significant CAD. Cardiogenic  shock requiring milrinone and norepinephrine, IABP placed. Heartmate II LVAD placed 05/10/15.  2. Post-op blood loss anemia: 2 units PRBCs 1/31.  3. Elevate bilirubin: Noted pre-op, thought due to  RV failure.  Higher post-op with jaundice.  Elevated direct bilirubin, so liver source rather than hemolysis.  4. Atrial flutter: Resolved on amiodarone.  5. Hyponatremia 6. Atrial fibrillation with RVR 2/7.  TEE-guided DCCV 05/18/15.  7. RV failure: Revatio begun 2/7.  8. Small left apical PTX.  9. Elevated WBCs: Empiric vancomycin/ceftazidime and fluconazole added.  Bibasilar opacity on CXR.   Plan/Discussion:    ECHO with severe RV dysfunction and mod pericardial effusion. Continue sildenafil 20 mg tid, digoxin 0.125, and milrinone 0.125 mcg/kg/min.  Co-ox marginal but acceptable for now at 56% today.  Will not try to wean off milrinone at this time. He will need home milrinone. PICC in place. Remove subclavian central line.   S/P TEE successful DC-CV 2/8. Based on TEE, speed increased to 9200.  HR 90-100s, remains on amiodarone (now po).     MAP stable.  CVP 6, getting Lasix 40 mg once daily.  With RV failure will need to run CVP higher, aim around 10.  Will make Lasix qod and not give tomorrow.      INR today 2.11, hemoglobin stable.  Continue ASA 81 mg daily.   LDH up a little 258>281    WBCs down a little 18.8.  CXR persistent bibasilar opacities.  On broad spectrum antibiotics vanco/primaxin/ fluconazole.  Sent cultures, blood cultures NGTD.  PICC in place, central line to be removed.  Does not seen to be having much drainage now from driveline site.  Total bilirubin 5.3.  Elevation pre-op thought due to CHF/RV failure, more marked post-op. Normal alkaline phosphatase.  LDH stable 249>290>224>241>258>281 . Elevated direct bilirubin suggests liver source.   I reviewed the LVAD parameters from today, and compared the results to the patient's prior recorded data.  No programming changes were made.   The LVAD is functioning within specified parameters.  The patient performs LVAD self-test daily.  LVAD interrogation was negative for any significant power changes, alarms or PI events/speed drops.  LVAD equipment check completed and is in good working order.  Back-up equipment present.   LVAD education done on emergency procedures and precautions and reviewed exit site care.  Length of Stay: 24  Amy Clegg   NP-C  05/23/2015, 9:56 AM  VAD Team --- VAD ISSUES ONLY--- Pager [phone] (7am - 7am)  Advanced Heart Failure Team  Pager [phone] (M-F; 7a - 4p)  Please contact [facility] for night-coverage after hours (4p -7a ) and weekends on [email or web address]  Patient seen with NP, agree with the above note.  Slow progress.  Walked twice yesterday.  Now with pain right lateral chest that has started after a lot of coughing.  Muscle strain versus rib fracture.  Need to keep cough suppressed.   Will aim for higher CVP with RV failure, ok for about 10.  Will make Lasix every other day.  Continue milrinone gtt, may need at home given marginal co-ox.  Continue digoxin, check level.    I think he is ready for step-down.  Will look into possibility of 2H (d/w Dr Prescott Gum).   [name] 05/23/2015 10:41 AM

## 2015-05-23 NOTE — Procedures (Signed)
Placement of left arm PICC. PICC placed in left basilic vein.  Length = 43 cm.  No immediate complication. Tip at SVC/RA junction.

## 2015-05-23 NOTE — Progress Notes (Signed)
OT Cancellation Note  Patient Details Name: Patrick Humphrey MRN: [id number] DOB: 30-Jul-1965   Cancelled Treatment:    Reason Eval/Treat Not Completed: Patient at procedure or test/ unavailable - will reattempt.   Darlina Rumpf [place], OTR/L [id number]   05/23/2015, 9:47 AM

## 2015-05-24 ENCOUNTER — Inpatient Hospital Stay (HOSPITAL_COMMUNITY): Payer: Medicare Other

## 2015-05-24 LAB — CARBOXYHEMOGLOBIN
Carboxyhemoglobin: 2.9 % — ABNORMAL HIGH (ref 0.5–1.5)
Methemoglobin: 0.9 % (ref 0.0–1.5)
O2 Saturation: 63.6 %
Total hemoglobin: 9.7 g/dL — ABNORMAL LOW (ref 13.5–18.0)

## 2015-05-24 LAB — COMPREHENSIVE METABOLIC PANEL
ALT: 33 U/L (ref 17–63)
AST: 63 U/L — ABNORMAL HIGH (ref 15–41)
Albumin: 2.4 g/dL — ABNORMAL LOW (ref 3.5–5.0)
Alkaline Phosphatase: 120 U/L (ref 38–126)
Anion gap: 10 (ref 5–15)
BUN: 7 mg/dL (ref 6–20)
CO2: 23 mmol/L (ref 22–32)
Calcium: 8.3 mg/dL — ABNORMAL LOW (ref 8.9–10.3)
Chloride: 95 mmol/L — ABNORMAL LOW (ref 101–111)
Creatinine, Ser: 0.6 mg/dL — ABNORMAL LOW (ref 0.61–1.24)
GFR calc Af Amer: >60 mL/min (ref >60)
GFR calc non Af Amer: >60 mL/min (ref >60)
Glucose, Bld: 100 mg/dL — ABNORMAL HIGH (ref 65–99)
Potassium: 3.4 mmol/L — ABNORMAL LOW (ref 3.5–5.1)
Sodium: 128 mmol/L — ABNORMAL LOW (ref 135–145)
Total Bilirubin: 4.3 mg/dL — ABNORMAL HIGH (ref 0.3–1.2)
Total Protein: 5.9 g/dL — ABNORMAL LOW (ref 6.5–8.1)

## 2015-05-24 LAB — CBC
HCT: 28.7 % — ABNORMAL LOW (ref 39.0–52.0)
Hemoglobin: 9.9 g/dL — ABNORMAL LOW (ref 13.0–17.0)
MCH: 33.6 pg (ref 26.0–34.0)
MCHC: 34.5 g/dL (ref 30.0–36.0)
MCV: 97.3 fL (ref 78.0–100.0)
Platelets: 380 10*3/uL (ref 150–400)
RBC: 2.95 MIL/uL — ABNORMAL LOW (ref 4.22–5.81)
RDW: 17.5 % — ABNORMAL HIGH (ref 11.5–15.5)
WBC: 16.1 10*3/uL — ABNORMAL HIGH (ref 4.0–10.5)

## 2015-05-24 LAB — DIGOXIN LEVEL: Digoxin Level: 0.3 ng/mL — ABNORMAL LOW (ref 0.8–2.0)

## 2015-05-24 LAB — PROTIME-INR
INR: 2.29 — ABNORMAL HIGH (ref 0.00–1.49)
Prothrombin Time: 25 seconds — ABNORMAL HIGH (ref 11.6–15.2)

## 2015-05-24 LAB — LACTATE DEHYDROGENASE: LDH: 297 U/L — ABNORMAL HIGH (ref 98–192)

## 2015-05-24 MED ORDER — FLUCONAZOLE 100 MG PO TABS
100.0000 mg | ORAL_TABLET | Freq: Every day | ORAL | Status: AC
  Administered 2015-05-25: 100 mg via ORAL
  Filled 2015-05-24: qty 1

## 2015-05-24 MED ORDER — SPIRONOLACTONE 25 MG PO TABS
12.5000 mg | ORAL_TABLET | Freq: Every day | ORAL | Status: DC
  Administered 2015-05-24 – 2015-05-25 (×2): 12.5 mg via ORAL
  Filled 2015-05-24 (×2): qty 1

## 2015-05-24 MED ORDER — SORBITOL 70 % SOLN
30.0000 mL | Freq: Every day | Status: DC
  Administered 2015-05-24: 30 mL via ORAL
  Filled 2015-05-24 (×2): qty 30

## 2015-05-24 MED ORDER — GUAIFENESIN-DM 100-10 MG/5ML PO SYRP
5.0000 mL | ORAL_SOLUTION | Freq: Every day | ORAL | Status: DC
  Administered 2015-05-24 – 2015-05-29 (×4): 5 mL via ORAL
  Filled 2015-05-24 (×5): qty 5

## 2015-05-24 MED ORDER — POTASSIUM CHLORIDE CRYS ER 20 MEQ PO TBCR
40.0000 meq | EXTENDED_RELEASE_TABLET | Freq: Once | ORAL | Status: AC
  Administered 2015-05-24: 40 meq via ORAL
  Filled 2015-05-24: qty 2

## 2015-05-24 MED ORDER — WARFARIN SODIUM 2 MG PO TABS
2.0000 mg | ORAL_TABLET | Freq: Once | ORAL | Status: AC
  Administered 2015-05-24: 2 mg via ORAL
  Filled 2015-05-24: qty 1

## 2015-05-24 NOTE — Progress Notes (Signed)
CSW met with patient and wife at bedside. Patient shared progress and moving in the "right direction". Wife was appreciative of anniversary celebration this past weekend from staff on 2S. Patient feels improvement and states possibly out to regular floor soon. CSW will continue to follow for support throughout recovery. Raquel Sarna, [place]

## 2015-05-24 NOTE — Progress Notes (Signed)
CSW attempted to visit patient at bedside although patient was not available at the time. CSW will continue to monitor and support throughout recovery. Patrick Humphrey, [name]

## 2015-05-24 NOTE — Progress Notes (Signed)
Patient ID: Patrick Humphrey, male   DOB: 14-Feb-1966, 50 y.o.   MRN: [id number] HeartMate 2 Rounding Note  Subjective:    LVAD placement 05/10/15.   Extubated 2/1.  Got 2 units PRBCs 1/31. Today's Hgb 9.4.    05/18/15 S/P TEE and successful DC-CV. Left  ventricle was mildly dilated with EF 15% and septal akinesis. The LVAD inflow cannula was visualized at the apex. The RV with mildly dilated with severe systolic dysfunction. Pacemaker in RV. There was trivial TR. The aortic valve did not open. There was mild aortic insufficiency. Trivial TR. Mildly dilated LA with no LA appendage thrombus. Normal RA. Moderate pericardial effusion along the LV lateral wall, no tamponade. LVAD speed increased to 9200.   He is jaundiced, total bilirubin increased.  Pre-op 4.2 => post-op 9.8 => 8.6 => 7.4 => 6.2 => 6.3 => 5.2--> 5.7 --> 4.3 --> 4.4 --> 5.5-->5.3-->4.3.   (elevated direct bilirubin as well).   CVP 9-10. Remains in NSR today.  He had some serosanguineous drainage from his driveline site.  WBCs to 18.8 .  Blood - NGTD. Urine culture negative. He is on broad spectrum abx, no fever   Overall feeling better. Denies SOB. Pain controlled.    LVAD INTERROGATION:  HeartMate II LVAD:  Flow 4.6 liters/min, speed 9200,  power 5, PI 7.1.  2 PI events over over night.   Objective:    Vital Signs:   Temp:  [96.8 F (36 C)-98.6 F (37 C)] 98.6 F (37 C) (02/14 0731) Pulse Rate:  [77-104] 95 (02/14 0700) Resp:  [18-30] 18 (02/14 0700) BP: (89-110)/(66-89) 97/83 mmHg (02/14 0700) SpO2:  [89 %-100 %] 99 % (02/14 0700) Weight:  [175 lb 4.3 oz (79.5 kg)] 175 lb 4.3 oz (79.5 kg) (02/14 0500) Last BM Date: 05/22/15 Mean arterial Pressure 80-90s    Intake/Output:   Intake/Output Summary (Last 24 hours) at 05/24/15 0821 Last data filed at 05/24/15 0700  Gross per 24 hour  Intake   1593 ml  Output   1600 ml  Net     -7 ml     Physical Exam: CVP 9-10 General:  NAD. No resp difficulty. In the  chair.    HEENT: normal Neck: supple. JVP 9-10 . Carotids 2+ bilat; no bruits. No lymphadenopathy or thryomegaly appreciated.  Cor: Mechanical heart sounds with LVAD hum present. Lungs: Decreased in the bases.  Abdomen: soft, nontender, nondistended. No hepatosplenomegaly. No bruits or masses. Good bowel sounds. Driveline: C/D/I; securement device intact and driveline incorporated Extremities: no cyanosis, clubbing, rash, R and LLE ted hose. R and L ankle trace edema. LUE PICC.  Neuro: alert & orientedx3, cranial nerves grossly intact. moves all 4 extremities w/o difficulty. Affect pleasant  Telemetry: SR 90-100s     Labs: Basic Metabolic Panel:  Recent Labs Lab 05/18/15 0450  05/20/15 0620 05/21/15 0420 05/22/15 0503 05/23/15 0403 05/24/15 0600  NA  --   < > 126* 126* 126* 125* 128*  K  --   < > 3.4* 4.0 3.4* 3.4* 3.4*  CL  --   < > 90* 91* 91* 92* 95*  CO2  --   < > 28 26 23 23 23   GLUCOSE  --   < > 91 92 83 154* 100*  BUN  --   < > 10 8 9 8 7   CREATININE  --   < > 0.73 0.55* 0.63 0.60* 0.60*  CALCIUM  --   < > 8.3* 8.2* 8.1* 7.8*  8.3*  MG 2.0  --   --   --   --   --   --   < > = values in this interval not displayed.  Liver Function Tests:  Recent Labs Lab 05/20/15 0620 05/21/15 0420 05/22/15 0503 05/23/15 0403 05/24/15 0600  AST 53* 64* 59* 65* 63*  ALT 24 29 33 35 33  ALKPHOS 102 116 131* 132* 120  BILITOT 4.3* 4.4* 5.5* 5.3* 4.3*  PROT 5.6* 5.4* 5.2* 5.3* 5.9*  ALBUMIN 2.4* 2.6* 2.4* 2.2* 2.4*   No results for input(s): LIPASE, AMYLASE in the last 168 hours. No results for input(s): AMMONIA in the last 168 hours.  CBC:  Recent Labs Lab 05/20/15 0620 05/21/15 0420 05/22/15 0503 05/23/15 0403 05/24/15 0600  WBC 16.0* 19.0* 20.7* 18.8* 16.1*  HGB 9.4* 9.5* 9.4* 9.2* 9.9*  HCT 27.8* 28.6* 28.3* 27.7* 28.7*  MCV 93.9 94.4 95.3 95.5 97.3  PLT 374 416* 408* 418* 380    INR:  Recent Labs Lab 05/20/15 0620 05/21/15 0420 05/22/15 0503  05/23/15 0403 05/24/15 0600  INR 2.71* 2.19* 2.08* 2.11* 2.29*    Other results:  EKG:   Imaging: Ir Fluoro Guide Cv Line Left  05/23/2015  INDICATION: 50 year old with nonischemic cardiomyopathy and severe RV dysfunction. Central venous access needed for medications. EXAM: PICC LINE PLACEMENT WITH ULTRASOUND AND FLUOROSCOPIC GUIDANCE MEDICATIONS: None ANESTHESIA/SEDATION: None FLUOROSCOPY TIME:  Fluoroscopy Time: 1 minutes and 12 seconds. 2.5 mGy COMPLICATIONS: None immediate. PROCEDURE: The patient was advised of the possible risks and complications and agreed to undergo the procedure. The patient was then brought to the angiographic suite for the procedure. The left arm was prepped with chlorhexidine, draped in the usual sterile fashion using maximum barrier technique (cap and mask, sterile gown, sterile gloves, large sterile sheet, hand hygiene and cutaneous antiseptic). Local anesthesia was attained by infiltration with 1% lidocaine. Ultrasound demonstrated patency of the left basilic vein, and this was documented with an image. Under real-time ultrasound guidance, this vein was accessed with a 21 gauge micropuncture needle and image documentation was performed. The needle was exchanged over a guidewire for a peel-away sheath through which a 43 cm 5 [place] dual lumen power injectable PICC was advanced, and positioned with its tip at the lower SVC/right atrial junction. Fluoroscopy during the procedure and fluoro spot radiograph confirms appropriate catheter position. The catheter was flushed, secured to the skin, and covered with a sterile dressing. IMPRESSION: Successful placement of a left arm PICC with sonographic and fluoroscopic guidance. The catheter is ready for use. Electronically Signed   By: Markus Daft M.D.   On: 05/23/2015 09:26   Ir US Guide Vasc Access Left  05/23/2015  INDICATION: 50 year old with nonischemic cardiomyopathy and severe RV dysfunction. Central venous access needed for  medications. EXAM: PICC LINE PLACEMENT WITH ULTRASOUND AND FLUOROSCOPIC GUIDANCE MEDICATIONS: None ANESTHESIA/SEDATION: None FLUOROSCOPY TIME:  Fluoroscopy Time: 1 minutes and 12 seconds. 2.5 mGy COMPLICATIONS: None immediate. PROCEDURE: The patient was advised of the possible risks and complications and agreed to undergo the procedure. The patient was then brought to the angiographic suite for the procedure. The left arm was prepped with chlorhexidine, draped in the usual sterile fashion using maximum barrier technique (cap and mask, sterile gown, sterile gloves, large sterile sheet, hand hygiene and cutaneous antiseptic). Local anesthesia was attained by infiltration with 1% lidocaine. Ultrasound demonstrated patency of the left basilic vein, and this was documented with an image. Under real-time ultrasound guidance, this vein was  accessed with a 21 gauge micropuncture needle and image documentation was performed. The needle was exchanged over a guidewire for a peel-away sheath through which a 43 cm 5 [place] dual lumen power injectable PICC was advanced, and positioned with its tip at the lower SVC/right atrial junction. Fluoroscopy during the procedure and fluoro spot radiograph confirms appropriate catheter position. The catheter was flushed, secured to the skin, and covered with a sterile dressing. IMPRESSION: Successful placement of a left arm PICC with sonographic and fluoroscopic guidance. The catheter is ready for use. Electronically Signed   By: Markus Daft M.D.   On: 05/23/2015 09:26   Dg Chest Port 1 View  05/24/2015  CLINICAL DATA:  Left ventricular assist device EXAM: PORTABLE CHEST 1 VIEW COMPARISON:  Portable chest x-ray of May 23, 2015 and fluoro spot images of the same day. FINDINGS: There is a new approximately 10% left apical pneumothorax. There is stable increase retrocardiac density on the left as well as subsegmental atelectasis more peripherally. A trace of pleural fluid on the left is  present. There is subsegmental atelectasis at the right lung base and trace right pleural effusion. A left ventricular assist device is present and appears to be in stable position. The cardiac silhouette remains enlarged. The pulmonary vascularity is less engorged. The implantable pacemaker defibrillator is in stable position. The left-sided PICC line tip projects over the midportion of the SVC. IMPRESSION: 1. New 10% left apical pneumothorax. 2. Persistent cardiomegaly with mild central pulmonary vascular prominence but improved peripheral interstitial edema. The left ventricular assist device is in reasonable position. 3. Dense left basilar atelectasis or infiltrate. Mild subsegmental atelectasis at the right lung base. 4. No large pleural effusion. 5. Critical Value/emergent results were called by telephone at the time of interpretation on 05/24/2015 at 7:45 am to Benjaman Pott, RN, who verbally acknowledged these results. Electronically Signed   By: David  Martinique M.D.   On: 05/24/2015 07:47   Dg Chest Port 1 View  05/23/2015  CLINICAL DATA:  History of cardiac failure. Left ventricular assist device in place. EXAM: PORTABLE CHEST 1 VIEW COMPARISON:  Single view of the chest 05/20/2015 and 05/19/2015. FINDINGS: Since the prior studies, there has been increase in bilateral pleural effusions, larger on the left. Scattered atelectasis in the left chest again seen. No pneumothorax is identified. There is cardiomegaly and vascular congestion. Left subclavian central venous catheter, AICD and left ventricular assist device remain in place. IMPRESSION: Small to moderate bilateral pleural effusions, larger on the left. Cardiomegaly and vascular congestion. Electronically Signed   By: Inge Rise M.D.   On: 05/23/2015 07:42     Medications:     Scheduled Medications: . amiodarone  400 mg Oral BID  . antiseptic oral rinse  7 mL Mouth Rinse BID  . aspirin EC  81 mg Oral Daily  . benzonatate  100 mg Oral BID   . bisacodyl  10 mg Oral Daily   Or  . bisacodyl  10 mg Rectal Daily  . chlorpheniramine-HYDROcodone  5 mL Oral QHS  . citalopram  20 mg Oral Daily  . digoxin  0.125 mg Oral Daily  . docusate sodium  200 mg Oral Daily  . feeding supplement  1 Container Oral TID BM  . ferrous [id number] C-folic acid  1 capsule Oral TID PC  . fluconazole (DIFLUCAN) IV  100 mg Intravenous Q24H  . fluticasone  1 spray Each Nare Daily  . [START ON 05/25/2015] furosemide  40 mg Oral QODAY  .  guaiFENesin  600 mg Oral BID  . imipenem-cilastatin  500 mg Intravenous Q6H  . loratadine  10 mg Oral Daily  . megestrol  20 mg Oral Daily  . montelukast  10 mg Oral QHS  . pantoprazole  40 mg Oral Daily  . sildenafil  40 mg Oral TID  . sodium chloride flush  10 mL Intravenous Q12H  . sodium chloride flush  3 mL Intravenous Q12H  . vancomycin  1,000 mg Intravenous Q12H  . Warfarin - Pharmacist Dosing Inpatient   Does not apply q1800    Infusions: . sodium chloride 10 mL/hr at 05/24/15 0400  . sodium chloride    . milrinone 0.125 mcg/kg/min (05/24/15 0400)    PRN Medications: hydrALAZINE, ondansetron (ZOFRAN) IV, oxyCODONE, sodium chloride flush, sodium chloride flush, sodium chloride flush, traMADol, zolpidem   Assessment:   1. Nonischemic cardiomyopathy: Long-standing, EF 20-25%.  Coronary angiography this admission with no significant CAD. Cardiogenic shock requiring milrinone and norepinephrine, IABP placed. Heartmate II LVAD placed 05/10/15.  2. Post-op blood loss anemia: 2 units PRBCs 1/31.  3. Elevate bilirubin: Noted pre-op, thought due to RV failure.  Higher post-op with jaundice.  Elevated direct bilirubin, so liver source rather than hemolysis.  4. Atrial flutter: Resolved on amiodarone.  5. Hyponatremia 6. Atrial fibrillation with RVR 2/7.  TEE-guided DCCV 05/18/15.  7. RV failure: Revatio begun 2/7.  8. Small left apical PTX.  9. Elevated WBCs: Empiric vancomycin/ceftazidime and  fluconazole added.  Bibasilar opacity on CXR.   Plan/Discussion:    ECHO with severe RV dysfunction and mod pericardial effusion. Continue sildenafil 20 mg tid, digoxin 0.125, and milrinone 0.125 mcg/kg/min.  Co-ox stable-->64%. Will not try to wean off milrinone at this time. He will need home milrinone. PICC in place.   S/P TEE successful DC-CV 2/8. Based on TEE, speed increased to 9200.  HR 90-100s, remains on amiodarone (now po).     MAP elevated.  CVP 9-10. Continue lasix 40 mg every other day.   With RV failure will need to run CVP higher, aim around 10.  Add 12.5 mg spiro daily.   INR today 2.29, hemoglobin stable.  Continue ASA 81 mg daily.   LDH up a little 258>281>297     WBCs trending down 16.6.  CXR persistent bibasilar opacities.  On broad spectrum antibiotics vanco/primaxin/ fluconazole.  Sent cultures, blood cultures NGTD.  Urine culture negative.    Total bilirubin 4.3 .  Elevation pre-op thought due to CHF/RV failure, more marked post-op. Normal alkaline phosphatase.  LDH stable 297 . Elevated direct bilirubin suggests liver source.   I reviewed the LVAD parameters from today, and compared the results to the patient's prior recorded data.  No programming changes were made.  The LVAD is functioning within specified parameters.  The patient performs LVAD self-test daily.  LVAD interrogation was negative for any significant power changes, alarms or PI events/speed drops.  LVAD equipment check completed and is in good working order.  Back-up equipment present.   LVAD education done on emergency procedures and precautions and reviewed exit site care.  Length of Stay: 25  Amy Clegg   NP-C  05/24/2015, 8:21 AM  VAD Team --- VAD ISSUES ONLY--- Pager [phone] (7am - 7am)  Advanced Heart Failure Team  Pager [phone] (M-F; 7a - 4p)  Please contact [facility] for night-coverage after hours (4p -7a ) and weekends on [email or web address]  Patient seen with NP, agree with the above note.  Patrick  Humphrey seems  to be doing better today.  Did well walking yesterday.  Co-ox good, CVP 9-10 which is our goal for him with RV failure.   Continue current milrinone 0.125.    I will titrate Revatio up to 40 mg tid.   Continue qod Lasix.   Ramp echo tomorrow.    May go to step down.   [name] 05/24/2015 12:55 PM

## 2015-05-24 NOTE — Progress Notes (Signed)
Occupational Therapy Treatment Patient Details Name: Patrick Humphrey MRN: [id number] DOB: [birth year] Today's Date: 05/24/2015    History of present illness patient is a 50 yo male with nonischemic cardiomyopathy since 2002 with acute on chronic systolic heart failure and cardiogenic shock, deteriorating on 2 inotropes and had IABP placed. Moderate RV systolic dysfunction on preop echo.  s/p HeartMate II LVAD   OT comments  Pt fatigued following ambulation.  Focus of session on instruction in energy conservation, LB ADL and bed mobility adhering to sternal precautions.  Educated wife and pt in benefits of pt completing as much of ADL as he can, primarily in sitting, as a means of improving endurance and strength and fading the amount of assistance his wife is currently providing.  Pt and wife verbalizing understanding.  Follow Up Recommendations  Home health OT;Supervision/Assistance - 24 hour    Equipment Recommendations  None recommended by OT    Recommendations for Other Services      Precautions / Restrictions Precautions Precautions: Sternal;Fall Precaution Comments: sternal,LVAD precautions- black bag at all times       Mobility Bed Mobility Overal bed mobility: Needs Assistance Bed Mobility: Rolling;Sidelying to Sit;Sit to Sidelying Rolling: Min assist Sidelying to sit: Mod assist     Sit to sidelying: Min assist General bed mobility comments: Pt adhering to sternal precautions. Recommended pt consider a wedge pillow at home as he will not have the capability of elevating the [facility] when he goes home.  Transfers                      Balance     Sitting balance-Leahy Scale: Good       Standing balance-Leahy Scale: Fair                     ADL Overall ADL's : Needs assistance/impaired                     Lower Body Dressing: Minimal assistance;Sit to/from stand Lower Body Dressing Details (indicate cue type and reason): socks, slip on  shoes at EOB Toilet Transfer: Minimal assistance;Stand-pivot;BSC   Toileting- Clothing Manipulation and Hygiene: Minimal assistance;Sit to/from stand         General ADL Comments: Educated in energy conservation strategies.  Recommended use of BSC in front of sink for seated sponge bathing. Pt and wife much happier with new vest and belt for LVAD equipment.      Vision                     Perception     Praxis      Cognition   Behavior During Therapy: WFL for tasks assessed/performed Overall Cognitive Status: Within Functional Limits for tasks assessed                       Extremity/Trunk Assessment               Exercises     Shoulder Instructions       General Comments      Pertinent Vitals/ Pain       Pain Assessment: Faces Faces Pain Scale: Hurts little more Pain Location: R ribs Pain Descriptors / Indicators: Sore Pain Intervention(s): Monitored during session  Home Living  Prior Functioning/Environment              Frequency Min 2X/week     Progress Toward Goals  OT Goals(current goals can now be found in the care plan section)  Progress towards OT goals: Progressing toward goals  Acute Rehab OT Goals Patient Stated Goal: get home Time For Goal Achievement: 05/27/15 Potential to Achieve Goals: Good  Plan Discharge plan remains appropriate    Co-evaluation                 End of Session     Activity Tolerance Patient limited by fatigue   Patient Left in bed;with call bell/phone within reach;with family/visitor present   Nurse Communication          Time: [id number] OT Time Calculation (min): 17 min  Charges: OT General Charges $OT Visit: 1 Procedure OT Treatments $Self Care/Home Management : 8-22 mins  Malka So 05/24/2015, 3:38 PM [phone]

## 2015-05-24 NOTE — Progress Notes (Signed)
ANTICOAGULATION CONSULT NOTE - Follow Up Consult  Pharmacy Consult for Coumadin Indication: s/p LVAD  Patient Measurements: Height: 5\' 11"  (180.3 cm) Weight: 175 lb 4.3 oz (79.5 kg) IBW/kg (Calculated) : 75.3  Vital Signs: Temp: 98.6 F (37 C) (02/14 0731) Temp Source: Oral (02/14 0731) BP: 97/83 mmHg (02/14 0700) Pulse Rate: 95 (02/14 0700)  Labs:  Recent Labs  05/22/15 0503 05/23/15 0403 05/24/15 0600  HGB 9.4* 9.2* 9.9*  HCT 28.3* 27.7* 28.7*  PLT 408* 418* 380  LABPROT 23.2* 23.5* 25.0*  INR 2.08* 2.11* 2.29*  CREATININE 0.63 0.60* 0.60*    Estimated Creatinine Clearance: 117.7 mL/min (by C-G formula based on Cr of 0.6).   Assessment: 50 year old male with long-standing history of chronic systolic heart failure, nonischemic cardiomyopathy, ICD, Dr. Gwenlyn Found, who has been experiencing increasing dyspnea consistent with acute on chronic systolic heart failure.  LVAD 1/31  AC: heparin off post vad. INR 2.2 today, Cbc stable. Continue to monitor for drug interaction with Amio and new fluconazole  ID: no fevers, wbc stable 16(down), CXR persistent bibasilar opacities. Cover HCAP with vanco/ceftazidime (per MD)  Vanc 2/7>>  --2/10 VT -17 - no changes Fortaz 2/9>>2/12 Primaxin 2/12>> Fluconazole 2/11>2/15  1/29 mrsa pcr - neg 2/11 BC x 2>>ngtd 2/11 Urine - ngF  Goal of Therapy:  INR 2-3  Vancomycin level 10-20 Monitor platelets by anticoagulation protocol: Yes   Plan:  -Warfarin - 2mg  tonight -Vanco 1g IV q12h - check trough later this week -Primaxin 500mg  IV q6 hours -Continue IV abx until driveline site improves per surgery  Erin Hearing PharmD., BCPS Clinical Pharmacist Pager [phone] 05/24/2015 8:23 AM

## 2015-05-24 NOTE — Progress Notes (Signed)
[place] to see to walk but pt just back from walk with RN. Will continue to follow. Graylon Good RN BSN 05/24/2015 2:44 PM

## 2015-05-25 ENCOUNTER — Inpatient Hospital Stay (HOSPITAL_COMMUNITY): Payer: Medicare Other

## 2015-05-25 DIAGNOSIS — I313 Pericardial effusion (noninflammatory): Secondary | ICD-10-CM

## 2015-05-25 DIAGNOSIS — I5023 Acute on chronic systolic (congestive) heart failure: Secondary | ICD-10-CM

## 2015-05-25 DIAGNOSIS — Z95811 Presence of heart assist device: Secondary | ICD-10-CM

## 2015-05-25 LAB — CBC
HCT: 27.9 % — ABNORMAL LOW (ref 39.0–52.0)
Hemoglobin: 9.7 g/dL — ABNORMAL LOW (ref 13.0–17.0)
MCH: 33.9 pg (ref 26.0–34.0)
MCHC: 34.8 g/dL (ref 30.0–36.0)
MCV: 97.6 fL (ref 78.0–100.0)
Platelets: 349 10*3/uL (ref 150–400)
RBC: 2.86 MIL/uL — ABNORMAL LOW (ref 4.22–5.81)
RDW: 17.1 % — ABNORMAL HIGH (ref 11.5–15.5)
WBC: 13.5 10*3/uL — ABNORMAL HIGH (ref 4.0–10.5)

## 2015-05-25 LAB — COMPREHENSIVE METABOLIC PANEL
ALT: 32 U/L (ref 17–63)
AST: 60 U/L — ABNORMAL HIGH (ref 15–41)
Albumin: 2.4 g/dL — ABNORMAL LOW (ref 3.5–5.0)
Alkaline Phosphatase: 122 U/L (ref 38–126)
Anion gap: 8 (ref 5–15)
BUN: 6 mg/dL (ref 6–20)
CO2: 24 mmol/L (ref 22–32)
Calcium: 8.1 mg/dL — ABNORMAL LOW (ref 8.9–10.3)
Chloride: 96 mmol/L — ABNORMAL LOW (ref 101–111)
Creatinine, Ser: 0.54 mg/dL — ABNORMAL LOW (ref 0.61–1.24)
GFR calc Af Amer: >60 mL/min (ref >60)
GFR calc non Af Amer: >60 mL/min (ref >60)
Glucose, Bld: 96 mg/dL (ref 65–99)
Potassium: 3.8 mmol/L (ref 3.5–5.1)
Sodium: 128 mmol/L — ABNORMAL LOW (ref 135–145)
Total Bilirubin: 3.8 mg/dL — ABNORMAL HIGH (ref 0.3–1.2)
Total Protein: 5.7 g/dL — ABNORMAL LOW (ref 6.5–8.1)

## 2015-05-25 LAB — PROTIME-INR
INR: 2.65 — ABNORMAL HIGH (ref 0.00–1.49)
Prothrombin Time: 27.9 seconds — ABNORMAL HIGH (ref 11.6–15.2)

## 2015-05-25 LAB — LACTATE DEHYDROGENASE: LDH: 300 U/L — ABNORMAL HIGH (ref 98–192)

## 2015-05-25 LAB — CARBOXYHEMOGLOBIN
Carboxyhemoglobin: 2.5 % — ABNORMAL HIGH (ref 0.5–1.5)
Methemoglobin: 1 % (ref 0.0–1.5)
O2 Saturation: 54.7 %
Total hemoglobin: 9.7 g/dL — ABNORMAL LOW (ref 13.5–18.0)

## 2015-05-25 LAB — VANCOMYCIN, TROUGH: Vancomycin Tr: 14 ug/mL (ref 10.0–20.0)

## 2015-05-25 MED ORDER — FUROSEMIDE 10 MG/ML IJ SOLN
40.0000 mg | Freq: Once | INTRAMUSCULAR | Status: AC
  Administered 2015-05-25: 40 mg via INTRAVENOUS
  Filled 2015-05-25: qty 4

## 2015-05-25 MED ORDER — FUROSEMIDE 10 MG/ML IJ SOLN: 40.0000 mg | Freq: Every day | INTRAMUSCULAR | Status: DC

## 2015-05-25 MED ORDER — HYDROCODONE-ACETAMINOPHEN 5-325 MG PO TABS
1.0000 | ORAL_TABLET | ORAL | Status: DC | PRN
  Administered 2015-05-29: 1 via ORAL
  Filled 2015-05-25: qty 1

## 2015-05-25 MED ORDER — POTASSIUM CHLORIDE CRYS ER 20 MEQ PO TBCR
40.0000 meq | EXTENDED_RELEASE_TABLET | Freq: Once | ORAL | Status: AC
  Administered 2015-05-25: 40 meq via ORAL
  Filled 2015-05-25: qty 2

## 2015-05-25 MED ORDER — WARFARIN SODIUM 1 MG PO TABS
1.0000 mg | ORAL_TABLET | Freq: Once | ORAL | Status: AC
  Administered 2015-05-25: 1 mg via ORAL
  Filled 2015-05-25: qty 1

## 2015-05-25 NOTE — Progress Notes (Signed)
Echocardiogram 2D Echocardiogram has been performed.  Joelene Millin 05/25/2015, 3:51 PM

## 2015-05-25 NOTE — Progress Notes (Signed)
HeartMate 2 Rounding Note     Postop day #15 HeartMate 2 LVAD implantation   Subjective:   History of nonischemic cardiomyopathy, ejection fraction 15% Preoperative cardiogenic shock on balloon pump and dual inotropes Preoperative protein deficiency malnutrition with prealbumin 8.4 Preoperative moderate RV dysfunction Preoperative hepatic insufficiency with bilirubin of 4.2 Status post AICD implantation 2002  Postop a-fib resolved on amio, EPWs removed Postop driveline site infection- cellulitis with elevated WBC- improved on iv vanc and Primaxin Postop jaundice- bilirubin  now 3.0 Postop nausea, poor appetite- better off po iron Walking again with PT  Postop RV failure better with .125 mil , revatio and nsr - CVP 10  He has postoperative mild posterior pericardial effusion probably related to his poor nutritional status and reduction in LV size with VAD- repeat echo to assess  LVAD INTERROGATION:  HeartMate II LVAD:  Flow 5.1 liters/min, speed 9200, power 5.8 PI 6.8  Controller  intact   Objective:    Vital Signs:   Temp:  [97.7 F (36.5 C)-98.1 F (36.7 C)] 97.7 F (36.5 C) (02/15 0735) Pulse Rate:  [93-128] 105 (02/15 0900) Resp:  [19-48] 48 (02/15 0900) BP: (92-123)/(72-101) 118/101 mmHg (02/15 0900) SpO2:  [88 %-100 %] 96 % (02/15 0900) Weight:  [173 lb 11.6 oz (78.8 kg)] 173 lb 11.6 oz (78.8 kg) (02/15 0500) Last BM Date: 05/24/15 Mean arterial Pressure  75-85  Intake/Output:   Intake/Output Summary (Last 24 hours) at 05/25/15 0934 Last data filed at 05/25/15 0900  Gross per 24 hour  Intake    869 ml  Output    825 ml  Net     44 ml     Physical Exam: General:  Well appearing. No resp difficulty, jaundiced skin improving HEENT: normal Neck: supple. JVP . Carotids 2+ bilat; no bruits. No lymphadenopathy or thryomegaly appreciated. Cor: Mechanical heart sounds with LVAD hum present. Lungs: clear Abdomen: soft, nontender, nondistended. No hepatosplenomegaly.  No bruits or masses. Good bowel sounds. Extremities: no cyanosis, clubbing, rash, edema Neuro: alert & orientedx3, cranial nerves grossly intact. moves all 4 extremities w/o difficulty. Affect pleasant  Telemetry: sinus tach  Labs: Basic Metabolic Panel:  Recent Labs Lab 05/21/15 0420 05/22/15 0503 05/23/15 0403 05/24/15 0600 05/25/15 0440  NA 126* 126* 125* 128* 128*  K 4.0 3.4* 3.4* 3.4* 3.8  CL 91* 91* 92* 95* 96*  CO2 26 23 23 23 24   GLUCOSE 92 83 154* 100* 96  BUN 8 9 8 7 6   CREATININE 0.55* 0.63 0.60* 0.60* 0.54*  CALCIUM 8.2* 8.1* 7.8* 8.3* 8.1*    Liver Function Tests:  Recent Labs Lab 05/21/15 0420 05/22/15 0503 05/23/15 0403 05/24/15 0600 05/25/15 0440  AST 64* 59* 65* 63* 60*  ALT 29 33 35 33 32  ALKPHOS 116 131* 132* 120 122  BILITOT 4.4* 5.5* 5.3* 4.3* 3.8*  PROT 5.4* 5.2* 5.3* 5.9* 5.7*  ALBUMIN 2.6* 2.4* 2.2* 2.4* 2.4*   No results for input(s): LIPASE, AMYLASE in the last 168 hours. No results for input(s): AMMONIA in the last 168 hours.  CBC:  Recent Labs Lab 05/21/15 0420 05/22/15 0503 05/23/15 0403 05/24/15 0600 05/25/15 0440  WBC 19.0* 20.7* 18.8* 16.1* 13.5*  HGB 9.5* 9.4* 9.2* 9.9* 9.7*  HCT 28.6* 28.3* 27.7* 28.7* 27.9*  MCV 94.4 95.3 95.5 97.3 97.6  PLT 416* 408* 418* 380 349    INR:  Recent Labs Lab 05/21/15 0420 05/22/15 0503 05/23/15 0403 05/24/15 0600 05/25/15 0440  INR 2.19* 2.08* 2.11*  2.29* 2.65*    Other results:  EKG:   Imaging: Dg Chest Port 1 View  05/25/2015  CLINICAL DATA:  Left ventricular assist device placement EXAM: PORTABLE CHEST 1 VIEW COMPARISON:  May 24, 2015 FINDINGS: Central catheter tip is at the cavoatrial junction. The small pneumothorax noted on the left 1 day prior is not appreciable currently. Left ventricular assist device is present. Pacemaker lead attached to the right ventricle, stable. There is cardiomegaly with pulmonary venous hypertension. There are bilateral pleural  effusions with mild interstitial edema. There is patchy atelectasis in both mid lung regions as well as consolidation in the left base. IMPRESSION: The small pneumothorax noted 1 day prior on the left is not appreciable on this current examination. Evidence of a degree of congestive heart failure. Cannot exclude superimposed pneumonia left lower lobe. No change in cardiac silhouette. Electronically Signed   By: Lowella Grip III M.D.   On: 05/25/2015 07:45   Dg Chest Port 1 View  05/24/2015  CLINICAL DATA:  Left ventricular assist device EXAM: PORTABLE CHEST 1 VIEW COMPARISON:  Portable chest x-ray of May 23, 2015 and fluoro spot images of the same day. FINDINGS: There is a new approximately 10% left apical pneumothorax. There is stable increase retrocardiac density on the left as well as subsegmental atelectasis more peripherally. A trace of pleural fluid on the left is present. There is subsegmental atelectasis at the right lung base and trace right pleural effusion. A left ventricular assist device is present and appears to be in stable position. The cardiac silhouette remains enlarged. The pulmonary vascularity is less engorged. The implantable pacemaker defibrillator is in stable position. The left-sided PICC line tip projects over the midportion of the SVC. IMPRESSION: 1. New 10% left apical pneumothorax. 2. Persistent cardiomegaly with mild central pulmonary vascular prominence but improved peripheral interstitial edema. The left ventricular assist device is in reasonable position. 3. Dense left basilar atelectasis or infiltrate. Mild subsegmental atelectasis at the right lung base. 4. No large pleural effusion. 5. Critical Value/emergent results were called by telephone at the time of interpretation on 05/24/2015 at 7:45 am to Benjaman Pott, RN, who verbally acknowledged these results. Electronically Signed   By: David  Martinique M.D.   On: 05/24/2015 07:47     Medications:     Scheduled  Medications: . amiodarone  400 mg Oral BID  . antiseptic oral rinse  7 mL Mouth Rinse BID  . aspirin EC  81 mg Oral Daily  . benzonatate  100 mg Oral BID  . bisacodyl  10 mg Oral Daily   Or  . bisacodyl  10 mg Rectal Daily  . citalopram  20 mg Oral Daily  . digoxin  0.125 mg Oral Daily  . docusate sodium  200 mg Oral Daily  . feeding supplement  1 Container Oral TID BM  . fluconazole  100 mg Oral Daily  . fluticasone  1 spray Each Nare Daily  . furosemide  40 mg Oral QODAY  . guaiFENesin  600 mg Oral BID  . guaiFENesin-dextromethorphan  5 mL Oral QHS  . imipenem-cilastatin  500 mg Intravenous Q6H  . loratadine  10 mg Oral Daily  . megestrol  20 mg Oral Daily  . montelukast  10 mg Oral QHS  . pantoprazole  40 mg Oral Daily  . sildenafil  40 mg Oral TID  . sodium chloride flush  10 mL Intravenous Q12H  . sodium chloride flush  3 mL Intravenous Q12H  . spironolactone  12.5 mg Oral Daily  . vancomycin  1,000 mg Intravenous Q12H  . warfarin  1 mg Oral ONCE-1800  . Warfarin - Pharmacist Dosing Inpatient   Does not apply q1800    Infusions: . sodium chloride 10 mL/hr at 05/25/15 0800  . sodium chloride    . milrinone 0.125 mcg/kg/min (05/25/15 0800)    PRN Medications: hydrALAZINE, ondansetron (ZOFRAN) IV, sodium chloride flush, sodium chloride flush, sodium chloride flush, zolpidem   Assessment:  IR tunneled PICC line placed and triple-lumen central line removed Elevated white count probably due to infection-cellulitis around driveline exit site. Continue IV antibiotics, daily dressing change  Postoperative jaundice slowly improving bilirubin <4  Main surgical incisions appear to be healing. Ready for transfer to stepdown  Plan/Discussion:   Continue diuresis with lasix QOD  Continue IV antibiotics until driveline exit site improves    Plan repeat 2-D echocardiogram later to assess RV function and pericardial effusion   Continue with physical therapy and VAD equipment  education    I reviewed the LVAD parameters from today, and compared the results to the patient's prior recorded data.  No programming changes were made.  The LVAD is functioning within specified parameters.  The patient performs LVAD self-test daily.  LVAD interrogation was negative for any significant power changes, alarms or PI events/speed drops.  LVAD equipment check completed and is in good working order.  Back-up equipment present.   LVAD education done on emergency procedures and precautions and reviewed exit site care.  Length of Stay: Kidron III 05/25/2015, 9:34 AM

## 2015-05-25 NOTE — Progress Notes (Addendum)
ANTICOAGULATION CONSULT NOTE - Follow Up Consult  Pharmacy Consult for Coumadin Indication: s/p LVAD  Patient Measurements: Height: 5\' 11"  (180.3 cm) Weight: 173 lb 11.6 oz (78.8 kg) IBW/kg (Calculated) : 75.3  Vital Signs: Temp: 97.7 F (36.5 C) (02/15 0735) Temp Source: Oral (02/15 0735) BP: 123/96 mmHg (02/15 0800) Pulse Rate: 102 (02/15 0800)  Labs:  Recent Labs  05/23/15 0403 05/24/15 0600 05/25/15 0440  HGB 9.2* 9.9* 9.7*  HCT 27.7* 28.7* 27.9*  PLT 418* 380 349  LABPROT 23.5* 25.0* 27.9*  INR 2.11* 2.29* 2.65*  CREATININE 0.60* 0.60* 0.54*    Estimated Creatinine Clearance: 117.7 mL/min (by C-G formula based on Cr of 0.54).   Assessment: 50 year old male with long-standing history of chronic systolic heart failure, nonischemic cardiomyopathy, ICD, Dr. Gwenlyn Found, who has been experiencing increasing dyspnea consistent with acute on chronic systolic heart failure.  LVAD 1/31  AC: heparin off post vad. INR 2.6 today, Cbc stable. Continue to monitor for drug interaction with Amio and new fluconazole (stopping today)  ID: no fevers, wbc stable 13(down),CXR persistent bibasilar opacities. Cover HCAP with vanco/ceftazidime (per MD). Vancomycin trough still within desired goal, will continue without adjustment.  2/10 VT -17 - no changes 2/15 VT - 14 - no changes  Vanc 2/7>> Fortaz 2/9>>2/12 Primaxin 2/12>> Fluconazole 2/11>2/15  1/29 mrsa pcr - neg 2/11 BC x 2>>ngtd 2/11 Urine - ngF 2/14 wound (driveline) - ngtd  Goal of Therapy:  INR 2-2.5  Vancomycin level 10-20 Monitor platelets by anticoagulation protocol: Yes   Plan:  -Warfarin - 1mg  tonight -Vancomycin 1g q12 for now - trough today -Continue Primaxin 500mg  IV q6 hours -Continue IV abx until driveline site improves per surgery  Erin Hearing PharmD., BCPS Clinical Pharmacist Pager [phone] 05/25/2015 10:32 AM

## 2015-05-25 NOTE — Progress Notes (Signed)
PT Cancellation Note  Patient Details Name: Patrick Humphrey MRN: [id number] DOB: 1965/10/23   Cancelled Treatment:    Reason Eval/Treat Not Completed: Fatigue/lethargy limiting ability to participate. Pt states that he is very fatigued this morning and for best results, wishes to wait until later today. He states that he has not slept well and has been kept up all night by nightmares and hallucinations. Pt was very restless and was having mild hallucinations upon entry to room. Nursing agrees for PT to try back later. Will follow up.   Colon Branch, SPT Colon Branch 05/25/2015, 9:07 AM

## 2015-05-25 NOTE — Progress Notes (Signed)
CARDIAC REHAB PHASE I   Came to ambulate with pt. Pt states he just finished walking with PT (note not yet in computer).  Pt up in chair, eating lunch. Will continue to follow.    Lenna Sciara, RN, BSN 05/25/2015 1:15 PM

## 2015-05-25 NOTE — Progress Notes (Signed)
Speed  Flow  PI  Power  LVIDD  AI  Aortic openings  MR  TR  Septum  RV BP  9200 4.4 7.2 5.2w 5.44 cm Mild - Mod  5/5 Trivial  Trivial Midline to L bow Moderate reduced  101/86 (92)   9400  5.0 7.1 5.1w 5.2 cm  Mild - Mod 5/5 smaller openings   Midline  To L bow   89/75 (82)  9600  5.1 6.6 5.2w 4.9 cm     Midline occ bounce to L  96/82 (88)                   Ramp ECHO performed at bedside with Dr. Aundra Dubin and Janene Madeira. Patient with post-op RV dysfunction requiring re-initiation of milrinone and up-titration of Sildenafil to 40 mg TID. RV looks improved today with current therapy. Consider D/C of Milrinone tomorrow.   At completion of ramp study, patients primary and back up controller programmed:   Fixed speed: 9400 Low speed limit: 8800

## 2015-05-25 NOTE — Progress Notes (Signed)
Physical Therapy Treatment Patient Details Name: Patrick Humphrey MRN: [id number] DOB: [birth year] Today's Date: 05/25/2015    History of Present Illness patient is a 50 yo male with nonischemic cardiomyopathy since 2002 with acute on chronic systolic heart failure and cardiogenic shock, deteriorating on 2 inotropes and had IABP placed. Moderate RV systolic dysfunction on preop echo.  s/p HeartMate II LVAD    PT Comments    Pt states that he had a restless night last night due to medication and was feeling "out of it" but still participated with therapy. Pt was able to prepare for ambulation with the help of his wife. Pt was able to maintain walking distance of 300 feet with one sitting rest break halfway due to SOB (RR 36). Pt continues to make progress towards acute PT goals and would benefit from continued skilled PT to work towards independence with functional mobility to be able to return home.    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   Transfers Overall transfer level: Needs assistance Equipment used: None Transfers: Sit to/from Stand Sit to Stand: Min assist         General transfer comment: Able to stand without use of UE's. Min A for pt to gain stability once standing.   Ambulation/Gait Ambulation/Gait assistance: Min guard Ambulation Distance (Feet): 300 Feet  ([id number] with sitting rest break) Assistive device:  (Pushed a wheelchair) Gait Pattern/deviations: Step-through pattern;Decreased stride length;Trunk flexed Gait velocity: decreased Gait velocity interpretation: Below normal speed for age/gender General Gait Details: Pt became SOB with ambulation and needed sitting rest break after 150 feet. RR got up to 36.    Stairs            Wheelchair Mobility    Modified Rankin (Stroke Patients Only)       Balance Overall balance assessment: Needs assistance Sitting-balance support: No upper extremity supported;Feet unsupported Sitting balance-Leahy Scale: Good     Standing balance support: During functional activity Standing balance-Leahy Scale: Fair               High level balance activites: Head turns;Turns;Backward walking High Level Balance Comments: MinGuard with slight verbal cues for direction changes when backing up to chair.     Cognition Arousal/Alertness: Awake/alert Behavior During Therapy: WFL for tasks assessed/performed Overall Cognitive Status: Within Functional Limits for tasks assessed                      Exercises      General Comments General comments (skin integrity, edema, etc.): Pt prepared for ambulation with the help of his wife. His wife assisted him with don/doffing the vest and pt was albe to connect/disconnect from A/C power to battery power. Pt needed a few VC's from wife for sequencing.       Pertinent Vitals/Pain Pain Assessment: No/denies pain  LVAD parameters WFL, vitals stable throughout activity on 2L/ O2.  Home Living                      Prior Function            PT Goals (current goals can now be found in the care plan section) Acute Rehab PT Goals Patient Stated Goal: To go home PT Goal Formulation: With patient Time For Goal Achievement: 05/26/15 Potential to Achieve Goals: Good Progress towards PT goals: Progressing toward goals     Frequency  Min 4X/week    PT Plan Current plan remains appropriate    Co-evaluation             End of Session Equipment Utilized During Treatment: Oxygen Activity Tolerance: Patient tolerated treatment well Patient left: in chair;with call bell/phone within reach;with family/visitor present     Time: [id number] PT Time Calculation (min) (ACUTE ONLY): 29 min  Charges:  $Gait Training: 23-37 mins                    G Codes:      Colon Branch, SPT Colon Branch 05/25/2015, 1:50 PM

## 2015-05-25 NOTE — Progress Notes (Addendum)
Admitted 04/29/15 due to acute on chronic systolic CHF.   HeartMate II LVAD implated on 05/10/15 by Dr. Darcey Nora for DT with hope for bridge to candidacy.   Vital signs: Temp: afebrile HR: 99 - 105 SR Doppler Pressure: not being done Automatic BP: 100 - 120 / 95 - 98  MAP: > 100 since 0400 this AM O2 Sat: ~95% RR: 30s  Wt in lbs:  160 > ... > 176 > 175 > 173    LVAD interrogation reveals:  Speed: 9200 Flow: 4.6 Power: 5.2w PI: 7.5 - 8.0 Alarms: none Events: no PI events  Fixed speed:  9200 Low speed limit: 8600  Drive Line: Dressing C/D/I. Continue daily care. Leave stitch for malnutrition until D/W Dr. Prescott Gum about timing.   Telemetry:  HR 100 - 110s ST   Labs:  LDH trend:  247 > 282 > 276  > ... > 253 > 249 > 290  INR trend:  1.57 > 1.53 > 1.33 > ... > 3.18 > 3.73 > 2.98  Hgb:  11.2 > ... > 9.0 > 9.4 > 9.8  WBC 16k today   Blood products: Intra op:  6 units FFP Post Op:   05/10/15 ---> 2 units PC, 1 unit plts  Gtts: Milrinone 0.375 ---> 0.25 on 05/12/15  Amiodarone --->  started 05/13/15 for A flutter Levophed ---> off 05/11/15 Epi ---> off 05/12/15   NO:  Weaned off 05/12/15   PICC line: placed 05/12/15  Arrhythmia Management: 05/13/15--> IV Amiodarone bolus 05/17/15--> IV Amiodarone bolus   Cardioversion on 05/17/15 for AFib/Flutter   Plan/Recommendations:  1. D/C Teaching scheduled Thursday 05/26/15 at 10:00.   2. Home equipment will be delivered once transferred to SDU (orders in for transfer to [facility]).   3. MAPs > 100 since 0400 this AM--IV Hydralazine PRN is ordered for MAPs > 100 and is important during early post op period to avoid early thrombus formation and decrease likelihood of AVM formation.   Janene Madeira, RN VAD Coordinator   Office: [phone] 24/7 VAD Pager: [phone]   DW Dr. Lucianne Lei Trigt--patient with rales on assessment, tachypnea with RR in 30s, confused (hypoxia vs. delirium?), MAP 108 and PI 7.5 - 8.0. CXR not much different than previous  day. Change PO lasix to IV x 1 dose today. Will get spiro/Revatio soon today as well.

## 2015-05-25 NOTE — Progress Notes (Signed)
Patient ID: Patrick Humphrey, male   DOB: May 17, 1965, 50 y.o.   MRN: [id number] HeartMate 2 Rounding Note  Subjective:    LVAD placement 05/10/15.   Extubated 2/1.  Got 2 units PRBCs 1/31. Today's Hgb 9.4.    05/18/15 S/P TEE and successful DC-CV. Left  ventricle was mildly dilated with EF 15% and septal akinesis. The LVAD inflow cannula was visualized at the apex. The RV with mildly dilated with severe systolic dysfunction. Pacemaker in RV. There was trivial TR. The aortic valve did not open. There was mild aortic insufficiency. Trivial TR. Mildly dilated LA with no LA appendage thrombus. Normal RA. Moderate pericardial effusion along the LV lateral wall, no tamponade. LVAD speed increased to 9200.   He is jaundiced, total bilirubin increased.  Pre-op 4.2 => post-op 9.8 => 8.6 => 7.4 => 6.2 => 6.3 => 5.2-->5.7-->4.3--> 4.4 -->5.5-->5.3-->4.3 -->3.8. (elevated direct bilirubin as well).   CVP 12. Remains in NSR today.  He had some serosanguineous drainage from his driveline site.  WBCs 18.8 -> 16.1 -> 13.5.  Blood - NGTD. Urine culture negative. He is on broad spectrum abx. Remains afebrile.    Feels out of it today.  Feels like when he was in [place].  Says he dozes off and feels like he falls asleep in the middle of conversations.  Also feels on the verge of having panic attack at times. Occasional feeling SOB with tachypnea.   LVAD INTERROGATION:  HeartMate II LVAD:  Flow 4.7 liters/min, speed 9200,  power 5.1, PI 7.7.  No PI events  Objective:    Vital Signs:   Temp:  [97.7 F (36.5 C)-98.1 F (36.7 C)] 97.7 F (36.5 C) (02/15 0735) Pulse Rate:  [93-128] 105 (02/15 0900) Resp:  [19-48] 48 (02/15 0900) BP: (92-123)/(72-101) 118/101 mmHg (02/15 0900) SpO2:  [88 %-100 %] 96 % (02/15 0900) Weight:  [173 lb 11.6 oz (78.8 kg)] 173 lb 11.6 oz (78.8 kg) (02/15 0500) Last BM Date: 05/24/15 Mean arterial Pressure 100s  Intake/Output:   Intake/Output Summary (Last 24 hours) at  05/25/15 1013 Last data filed at 05/25/15 0900  Gross per 24 hour  Intake    856 ml  Output    825 ml  Net     31 ml     Physical Exam: CVP 12 General:  NAD. No resp difficulty. In bed   HEENT: normal Neck: supple. JVP 9-10 . Carotids 2+ bilat; no bruits. No thryomegaly or nodule noted. .  Cor: Mechanical heart sounds with LVAD hum present. Lungs: Mild basilar crackles.  Abdomen: soft, NT, ND, no HSM. No bruits or masses. +BS  Driveline: C/D/I; securement device intact and driveline incorporated Extremities: no cyanosis, clubbing, rash, R and LLE ted hose. R and L ankle trace edema. LUE PICC.  Neuro: alert & orientedx3, cranial nerves grossly intact. moves all 4 extremities w/o difficulty. Affect pleasant  Telemetry: SR 90-100s     Labs: Basic Metabolic Panel:  Recent Labs Lab 05/21/15 0420 05/22/15 0503 05/23/15 0403 05/24/15 0600 05/25/15 0440  NA 126* 126* 125* 128* 128*  K 4.0 3.4* 3.4* 3.4* 3.8  CL 91* 91* 92* 95* 96*  CO2 26 23 23 23 24   GLUCOSE 92 83 154* 100* 96  BUN 8 9 8 7 6   CREATININE 0.55* 0.63 0.60* 0.60* 0.54*  CALCIUM 8.2* 8.1* 7.8* 8.3* 8.1*    Liver Function Tests:  Recent Labs Lab 05/21/15 0420 05/22/15 0503 05/23/15 0403 05/24/15 0600 05/25/15 0440  AST  64* 59* 65* 63* 60*  ALT 29 33 35 33 32  ALKPHOS 116 131* 132* 120 122  BILITOT 4.4* 5.5* 5.3* 4.3* 3.8*  PROT 5.4* 5.2* 5.3* 5.9* 5.7*  ALBUMIN 2.6* 2.4* 2.2* 2.4* 2.4*   No results for input(s): LIPASE, AMYLASE in the last 168 hours. No results for input(s): AMMONIA in the last 168 hours.  CBC:  Recent Labs Lab 05/21/15 0420 05/22/15 0503 05/23/15 0403 05/24/15 0600 05/25/15 0440  WBC 19.0* 20.7* 18.8* 16.1* 13.5*  HGB 9.5* 9.4* 9.2* 9.9* 9.7*  HCT 28.6* 28.3* 27.7* 28.7* 27.9*  MCV 94.4 95.3 95.5 97.3 97.6  PLT 416* 408* 418* 380 349    INR:  Recent Labs Lab 05/21/15 0420 05/22/15 0503 05/23/15 0403 05/24/15 0600 05/25/15 0440  INR 2.19* 2.08* 2.11* 2.29*  2.65*    Other results:  EKG:   Imaging: Dg Chest Port 1 View  05/25/2015  CLINICAL DATA:  Left ventricular assist device placement EXAM: PORTABLE CHEST 1 VIEW COMPARISON:  May 24, 2015 FINDINGS: Central catheter tip is at the cavoatrial junction. The small pneumothorax noted on the left 1 day prior is not appreciable currently. Left ventricular assist device is present. Pacemaker lead attached to the right ventricle, stable. There is cardiomegaly with pulmonary venous hypertension. There are bilateral pleural effusions with mild interstitial edema. There is patchy atelectasis in both mid lung regions as well as consolidation in the left base. IMPRESSION: The small pneumothorax noted 1 day prior on the left is not appreciable on this current examination. Evidence of a degree of congestive heart failure. Cannot exclude superimposed pneumonia left lower lobe. No change in cardiac silhouette. Electronically Signed   By: Lowella Grip III M.D.   On: 05/25/2015 07:45   Dg Chest Port 1 View  05/24/2015  CLINICAL DATA:  Left ventricular assist device EXAM: PORTABLE CHEST 1 VIEW COMPARISON:  Portable chest x-ray of May 23, 2015 and fluoro spot images of the same day. FINDINGS: There is a new approximately 10% left apical pneumothorax. There is stable increase retrocardiac density on the left as well as subsegmental atelectasis more peripherally. A trace of pleural fluid on the left is present. There is subsegmental atelectasis at the right lung base and trace right pleural effusion. A left ventricular assist device is present and appears to be in stable position. The cardiac silhouette remains enlarged. The pulmonary vascularity is less engorged. The implantable pacemaker defibrillator is in stable position. The left-sided PICC line tip projects over the midportion of the SVC. IMPRESSION: 1. New 10% left apical pneumothorax. 2. Persistent cardiomegaly with mild central pulmonary vascular prominence  but improved peripheral interstitial edema. The left ventricular assist device is in reasonable position. 3. Dense left basilar atelectasis or infiltrate. Mild subsegmental atelectasis at the right lung base. 4. No large pleural effusion. 5. Critical Value/emergent results were called by telephone at the time of interpretation on 05/24/2015 at 7:45 am to Benjaman Pott, RN, who verbally acknowledged these results. Electronically Signed   By: David  Martinique M.D.   On: 05/24/2015 07:47     Medications:     Scheduled Medications: . amiodarone  400 mg Oral BID  . antiseptic oral rinse  7 mL Mouth Rinse BID  . aspirin EC  81 mg Oral Daily  . benzonatate  100 mg Oral BID  . bisacodyl  10 mg Oral Daily   Or  . bisacodyl  10 mg Rectal Daily  . citalopram  20 mg Oral Daily  .  digoxin  0.125 mg Oral Daily  . docusate sodium  200 mg Oral Daily  . feeding supplement  1 Container Oral TID BM  . fluticasone  1 spray Each Nare Daily  . guaiFENesin  600 mg Oral BID  . guaiFENesin-dextromethorphan  5 mL Oral QHS  . imipenem-cilastatin  500 mg Intravenous Q6H  . loratadine  10 mg Oral Daily  . montelukast  10 mg Oral QHS  . pantoprazole  40 mg Oral Daily  . sildenafil  40 mg Oral TID  . sodium chloride flush  10 mL Intravenous Q12H  . sodium chloride flush  3 mL Intravenous Q12H  . spironolactone  12.5 mg Oral Daily  . vancomycin  1,000 mg Intravenous Q12H  . warfarin  1 mg Oral ONCE-1800  . Warfarin - Pharmacist Dosing Inpatient   Does not apply q1800    Infusions: . sodium chloride 10 mL/hr at 05/25/15 0800  . sodium chloride    . milrinone 0.125 mcg/kg/min (05/25/15 0800)    PRN Medications: hydrALAZINE, ondansetron (ZOFRAN) IV, sodium chloride flush, sodium chloride flush, sodium chloride flush, zolpidem   Assessment:   1. Nonischemic cardiomyopathy: Long-standing, EF 20-25%.  Coronary angiography this admission with no significant CAD. Cardiogenic shock requiring milrinone and  norepinephrine, IABP placed. Heartmate II LVAD placed 05/10/15.  2. Post-op blood loss anemia: 2 units PRBCs 1/31.  3. Elevate bilirubin: Noted pre-op, thought due to RV failure.  Higher post-op with jaundice.  Elevated direct bilirubin, so liver source rather than hemolysis.  4. Atrial flutter: Resolved on amiodarone.  5. Hyponatremia 6. Atrial fibrillation with RVR 2/7.  TEE-guided DCCV 05/18/15.  7. RV failure: Revatio begun 2/7.  8. Small left apical PTX.  9. Elevated WBCs: Empiric vancomycin/ceftazidime and fluconazole added.  Bibasilar opacity on CXR.   Plan/Discussion:    ECHO with severe RV dysfunction and mod pericardial effusion. Continue sildenafil 40 mg tid, digoxin 0.125, and milrinone 0.125 mcg/kg/min.  Co-ox marginal-->54.7%. Will not try to wean off milrinone at this time. He will need home milrinone. PICC in place.   S/P TEE successful DC-CV 2/8. Based on TEE, speed increased to 9200.  HR 90-100s NSR, remains on po amiodarone.     MAP elevated. CVP 12 this am per nurse.  Got lasix 40 mg po this am. Will give 40 mg IV lasix this pm.  With RV failure will need to run CVP higher, aim around 10. Continue 12.5 mg spiro daily.   INR today 2.65, hemoglobin stable. Continue ASA 81 mg daily.  LDH trending up slightly 258>281>297>300  WBCs trending down 13.5.  CXR persistent bibasilar opacities.  On broad spectrum antibiotics vanco/primaxin/ fluconazole.  Blood cultures NGTD.  Urine culture negative.    Total bilirubin 3.8. Elevation pre-op thought due to CHF/RV failure, more marked post-op. Normal alkaline phosphatase. Elevated direct bilirubin suggests liver source.   Ramp echo needed eventually.   I reviewed the LVAD parameters from today, and compared the results to the patient's prior recorded data.  No programming changes were made.  The LVAD is functioning within specified parameters.  The patient performs LVAD self-test daily.  LVAD interrogation was negative for any significant  power changes, alarms or PI events/speed drops.  LVAD equipment check completed and is in good working order.  Back-up equipment present.   LVAD education done on emergency procedures and precautions and reviewed exit site care.  Length of Stay: [address]  Shirley Friar   PA-C  05/25/2015, 10:13 AM  VAD Team --- VAD ISSUES ONLY--- Pager [phone] (7am - 7am)  Advanced Heart Failure Team  Pager [phone] (M-F; 7a - 4p)  Please contact [facility] for night-coverage after hours (4p -7a ) and weekends on [email or web address]  Patient seen with PA, agree with the above note.   Main problem has been RV failure.  We increased Revation to 40 mg tid and he remains on milrinone 0.125.  Co-ox about 55%.  On ramp echo today, RV function looked better, was severely dysfunctional on last echo and would rate as moderately dysfunctional today.  Based on ramp echo, we felt we could safely increase speed to 9400 without significant effect on RV.   Tomorrow, if all is stable, will try him off milrinone.   I think that his drowsiness and "feeling out of it" this morning is due to getting oxycodone last night.  He does seem to have an exaggerated response to narcotics, need to try to avoid.    With CVP 12, can give dose of Lasix 40 mg IV this afternoon, likely back to 40 mg po daily tomorrow.   Send out to step down tomorrow if stable.   [name] 05/25/2015 4:27 PM

## 2015-05-26 LAB — CBC
HCT: 29.2 % — ABNORMAL LOW (ref 39.0–52.0)
Hemoglobin: 9.7 g/dL — ABNORMAL LOW (ref 13.0–17.0)
MCH: 32 pg (ref 26.0–34.0)
MCHC: 33.2 g/dL (ref 30.0–36.0)
MCV: 96.4 fL (ref 78.0–100.0)
Platelets: 379 10*3/uL (ref 150–400)
RBC: 3.03 MIL/uL — ABNORMAL LOW (ref 4.22–5.81)
RDW: 17.3 % — ABNORMAL HIGH (ref 11.5–15.5)
WBC: 12.5 10*3/uL — ABNORMAL HIGH (ref 4.0–10.5)

## 2015-05-26 LAB — COMPREHENSIVE METABOLIC PANEL
ALT: 32 U/L (ref 17–63)
AST: 62 U/L — ABNORMAL HIGH (ref 15–41)
Albumin: 2.4 g/dL — ABNORMAL LOW (ref 3.5–5.0)
Alkaline Phosphatase: 116 U/L (ref 38–126)
Anion gap: 10 (ref 5–15)
BUN: 6 mg/dL (ref 6–20)
CO2: 24 mmol/L (ref 22–32)
Calcium: 8.2 mg/dL — ABNORMAL LOW (ref 8.9–10.3)
Chloride: 95 mmol/L — ABNORMAL LOW (ref 101–111)
Creatinine, Ser: 0.62 mg/dL (ref 0.61–1.24)
GFR calc Af Amer: >60 mL/min (ref >60)
GFR calc non Af Amer: >60 mL/min (ref >60)
Glucose, Bld: 89 mg/dL (ref 65–99)
Potassium: 3.7 mmol/L (ref 3.5–5.1)
Sodium: 129 mmol/L — ABNORMAL LOW (ref 135–145)
Total Bilirubin: 3.8 mg/dL — ABNORMAL HIGH (ref 0.3–1.2)
Total Protein: 5.7 g/dL — ABNORMAL LOW (ref 6.5–8.1)

## 2015-05-26 LAB — CARBOXYHEMOGLOBIN
Carboxyhemoglobin: 2.6 % — ABNORMAL HIGH (ref 0.5–1.5)
Methemoglobin: 0.6 % (ref 0.0–1.5)
O2 Saturation: 57.2 %
Total hemoglobin: 13.3 g/dL — ABNORMAL LOW (ref 13.5–18.0)

## 2015-05-26 LAB — CULTURE, BLOOD (ROUTINE X 2)
Culture: NO GROWTH
Culture: NO GROWTH

## 2015-05-26 LAB — LACTATE DEHYDROGENASE: LDH: 295 U/L — ABNORMAL HIGH (ref 98–192)

## 2015-05-26 LAB — PROTIME-INR
INR: 2.56 — ABNORMAL HIGH (ref 0.00–1.49)
Prothrombin Time: 27.2 seconds — ABNORMAL HIGH (ref 11.6–15.2)

## 2015-05-26 MED ORDER — AMIODARONE HCL 200 MG PO TABS
200.0000 mg | ORAL_TABLET | Freq: Two times a day (BID) | ORAL | Status: DC
  Administered 2015-05-26 – 2015-05-30 (×8): 200 mg via ORAL
  Filled 2015-05-26 (×8): qty 1

## 2015-05-26 MED ORDER — WARFARIN SODIUM 1 MG PO TABS
1.0000 mg | ORAL_TABLET | Freq: Once | ORAL | Status: AC
  Administered 2015-05-26: 1 mg via ORAL
  Filled 2015-05-26: qty 1

## 2015-05-26 MED ORDER — ALPRAZOLAM 0.25 MG PO TABS
0.2500 mg | ORAL_TABLET | Freq: Every day | ORAL | Status: DC
  Administered 2015-05-26 – 2015-05-29 (×4): 0.25 mg via ORAL
  Filled 2015-05-26 (×4): qty 1

## 2015-05-26 MED ORDER — SPIRONOLACTONE 25 MG PO TABS
25.0000 mg | ORAL_TABLET | Freq: Every day | ORAL | Status: DC
  Administered 2015-05-26 – 2015-05-30 (×5): 25 mg via ORAL
  Filled 2015-05-26 (×5): qty 1

## 2015-05-26 MED ORDER — FUROSEMIDE 40 MG PO TABS
40.0000 mg | ORAL_TABLET | Freq: Every day | ORAL | Status: DC
  Administered 2015-05-26 – 2015-05-27 (×2): 40 mg via ORAL
  Filled 2015-05-26 (×2): qty 1

## 2015-05-26 NOTE — Progress Notes (Signed)
Patient ID: Patrick Humphrey, male   DOB: [birth year], 50 y.o.   MRN: [id number] HeartMate 2 Rounding Note  Subjective:    LVAD placement 05/10/15.   Extubated 2/1.  Got 2 units PRBCs 1/31. Today's Hgb 9.7.    05/18/15 S/P TEE and successful DC-CV. Left  ventricle was mildly dilated with EF 15% and septal akinesis. The LVAD inflow cannula was visualized at the apex. The RV with mildly dilated with severe systolic dysfunction. Pacemaker in RV. There was trivial TR. The aortic valve did not open. There was mild aortic insufficiency. Trivial TR. Mildly dilated LA with no LA appendage thrombus. Normal RA. Moderate pericardial effusion along the LV lateral wall, no tamponade. LVAD speed increased to 9200.   05/25/15 ECHO with improved RV funciton. LVAD speed increased to 9400.    Pre-op 4.2 => post-op 9.8 => 8.6 => 7.4 => 6.2 => 6.3 => 5.2-->5.7-->4.3--> 4.4 -->5.5-->5.3-->4.3 -->3.-->3.8  (elevated direct bilirubin as well).   Yesterday diuresed with 40 mg IV lasix. CVP 8. Remains in NSR today.  No drainage around driveline. WBCs down to 12.5.  Blood - NGTD. Urine culture negative. He is on broad spectrum abx. Remains afebrile.  Ambulated 300 feet.   Denies SOB.    LVAD INTERROGATION:  HeartMate II LVAD:  Flow 5.2 liters/min, speed 9400,  power 6, PI 7.2. 4 PI events  Objective:    Vital Signs:   Temp:  [98.6 F (37 C)-99 F (37.2 C)] 98.6 F (37 C) (02/16 0400) Pulse Rate:  [89-106] 102 (02/16 0700) Resp:  [20-48] 22 (02/16 0700) BP: (88-120)/(65-101) 115/81 mmHg (02/16 0700) SpO2:  [96 %-99 %] 97 % (02/16 0400) Weight:  [171 lb 4.8 oz (77.7 kg)] 171 lb 4.8 oz (77.7 kg) (02/16 0500) Last BM Date: 05/25/15 Mean arterial Pressure 70-90s   Intake/Output:   Intake/Output Summary (Last 24 hours) at 05/26/15 0828 Last data filed at 05/26/15 0700  Gross per 24 hour  Intake   1183 ml  Output   3250 ml  Net  -2067 ml     Physical Exam: CVP 8 General:  NAD. No resp  difficulty. In bed   HEENT: normal Neck: supple. JVP 7-8 . Carotids 2+ bilat; no bruits. No thryomegaly or nodule noted. .  Cor: Mechanical heart sounds with LVAD hum present. Lungs: Mild basilar crackles.  Abdomen: soft, NT, ND, no HSM. No bruits or masses. +BS  Driveline: C/D/I; securement device intact and driveline incorporated Extremities: no cyanosis, clubbing, rash, R and LLE ted hose. R and L ankle trace edema. LUE PICC.  Neuro: alert & orientedx3, cranial nerves grossly intact. moves all 4 extremities w/o difficulty. Affect pleasant  Telemetry: SR 90-100s     Labs: Basic Metabolic Panel:  Recent Labs Lab 05/22/15 0503 05/23/15 0403 05/24/15 0600 05/25/15 0440 05/26/15 0535  NA 126* 125* 128* 128* 129*  K 3.4* 3.4* 3.4* 3.8 3.7  CL 91* 92* 95* 96* 95*  CO2 23 23 23 24 24   GLUCOSE 83 154* 100* 96 89  BUN 9 8 7 6 6   CREATININE 0.63 0.60* 0.60* 0.54* 0.62  CALCIUM 8.1* 7.8* 8.3* 8.1* 8.2*    Liver Function Tests:  Recent Labs Lab 05/22/15 0503 05/23/15 0403 05/24/15 0600 05/25/15 0440 05/26/15 0535  AST 59* 65* 63* 60* 62*  ALT 33 35 33 32 32  ALKPHOS 131* 132* 120 122 116  BILITOT 5.5* 5.3* 4.3* 3.8* 3.8*  PROT 5.2* 5.3* 5.9* 5.7* 5.7*  ALBUMIN 2.4* 2.2* 2.4*  2.4* 2.4*   No results for input(s): LIPASE, AMYLASE in the last 168 hours. No results for input(s): AMMONIA in the last 168 hours.  CBC:  Recent Labs Lab 05/22/15 0503 05/23/15 0403 05/24/15 0600 05/25/15 0440 05/26/15 0535  WBC 20.7* 18.8* 16.1* 13.5* 12.5*  HGB 9.4* 9.2* 9.9* 9.7* 9.7*  HCT 28.3* 27.7* 28.7* 27.9* 29.2*  MCV 95.3 95.5 97.3 97.6 96.4  PLT 408* 418* 380 349 379    INR:  Recent Labs Lab 05/22/15 0503 05/23/15 0403 05/24/15 0600 05/25/15 0440 05/26/15 0535  INR 2.08* 2.11* 2.29* 2.65* 2.56*    Other results:  EKG:   Imaging: Dg Chest Port 1 View  05/25/2015  CLINICAL DATA:  Left ventricular assist device placement EXAM: PORTABLE CHEST 1 VIEW COMPARISON:   May 24, 2015 FINDINGS: Central catheter tip is at the cavoatrial junction. The small pneumothorax noted on the left 1 day prior is not appreciable currently. Left ventricular assist device is present. Pacemaker lead attached to the right ventricle, stable. There is cardiomegaly with pulmonary venous hypertension. There are bilateral pleural effusions with mild interstitial edema. There is patchy atelectasis in both mid lung regions as well as consolidation in the left base. IMPRESSION: The small pneumothorax noted 1 day prior on the left is not appreciable on this current examination. Evidence of a degree of congestive heart failure. Cannot exclude superimposed pneumonia left lower lobe. No change in cardiac silhouette. Electronically Signed   By: Lowella Grip III M.D.   On: 05/25/2015 07:45     Medications:     Scheduled Medications: . amiodarone  400 mg Oral BID  . antiseptic oral rinse  7 mL Mouth Rinse BID  . aspirin EC  81 mg Oral Daily  . benzonatate  100 mg Oral BID  . bisacodyl  10 mg Oral Daily   Or  . bisacodyl  10 mg Rectal Daily  . citalopram  20 mg Oral Daily  . digoxin  0.125 mg Oral Daily  . docusate sodium  200 mg Oral Daily  . feeding supplement  1 Container Oral TID BM  . fluticasone  1 spray Each Nare Daily  . furosemide  40 mg Intravenous Daily  . guaiFENesin  600 mg Oral BID  . guaiFENesin-dextromethorphan  5 mL Oral QHS  . imipenem-cilastatin  500 mg Intravenous Q6H  . loratadine  10 mg Oral Daily  . montelukast  10 mg Oral QHS  . pantoprazole  40 mg Oral Daily  . sildenafil  40 mg Oral TID  . sodium chloride flush  10 mL Intravenous Q12H  . sodium chloride flush  3 mL Intravenous Q12H  . spironolactone  12.5 mg Oral Daily  . vancomycin  1,000 mg Intravenous Q12H  . Warfarin - Pharmacist Dosing Inpatient   Does not apply q1800    Infusions: . sodium chloride 10 mL/hr at 05/26/15 0400  . sodium chloride    . milrinone 0.125 mcg/kg/min (05/26/15  0400)    PRN Medications: hydrALAZINE, HYDROcodone-acetaminophen, ondansetron (ZOFRAN) IV, sodium chloride flush, sodium chloride flush, sodium chloride flush, zolpidem   Assessment:   1. Nonischemic cardiomyopathy: Long-standing, EF 20-25%.  Coronary angiography this admission with no significant CAD. Cardiogenic shock requiring milrinone and norepinephrine, IABP placed. Heartmate II LVAD placed 05/10/15.  2. Post-op blood loss anemia: 2 units PRBCs 1/31.  3. Elevate bilirubin: Noted pre-op, thought due to RV failure.  Higher post-op with jaundice.  Elevated direct bilirubin, so liver source rather than hemolysis.  4.  Atrial flutter: Resolved on amiodarone.  5. Hyponatremia 6. Atrial fibrillation with RVR 2/7.  TEE-guided DCCV 05/18/15.  7. RV failure: Revatio begun 2/7.  8. Small left apical PTX.  9. Elevated WBCs: Empiric vancomycin/ceftazidime and fluconazole added.  Bibasilar opacity on CXR.   Plan/Discussion:    Ramp echo on 2/15 => RV looked better, only moderately dysfunctional.  Speed increased to 9400 rpm without any compromise to RV. Continues on sildenafil 40 mg tid, digoxin 0.125, and milrinone 0.125 mcg/kg/min.  Co-ox 57%. Stop milrinone today. PICC in place. Dig level 0.3.   S/P TEE successful DC-CV 2/8. He remains on amio 400 mg twice a day. Can decrease amiodarone to 200 mg bid.   MAP elevated. CVP 8. Personally checked. Stop IV lasix. Start lasix 40 mg daily. RV better on ECHO. Increase spiro 25 mg daily.    INR today 2.56, hemoglobin stable. Continue ASA 81 mg daily.  LDH 295.  WBCs trending down 12.5.  CXR persistent bibasilar opacities.  On broad spectrum antibiotics vanco/imipenem.  Blood cultures NGTD.  Urine culture negative.    Total bilirubin 3.8- unchanged. Elevation pre-op thought due to CHF/RV failure, more marked post-op. Normal alkaline phosphatase. Elevated direct bilirubin suggests liver source.   I reviewed the LVAD parameters from today, and compared  the results to the patient's prior recorded data.  No programming changes were made.  The LVAD is functioning within specified parameters.  The patient performs LVAD self-test daily.  LVAD interrogation was negative for any significant power changes, alarms or PI events/speed drops.  LVAD equipment check completed and is in good working order.  Back-up equipment present.   LVAD education done on emergency procedures and precautions and reviewed exit site care.  Waiting on bed in stepdown.   Length of Stay: [place]   NP-C   05/26/2015, 8:28 AM  VAD Team --- VAD ISSUES ONLY--- Pager [phone] (7am - 7am)  Advanced Heart Failure Team  Pager [phone] (M-F; 7a - 4p)  Please contact [facility] for night-coverage after hours (4p -7a ) and weekends on [email or web address]  Patient seen with NP, agree with the above note.    RV looked better yesterday on ramp echo and able to increase speed to 9400 without compromising RV.  Had only a few PIs events in the last day.  Diuresed yesterday with IV Lasix, CVP 8 today.  Will give Lasix 40 mg po daily.  Continue Revatio 40 mg tid.  With improved-appearing RV, will try stopping milrinone today.  Follow co-ox and symptoms.    MAP mildly elevated, can increase spironolactone to 25 mg daily.   Continue walks.  Getting nearer to home.   [name] 05/26/2015 8:55 AM

## 2015-05-26 NOTE — Progress Notes (Signed)
CARDIAC REHAB PHASE I   PRE:  Rate/Rhythm: 105 ST  BP:  Sitting: 100 MAP        SaO2: 94 RA  MODE:  Ambulation: 200 ft   POST:  Rate/Rhythm: 119 ST  BP:  Sitting: 99 MAP         SaO2: 95 RA  Pt in bed eating pizza, agreeable to walk, flat affect. Pt wife present, assisting with most of pt's care. Pt's wife switched pt over to battery power source, pt states he has been doing it by himself too. Pt stood with minimal assistance, good use of sternal precautions. Pt ambulated 200 ft on RA, IV, assist x2, pushing wheelchair, slow, fairly steady gait, tolerated fair. Pt c/o DOE, requested to return to room due to urgent need to have a bowel movement, pt apologetic. Pt assisted to bedside commode then to bed. Upon return to bed, pt wife returned pt to wall power source. Pt to bed per pt request after walk, call bell within reach. Will follow.   [place], RN, BSN 05/26/2015 2:05 PM

## 2015-05-26 NOTE — Progress Notes (Signed)
CSW met with patient at bedside. Patient shared frustrations with recent "dreams" which he attributed to medications. Patient also spoke about lack of appetite which "seems to come and go". Patient reports RN and MD aware. CSW provided listening and support and will continue to follow throughout hospitalization. Jackie Brennan, LCSW [phone]  

## 2015-05-26 NOTE — Progress Notes (Signed)
ANTICOAGULATION CONSULT NOTE - Follow Up Consult  Pharmacy Consult for Coumadin Indication: s/p LVAD  Patient Measurements: Height: 5\' 11"  (180.3 cm) Weight: 171 lb 4.8 oz (77.7 kg) IBW/kg (Calculated) : 75.3  Vital Signs: Temp: 98.1 F (36.7 C) (02/16 0700) Temp Source: Oral (02/16 0700) BP: 115/81 mmHg (02/16 0700) Pulse Rate: 102 (02/16 0700)  Labs:  Recent Labs  05/24/15 0600 05/25/15 0440 05/26/15 0535  HGB 9.9* 9.7* 9.7*  HCT 28.7* 27.9* 29.2*  PLT 380 349 379  LABPROT 25.0* 27.9* 27.2*  INR 2.29* 2.65* 2.56*  CREATININE 0.60* 0.54* 0.62    Estimated Creatinine Clearance: 117.7 mL/min (by C-G formula based on Cr of 0.62).   Assessment: 50 year old male with long-standing history of chronic systolic heart failure, nonischemic cardiomyopathy, ICD, Dr. Gwenlyn Found, who has been experiencing increasing dyspnea consistent with acute on chronic systolic heart failure.  LVAD 1/31  AC: heparin off post vad. INR 2.5 today, Cbc stable. Continue to monitor for drug interaction with Amio and new fluconazole (stopping today)  ID: no fevers, wbc stable 12 (down),CXR persistent bibasilar opacities.Cover HCAP with vanco/ceftazidime (per MD). Vancomycin trough still within desired goal, will continue without adjustment. No drainage around drive line.  2/10 VT -17 - no changes 2/15 VT - 14 - no changes  Vanc 2/7>> Fortaz 2/9>>2/12 Primaxin 2/12>> Fluconazole 2/11>2/15  1/29 mrsa pcr - neg 2/11 BC x 2>>ngF 2/11 Urine - ngF 2/14 wound (driveline) - ngtd  Goal of Therapy:  INR 2-2.5  Vancomycin level 10-20 Monitor platelets by anticoagulation protocol: Yes   Plan:  -Warfarin - 1mg  tonight -Vancomycin 1g q12 for now -Continue Primaxin 500mg  IV q6 hours -Continue IV abx until driveline site improves per surgery  Erin Hearing PharmD., BCPS Clinical Pharmacist Pager [phone] 05/26/2015 9:33 AM

## 2015-05-26 NOTE — Progress Notes (Signed)
PT Cancellation Note  Patient Details Name: Patrick Humphrey MRN: [id number] DOB: 05/20/1965   Cancelled Treatment:    Reason Eval/Treat Not Completed: Patient at procedure or test/unavailable (Cardiac Rehab RN in room working with pt. )  Colon Branch, SPT Colon Branch 05/26/2015, 2:24 PM

## 2015-05-27 ENCOUNTER — Inpatient Hospital Stay (HOSPITAL_COMMUNITY): Payer: Medicare Other

## 2015-05-27 LAB — CBC
HCT: 30.7 % — ABNORMAL LOW (ref 39.0–52.0)
Hemoglobin: 10.3 g/dL — ABNORMAL LOW (ref 13.0–17.0)
MCH: 32.3 pg (ref 26.0–34.0)
MCHC: 33.6 g/dL (ref 30.0–36.0)
MCV: 96.2 fL (ref 78.0–100.0)
Platelets: 376 10*3/uL (ref 150–400)
RBC: 3.19 MIL/uL — ABNORMAL LOW (ref 4.22–5.81)
RDW: 16.9 % — ABNORMAL HIGH (ref 11.5–15.5)
WBC: 12.2 10*3/uL — ABNORMAL HIGH (ref 4.0–10.5)

## 2015-05-27 LAB — COMPREHENSIVE METABOLIC PANEL
ALT: 31 U/L (ref 17–63)
AST: 64 U/L — ABNORMAL HIGH (ref 15–41)
Albumin: 2.5 g/dL — ABNORMAL LOW (ref 3.5–5.0)
Alkaline Phosphatase: 128 U/L — ABNORMAL HIGH (ref 38–126)
Anion gap: 9 (ref 5–15)
BUN: 8 mg/dL (ref 6–20)
CO2: 24 mmol/L (ref 22–32)
Calcium: 8.1 mg/dL — ABNORMAL LOW (ref 8.9–10.3)
Chloride: 96 mmol/L — ABNORMAL LOW (ref 101–111)
Creatinine, Ser: 0.63 mg/dL (ref 0.61–1.24)
GFR calc Af Amer: >60 mL/min (ref >60)
GFR calc non Af Amer: >60 mL/min (ref >60)
Glucose, Bld: 97 mg/dL (ref 65–99)
Potassium: 3.5 mmol/L (ref 3.5–5.1)
Sodium: 129 mmol/L — ABNORMAL LOW (ref 135–145)
Total Bilirubin: 3.7 mg/dL — ABNORMAL HIGH (ref 0.3–1.2)
Total Protein: 6 g/dL — ABNORMAL LOW (ref 6.5–8.1)

## 2015-05-27 LAB — CARBOXYHEMOGLOBIN
Carboxyhemoglobin: 2.9 % — ABNORMAL HIGH (ref 0.5–1.5)
Methemoglobin: 0.7 % (ref 0.0–1.5)
O2 Saturation: 60.6 %
Total hemoglobin: 10.2 g/dL — ABNORMAL LOW (ref 13.5–18.0)

## 2015-05-27 LAB — WOUND CULTURE: Culture: NO GROWTH

## 2015-05-27 LAB — PROTIME-INR
INR: 2.94 — ABNORMAL HIGH (ref 0.00–1.49)
Prothrombin Time: 30.1 seconds — ABNORMAL HIGH (ref 11.6–15.2)

## 2015-05-27 LAB — LACTATE DEHYDROGENASE: LDH: 327 U/L — ABNORMAL HIGH (ref 98–192)

## 2015-05-27 MED ORDER — WARFARIN 0.5 MG HALF TABLET
0.5000 mg | ORAL_TABLET | Freq: Once | ORAL | Status: AC
  Administered 2015-05-27: 0.5 mg via ORAL
  Filled 2015-05-27 (×2): qty 1

## 2015-05-27 MED ORDER — LOSARTAN POTASSIUM 25 MG PO TABS
12.5000 mg | ORAL_TABLET | Freq: Every day | ORAL | Status: DC
  Administered 2015-05-27 – 2015-05-30 (×4): 12.5 mg via ORAL
  Filled 2015-05-27 (×4): qty 1

## 2015-05-27 MED ORDER — POTASSIUM CHLORIDE CRYS ER 20 MEQ PO TBCR
40.0000 meq | EXTENDED_RELEASE_TABLET | Freq: Once | ORAL | Status: AC
  Administered 2015-05-27: 40 meq via ORAL
  Filled 2015-05-27: qty 2

## 2015-05-27 NOTE — Progress Notes (Signed)
Discharge VAD teaching completed with patient and caregivers. Janace Hoard (wife) and Nicolae Dann (sister-in-law).    The patient's family has completed a home inspection checklist, this has been reviewed and no unsafe conditions have been identified.  Family has ensured two dedicated grounded, 3-prong outlets with clearly labeled circuit breaker has been established in the bedroom for power module and Charity fundraiser.  Both patient and caregivers have been trained on the following:  ____ Overview of major warnings and cautions  ____ HM II LVAD overview of system operation  ____    Overview of system components (features and functions) ____ Changing power source ____ Overview of alerts and alarms ____ How to identify an emergency ____ How to handle an emergency including when the pump is running and when the pump has stopped  ____ Changing system controller ____ Maintain emergency contact list  The caregivers have successfully demonstrated:  ____ Changing power source (from batteries to power module, power module to  batteries, and replacing batteries) ____ Perform system controller self test  ____ Perform power module self test  ____ Check and charge batteries  ____ Change system controller  A daily flow sheet with patient  weight, temperature,  flow, speed, power, and PI, along with daily self checks on system controller and power module and will be performed by patient and caregiver during hospitalization and will also be done daily at home.   The caregiver has been trained on percutaneous lead exit site care and dressing changes and has performed dressing changes during patient's hospitalization under nursing supervision. The importance of lead immobilization has been stressed to patient and caregivers using the attachment device. The caregiver has successfully demonstrated the following: ____ Cleansing ____ Dressing ____ Immobilizing lead  The following routine activities and maintenance  have been reviewed with patient and caregivers and both verbalize understanding:  ____ Stressed importance of never disconnecting power from both controller power leads at the same time, and never disconnecting both batteries at the same time, or the pump will stop ____ Plug the power module (PM) or the universal Charity fundraiser (UBC) into properly grounded (3 prong) outlets dedicated to PM use. Do NOT use adapter (cheater plug) for ungrounded outlets or multiple portable socket outlets (power strips) ____ Do not connect the PM or UBC to an outlet controlled by wall switch or the device may not work ____ The PM's internal battery (that provides limited backup power to the LVAD in the event of AC mains power failure) remains charged as long as the PM is connected to [facility] or DC power ____ The PM's internal battery provides approximately 30 minutes of emergency backup power for the LVAD ____ Transfer from PM to batteries during [facility] mains power failure. The PM has internal backup battery that will power the pump while you transfer to batteries ____ Keep a backup system controller, charged batteries, battery clips, and             flashlight near you during sleep in case of electrical power outage ____ Clean battery, battery clip, and universal battery charger contacts weekly ____ Visually inspect percutaneous lead daily ____ Check cables and connectors when changing power source  ____    Rotate batteries; keep all eight batteries charged ____ Always have backup system controller, battery clips, fully charged batteries, and spare fully charged batteries when traveling ____    Patient should have a caregiver present during system controller exchange or call 911 and VAD pager before  attempting to change controller if alone.   Identified the following changes in activities of daily living with pump:  ____ No driving for at least six weeks and then only if doctor gives    permission to do  so ____ No tub baths while pump implanted, and shower only if doctor gives    permission ____    No swimming or submersion in water while implanted with pump ____    Keep all VAD equipment away from water or moisture ____ Keep all VAD connections clean and dry ____ No contact sports or engage in jumping activities ____ Avoid strong static electricity (touching TV/computer screens, vacuuming) ____ Never have an MRI while implanted with the pump ____ Never leave or store batteries in extremely hot or cold places (such as   trunk of your car), or the battery life will be shortened ____ Call the doctor or hospital contact person if any change in how the pump            sounds, feels, or works ____ Plan to sleep only when connected to the power module. ____    Keep a backup system controller, charged batteries, battery clips, and             flashlight near you during sleep in case of electrical power outage ____ Do not sleep on your stomach ____ Talk with doctor before any long distance travel plans ____ Patient will need antibiotics prior to any dental procedure; instructed to contact VAD coordinator before any dental procedures (including routine cleaning)   Discharge binder given to patient and include the following:  ____ Discharge instructions sheet ____ List of emergency contacts ____ Vanna Scotland card ____    HM II Luggage tags ____ Guide to Percutaneous Lead Care and Equipment Maintenance ____ HM II Alarms for Patients and their Caregivers ____ HM II Patient Handbook ____ HM II Patient Education Program DVD ____ HM II Power Module Alarms, Care, Maintenance ____ Daily diary sheets ____    Care of the Percutaneous Lead    Discussed frequency and importance of INR checks; emphasized importance of maintaining INR goal to prevent clotting and or bleeding issues with pump.  Patient will have INR managed per [facility] VAD team; current INR goal is 2.0 - 2.5.   Paperwork initiated for home  INR monitor.         When to call VAD Coordinator  . Bleeding: if you have any nose bleeds, notice any blood in your urine or stool, or you have any black tarry stools.   Maryjo Rochester colored urine: such as the color of tea, coffee, or cola.  . Dizziness  . Unexplained increase in shortness of breath  . Weight gain of more than 3 pounds in 24 hours or 5 pounds in a week  . Changes in driveline site: increase in redness, tenderness, drainage, or odor  . Any trauma to driveline: such as controller drop, tug on driveline, etc  . Temperature of 100 F or greater or any chills  . Experience any problems with your batteries maintaining their charge or unable to calibrate  . Notice that your pump power suddenly increases to 10 watts or greater  . Notice yellow wrench visual alarms on your power module or system controller  . Suddenly can't perform a system controller self test  . Any red heart alarms or yellow wrench symbols   If you have any of the above problems contact your VAD Coordinator at [phone]  The patient and caregivers have completed a proficiency test for the HM II and all questions have been answered. The pt and family have been instructed to call if any questions, problems, or concerns arise. Pt and caregiver successfully paged VAD coordinator using VAD pager emergency number and have been instructed to use this number only for emergencies. Patient and caregivers asked appropriate questions, had good interaction with VAD coordinator, and verbalized understanding of above instructions.

## 2015-05-27 NOTE — Progress Notes (Signed)
HeartMate 2 Rounding Note     Postop day #17 HeartMate 2 LVAD implantation   Subjective:   History of nonischemic cardiomyopathy, ejection fraction 15% Preoperative cardiogenic shock on balloon pump and dual inotropes Preoperative protein deficiency malnutrition with prealbumin 8.4 Preoperative moderate RV dysfunction Preoperative hepatic insufficiency with bilirubin of 4.2 Status post AICD implantation 2002  Postop a-fib resolved on amio, EPWs removed Postop driveline site infection- cellulitis with elevated WBC- improved on iv vanc and Primaxin Postop jaundice- bilirubin  now 3.0 Postop nausea, poor appetite- better off po iron Walking again with PT  -walk 300 feet today Postop RV failure better with revatio and nsr - CVP 8   and milrinone weaned off with co-ox 60%  Echo-speed study performed showing improved RV function and pump speed increased to 9400 RPM. Pericardial effusion is also better. LVAD INTERROGATION:  HeartMate II LVAD:  Flow 5.1 liters/min, speed 9400, power 5.8 PI 6.8  Controller  intact   Objective:    Vital Signs:   Temp:  [97.5 F (36.4 C)-98.8 F (37.1 C)] 98.8 F (37.1 C) (02/17 1600) Pulse Rate:  [92-100] 97 (02/17 1600) Resp:  [15-26] 21 (02/17 1600) BP: (101-107)/(84-92) 107/88 mmHg (02/17 0800) SpO2:  [94 %-99 %] 99 % (02/17 1600) Weight:  [169 lb 15.6 oz (77.1 kg)] 169 lb 15.6 oz (77.1 kg) (02/17 0500) Last BM Date: 05/25/15 Mean arterial Pressure  85-95  Intake/Output:   Intake/Output Summary (Last 24 hours) at 05/27/15 1708 Last data filed at 05/27/15 1646  Gross per 24 hour  Intake   1330 ml  Output   2000 ml  Net   -670 ml     Physical Exam: General:  Well appearing. No resp difficulty, mildly jaundiced skin HEENT: normal Neck: supple. JVP . Carotids 2+ bilat; no bruits. No lymphadenopathy or thryomegaly appreciated. Cor: Mechanical heart sounds with LVAD hum present. Lungs: clear Abdomen: soft, nontender, nondistended. No  hepatosplenomegaly. No bruits or masses. Good bowel sounds. Extremities: no cyanosis, clubbing, rash, edema Neuro: alert & orientedx3, cranial nerves grossly intact. moves all 4 extremities w/o difficulty. Affect pleasant  Telemetry: sinus tach  Labs: Basic Metabolic Panel:  Recent Labs Lab 05/23/15 0403 05/24/15 0600 05/25/15 0440 05/26/15 0535 05/27/15 0611  NA 125* 128* 128* 129* 129*  K 3.4* 3.4* 3.8 3.7 3.5  CL 92* 95* 96* 95* 96*  CO2 23 23 24 24 24   GLUCOSE 154* 100* 96 89 97  BUN 8 7 6 6 8   CREATININE 0.60* 0.60* 0.54* 0.62 0.63  CALCIUM 7.8* 8.3* 8.1* 8.2* 8.1*    Liver Function Tests:  Recent Labs Lab 05/23/15 0403 05/24/15 0600 05/25/15 0440 05/26/15 0535 05/27/15 0611  AST 65* 63* 60* 62* 64*  ALT 35 33 32 32 31  ALKPHOS 132* 120 122 116 128*  BILITOT 5.3* 4.3* 3.8* 3.8* 3.7*  PROT 5.3* 5.9* 5.7* 5.7* 6.0*  ALBUMIN 2.2* 2.4* 2.4* 2.4* 2.5*   No results for input(s): LIPASE, AMYLASE in the last 168 hours. No results for input(s): AMMONIA in the last 168 hours.  CBC:  Recent Labs Lab 05/23/15 0403 05/24/15 0600 05/25/15 0440 05/26/15 0535 05/27/15 0611  WBC 18.8* 16.1* 13.5* 12.5* 12.2*  HGB 9.2* 9.9* 9.7* 9.7* 10.3*  HCT 27.7* 28.7* 27.9* 29.2* 30.7*  MCV 95.5 97.3 97.6 96.4 96.2  PLT 418* 380 349 379 376    INR:  Recent Labs Lab 05/23/15 0403 05/24/15 0600 05/25/15 0440 05/26/15 0535 05/27/15 0435  INR 2.11* 2.29* 2.65* 2.56*  2.94*    Other results:  EKG:   Imaging: Dg Chest Port 1 View  05/27/2015  CLINICAL DATA:  Shortness of breath, ischemic cardiomyopathy with acute and chronic CHF, cardiogenic shock, left ventricular assist device. EXAM: PORTABLE CHEST 1 VIEW COMPARISON:  Portable chest x-ray of May 25, 2015 FINDINGS: The lungs are adequately inflated. There is persistent retrocardiac atelectasis or pneumonia. The left hemidiaphragm remains obscured. Left perihilar subsegmental atelectasis persists. There is a small  right pleural effusion and mild right basilar atelectasis slightly less conspicuous today. The cardiac silhouette remains enlarged. The left ventricular assist device is in stable position radiographically. The permanent pacemaker defibrillator is in stable position. The left-sided PICC line tip projects over the distal third of the SVC. IMPRESSION: Mild overall improvement in the appearance of the chest with slightly decreased pleural fluid and decreased interstitial edema and pulmonary vascular congestion. No pneumothorax is evident. The support apparatus appears to be in stable position. Electronically Signed   By: David  Martinique M.D.   On: 05/27/2015 07:42     Medications:     Scheduled Medications: . ALPRAZolam  0.25 mg Oral QHS  . amiodarone  200 mg Oral BID  . antiseptic oral rinse  7 mL Mouth Rinse BID  . aspirin EC  81 mg Oral Daily  . benzonatate  100 mg Oral BID  . bisacodyl  10 mg Oral Daily   Or  . bisacodyl  10 mg Rectal Daily  . citalopram  20 mg Oral Daily  . digoxin  0.125 mg Oral Daily  . docusate sodium  200 mg Oral Daily  . feeding supplement  1 Container Oral TID BM  . fluticasone  1 spray Each Nare Daily  . furosemide  40 mg Oral Daily  . guaiFENesin  600 mg Oral BID  . guaiFENesin-dextromethorphan  5 mL Oral QHS  . imipenem-cilastatin  500 mg Intravenous Q6H  . loratadine  10 mg Oral Daily  . losartan  12.5 mg Oral Daily  . montelukast  10 mg Oral QHS  . pantoprazole  40 mg Oral Daily  . sildenafil  40 mg Oral TID  . sodium chloride flush  10 mL Intravenous Q12H  . sodium chloride flush  3 mL Intravenous Q12H  . spironolactone  25 mg Oral Daily  . vancomycin  1,000 mg Intravenous Q12H  . Warfarin - Pharmacist Dosing Inpatient   Does not apply q1800    Infusions: . sodium chloride Stopped (05/27/15 1000)  . sodium chloride 250 mL (05/27/15 1000)    PRN Medications: hydrALAZINE, HYDROcodone-acetaminophen, ondansetron (ZOFRAN) IV, sodium chloride flush,  sodium chloride flush, sodium chloride flush, zolpidem   Assessment:  IR tunneled PICC line placed and triple-lumen central line removed Elevated white count improving on antibiotics treating infection-cellulitis around driveline exit site. We'll leave on IV antibiotics another 48 hours and then start by mouth Keflex.   Postoperative jaundice slowly improving bilirubin <4  Main surgical incisions appear to be healing. Ready for transfer to stepdown, waiting for bed  Plan/Discussion:   Continue diuresis with lasix 40 mg by mouth daily  Continue IV antibiotics until Sunday     Patient's condition is currently stable and hospital benefit almost optimized. Should be ready to go home early next week. We'll transition from IV to po antibiotics since driveline exit site cellulitis is significantly better.    I reviewed the LVAD parameters from today, and compared the results to the patient's prior recorded data.  No programming changes  were made.  The LVAD is functioning within specified parameters.  The patient performs LVAD self-test daily.  LVAD interrogation was negative for any significant power changes, alarms or PI events/speed drops.  LVAD equipment check completed and is in good working order.  Back-up equipment present.   LVAD education done on emergency procedures and precautions and reviewed exit site care.  Length of Stay: Davis III 05/27/2015, 5:08 PM

## 2015-05-27 NOTE — Progress Notes (Signed)
RN, patient, and pt's wife spoke with Dr. Prescott Gum regarding twitching and hallucinations and talking during his sleep; orders to d/c Celexa at this time; will cont. To monitor.  Patrick Humphrey

## 2015-05-27 NOTE — Progress Notes (Signed)
  LVAD Exit Site:  Demonstrated dressing change to caregiver, Patrick Humphrey.   VAD dressing removed and site care performed using sterile technique. Drive line exit site cleaned with Chlora prep applicators x 2, allowed to dry, gauze dressing re-applied. Will leave aquacel strip off due to local skin irritaton and see if it improves. Exit site well with minimal tissue ingrowth, one suture remains intact. The velour is fully implanted at exit site. Erythema under silver strip with less serous drainage noted; no tenderness or foul odor noted. Drive line anchor re-applied. Driveline dressing is being changed daily per sterile technique.

## 2015-05-27 NOTE — Progress Notes (Signed)
ANTICOAGULATION CONSULT NOTE - Follow Up Consult  Pharmacy Consult for Coumadin Indication: s/p LVAD  Patient Measurements: Height: 5\' 11"  (180.3 cm) Weight: 169 lb 15.6 oz (77.1 kg) IBW/kg (Calculated) : 75.3  Vital Signs: Temp: 97.5 F (36.4 C) (02/17 0900) Temp Source: Oral (02/17 0900) BP: 107/88 mmHg (02/17 0800) Pulse Rate: 92 (02/17 0800)  Labs:  Recent Labs  05/25/15 0440 05/26/15 0535 05/27/15 0435 05/27/15 0611  HGB 9.7* 9.7*  --  10.3*  HCT 27.9* 29.2*  --  30.7*  PLT 349 379  --  376  LABPROT 27.9* 27.2* 30.1*  --   INR 2.65* 2.56* 2.94*  --   CREATININE 0.54* 0.62  --  0.63    Estimated Creatinine Clearance: 117.7 mL/min (by C-G formula based on Cr of 0.63).   Assessment: 50 year old male with long-standing history of chronic systolic heart failure, nonischemic cardiomyopathy, ICD, Dr. Gwenlyn Found, who has been experiencing increasing dyspnea consistent with acute on chronic systolic heart failure.  LVAD 1/31  AC: heparin off post vad. INR 2.9 today, Cbc stable. Continue to monitor for drug interaction with Amio and new fluconazole (stopped 2/16). Increasing INR could be d/t fluconazole but should see any effects from that wearing off soon. Will give half dose tonight.  ID: no fevers, wbc stable 12 (stable),CXR persistent bibasilar opacities.Cover driveline infection with vanco/primaxin (per MD). Vancomycin trough still within desired goal, will continue without adjustment. No drainage around drive line.  2/10 VT -17 - no changes 2/15 VT - 14 - no changes  Vanc 2/7>> Fortaz 2/9>>2/12 Primaxin 2/12>> Fluconazole 2/11>2/15  1/29 mrsa pcr - neg 2/11 BC x 2>>ngF 2/11 Urine - ngF 2/14 wound (driveline) - ngF  Goal of Therapy:  INR 2-2.5  Vancomycin level 10-20 Monitor platelets by anticoagulation protocol: Yes   Plan:  -Warfarin - 0.5 mg tonight -Vancomycin 1g q12 for now -Continue Primaxin 500mg  IV q6 hours -Continue IV abx until driveline site  improves per surgery  Erin Hearing PharmD., BCPS Clinical Pharmacist Pager [phone] 05/27/2015 9:31 AM

## 2015-05-27 NOTE — Progress Notes (Signed)
Patient ID: Patrick Humphrey, male   DOB: 27-Sep-1965, 50 y.o.   MRN: [id number] HeartMate 2 Rounding Note  Subjective:    LVAD placement 05/10/15.   Extubated 2/1.  Got 2 units PRBCs 1/31. Today's Hgb 10.3.    05/18/15 S/P TEE and successful DC-CV. Left  ventricle was mildly dilated with EF 15% and septal akinesis. The LVAD inflow cannula was visualized at the apex. The RV with mildly dilated with severe systolic dysfunction. Pacemaker in RV. There was trivial TR. The aortic valve did not open. There was mild aortic insufficiency. Trivial TR. Mildly dilated LA with no LA appendage thrombus. Normal RA. Moderate pericardial effusion along the LV lateral wall, no tamponade. LVAD speed increased to 9200.   05/25/15 ECHO with improved RV funciton. LVAD speed increased to 9400.   Pre-op 4.2 => post-op 9.8 =>now down to 3.7   Yesterday milrinone was stopped, switched to po lasix and spiro was increased to 25 mg daily.   Remains in NSR today.  WBCs down to 12.2.  Blood - NGTD. Urine culture negative. He is on broad spectrum abx. Remains afebrile.  Ambulated 200 feet.   Denies SOB.    LVAD INTERROGATION:  HeartMate II LVAD:  Flow 5.2 liters/min, speed 9400,  power 7, PI 5.7.  1 PI event  Objective:    Vital Signs:   Temp:  [98.4 F (36.9 C)-98.8 F (37.1 C)] 98.5 F (36.9 C) (02/17 0404) Resp:  [24-30] 24 (02/16 2000) BP: (101-112)/(84-93) 105/92 mmHg (02/16 2000) SpO2:  [98 %] 98 % (02/16 1600) Weight:  [169 lb 15.6 oz (77.1 kg)] 169 lb 15.6 oz (77.1 kg) (02/17 0500) Last BM Date: 05/25/15 Mean arterial Pressure 90s   Intake/Output:   Intake/Output Summary (Last 24 hours) at 05/27/15 0712 Last data filed at 05/27/15 0500  Gross per 24 hour  Intake   1203 ml  Output   1300 ml  Net    -97 ml     Physical Exam: CVP 5-6 General:  NAD. No resp difficulty. In the chair bed   HEENT: normal Neck: supple. JVP 5-6 . Carotids 2+ bilat; no bruits. No thryomegaly or nodule noted.  .  Cor: Mechanical heart sounds with LVAD hum present. Lungs: Clear.  Abdomen: soft, NT, ND, no HSM. No bruits or masses. +BS  Driveline: C/D/I; securement device intact and driveline incorporated Extremities: no cyanosis, clubbing, rash, R and LLE ted hose. R and L ankle trace edema. LUE PICC.  Neuro: alert & orientedx3, cranial nerves grossly intact. moves all 4 extremities w/o difficulty. Affect pleasant  Telemetry: SR 90-100s     Labs: Basic Metabolic Panel:  Recent Labs Lab 05/23/15 0403 05/24/15 0600 05/25/15 0440 05/26/15 0535 05/27/15 0611  NA 125* 128* 128* 129* 129*  K 3.4* 3.4* 3.8 3.7 3.5  CL 92* 95* 96* 95* 96*  CO2 23 23 24 24 24   GLUCOSE 154* 100* 96 89 97  BUN 8 7 6 6 8   CREATININE 0.60* 0.60* 0.54* 0.62 0.63  CALCIUM 7.8* 8.3* 8.1* 8.2* 8.1*    Liver Function Tests:  Recent Labs Lab 05/23/15 0403 05/24/15 0600 05/25/15 0440 05/26/15 0535 05/27/15 0611  AST 65* 63* 60* 62* 64*  ALT 35 33 32 32 31  ALKPHOS 132* 120 122 116 128*  BILITOT 5.3* 4.3* 3.8* 3.8* 3.7*  PROT 5.3* 5.9* 5.7* 5.7* 6.0*  ALBUMIN 2.2* 2.4* 2.4* 2.4* 2.5*   No results for input(s): LIPASE, AMYLASE in the last 168 hours. No  results for input(s): AMMONIA in the last 168 hours.  CBC:  Recent Labs Lab 05/23/15 0403 05/24/15 0600 05/25/15 0440 05/26/15 0535 05/27/15 0611  WBC 18.8* 16.1* 13.5* 12.5* 12.2*  HGB 9.2* 9.9* 9.7* 9.7* 10.3*  HCT 27.7* 28.7* 27.9* 29.2* 30.7*  MCV 95.5 97.3 97.6 96.4 96.2  PLT 418* 380 349 379 376    INR:  Recent Labs Lab 05/23/15 0403 05/24/15 0600 05/25/15 0440 05/26/15 0535 05/27/15 0435  INR 2.11* 2.29* 2.65* 2.56* 2.94*    Other results:  EKG:   Imaging: No results found.   Medications:     Scheduled Medications: . ALPRAZolam  0.25 mg Oral QHS  . amiodarone  200 mg Oral BID  . antiseptic oral rinse  7 mL Mouth Rinse BID  . aspirin EC  81 mg Oral Daily  . benzonatate  100 mg Oral BID  . bisacodyl  10 mg Oral  Daily   Or  . bisacodyl  10 mg Rectal Daily  . citalopram  20 mg Oral Daily  . digoxin  0.125 mg Oral Daily  . docusate sodium  200 mg Oral Daily  . feeding supplement  1 Container Oral TID BM  . fluticasone  1 spray Each Nare Daily  . furosemide  40 mg Oral Daily  . guaiFENesin  600 mg Oral BID  . guaiFENesin-dextromethorphan  5 mL Oral QHS  . imipenem-cilastatin  500 mg Intravenous Q6H  . loratadine  10 mg Oral Daily  . montelukast  10 mg Oral QHS  . pantoprazole  40 mg Oral Daily  . sildenafil  40 mg Oral TID  . sodium chloride flush  10 mL Intravenous Q12H  . sodium chloride flush  3 mL Intravenous Q12H  . spironolactone  25 mg Oral Daily  . vancomycin  1,000 mg Intravenous Q12H  . Warfarin - Pharmacist Dosing Inpatient   Does not apply q1800    Infusions: . sodium chloride 10 mL/hr at 05/26/15 2100  . sodium chloride 250 mL (05/26/15 2350)    PRN Medications: hydrALAZINE, HYDROcodone-acetaminophen, ondansetron (ZOFRAN) IV, sodium chloride flush, sodium chloride flush, sodium chloride flush, zolpidem   Assessment:   1. Nonischemic cardiomyopathy: Long-standing, EF 20-25%.  Coronary angiography this admission with no significant CAD. Cardiogenic shock requiring milrinone and norepinephrine, IABP placed. Heartmate II LVAD placed 05/10/15.  2. Post-op blood loss anemia: 2 units PRBCs 1/31.  3. Elevate bilirubin: Noted pre-op, thought due to RV failure.  Higher post-op with jaundice.  Elevated direct bilirubin, so liver source rather than hemolysis.  4. Atrial flutter: Resolved on amiodarone.  5. Hyponatremia 6. Atrial fibrillation with RVR 2/7.  TEE-guided DCCV 05/18/15.  7. RV failure: Revatio begun 2/7.  8. Small left apical PTX.  9. Elevated WBCs: Empiric vancomycin/ceftazidime and fluconazole added.  Bibasilar opacity on CXR.   Plan/Discussion:    Ramp echo on 2/15 => RV looked better, only moderately dysfunctional.  Speed increased to 9400 rpm without any compromise  to RV. Continues on sildenafil 40 mg tid, digoxin 0.125. Co-ox  Pending. Off milrinone today. PICC in place. Dig level 0.3.   S/P TEE successful DC-CV 2/8. Continue amio 200 mg twice a day.    MAP elevated 90s. CVP5-6.  Personally checked.   RV better on ECHO. Continue lasix 40 mg daily and spiro 25 mg daily. Add 12.5 mg losartan daily. Renal function stable. Give 40 meq this morning    INR today 2.94, hemoglobin stable. Continue ASA 81 mg daily.  LDH  327  WBCs trending down 12.2.  CXR persistent bibasilar opacities.  On broad spectrum antibiotics vanco/imipenem.  Blood cultures NGTD.  Urine culture negative.    Total bilirubin 3.7.  Elevation pre-op thought due to CHF/RV failure, more marked post-op. Normal alkaline phosphatase. Elevated direct bilirubin suggests liver source.   I reviewed the LVAD parameters from today, and compared the results to the patient's prior recorded data.  No programming changes were made.  The LVAD is functioning within specified parameters.  The patient performs LVAD self-test daily.  LVAD interrogation was negative for any significant power changes, alarms or PI events/speed drops.  LVAD equipment check completed and is in good working order.  Back-up equipment present.   LVAD education done on emergency procedures and precautions and reviewed exit site care.  Waiting on bed in stepdown.   Length of Stay: [place]   NP-C   05/27/2015, 7:12 AM  VAD Team --- VAD ISSUES ONLY--- Pager [phone] (7am - 7am)  Advanced Heart Failure Team  Pager [phone] (M-F; 7a - 4p)  Please contact [facility] for night-coverage after hours (4p -7a ) and weekends on [email or web address]  Patient seen with NP, agree with the above note.  He seems to be doing well.  Co-ox remains 60% off milrinone.  CVP 5-6.  Agree with addition of losartan 12.5 daily today with elevated MAP. Keep other meds the same.   Continue to walk with PT and rehab.   Hemoglobin stable, WBCs trending down.   Total bilirubin trending down.  Slight rise in LDH.   I suspect that he will be ready for discharge on Monday.   Patrick Humphrey 05/27/2015 11:08 AM

## 2015-05-27 NOTE — Care Management Note (Addendum)
Case Management Note  Patient Details  Name: Patrick Humphrey MRN: [id number] Date of Birth: June 20, 1965  Subjective/Objective:      Adm w heart failure              Action/Plan: lives w fam, pcp dr Manuella Ghazi   Expected Discharge Date:                  Expected Discharge Plan:  [place]  In-House Referral:     Discharge planning Services  CM Consult  Post Acute Care Choice:  Home Health Choice offered to:  Patient  DME Arranged:    DME Agency:  [place]:  RN, Disease Management [place] Agency:  [place]  Status of Service:     Medicare Important Message Given:  Yes Date Medicare IM Given:    Medicare IM give by:    Date Additional Medicare IM Given:    Additional Medicare Important Message give by:     If discussed at [place] of Stay Meetings, dates discussed:    Additional Comments:will need home milrinone. Went over agency list and no pref. Lives [place] and has [employer]. Ref to [facility] for home iv milrinone.  Maryclare Labrador, RN 05/27/2015, 9:29 AM Case Management Note  Patient Details  Name: Patrick Humphrey MRN: [id number] Date of Birth: [birth year]  Subjective/Objective:    Pt is s/p LVAD implant 05/10/15                Action/Plan: 05/27/2015 Elenor Quinones, RN, BSN  [phone] CM provided referral to [facility] for HF [facility] order including nurse, pt is no longer on Milrinone drip.  CM submitted beneift check for Revatio.  CM will continue to monitor for disposition needs   Expected Discharge Date:                  Expected Discharge Plan:  [place]  In-House Referral:     Discharge planning Services  CM Consult  Post Acute Care Choice:  Home Health Choice offered to:  Patient  DME Arranged:    DME Agency:  [place]:  RN, Disease Management [place] Agency:  [place]  Status of Service:     Medicare Important Message Given:   Yes Date Medicare IM Given:    Medicare IM give by:    Date Additional Medicare IM Given:    Additional Medicare Important Message give by:     If discussed at [place] of Stay Meetings, dates discussed:    Additional Comments: Revatio Benefit Check [name] @ [employer] # [phone]   REVALTO 20 MG TID  30 DAY SUPPLY   COVER- YES  CO-PAY- $ 47.03  AND A 90 DAY SUPPLY $ 137.04  TIER- 3 DRUG  PRIOR APPROVAL - NO  PHARMACY: [facility]   REVALTO 40 MG  NOT ON FORMULARY  Information provided to pt and wife. Maryclare Labrador, RN 05/27/2015, 9:29 AM

## 2015-05-27 NOTE — Care Management Important Message (Signed)
Important Message  Patient Details  Name: Patrick Humphrey MRN: [id number] Date of Birth: [birth year]   Medicare Important Message Given:  Yes    Dreyden Rohrman Abena 05/27/2015, 2:10 PM

## 2015-05-27 NOTE — Progress Notes (Signed)
Physical Therapy Treatment Patient Details Name: Patrick Humphrey MRN: [id number] DOB: [birth year] Today's Date: 05/27/2015    History of Present Illness patient is a 50 yo male with nonischemic cardiomyopathy since 2002 with acute on chronic systolic heart failure and cardiogenic shock, deteriorating on 2 inotropes and had IABP placed. Moderate RV systolic dysfunction on preop echo.  s/p HeartMate II LVAD    PT Comments    Patient making improvements with activity tolerance and mobility.  Follow Up Recommendations  Home health PT     Equipment Recommendations  Other (comment) Agricultural consultant)    Recommendations for Other Services       Precautions / Restrictions Precautions Precautions: Sternal;Fall Precaution Comments: sternal,LVAD precautions- black bag at all times Restrictions Weight Bearing Restrictions: Yes Other Position/Activity Restrictions: sternal precautions apply    Mobility  Bed Mobility Overal bed mobility: Needs Assistance;+2 for physical assistance Bed Mobility: Sit to Supine       Sit to supine: Mod assist;+2 for physical assistance   General bed mobility comments: Assist to lower trunk to bed and to bring LE's on to bed to return to supine.  Transfers Overall transfer level: Needs assistance Equipment used: None Transfers: Sit to/from Stand Sit to Stand: Min assist         General transfer comment: In sitting, patient able to transition to batteries.  Able to stand without use of UE's - use of pillow . Min A for pt to gain stability once standing.   Ambulation/Gait Ambulation/Gait assistance: Min assist;+2 safety/equipment Ambulation Distance (Feet): 300 Feet Assistive device:  (pushing w/c) Gait Pattern/deviations: Step-through pattern;Decreased stride length;Trunk flexed Gait velocity: decreased Gait velocity interpretation: Below normal speed for age/gender General Gait Details: No rest breaks required today.  Patient with max RR of 33 and  O2 sats remained in 90's.  Good balance with use of w/c for UE support.   Stairs            Wheelchair Mobility    Modified Rankin (Stroke Patients Only)       Balance                                    Cognition Arousal/Alertness: Awake/alert Behavior During Therapy: WFL for tasks assessed/performed;Anxious Overall Cognitive Status: Within Functional Limits for tasks assessed                      Exercises      General Comments General comments (skin integrity, edema, etc.): Wife not present during today's session.  Patient able to transition from A/C power <> batteries with minimal verbal cues.      Pertinent Vitals/Pain Pain Assessment: No/denies pain    Home Living                      Prior Function            PT Goals (current goals can now be found in the care plan section) Acute Rehab PT Goals Patient Stated Goal: To go home Progress towards PT goals: Progressing toward goals    Frequency  Min 4X/week    PT Plan Current plan remains appropriate    Co-evaluation             End of Session Equipment Utilized During Treatment:  (Off of O2) Activity Tolerance: Patient tolerated treatment well Patient left: in bed;with call bell/phone within reach;with family/visitor  present     Time: 1110-1147 PT Time Calculation (min) (ACUTE ONLY): 37 min  Charges:  $Gait Training: 23-37 mins                    G Codes:      Despina Pole June 15, 2015, 1:58 PM Carita Pian. Sanjuana Kava, Power Pager [phone]

## 2015-05-28 ENCOUNTER — Inpatient Hospital Stay (HOSPITAL_COMMUNITY): Payer: Medicare Other

## 2015-05-28 LAB — COMPREHENSIVE METABOLIC PANEL
ALT: 36 U/L (ref 17–63)
AST: 84 U/L — ABNORMAL HIGH (ref 15–41)
Albumin: 2.4 g/dL — ABNORMAL LOW (ref 3.5–5.0)
Alkaline Phosphatase: 169 U/L — ABNORMAL HIGH (ref 38–126)
Anion gap: 9 (ref 5–15)
BUN: 8 mg/dL (ref 6–20)
CO2: 25 mmol/L (ref 22–32)
Calcium: 8.1 mg/dL — ABNORMAL LOW (ref 8.9–10.3)
Chloride: 95 mmol/L — ABNORMAL LOW (ref 101–111)
Creatinine, Ser: 0.68 mg/dL (ref 0.61–1.24)
GFR calc Af Amer: >60 mL/min (ref >60)
GFR calc non Af Amer: >60 mL/min (ref >60)
Glucose, Bld: 96 mg/dL (ref 65–99)
Potassium: 3.6 mmol/L (ref 3.5–5.1)
Sodium: 129 mmol/L — ABNORMAL LOW (ref 135–145)
Total Bilirubin: 3.6 mg/dL — ABNORMAL HIGH (ref 0.3–1.2)
Total Protein: 5.8 g/dL — ABNORMAL LOW (ref 6.5–8.1)

## 2015-05-28 LAB — GLUCOSE, CAPILLARY: Glucose-Capillary: 94 mg/dL (ref 65–99)

## 2015-05-28 LAB — CARBOXYHEMOGLOBIN
Carboxyhemoglobin: 3 % — ABNORMAL HIGH (ref 0.5–1.5)
Methemoglobin: 0.8 % (ref 0.0–1.5)
O2 Saturation: 67.4 %
Total hemoglobin: 10 g/dL — ABNORMAL LOW (ref 13.5–18.0)

## 2015-05-28 LAB — PROTIME-INR
INR: 3 — ABNORMAL HIGH (ref 0.00–1.49)
Prothrombin Time: 30.6 seconds — ABNORMAL HIGH (ref 11.6–15.2)

## 2015-05-28 LAB — CBC
HCT: 30.2 % — ABNORMAL LOW (ref 39.0–52.0)
Hemoglobin: 10 g/dL — ABNORMAL LOW (ref 13.0–17.0)
MCH: 32.1 pg (ref 26.0–34.0)
MCHC: 33.1 g/dL (ref 30.0–36.0)
MCV: 96.8 fL (ref 78.0–100.0)
Platelets: 309 10*3/uL (ref 150–400)
RBC: 3.12 MIL/uL — ABNORMAL LOW (ref 4.22–5.81)
RDW: 17 % — ABNORMAL HIGH (ref 11.5–15.5)
WBC: 12.1 10*3/uL — ABNORMAL HIGH (ref 4.0–10.5)

## 2015-05-28 LAB — LACTATE DEHYDROGENASE: LDH: 304 U/L — ABNORMAL HIGH (ref 98–192)

## 2015-05-28 MED ORDER — CEPHALEXIN 500 MG PO CAPS
500.0000 mg | ORAL_CAPSULE | Freq: Three times a day (TID) | ORAL | Status: DC
  Administered 2015-05-29 – 2015-05-30 (×4): 500 mg via ORAL
  Filled 2015-05-28 (×4): qty 1

## 2015-05-28 MED ORDER — WARFARIN 0.5 MG HALF TABLET
0.5000 mg | ORAL_TABLET | Freq: Once | ORAL | Status: AC
  Administered 2015-05-28: 0.5 mg via ORAL
  Filled 2015-05-28 (×2): qty 1

## 2015-05-28 MED ORDER — FUROSEMIDE 40 MG PO TABS
40.0000 mg | ORAL_TABLET | ORAL | Status: DC
  Filled 2015-05-28: qty 1

## 2015-05-28 MED ORDER — SODIUM CHLORIDE 0.9% FLUSH: 10.0000 mL | INTRAVENOUS | Status: DC | PRN

## 2015-05-28 MED ORDER — MEGESTROL ACETATE 40 MG PO TABS
40.0000 mg | ORAL_TABLET | Freq: Every day | ORAL | Status: DC
  Administered 2015-05-28 – 2015-05-29 (×2): 40 mg via ORAL
  Filled 2015-05-28 (×3): qty 1

## 2015-05-28 NOTE — Progress Notes (Signed)
ANTICOAGULATION CONSULT NOTE - Follow Up Consult  Pharmacy Consult for Coumadin Indication: s/p LVAD  Patient Measurements: Height: 5\' 11"  (180.3 cm) Weight: 166 lb 0.1 oz (75.3 kg) IBW/kg (Calculated) : 75.3  Vital Signs: Temp: 98.3 F (36.8 C) (02/18 0855) Temp Source: Oral (02/18 0855) Pulse Rate: 97 (02/18 1113)  Labs:  Recent Labs  05/26/15 0535 05/27/15 0435 05/27/15 0611 05/28/15 0420  HGB 9.7*  --  10.3* 10.0*  HCT 29.2*  --  30.7* 30.2*  PLT 379  --  376 309  LABPROT 27.2* 30.1*  --  30.6*  INR 2.56* 2.94*  --  3.00*  CREATININE 0.62  --  0.63 0.68    Estimated Creatinine Clearance: 117.7 mL/min (by C-G formula based on Cr of 0.68).   Assessment: 50 year old male with long-standing history of chronic systolic heart failure, nonischemic cardiomyopathy, ICD, Dr. Gwenlyn Found, who has been experiencing increasing dyspnea consistent with acute on chronic systolic heart failure.  LVAD 1/31  AC: heparin off post vad. INR 3.0 today, Cbc stable. Continue to monitor for drug interaction with Amio and new fluconazole (stopped 2/16). Increasing INR could be d/t fluconazole but should see any effects from that wearing off soon. Will give half dose tonight.   Goal of Therapy:  INR 2-2.5  Monitor platelets by anticoagulation protocol: Yes   Plan:  Warfarin 0.5mg  tonight x1 Daily INR Monitor s/sx of bleeding  Andrey Cota. Diona Foley, PharmD, BCPS Clinical Pharmacist Pager [phone]  05/28/2015 11:55 AM

## 2015-05-28 NOTE — Progress Notes (Signed)
VAD TEAM ROUNDING NOTE  Patient ID: Patrick Humphrey, male   DOB: 11-24-1965, 50 y.o.   MRN: [id number] HeartMate 2 Rounding Note  Subjective:    LVAD placement 05/10/15.   Extubated 2/1.  Got 2 units PRBCs 1/31. Today's Hgb 10.3.    05/18/15 S/P TEE and successful DC-CV. Left  ventricle was mildly dilated with EF 15% and septal akinesis. The LVAD inflow cannula was visualized at the apex. The RV with mildly dilated with severe systolic dysfunction. Pacemaker in RV. There was trivial TR. The aortic valve did not open. There was mild aortic insufficiency. Trivial TR. Mildly dilated LA with no LA appendage thrombus. Normal RA. Moderate pericardial effusion along the LV lateral wall, no tamponade. LVAD speed increased to 9200.   05/25/15 ECHO with improved RV funciton. LVAD speed increased to 9400. Off milrinone 2/16  Good urine output on po lasix yesterday. CVP now 6. Walked hall this am. Feeling better but still weak. No SOB.Marland Kitchen + earl satiety.   Yesterday milrinone was stopped, switched to po lasix and spiro was increased to 25 mg daily. Remains in NSR today.  WBCs stable at 12.1.  Blood - NGTD. Urine culture negative.  Remains afebrile.    Denies SOB. Co-ox 67%   LVAD INTERROGATION:  HeartMate II LVAD:  Flow 5.4 liters/min, speed 9400,  power 6.2, PI 5.9.  Several  PI events  Objective:    Vital Signs:   Temp:  [98.1 F (36.7 C)-98.9 F (37.2 C)] 98.3 F (36.8 C) (02/18 0855) Pulse Rate:  [94-107] 101 (02/18 0600) Resp:  [15-26] 22 (02/18 0600) SpO2:  [93 %-99 %] 96 % (02/18 0600) Weight:  [75.3 kg (166 lb 0.1 oz)] 75.3 kg (166 lb 0.1 oz) (02/18 0500) Last BM Date: 05/27/15 Mean arterial Pressure 80s   Intake/Output:   Intake/Output Summary (Last 24 hours) at 05/28/15 1021 Last data filed at 05/28/15 0700  Gross per 24 hour  Intake   1420 ml  Output   1400 ml  Net     20 ml     Physical Exam: CVP 6 General:  NAD. No resp difficulty. In the chair bed   HEENT:  normal Neck: supple. JVP 5-6 . Carotids 2+ bilat; no bruits. No thryomegaly or nodule noted. .  Cor: Mechanical heart sounds with LVAD hum present. Lungs: Clear.  Abdomen: soft, NT, ND, no HSM. No bruits or masses. +BS  Driveline: C/D/I; securement device intact and driveline incorporated Extremities: no cyanosis, clubbing, rash, R and LLE ted hose. R and L ankle trace edema. LUE PICC.  Neuro: alert & orientedx3, cranial nerves grossly intact. moves all 4 extremities w/o difficulty. Affect pleasant  Telemetry: SR 90-100s     Labs: Basic Metabolic Panel:  Recent Labs Lab 05/24/15 0600 05/25/15 0440 05/26/15 0535 05/27/15 0611 05/28/15 0420  NA 128* 128* 129* 129* 129*  K 3.4* 3.8 3.7 3.5 3.6  CL 95* 96* 95* 96* 95*  CO2 23 24 24 24 25   GLUCOSE 100* 96 89 97 96  BUN 7 6 6 8 8   CREATININE 0.60* 0.54* 0.62 0.63 0.68  CALCIUM 8.3* 8.1* 8.2* 8.1* 8.1*    Liver Function Tests:  Recent Labs Lab 05/24/15 0600 05/25/15 0440 05/26/15 0535 05/27/15 0611 05/28/15 0420  AST 63* 60* 62* 64* 84*  ALT 33 32 32 31 36  ALKPHOS 120 122 116 128* 169*  BILITOT 4.3* 3.8* 3.8* 3.7* 3.6*  PROT 5.9* 5.7* 5.7* 6.0* 5.8*  ALBUMIN 2.4* 2.4*  2.4* 2.5* 2.4*   No results for input(s): LIPASE, AMYLASE in the last 168 hours. No results for input(s): AMMONIA in the last 168 hours.  CBC:  Recent Labs Lab 05/24/15 0600 05/25/15 0440 05/26/15 0535 05/27/15 0611 05/28/15 0420  WBC 16.1* 13.5* 12.5* 12.2* 12.1*  HGB 9.9* 9.7* 9.7* 10.3* 10.0*  HCT 28.7* 27.9* 29.2* 30.7* 30.2*  MCV 97.3 97.6 96.4 96.2 96.8  PLT 380 349 379 376 309    INR:  Recent Labs Lab 05/24/15 0600 05/25/15 0440 05/26/15 0535 05/27/15 0435 05/28/15 0420  INR 2.29* 2.65* 2.56* 2.94* 3.00*    Other results:    Imaging: Dg Chest 2 View  05/28/2015  CLINICAL DATA:  Shortness of breath. EXAM: CHEST  2 VIEW COMPARISON:  05/27/2015. FINDINGS: Stable left ventricular assist device. Mildly improved enlargement  of the cardiac silhouette. Stable bibasilar opacity. Mildly increased small bilateral pleural effusions. Stable linear density in the left mid lung zone and right lower lung zone. Stable right subclavian AICD lead and left PICC. Lower thoracic spine degenerative changes. IMPRESSION: 1. Stable bibasilar atelectasis and possible pneumonia. 2. Mildly improved cardiomegaly. 3. Mild increase in size of small bilateral pleural effusions. 4. Stable bilateral linear atelectasis or scarring. Electronically Signed   By: Claudie Revering M.D.   On: 05/28/2015 10:18   Dg Chest Port 1 View  05/27/2015  CLINICAL DATA:  Shortness of breath, ischemic cardiomyopathy with acute and chronic CHF, cardiogenic shock, left ventricular assist device. EXAM: PORTABLE CHEST 1 VIEW COMPARISON:  Portable chest x-ray of May 25, 2015 FINDINGS: The lungs are adequately inflated. There is persistent retrocardiac atelectasis or pneumonia. The left hemidiaphragm remains obscured. Left perihilar subsegmental atelectasis persists. There is a small right pleural effusion and mild right basilar atelectasis slightly less conspicuous today. The cardiac silhouette remains enlarged. The left ventricular assist device is in stable position radiographically. The permanent pacemaker defibrillator is in stable position. The left-sided PICC line tip projects over the distal third of the SVC. IMPRESSION: Mild overall improvement in the appearance of the chest with slightly decreased pleural fluid and decreased interstitial edema and pulmonary vascular congestion. No pneumothorax is evident. The support apparatus appears to be in stable position. Electronically Signed   By: David  Martinique M.D.   On: 05/27/2015 07:42     Medications:     Scheduled Medications: . ALPRAZolam  0.25 mg Oral QHS  . amiodarone  200 mg Oral BID  . antiseptic oral rinse  7 mL Mouth Rinse BID  . aspirin EC  81 mg Oral Daily  . benzonatate  100 mg Oral BID  . bisacodyl  10 mg  Oral Daily   Or  . bisacodyl  10 mg Rectal Daily  . [START ON 05/29/2015] cephALEXin  500 mg Oral 3 times per day  . digoxin  0.125 mg Oral Daily  . docusate sodium  200 mg Oral Daily  . feeding supplement  1 Container Oral TID BM  . fluticasone  1 spray Each Nare Daily  . furosemide  40 mg Oral Daily  . guaiFENesin  600 mg Oral BID  . guaiFENesin-dextromethorphan  5 mL Oral QHS  . imipenem-cilastatin  500 mg Intravenous Q6H  . loratadine  10 mg Oral Daily  . losartan  12.5 mg Oral Daily  . megestrol  40 mg Oral Daily  . montelukast  10 mg Oral QHS  . pantoprazole  40 mg Oral Daily  . sildenafil  40 mg Oral TID  . sodium  chloride flush  10 mL Intravenous Q12H  . sodium chloride flush  3 mL Intravenous Q12H  . spironolactone  25 mg Oral Daily  . vancomycin  1,000 mg Intravenous Q12H  . Warfarin - Pharmacist Dosing Inpatient   Does not apply q1800    Infusions: . sodium chloride Stopped (05/27/15 1000)  . sodium chloride 250 mL (05/27/15 1000)    PRN Medications: hydrALAZINE, HYDROcodone-acetaminophen, ondansetron (ZOFRAN) IV, sodium chloride flush, sodium chloride flush, sodium chloride flush   Assessment:   1. Nonischemic cardiomyopathy: Long-standing, EF 20-25%.  Coronary angiography this admission with no significant CAD. Cardiogenic shock requiring milrinone and norepinephrine, IABP placed. Heartmate II LVAD placed 05/10/15.  2. Post-op blood loss anemia: 2 units PRBCs 1/31.  3. Elevate bilirubin: Noted pre-op, thought due to RV failure.  Higher post-op with jaundice.  Elevated direct bilirubin, so liver source rather than hemolysis.  4. Atrial flutter: Resolved on amiodarone.  5. Hyponatremia 6. Atrial fibrillation with RVR 2/7.  TEE-guided DCCV 05/18/15.  7. RV failure: Revatio begun 2/7.  8. Small left apical PTX.  9. Elevated WBCs: On imipenem  Plan/Discussion:    Continues to improve. Walking halls. CVP and co-ox stable. CVP on low end so will switch lasix to every  other day. Will follow.  LDH coming back down. INR 3.0.   WBC stable on vanc/imipenem. Dr. Prescott Gum planning to switch to Keflex soon.   Bilirubin down to 3.6.  Can go to circle bed. Possibly home Monday.   I reviewed the LVAD parameters from today, and compared the results to the patient's prior recorded data.  No programming changes were made.  The LVAD is functioning within specified parameters.  The patient performs LVAD self-test daily.  LVAD interrogation was negative for any significant power changes, alarms or PI events/speed drops.  LVAD equipment check completed and is in good working order.  Back-up equipment present.   LVAD education done on emergency procedures and precautions and reviewed exit site care.  Waiting on bed in stepdown.   Length of Stay: 29  Glori Bickers   MD  05/28/2015, 10:21 AM  VAD Team --- VAD ISSUES ONLY--- Pager [phone] (7am - 7am)  Advanced Heart Failure Team  Pager [phone] (M-F; 7a - 4p)  Please contact [facility] for night-coverage after hours (4p -7a ) and weekends on [email or web address]

## 2015-05-29 LAB — CARBOXYHEMOGLOBIN
Carboxyhemoglobin: 4.7 % — ABNORMAL HIGH (ref 0.5–1.5)
Methemoglobin: 0.7 % (ref 0.0–1.5)
O2 Saturation: 64.5 %
Total hemoglobin: 9.6 g/dL — ABNORMAL LOW (ref 13.5–18.0)

## 2015-05-29 LAB — COMPREHENSIVE METABOLIC PANEL
ALT: 38 U/L (ref 17–63)
AST: 87 U/L — ABNORMAL HIGH (ref 15–41)
Albumin: 2.4 g/dL — ABNORMAL LOW (ref 3.5–5.0)
Alkaline Phosphatase: 157 U/L — ABNORMAL HIGH (ref 38–126)
Anion gap: 8 (ref 5–15)
BUN: 7 mg/dL (ref 6–20)
CO2: 24 mmol/L (ref 22–32)
Calcium: 8 mg/dL — ABNORMAL LOW (ref 8.9–10.3)
Chloride: 97 mmol/L — ABNORMAL LOW (ref 101–111)
Creatinine, Ser: 0.58 mg/dL — ABNORMAL LOW (ref 0.61–1.24)
GFR calc Af Amer: >60 mL/min (ref >60)
GFR calc non Af Amer: >60 mL/min (ref >60)
Glucose, Bld: 94 mg/dL (ref 65–99)
Potassium: 3.7 mmol/L (ref 3.5–5.1)
Sodium: 129 mmol/L — ABNORMAL LOW (ref 135–145)
Total Bilirubin: 4.2 mg/dL — ABNORMAL HIGH (ref 0.3–1.2)
Total Protein: 5.5 g/dL — ABNORMAL LOW (ref 6.5–8.1)

## 2015-05-29 LAB — CBC
HCT: 28.6 % — ABNORMAL LOW (ref 39.0–52.0)
Hemoglobin: 9.4 g/dL — ABNORMAL LOW (ref 13.0–17.0)
MCH: 32 pg (ref 26.0–34.0)
MCHC: 32.9 g/dL (ref 30.0–36.0)
MCV: 97.3 fL (ref 78.0–100.0)
Platelets: 267 10*3/uL (ref 150–400)
RBC: 2.94 MIL/uL — ABNORMAL LOW (ref 4.22–5.81)
RDW: 16.6 % — ABNORMAL HIGH (ref 11.5–15.5)
WBC: 10.7 10*3/uL — ABNORMAL HIGH (ref 4.0–10.5)

## 2015-05-29 LAB — PROTIME-INR
INR: 2.62 — ABNORMAL HIGH (ref 0.00–1.49)
Prothrombin Time: 27.6 seconds — ABNORMAL HIGH (ref 11.6–15.2)

## 2015-05-29 LAB — LACTATE DEHYDROGENASE: LDH: 285 U/L — ABNORMAL HIGH (ref 98–192)

## 2015-05-29 MED ORDER — FUROSEMIDE 40 MG PO TABS
40.0000 mg | ORAL_TABLET | ORAL | Status: DC
  Filled 2015-05-29: qty 1

## 2015-05-29 MED ORDER — ACETAMINOPHEN 325 MG PO TABS
650.0000 mg | ORAL_TABLET | Freq: Four times a day (QID) | ORAL | Status: DC | PRN
  Administered 2015-05-29: 650 mg via ORAL
  Filled 2015-05-29: qty 2

## 2015-05-29 MED ORDER — WARFARIN SODIUM 2 MG PO TABS
1.0000 mg | ORAL_TABLET | Freq: Once | ORAL | Status: AC
  Administered 2015-05-29: 1 mg via ORAL
  Filled 2015-05-29: qty 0.5

## 2015-05-29 NOTE — Progress Notes (Signed)
ANTICOAGULATION CONSULT NOTE - Follow Up Consult  Pharmacy Consult for Coumadin Indication: s/p LVAD  Patient Measurements: Height: 5\' 11"  (180.3 cm) Weight: 161 lb 9.6 oz (73.3 kg) IBW/kg (Calculated) : 75.3  Vital Signs: Temp: 98.4 F (36.9 C) (02/19 0726) Temp Source: Oral (02/19 0726)  Labs:  Recent Labs  05/27/15 0435  05/27/15 0611 05/28/15 0420 05/29/15 0415  HGB  --   < > 10.3* 10.0* 9.4*  HCT  --   --  30.7* 30.2* 28.6*  PLT  --   --  376 309 267  LABPROT 30.1*  --   --  30.6* 27.6*  INR 2.94*  --   --  3.00* 2.62*  CREATININE  --   --  0.63 0.68 0.58*  < > = values in this interval not displayed.  Estimated Creatinine Clearance: 114.5 mL/min (by C-G formula based on Cr of 0.58).   Assessment: 50 year old male with long-standing history of chronic systolic heart failure, nonischemic cardiomyopathy, ICD, Dr. Gwenlyn Found, who has been experiencing increasing dyspnea consistent with acute on chronic systolic heart failure.  LVAD 1/31  AC: heparin off post vad. INR 2.62 today, Cbc stable. Continue to monitor for drug interaction with Amio and new fluconazole (stopped 2/16). Increasing INR could be d/t fluconazole but should see any effects from that wearing off soon.    Goal of Therapy:  INR 2-2.5  Monitor platelets by anticoagulation protocol: Yes   Plan:  Warfarin 1mg  tonight x1 Daily INR Monitor s/sx of bleeding  Joya San, PharmD Clinical Pharmacy Resident Pager # [phone] 05/29/2015 9:29 AM

## 2015-05-29 NOTE — Progress Notes (Signed)
VAD TEAM ROUNDING NOTE  Patient ID: Patrick Humphrey, male   DOB: [birth year], 50 y.o.   MRN: [id number] HeartMate 2 Rounding Note  Subjective:    Feels better. Lasix held today due to CVP 4. Dr. Prescott Gum switching abx to po keflex. Co-ox 65%. Weight down to 161.   LVAD INTERROGATION:  HeartMate II LVAD:  Flow 5.0 liters/min, speed 9400,  power 6.0, PI 6.6Several  PI events  Objective:    Vital Signs:   Temp:  [98.4 F (36.9 C)-99.8 F (37.7 C)] 98.6 F (37 C) (02/19 1114) Pulse Rate:  [99-106] 100 (02/19 1114) Resp:  [18-28] 21 (02/19 0930) SpO2:  [98 %-99 %] 99 % (02/19 0500) Weight:  [73.3 kg (161 lb 9.6 oz)] 73.3 kg (161 lb 9.6 oz) (02/19 0500) Last BM Date: 05/28/15 Mean arterial Pressure 80-90  Intake/Output:   Intake/Output Summary (Last 24 hours) at 05/29/15 1324 Last data filed at 05/29/15 1000  Gross per 24 hour  Intake    430 ml  Output   1250 ml  Net   -820 ml     Physical Exam: General:  NAD. No resp difficulty. In the chair bed   HEENT: normal Neck: supple. JVP 4-5 Cor: Mechanical heart sounds with LVAD hum present. Lungs: Clear.  Abdomen: soft, NT, ND, no HSM. No bruits or masses. +BS  Driveline: C/D/I; securement device intact and driveline incorporated Extremities: no cyanosis, clubbing, rash, R and LLE ted hose. R and L ankle trace edema. LUE PICC.  Neuro: alert & orientedx3, cranial nerves grossly intact. moves all 4 extremities w/o difficulty. Affect pleasant  Telemetry: SR 90-100s     Labs: Basic Metabolic Panel:  Recent Labs Lab 05/25/15 0440 05/26/15 0535 05/27/15 0611 05/28/15 0420 05/29/15 0415  NA 128* 129* 129* 129* 129*  K 3.8 3.7 3.5 3.6 3.7  CL 96* 95* 96* 95* 97*  CO2 24 24 24 25 24   GLUCOSE 96 89 97 96 94  BUN 6 6 8 8 7   CREATININE 0.54* 0.62 0.63 0.68 0.58*  CALCIUM 8.1* 8.2* 8.1* 8.1* 8.0*    Liver Function Tests:  Recent Labs Lab 05/25/15 0440 05/26/15 0535 05/27/15 0611 05/28/15 0420 05/29/15 0415  AST  60* 62* 64* 84* 87*  ALT 32 32 31 36 38  ALKPHOS 122 116 128* 169* 157*  BILITOT 3.8* 3.8* 3.7* 3.6* 4.2*  PROT 5.7* 5.7* 6.0* 5.8* 5.5*  ALBUMIN 2.4* 2.4* 2.5* 2.4* 2.4*   No results for input(s): LIPASE, AMYLASE in the last 168 hours. No results for input(s): AMMONIA in the last 168 hours.  CBC:  Recent Labs Lab 05/25/15 0440 05/26/15 0535 05/27/15 0611 05/28/15 0420 05/29/15 0415  WBC 13.5* 12.5* 12.2* 12.1* 10.7*  HGB 9.7* 9.7* 10.3* 10.0* 9.4*  HCT 27.9* 29.2* 30.7* 30.2* 28.6*  MCV 97.6 96.4 96.2 96.8 97.3  PLT 349 379 376 309 267    INR:  Recent Labs Lab 05/25/15 0440 05/26/15 0535 05/27/15 0435 05/28/15 0420 05/29/15 0415  INR 2.65* 2.56* 2.94* 3.00* 2.62*    Other results:    Imaging: Dg Chest 2 View  05/28/2015  CLINICAL DATA:  Shortness of breath. EXAM: CHEST  2 VIEW COMPARISON:  05/27/2015. FINDINGS: Stable left ventricular assist device. Mildly improved enlargement of the cardiac silhouette. Stable bibasilar opacity. Mildly increased small bilateral pleural effusions. Stable linear density in the left mid lung zone and right lower lung zone. Stable right subclavian AICD lead and left PICC. Lower thoracic spine degenerative changes.  IMPRESSION: 1. Stable bibasilar atelectasis and possible pneumonia. 2. Mildly improved cardiomegaly. 3. Mild increase in size of small bilateral pleural effusions. 4. Stable bilateral linear atelectasis or scarring. Electronically Signed   By: Claudie Revering M.D.   On: 05/28/2015 10:18     Medications:     Scheduled Medications: . ALPRAZolam  0.25 mg Oral QHS  . amiodarone  200 mg Oral BID  . antiseptic oral rinse  7 mL Mouth Rinse BID  . aspirin EC  81 mg Oral Daily  . benzonatate  100 mg Oral BID  . bisacodyl  10 mg Oral Daily   Or  . bisacodyl  10 mg Rectal Daily  . cephALEXin  500 mg Oral 3 times per day  . digoxin  0.125 mg Oral Daily  . docusate sodium  200 mg Oral Daily  . feeding supplement  1 Container Oral  TID BM  . fluticasone  1 spray Each Nare Daily  . furosemide  40 mg Oral QODAY  . guaiFENesin  600 mg Oral BID  . guaiFENesin-dextromethorphan  5 mL Oral QHS  . loratadine  10 mg Oral Daily  . losartan  12.5 mg Oral Daily  . megestrol  40 mg Oral Daily  . montelukast  10 mg Oral QHS  . pantoprazole  40 mg Oral Daily  . sildenafil  40 mg Oral TID  . sodium chloride flush  10 mL Intravenous Q12H  . sodium chloride flush  3 mL Intravenous Q12H  . spironolactone  25 mg Oral Daily  . warfarin  1 mg Oral ONCE-1800  . Warfarin - Pharmacist Dosing Inpatient   Does not apply q1800    Infusions: . sodium chloride Stopped (05/27/15 1000)  . sodium chloride Stopped (05/29/15 1000)    PRN Medications: hydrALAZINE, HYDROcodone-acetaminophen, ondansetron (ZOFRAN) IV, sodium chloride flush, sodium chloride flush, sodium chloride flush, sodium chloride flush   Assessment:   1. Nonischemic cardiomyopathy: Long-standing, EF 20-25%.  Coronary angiography this admission with no significant CAD. Cardiogenic shock requiring milrinone and norepinephrine, IABP placed. Heartmate II LVAD placed 05/10/15.  2. Post-op blood loss anemia: 2 units PRBCs 1/31.  3. Elevate bilirubin: Noted pre-op, thought due to RV failure.  Higher post-op with jaundice.  Elevated direct bilirubin, so liver source rather than hemolysis.  4. Atrial flutter: Resolved on amiodarone.  5. Hyponatremia 6. Atrial fibrillation with RVR 2/7.  TEE-guided DCCV 05/18/15.  7. RV failure: Revatio begun 2/7.  8. Small left apical PTX.  9. Elevated WBCs: On imipenem  Plan/Discussion:    Continues to improve. Walking halls. CVP and co-ox stable. CVP on low end so will switch lasix to every other day. Will follow.  LDH coming back down. INR 2.6  WBC stable on vanc/imipenem. Dr. Prescott Gum planning to switch to Keflex soon.   Bilirubin .6 -> 4.2  Hopelly home in am  I reviewed the LVAD parameters from today, and compared the results to  the patient's prior recorded data.  No programming changes were made.  The LVAD is functioning within specified parameters.  The patient performs LVAD self-test daily.  LVAD interrogation was negative for any significant power changes, alarms or PI events/speed drops.  LVAD equipment check completed and is in good working order.  Back-up equipment present.   LVAD education done on emergency procedures and precautions and reviewed exit site care.  Length of Stay: 30  Glori Bickers   MD  05/29/2015, 1:24 PM  VAD Team --- VAD ISSUES ONLY--- Pager  [phone] (7am - 7am)  Advanced Heart Failure Team  Pager [phone] (M-F; 7a - 4p)  Please contact [facility] for night-coverage after hours (4p -7a ) and weekends on [email or web address]

## 2015-05-29 NOTE — Progress Notes (Addendum)
HeartMate 2 Rounding Note     Postop day #19 HeartMate 2 LVAD implantation   Subjective:   History of nonischemic cardiomyopathy, ejection fraction 15% Preoperative cardiogenic shock on balloon pump and dual inotropes Preoperative protein deficiency malnutrition with prealbumin 8.4 Preoperative moderate RV dysfunction Preoperative hepatic insufficiency with bilirubin of 4.2 Status post AICD implantation 2002  Postop a-fib resolved on amio, EPWs removed Postop driveline site infection- cellulitis with elevated WBC- improved, now 10.4, on iv vanc and Primaxin. We will transition to oral Keflex for discharge home and follow-up wound in clinic Postop jaundice- bilirubin  now 3.0 Postop nausea, poor appetite- better off po iron, on daily Megace Walking again with PT  -walk 300 feet today Postop RV failure better with revatio and nsr - CVP 8   and milrinone weaned off with co-ox 60%  Echo-speed study performed showing improved RV function and pump speed increased to 9400 RPM. Pericardial effusion is also better. LVAD INTERROGATION:  HeartMate II LVAD:  Flow 5.1 liters/min, speed 9400, power 5.7 PI 6.5  Controller  intact   Objective:    Vital Signs:   Temp:  [98.4 F (36.9 C)-99.8 F (37.7 C)] 98.6 F (37 C) (02/19 1114) Pulse Rate:  [99-106] 100 (02/19 1114) Resp:  [18-28] 21 (02/19 0930) SpO2:  [98 %-99 %] 99 % (02/19 0500) Weight:  [161 lb 9.6 oz (73.3 kg)] 161 lb 9.6 oz (73.3 kg) (02/19 0500) Last BM Date: 05/28/15 Mean arterial Pressure  85-95  Intake/Output:   Intake/Output Summary (Last 24 hours) at 05/29/15 1347 Last data filed at 05/29/15 1000  Gross per 24 hour  Intake    430 ml  Output   1250 ml  Net   -820 ml     Physical Exam: General:  Well appearing. No resp difficulty, mildly jaundiced skin HEENT: normal Neck: supple. JVP . Carotids 2+ bilat; no bruits. No lymphadenopathy or thryomegaly appreciated. Cor: Mechanical heart sounds with LVAD hum  present. Lungs: clear Abdomen: soft, nontender, nondistended. No hepatosplenomegaly. No bruits or masses. Good bowel sounds. Extremities: no cyanosis, clubbing, rash, edema Neuro: alert & orientedx3, cranial nerves grossly intact. moves all 4 extremities w/o difficulty. Affect pleasant  Telemetry: sinus tach  Labs: Basic Metabolic Panel:  Recent Labs Lab 05/25/15 0440 05/26/15 0535 05/27/15 0611 05/28/15 0420 05/29/15 0415  NA 128* 129* 129* 129* 129*  K 3.8 3.7 3.5 3.6 3.7  CL 96* 95* 96* 95* 97*  CO2 24 24 24 25 24   GLUCOSE 96 89 97 96 94  BUN 6 6 8 8 7   CREATININE 0.54* 0.62 0.63 0.68 0.58*  CALCIUM 8.1* 8.2* 8.1* 8.1* 8.0*    Liver Function Tests:  Recent Labs Lab 05/25/15 0440 05/26/15 0535 05/27/15 0611 05/28/15 0420 05/29/15 0415  AST 60* 62* 64* 84* 87*  ALT 32 32 31 36 38  ALKPHOS 122 116 128* 169* 157*  BILITOT 3.8* 3.8* 3.7* 3.6* 4.2*  PROT 5.7* 5.7* 6.0* 5.8* 5.5*  ALBUMIN 2.4* 2.4* 2.5* 2.4* 2.4*   No results for input(s): LIPASE, AMYLASE in the last 168 hours. No results for input(s): AMMONIA in the last 168 hours.  CBC:  Recent Labs Lab 05/25/15 0440 05/26/15 0535 05/27/15 0611 05/28/15 0420 05/29/15 0415  WBC 13.5* 12.5* 12.2* 12.1* 10.7*  HGB 9.7* 9.7* 10.3* 10.0* 9.4*  HCT 27.9* 29.2* 30.7* 30.2* 28.6*  MCV 97.6 96.4 96.2 96.8 97.3  PLT 349 379 376 309 267    INR:  Recent Labs Lab 05/25/15 0440  05/26/15 0535 05/27/15 0435 05/28/15 0420 05/29/15 0415  INR 2.65* 2.56* 2.94* 3.00* 2.62*    Other results:  EKG:   Imaging: Dg Chest 2 View  05/28/2015  CLINICAL DATA:  Shortness of breath. EXAM: CHEST  2 VIEW COMPARISON:  05/27/2015. FINDINGS: Stable left ventricular assist device. Mildly improved enlargement of the cardiac silhouette. Stable bibasilar opacity. Mildly increased small bilateral pleural effusions. Stable linear density in the left mid lung zone and right lower lung zone. Stable right subclavian AICD lead and  left PICC. Lower thoracic spine degenerative changes. IMPRESSION: 1. Stable bibasilar atelectasis and possible pneumonia. 2. Mildly improved cardiomegaly. 3. Mild increase in size of small bilateral pleural effusions. 4. Stable bilateral linear atelectasis or scarring. Electronically Signed   By: Claudie Revering M.D.   On: 05/28/2015 10:18     Medications:     Scheduled Medications: . ALPRAZolam  0.25 mg Oral QHS  . amiodarone  200 mg Oral BID  . antiseptic oral rinse  7 mL Mouth Rinse BID  . aspirin EC  81 mg Oral Daily  . benzonatate  100 mg Oral BID  . bisacodyl  10 mg Oral Daily   Or  . bisacodyl  10 mg Rectal Daily  . cephALEXin  500 mg Oral 3 times per day  . digoxin  0.125 mg Oral Daily  . docusate sodium  200 mg Oral Daily  . feeding supplement  1 Container Oral TID BM  . fluticasone  1 spray Each Nare Daily  . [START ON 05/30/2015] furosemide  40 mg Oral QODAY  . guaiFENesin  600 mg Oral BID  . guaiFENesin-dextromethorphan  5 mL Oral QHS  . loratadine  10 mg Oral Daily  . losartan  12.5 mg Oral Daily  . megestrol  40 mg Oral Daily  . montelukast  10 mg Oral QHS  . pantoprazole  40 mg Oral Daily  . sildenafil  40 mg Oral TID  . sodium chloride flush  10 mL Intravenous Q12H  . sodium chloride flush  3 mL Intravenous Q12H  . spironolactone  25 mg Oral Daily  . warfarin  1 mg Oral ONCE-1800  . Warfarin - Pharmacist Dosing Inpatient   Does not apply q1800    Infusions: . sodium chloride Stopped (05/27/15 1000)  . sodium chloride Stopped (05/29/15 1000)    PRN Medications: hydrALAZINE, HYDROcodone-acetaminophen, ondansetron (ZOFRAN) IV, sodium chloride flush, sodium chloride flush, sodium chloride flush, sodium chloride flush   Assessment:  IR tunneled PICC line placed and triple-lumen central line removed Elevated white count normalized  on antibiotics treating infection-cellulitis around driveline exit site. We'llstart by mouth Keflex. Continue at home for least 10  days or when he returns to the clinic   Postoperative jaundice slowly improving bilirubin <4  Main surgical incisions appear to be healing. We'll remove chest tube sutures prior to discharge   Plan/Discussion:   Continue diuresis with lasix 40 mg by mouth every OD Stop IV antibiotics and start Keflex Remove chest tube sutures    Patient's condition is currently stable and hospital benefit almost optimized. Should be ready to go home Monday. We'll transition from IV to po antibiotics since driveline exit site cellulitis is significantly better.  I would leave PICC line in place until we are sure he will no longer need IV antibiotics for the driveline infection. Home health nurse to care for PICC line. The PICC line can be removed at the first clinic visit if the driveline site shows progress.  I reviewed the LVAD parameters from today, and compared the results to the patient's prior recorded data.  No programming changes were made.  The LVAD is functioning within specified parameters.  The patient performs LVAD self-test daily.  LVAD interrogation was negative for any significant power changes, alarms or PI events/speed drops.  LVAD equipment check completed and is in good working order.  Back-up equipment present.   LVAD education done on emergency procedures and precautions and reviewed exit site care.  Length of Stay: Dixie III 05/29/2015, 1:47 PM

## 2015-05-30 ENCOUNTER — Telehealth (HOSPITAL_COMMUNITY): Payer: Self-pay | Admitting: Pharmacist

## 2015-05-30 ENCOUNTER — Encounter: Payer: Self-pay | Admitting: Licensed Clinical Social Worker

## 2015-05-30 LAB — BASIC METABOLIC PANEL
Anion gap: 11 (ref 5–15)
BUN: 8 mg/dL (ref 6–20)
CO2: 21 mmol/L — ABNORMAL LOW (ref 22–32)
Calcium: 8 mg/dL — ABNORMAL LOW (ref 8.9–10.3)
Chloride: 99 mmol/L — ABNORMAL LOW (ref 101–111)
Creatinine, Ser: 0.58 mg/dL — ABNORMAL LOW (ref 0.61–1.24)
GFR calc Af Amer: >60 mL/min (ref >60)
GFR calc non Af Amer: >60 mL/min (ref >60)
Glucose, Bld: 73 mg/dL (ref 65–99)
Potassium: 3.5 mmol/L (ref 3.5–5.1)
Sodium: 131 mmol/L — ABNORMAL LOW (ref 135–145)

## 2015-05-30 LAB — CBC
HCT: 27.8 % — ABNORMAL LOW (ref 39.0–52.0)
Hemoglobin: 9.3 g/dL — ABNORMAL LOW (ref 13.0–17.0)
MCH: 32.2 pg (ref 26.0–34.0)
MCHC: 33.5 g/dL (ref 30.0–36.0)
MCV: 96.2 fL (ref 78.0–100.0)
Platelets: 235 10*3/uL (ref 150–400)
RBC: 2.89 MIL/uL — ABNORMAL LOW (ref 4.22–5.81)
RDW: 16.3 % — ABNORMAL HIGH (ref 11.5–15.5)
WBC: 10.1 10*3/uL (ref 4.0–10.5)

## 2015-05-30 LAB — PROTIME-INR
INR: 2.79 — ABNORMAL HIGH (ref 0.00–1.49)
Prothrombin Time: 29 seconds — ABNORMAL HIGH (ref 11.6–15.2)

## 2015-05-30 LAB — CARBOXYHEMOGLOBIN
Carboxyhemoglobin: 2.7 % — ABNORMAL HIGH (ref 0.5–1.5)
Methemoglobin: 0.9 % (ref 0.0–1.5)
O2 Saturation: 73 %
Total hemoglobin: 9.2 g/dL — ABNORMAL LOW (ref 13.5–18.0)

## 2015-05-30 LAB — LACTATE DEHYDROGENASE: LDH: 254 U/L — ABNORMAL HIGH (ref 98–192)

## 2015-05-30 MED ORDER — DOCUSATE SODIUM 100 MG PO CAPS: 200.0000 mg | ORAL_CAPSULE | Freq: Every day | ORAL | Status: DC | PRN

## 2015-05-30 MED ORDER — SPIRONOLACTONE 25 MG PO TABS: 25.0000 mg | ORAL_TABLET | Freq: Every day | ORAL | Status: DC

## 2015-05-30 MED ORDER — SILDENAFIL CITRATE 20 MG PO TABS: 40.0000 mg | ORAL_TABLET | Freq: Three times a day (TID) | ORAL | Status: DC

## 2015-05-30 MED ORDER — LOSARTAN POTASSIUM 25 MG PO TABS
12.5000 mg | ORAL_TABLET | Freq: Every day | ORAL | Status: DC
Start: 2015-05-30 — End: 2015-06-08

## 2015-05-30 MED ORDER — WARFARIN SODIUM 1 MG PO TABS: 1.0000 mg | ORAL_TABLET | Freq: Every day | ORAL | Status: DC

## 2015-05-30 MED ORDER — FUROSEMIDE 20 MG PO TABS: 40.0000 mg | ORAL_TABLET | ORAL | Status: DC

## 2015-05-30 MED ORDER — AMIODARONE HCL 200 MG PO TABS: 200.0000 mg | ORAL_TABLET | Freq: Two times a day (BID) | ORAL | Status: DC

## 2015-05-30 MED ORDER — CEPHALEXIN 500 MG PO CAPS: 500.0000 mg | ORAL_CAPSULE | Freq: Three times a day (TID) | ORAL | Status: DC

## 2015-05-30 MED ORDER — HYDROCODONE-ACETAMINOPHEN 5-325 MG PO TABS: 1.0000 | ORAL_TABLET | ORAL | Status: DC | PRN

## 2015-05-30 MED FILL — AMIODARONE HCL 200 MG TAB: 200 | 23 days supply | Qty: 45 | Fill #0

## 2015-05-30 MED FILL — SPIRONOLACTONE 25 MG TABLET: 25 | 30 days supply | Qty: 30 | Fill #0

## 2015-05-30 MED FILL — LOSARTAN POTASSIUM 25 MG TA: 25 | 60 days supply | Qty: 30 | Fill #0

## 2015-05-30 MED FILL — CEPHALEXIN 500 MG CAPSULE: 500 | 9 days supply | Qty: 26 | Fill #0

## 2015-05-30 MED FILL — SILDENAFIL 20 MG TABLET: 20 | 15 days supply | Qty: 90 | Fill #0

## 2015-05-30 MED FILL — WARFARIN SODIUM 1 MG TABLET: 1 | 45 days supply | Qty: 45 | Fill #0

## 2015-05-30 MED FILL — FUROSEMIDE 20 MG TABLET: 20 | 30 days supply | Qty: 40 | Fill #0

## 2015-05-30 NOTE — Progress Notes (Signed)
Discharge equipment delivered to room and includes:  ____ Two system controllers with preset: Fixed speed of  9400 RPM Low speed limit  8800 RPM ____ One power module (PM) with 20' patient cable ____ One universal Charity fundraiser (UBC) ____ Eight fully charged batteries ____ Four battery clips ____ One travel case ____ One holster vest ____    One Merchant navy officer ____    One Programmer, multimedia ____    One Mining engineer ____    30 daily dressing kits ____    5 attachment centurion foley attachment devices  Following notification process completed with:      [place], PCP    Zada Girt, RN VAD Coordinator [phone] 24/7 VAD Pager: [phone]

## 2015-05-30 NOTE — Progress Notes (Signed)
VAD TEAM ROUNDING NOTE  Patient ID: Patrick Humphrey, male   DOB: May 18, 1965, 50 y.o.   MRN: [id number] HeartMate 2 Rounding Note  Subjective:    Yesterday started on lasix every other day. Co-ox 73%. Weight down to 169.  Denies SOB. Wants to go home.    LVAD INTERROGATION:  HeartMate II LVAD:  Flow 4.9 liters/min, speed 9400,  power 5.7, PI 7. 8-10 PI events.   Objective:    Vital Signs:   Temp:  [97.7 F (36.5 C)-98.6 F (37 C)] 98.1 F (36.7 C) (02/20 0732) Pulse Rate:  [80-106] 85 (02/19 2328) Resp:  [18-21] 20 (02/20 0732) BP: (95-104)/(80-88) 104/88 mmHg (02/20 0732) SpO2:  [96 %-98 %] 98 % (02/20 0732) Weight:  [160 lb 4.4 oz (72.7 kg)] 160 lb 4.4 oz (72.7 kg) (02/20 0400) Last BM Date: 05/30/15 Mean arterial Pressure 80s    Intake/Output:   Intake/Output Summary (Last 24 hours) at 05/30/15 0758 Last data filed at 05/30/15 0600  Gross per 24 hour  Intake    430 ml  Output   1400 ml  Net   -970 ml     Physical Exam: General:  NAD. No resp difficulty. In the  bed   HEENT: normal Neck: supple. JVP 7-8  Cor: Mechanical heart sounds with LVAD hum present. Lungs: Clear.  Abdomen: soft, NT, ND, no HSM. No bruits or masses. +BS  Driveline: C/D/I; securement device intact and driveline incorporated Extremities: no cyanosis, clubbing, rash, R and LLE ted hose. R and L ankle trace edema. LUE PICC.  Neuro: alert & orientedx3, cranial nerves grossly intact. moves all 4 extremities w/o difficulty. Affect pleasant  Telemetry: SR 90-100s     Labs: Basic Metabolic Panel:  Recent Labs Lab 05/25/15 0440 05/26/15 0535 05/27/15 0611 05/28/15 0420 05/29/15 0415  NA 128* 129* 129* 129* 129*  K 3.8 3.7 3.5 3.6 3.7  CL 96* 95* 96* 95* 97*  CO2 24 24 24 25 24   GLUCOSE 96 89 97 96 94  BUN 6 6 8 8 7   CREATININE 0.54* 0.62 0.63 0.68 0.58*  CALCIUM 8.1* 8.2* 8.1* 8.1* 8.0*    Liver Function Tests:  Recent Labs Lab 05/25/15 0440 05/26/15 0535 05/27/15 0611  05/28/15 0420 05/29/15 0415  AST 60* 62* 64* 84* 87*  ALT 32 32 31 36 38  ALKPHOS 122 116 128* 169* 157*  BILITOT 3.8* 3.8* 3.7* 3.6* 4.2*  PROT 5.7* 5.7* 6.0* 5.8* 5.5*  ALBUMIN 2.4* 2.4* 2.5* 2.4* 2.4*   No results for input(s): LIPASE, AMYLASE in the last 168 hours. No results for input(s): AMMONIA in the last 168 hours.  CBC:  Recent Labs Lab 05/26/15 0535 05/27/15 0611 05/28/15 0420 05/29/15 0415 05/30/15 0229  WBC 12.5* 12.2* 12.1* 10.7* 10.1  HGB 9.7* 10.3* 10.0* 9.4* 9.3*  HCT 29.2* 30.7* 30.2* 28.6* 27.8*  MCV 96.4 96.2 96.8 97.3 96.2  PLT 379 376 309 267 235    INR:  Recent Labs Lab 05/26/15 0535 05/27/15 0435 05/28/15 0420 05/29/15 0415 05/30/15 0229  INR 2.56* 2.94* 3.00* 2.62* 2.79*    Other results:    Imaging: Dg Chest 2 View  05/28/2015  CLINICAL DATA:  Shortness of breath. EXAM: CHEST  2 VIEW COMPARISON:  05/27/2015. FINDINGS: Stable left ventricular assist device. Mildly improved enlargement of the cardiac silhouette. Stable bibasilar opacity. Mildly increased small bilateral pleural effusions. Stable linear density in the left mid lung zone and right lower lung zone. Stable right subclavian AICD lead  and left PICC. Lower thoracic spine degenerative changes. IMPRESSION: 1. Stable bibasilar atelectasis and possible pneumonia. 2. Mildly improved cardiomegaly. 3. Mild increase in size of small bilateral pleural effusions. 4. Stable bilateral linear atelectasis or scarring. Electronically Signed   By: Claudie Revering M.D.   On: 05/28/2015 10:18     Medications:     Scheduled Medications: . ALPRAZolam  0.25 mg Oral QHS  . amiodarone  200 mg Oral BID  . antiseptic oral rinse  7 mL Mouth Rinse BID  . aspirin EC  81 mg Oral Daily  . benzonatate  100 mg Oral BID  . bisacodyl  10 mg Oral Daily   Or  . bisacodyl  10 mg Rectal Daily  . cephALEXin  500 mg Oral 3 times per day  . digoxin  0.125 mg Oral Daily  . docusate sodium  200 mg Oral Daily  .  feeding supplement  1 Container Oral TID BM  . fluticasone  1 spray Each Nare Daily  . furosemide  40 mg Oral QODAY  . guaiFENesin  600 mg Oral BID  . guaiFENesin-dextromethorphan  5 mL Oral QHS  . loratadine  10 mg Oral Daily  . losartan  12.5 mg Oral Daily  . megestrol  40 mg Oral Daily  . montelukast  10 mg Oral QHS  . pantoprazole  40 mg Oral Daily  . sildenafil  40 mg Oral TID  . sodium chloride flush  10 mL Intravenous Q12H  . sodium chloride flush  3 mL Intravenous Q12H  . spironolactone  25 mg Oral Daily  . Warfarin - Pharmacist Dosing Inpatient   Does not apply q1800    Infusions: . sodium chloride Stopped (05/27/15 1000)  . sodium chloride Stopped (05/29/15 1000)    PRN Medications: acetaminophen, hydrALAZINE, HYDROcodone-acetaminophen, ondansetron (ZOFRAN) IV, sodium chloride flush, sodium chloride flush, sodium chloride flush, sodium chloride flush   Assessment:   1. Nonischemic cardiomyopathy: Long-standing, EF 20-25%.  Coronary angiography this admission with no significant CAD. Cardiogenic shock requiring milrinone and norepinephrine, IABP placed. Heartmate II LVAD placed 05/10/15.  2. Post-op blood loss anemia: 2 units PRBCs 1/31.  3. Elevate bilirubin: Noted pre-op, thought due to RV failure.  Higher post-op with jaundice.  Elevated direct bilirubin, so liver source rather than hemolysis.  4. Atrial flutter: Resolved on amiodarone.  5. Hyponatremia 6. Atrial fibrillation with RVR 2/7.  TEE-guided DCCV 05/18/15.  7. RV failure: Revatio begun 2/7.  8. Small left apical PTX.  9. Elevated WBCs: On imipenem  Plan/Discussion:    LDH coming back down. INR 2.79. Goal 2-3. On aspirin 81 mg daily.  Continue Keflex total of 10 days total. Last day 06/07/15.    Home today with HH.   Lasix 40 mg every other day Spiro 25 mg daily  Losartan 12.5 mg daily Dig 0.125 mg daily Revatio 40 mg tid Amio 200 mg twice a day Protonix 40 mg daily Coumadin Start 1mg  daily  tomorrow.  Aspirin 81 mg daily   Follow up in VAD clinic next week.   I reviewed the LVAD parameters from today, and compared the results to the patient's prior recorded data.  No programming changes were made.  The LVAD is functioning within specified parameters.  The patient performs LVAD self-test daily.  LVAD interrogation was negative for any significant power changes, alarms or PI events/speed drops.  LVAD equipment check completed and is in good working order.  Back-up equipment present.   LVAD education done on  emergency procedures and precautions and reviewed exit site care.  Length of Stay: [place]  NP-C  05/30/2015, 7:58 AM  VAD Team --- VAD ISSUES ONLY--- Pager [phone] (7am - 7am)  Advanced Heart Failure Team  Pager [phone] (M-F; 7a - 4p)  Please contact [facility] for night-coverage after hours (4p -7a ) and weekends on [email or web address]  Patient seen with NP, agree with the above note.  He is doing well today, walked quite a bit yesterday.  No dyspnea. Labs stable.  INR therapeutic.    He is ready to go home, will go on the above meds.  Continue qod Lasix.   Continue cephalexin until 2/27.   [name] 05/30/2015 8:54 AM

## 2015-05-30 NOTE — Progress Notes (Signed)
Physical Therapy Treatment Patient Details Name: Patrick Humphrey MRN: [id number] DOB: July 01, 1965 Today's Date: 05/30/2015    History of Present Illness patient is a 50 yo male with nonischemic cardiomyopathy since 2002 with acute on chronic systolic heart failure and cardiogenic shock, deteriorating on 2 inotropes and had IABP placed. Moderate RV systolic dysfunction on preop echo.  s/p HeartMate II LVAD    PT Comments    Patient progressing very well towards PT goals. Mobilizing comfortably with LVAD. Patient with good recall on education points throughout session and demonstrates independence with power conversion to/from batteries. Anticipate patient will be safe for d/c home. Educated on safety with mobility and expectations for discharge.   Follow Up Recommendations  Home health PT     Equipment Recommendations  Other (comment) Agricultural consultant)    Recommendations for Other Services       Precautions / Restrictions Precautions Precautions: Sternal;Fall Precaution Comments: sternal,LVAD precautions- black bag at all times Restrictions Weight Bearing Restrictions: Yes Other Position/Activity Restrictions: sternal precautions apply    Mobility  Bed Mobility               General bed mobility comments: sitting EOB on arrival  Transfers Overall transfer level: Needs assistance Equipment used: None Transfers: Sit to/from Stand Sit to Stand: Supervision         General transfer comment: able to power up to standing using BLEs no physical assist required  Ambulation/Gait Ambulation/Gait assistance: Supervision Ambulation Distance (Feet): 320 Feet (60 ft without device) Assistive device: Rolling walker (2 wheeled);None Gait Pattern/deviations: Step-through pattern;Decreased stride length;Trunk flexed Gait velocity: decreased   General Gait Details: some modest instability and decreased gait with use of RW, improved cadence noted without device.    Stairs            Wheelchair Mobility    Modified Rankin (Stroke Patients Only)       Balance   Sitting-balance support: No upper extremity supported Sitting balance-Leahy Scale: Good       Standing balance-Leahy Scale: Good Standing balance comment: able to stand without UE support this session                    Cognition Arousal/Alertness: Awake/alert Behavior During Therapy: WFL for tasks assessed/performed;Anxious Overall Cognitive Status: Within Functional Limits for tasks assessed                      Exercises      General Comments General comments (skin integrity, edema, etc.): wife present during session, provided very minimal cues at times, but otherwise patient supervision to indpendent with conversion of power source, management of LVAD equipment and black bag needs. Patient able to recall education points throughout session without difficulty or prompting.       Pertinent Vitals/Pain Pain Assessment: No/denies pain    Home Living                      Prior Function            PT Goals (current goals can now be found in the care plan section) Acute Rehab PT Goals Patient Stated Goal: To go home PT Goal Formulation: With patient Time For Goal Achievement: 05/26/15 Potential to Achieve Goals: Good Progress towards PT goals: Progressing toward goals    Frequency  Min 4X/week    PT Plan Current plan remains appropriate    Co-evaluation PT/OT/SLP Co-Evaluation/Treatment: Yes Reason for Co-Treatment: Complexity of the patient's  impairments (multi-system involvement) PT goals addressed during session: Mobility/safety with mobility OT goals addressed during session: ADL's and self-care     End of Session Equipment Utilized During Treatment:  (LVAD ) Activity Tolerance: Patient tolerated treatment well Patient left: in bed;with call bell/phone within reach;with family/visitor present (sitting EOB)     Time: [id number] PT Time  Calculation (min) (ACUTE ONLY): 29 min  Charges:  $Gait Training: 8-22 mins                    G CodesDuncan Dull 23-Jun-2015, 11:51 AM Alben Deeds, PT DPT  [phone]

## 2015-05-30 NOTE — Progress Notes (Signed)
1040-1100 Discussed with pt and wife sternal precautions, IS and walking instructions. Gave CHF booklet even though wife thought they had one at home. Did teachback re weight gain, sodium restrictions. Wife stated they had been trying to keep at 1500mg . Discussed CRP 2 and will refer to [place].  Wife stated that PT said pt could get rollator. Notified pt's RN since pt will need this arranged. Graylon Good RN BSN 05/30/2015 10:56 AM

## 2015-05-30 NOTE — Progress Notes (Signed)
Occupational Therapy Treatment and Discharge Patient Details Name: Patrick Humphrey MRN: [id number] DOB: December 13, 1965 Today's Date: 05/30/2015    History of present illness patient is a 50 yo male with nonischemic cardiomyopathy since 2002 with acute on chronic systolic heart failure and cardiogenic shock, deteriorating on 2 inotropes and had IABP placed. Moderate RV systolic dysfunction on preop echo.  s/p HeartMate II LVAD   OT comments  Pt is performing self care, mobility and power source change with supervision to min assist. He has been educated at length in energy conservation strategies and sternal precautions.  Ready for planned discharge home today with assist of his very capable wife.    Follow Up Recommendations  Home health OT;Supervision/Assistance - 24 hour    Equipment Recommendations  None recommended by OT    Recommendations for Other Services      Precautions / Restrictions Precautions Precautions: Sternal;Fall Precaution Comments: sternal,LVAD precautions- black bag at all times Restrictions Weight Bearing Restrictions: No       Mobility Bed Mobility               General bed mobility comments: pt sitting at EOB  Transfers Overall transfer level: Needs assistance Equipment used: Rolling walker (2 wheeled);None   Sit to Stand: Supervision              Balance     Sitting balance-Leahy Scale: Good       Standing balance-Leahy Scale: Good                     ADL Overall ADL's : Needs assistance/impaired                 Upper Body Dressing : Minimal assistance;Sitting Upper Body Dressing Details (indicate cue type and reason): battery vest Lower Body Dressing: Supervision/safety;Sit to/from stand Lower Body Dressing Details (indicate cue type and reason): donned shoes without difficulty             Functional mobility during ADLs: Supervision/safety;+2 for safety/equipment (used RW for energy conservation ) General  ADL Comments: Pt has been completing his sponge bath and dressing per his and wife's report. Pt cued to check black bag for all necessary items. Pt able to independently change power source and place batteries in vest.        Vision                     Perception     Praxis      Cognition   Behavior During Therapy: [facility] for tasks assessed/performed;Anxious Overall Cognitive Status: Within Functional Limits for tasks assessed                       Extremity/Trunk Assessment               Exercises     Shoulder Instructions       General Comments      Pertinent Vitals/ Pain       Pain Assessment: No/denies pain  Home Living                                          Prior Functioning/Environment              Frequency Min 2X/week     Progress Toward Goals  OT Goals(current goals can now be found in  the care plan section)  Progress towards OT goals: Goals met/education completed, patient discharged from OT  Acute Rehab OT Goals Patient Stated Goal: To go home  Plan Discharge plan remains appropriate    Co-evaluation    PT/OT/SLP Co-Evaluation/Treatment: Yes Reason for Co-Treatment: Complexity of the patient's impairments (multi-system involvement)   OT goals addressed during session: ADL's and self-care      End of Session     Activity Tolerance Patient tolerated treatment well   Patient Left in bed;with call bell/phone within reach;with family/visitor present   Nurse Communication          Time: 6606-0045 OT Time Calculation (min): 29 min  Charges: OT General Charges $OT Visit: 1 Procedure OT Treatments $Self Care/Home Management : 8-22 mins  Malka So 05/30/2015, 11:50 AM  [phone]

## 2015-05-30 NOTE — Discharge Summary (Signed)
Advanced Heart Failure Team  Discharge Summary   Patient ID: Patrick Humphrey MRN: [id number], DOB/AGE: 1965/04/17 50 y.o. Admit date: 04/29/2015 D/C date:     05/30/2015   Primary Discharge Diagnoses:  1. Nonischemic cardiomyopathy: Long-standing, EF 20-25%. Coronary angiography this admission with no significant CAD. Cardiogenic shock requiring milrinone and norepinephrine, IABP placed. Heartmate II LVAD placed 05/10/15.  2. Post-op blood loss anemia: 2 units PRBCs 1/31.  3. Elevate bilirubin: Noted pre-op, thought due to RV failure. Higher post-op with jaundice. Elevated direct bilirubin, so liver source rather than hemolysis.  4. Atrial flutter: Resolved on amiodarone.  5. Hyponatremia 6. Atrial fibrillation with RVR 2/7. TEE-guided DCCV 05/18/15.  7. RV failure: Revatio begun 2/7.  8. Small left apical PTX.  9. Elevated WBCs: On imipenem    Hospital Course: Patrick Humphrey is a 50 y.o. male with a hx of systolic HF, NICM, status post AICD, NSVT admitted with increased dyspnea related to A/C systolic heart failure. Initially diuresed with IV lasix with little relief. PICC line was placed with severely reduced mixed venous saturatio, 44%, so milrinone was started. Unfortunately he declined and required dual pressors with milrinone and norep + IABP. He evaluated by VAD and deemed appropriate for LVAD under BT. Post operative course complicated by RV failure, A Fib/flutter, and elevated WBC. All drips eventually weaned off. He was also placed on broad spectrum antibiotics but later narrowed to keflex. He worked with PT/OT with recommendations for [facility].     1. Nonischemic cardiomyopathy: Long-standing, EF 20-25%. Coronary angiography this admission with no significant CAD. Cardiogenic shock requiring milrinone and norepinephrine, IABP placed on January 30th.  Heartmate II LVAD placed 05/10/15. Extubated on 2/1. Gradually all drip weaned off except for milrinone.  Milrinone was  eventually weaned off. Volume status was stable on the day discharge and he will continue lasix 40 mg and alternate with 20 mg of lasix. Maps stable on 12.5 mg losartan daily and 25 mg spiro daily. Plan to continue  LVAD speed adjusted with ramp ECHO with fixed speed 9400. He will continue aspirin 81 mg daily + coumadin.INR goal 2-2.5  2. Post-op blood loss anemia: Hgb down to 8 . Received 2 units PRBCs 1/31. Hemoglobin on the day of discharge was  9.3  3. Elevate bilirubin: Noted pre-op, thought due to RV failure. Higher post-op with jaundice. Elevated direct bilirubin, so liver source rather than hemolysis. Bilirubin peaked at 9.8 but gradually went down to to 4.2   4. Atrial flutter: Resolved on amiodarone. Continue amio 200 mg twice a day.  5. Hyponatremia: Sodium dropped to 124 but on the day discharge 131.  6. Atrial fibrillation with RVR 2/7. TEE-guided DCCV 05/18/15. Maintained NSR. Loaded on amio and  transitioned to amio 200 mg twice a day.  7. RV failure: Noted on ECHO. Revatio begun 05/17/15. Plan to continue revatio 40 mg tid.  8. Small left apical PTX.- this was followed by cardiac surgery.  9. Elevated WBCs: WBC peaked 20 and on the day of discharge WBC down to 10.1.  Placed on broad spectrum antibiotics + fluconazole.  Continue Keflex until 06/06/15.  He will continue to be followed closely in the VAD clinic. Plan to check INR on Friday. [facility] will follow for [place], [place], [place]. He and his wife received extensive LVAD education. His will perform daily dressing changes.     LVAD INTERROGATION:  HeartMate II LVAD: Flow 4.9 liters/min, speed 9400, power 5.7, PI 7. 8-10 PI events.  Discharge Weight: 160 pounds  Discharge Vitals: Blood pressure 104/88, pulse 102, temperature 98.1 F (36.7 C), temperature source Oral, resp. rate 20, height 5\' 11"  (1.803 m), weight 160 lb 4.4 oz (72.7 kg), SpO2 98 %.  Labs: Lab Results  Component Value Date   WBC 10.1 05/30/2015   HGB 9.3* 05/30/2015    HCT 27.8* 05/30/2015   MCV 96.2 05/30/2015   PLT 235 05/30/2015     Recent Labs Lab 05/29/15 0415 05/30/15 0810  NA 129* 131*  K 3.7 3.5  CL 97* 99*  CO2 24 21*  BUN 7 8  CREATININE 0.58* 0.58*  CALCIUM 8.0* 8.0*  PROT 5.5*  --   BILITOT 4.2*  --   ALKPHOS 157*  --   ALT 38  --   AST 87*  --   GLUCOSE 94 73   Lab Results  Component Value Date   CHOL 87 05/04/2015   HDL 26* 05/04/2015   LDLCALC 53 05/04/2015   TRIG 41 05/04/2015   BNP (last 3 results)  Recent Labs  04/29/15 1650 05/11/15 0320  BNP >4500.0* 1201.7*    ProBNP (last 3 results) No results for input(s): PROBNP in the last 8760 hours.   Diagnostic Studies/Procedures   No results found.  Discharge Medications     Medication List    STOP taking these medications        carvedilol 12.5 MG tablet  Commonly known as:  COREG     fish oil-omega-3 fatty acids 1000 MG capsule      TAKE these medications        amiodarone 200 MG tablet  Commonly known as:  PACERONE  Take 1 tablet (200 mg total) by mouth 2 (two) times daily.     aspirin EC 81 MG tablet  Take 81 mg by mouth daily.     cephALEXin 500 MG capsule  Commonly known as:  KEFLEX  Take 1 capsule (500 mg total) by mouth every 8 (eight) hours.     cetirizine 10 MG tablet  Commonly known as:  ZYRTEC  Take 10 mg by mouth daily.     CoQ-10 200 MG Caps  Take 1 capsule by mouth daily.     digoxin 0.125 MG tablet  Commonly known as:  LANOXIN  Take 0.125 mg by mouth daily.     docusate sodium 100 MG capsule  Commonly known as:  COLACE  Take 2 capsules (200 mg total) by mouth daily as needed for mild constipation.     esomeprazole 40 MG capsule  Commonly known as:  NEXIUM  Take 40 mg by mouth at bedtime.     furosemide 20 MG tablet  Commonly known as:  LASIX  Take 2 tablets (40 mg total) by mouth every other day. My take 40 mg by mouth daily as needed for swelling.     HYDROcodone-acetaminophen 5-325 MG tablet  Commonly  known as:  NORCO/VICODIN  Take 1-2 tablets by mouth every 4 (four) hours as needed for moderate pain or severe pain.     LORazepam 0.5 MG tablet  Commonly known as:  ATIVAN  Take 0.5 mg by mouth at bedtime.     losartan 25 MG tablet  Commonly known as:  COZAAR  Take 0.5 tablets (12.5 mg total) by mouth daily.     montelukast 10 MG tablet  Commonly known as:  SINGULAIR  Take 10 mg by mouth at bedtime.     sildenafil 20 MG tablet  Commonly known  as:  REVATIO  Take 2 tablets (40 mg total) by mouth 3 (three) times daily.     spironolactone 25 MG tablet  Commonly known as:  ALDACTONE  Take 1 tablet (25 mg total) by mouth daily.     warfarin 1 MG tablet  Commonly known as:  COUMADIN  Take 1 tablet (1 mg total) by mouth daily.  Start taking on:  05/31/2015        Disposition   The patient will be discharged in stable condition to home. Discharge Instructions    Amb Referral to Cardiac Rehabilitation    Complete by:  As directed   Diagnosis:   AMI Comment - LVAD Heart Failure (see criteria below)       Diet - low sodium heart healthy    Complete by:  As directed      Heart Failure patients record your daily weight using the same scale at the same time of day    Complete by:  As directed      Increase activity slowly    Complete by:  As directed               Duration of Discharge Encounter: Greater than 35 minutes   Signed, Amy Clegg NP-C  05/30/2015, 11:31 AM

## 2015-05-30 NOTE — Care Management Important Message (Signed)
Important Message  Patient Details  Name: Patrick Humphrey MRN: [id number] Date of Birth: 1966-02-06   Medicare Important Message Given:  Yes    [name] 05/30/2015, 10:53 AM

## 2015-05-30 NOTE — Telephone Encounter (Signed)
[place] who confirmed that sildenafil 40 mg TID did go through insurance for $49.46/mo. Patient may have deductible to meet which is why cost is so high this month.   Ruta Hinds. Velva Harman, PharmD, BCPS, CPP Clinical Pharmacist Pager: [phone] Phone: [phone] 05/30/2015 3:18 PM

## 2015-05-30 NOTE — Care Management Note (Signed)
Case Management Note  Patient Details  Name: Patrick Humphrey MRN: [id number] Date of Birth: 1965/05/09  Subjective/Objective:       Adm w heart failure, has new vad             Action/Plan:lives w wife  Expected Discharge Date:                  Expected Discharge Plan:  [place]  In-House Referral:     Discharge planning Services  CM Consult  Post Acute Care Choice:  Home Health Choice offered to:  Patient  DME Arranged:    DME Agency:     HH Arranged:  RN, Disease Management, PT, OT [place] Agency:  [place]  Status of Service:     Medicare Important Message Given:  Yes Date Medicare IM Given:    Medicare IM give by:    Date Additional Medicare IM Given:    Additional Medicare Important Message give by:     If discussed at [place] of Stay Meetings, dates discussed:    Additional Comments: have alerted tiffany w adv homecare of disch planned for today.  Lacretia Leigh, RN 05/30/2015, 10:23 AM

## 2015-05-31 ENCOUNTER — Telehealth (HOSPITAL_COMMUNITY): Payer: Self-pay | Admitting: Infectious Diseases

## 2015-05-31 NOTE — Progress Notes (Signed)
CSW met with patient at bedside prior to discharge home today. Patient states he is ready and waiting on his wife to arrive. Patient states he is ready to go home and feels confident with the LVAD. Patient appears ready to begin life living with an LVAD in the community. CSW discussed and encouraged attendance at the LVAD support group and will follow up with patient on the outpatient clinic. CSW will continue to follow for support as needed. Raquel Sarna, [place]

## 2015-05-31 NOTE — Telephone Encounter (Signed)
Called to check in with the Patrick Humphrey since hospital D/C. Patrick Humphrey reports he has rested well and his appetite is picking up.   Appointment made for Monday 06/06/15 @ 11:30 for VAD clinic FU. Asked to bring medication with them to appointment and Log Book with parameters.

## 2015-06-01 ENCOUNTER — Telehealth: Payer: Self-pay | Admitting: Cardiovascular Disease

## 2015-06-01 DIAGNOSIS — I255 Ischemic cardiomyopathy: Secondary | ICD-10-CM | POA: Diagnosis not present

## 2015-06-01 DIAGNOSIS — I5021 Acute systolic (congestive) heart failure: Secondary | ICD-10-CM | POA: Diagnosis not present

## 2015-06-01 DIAGNOSIS — Z48812 Encounter for surgical aftercare following surgery on the circulatory system: Secondary | ICD-10-CM | POA: Diagnosis not present

## 2015-06-01 DIAGNOSIS — I2510 Atherosclerotic heart disease of native coronary artery without angina pectoris: Secondary | ICD-10-CM | POA: Diagnosis not present

## 2015-06-01 NOTE — Telephone Encounter (Signed)
Received a call from [place] case manager with [facility] calling to let Dr.Berry know she we be enrolling patient in heart failure clinic if patient agrees.She wanted to know patient's last EF and vital signs.She will contact patient.

## 2015-06-02 ENCOUNTER — Telehealth (HOSPITAL_COMMUNITY): Payer: Self-pay

## 2015-06-02 ENCOUNTER — Telehealth (HOSPITAL_COMMUNITY): Payer: Self-pay | Admitting: Infectious Diseases

## 2015-06-02 ENCOUNTER — Ambulatory Visit (INDEPENDENT_AMBULATORY_CARE_PROVIDER_SITE_OTHER): Payer: Medicare Other | Admitting: Pharmacist Clinician (PhC)/ Clinical Pharmacy Specialist

## 2015-06-02 DIAGNOSIS — Z95811 Presence of heart assist device: Secondary | ICD-10-CM

## 2015-06-02 LAB — POCT INR: INR: 2.4

## 2015-06-02 NOTE — Telephone Encounter (Signed)
Call received from Mr. Lengle re: equipment beeping. Reports that it has happened two times today while on battery--reports that by the time he gets to looking at the controller screen the alarm clears. Reviewed recent alarms and reading back "connect power" only which we would expect.   They way he describes the intermittent beeping it sounds like there is a connection to one of the leads that is not maintaining. I asked for him to check to ensure good connection points with the leads to the clips and make sure batteries are seated appropriately.   I also asked to further troubleshoot to change clips and batteries to ensure there is not something like a crack in the current clip that would prompt intermittent connection issues. May need to clean terminals on batteries/clips also. Requested to place himself on PM tonight earlier than normal to assess if alarms occurs on that power source as well.   If persists will need to assess for possible lead damage in controller itself. I invited him to come to clinic tomorrow for a nurse visit to troubleshoot if he should not get success with the above instructions. He verbalized he would call back tomorrow in the event the alarms persisted or page me tonight if they escalated in priority.   Janene Madeira, RN VAD Coordinator   Office: [phone] 24/7 VAD Pager: [phone]

## 2015-06-02 NOTE — Telephone Encounter (Signed)
Debbie PT with [facility] called CHF clinic traige line to request VO for BIW visits x 3 weeks. VO given per Dr. Haroldine Laws.  Renee Pain

## 2015-06-03 ENCOUNTER — Telehealth (HOSPITAL_COMMUNITY): Payer: Self-pay | Admitting: Infectious Diseases

## 2015-06-03 ENCOUNTER — Ambulatory Visit (HOSPITAL_COMMUNITY): Payer: Self-pay | Admitting: Infectious Diseases

## 2015-06-03 NOTE — Telephone Encounter (Signed)
Called to FU with him re: issues surrounding beeping on controller. Resolved. Will Reassess Monday in clinic.

## 2015-06-06 ENCOUNTER — Other Ambulatory Visit (HOSPITAL_COMMUNITY): Payer: Self-pay | Admitting: Pharmacist

## 2015-06-06 ENCOUNTER — Ambulatory Visit (HOSPITAL_COMMUNITY)
Admission: RE | Admit: 2015-06-06 | Discharge: 2015-06-06 | Disposition: A | Discharge status: Home / Self Care | Payer: Medicare Other | Source: Ambulatory Visit | Attending: Cardiology | Admitting: Cardiology

## 2015-06-06 ENCOUNTER — Other Ambulatory Visit (HOSPITAL_COMMUNITY): Payer: Self-pay | Admitting: Infectious Diseases

## 2015-06-06 ENCOUNTER — Ambulatory Visit (HOSPITAL_COMMUNITY): Payer: Self-pay | Admitting: Infectious Diseases

## 2015-06-06 DIAGNOSIS — I5023 Acute on chronic systolic (congestive) heart failure: Secondary | ICD-10-CM | POA: Diagnosis not present

## 2015-06-06 DIAGNOSIS — I4892 Unspecified atrial flutter: Secondary | ICD-10-CM | POA: Insufficient documentation

## 2015-06-06 DIAGNOSIS — I4891 Unspecified atrial fibrillation: Secondary | ICD-10-CM | POA: Diagnosis not present

## 2015-06-06 DIAGNOSIS — R0609 Other forms of dyspnea: Secondary | ICD-10-CM

## 2015-06-06 DIAGNOSIS — Z789 Other specified health status: Secondary | ICD-10-CM

## 2015-06-06 DIAGNOSIS — Z95811 Presence of heart assist device: Secondary | ICD-10-CM

## 2015-06-06 DIAGNOSIS — I5022 Chronic systolic (congestive) heart failure: Secondary | ICD-10-CM | POA: Insufficient documentation

## 2015-06-06 DIAGNOSIS — Z87891 Personal history of nicotine dependence: Secondary | ICD-10-CM | POA: Insufficient documentation

## 2015-06-06 DIAGNOSIS — R0600 Dyspnea, unspecified: Secondary | ICD-10-CM

## 2015-06-06 DIAGNOSIS — Z7901 Long term (current) use of anticoagulants: Secondary | ICD-10-CM | POA: Insufficient documentation

## 2015-06-06 DIAGNOSIS — Z7982 Long term (current) use of aspirin: Secondary | ICD-10-CM | POA: Diagnosis not present

## 2015-06-06 DIAGNOSIS — Z79899 Other long term (current) drug therapy: Secondary | ICD-10-CM | POA: Diagnosis not present

## 2015-06-06 DIAGNOSIS — Z8249 Family history of ischemic heart disease and other diseases of the circulatory system: Secondary | ICD-10-CM | POA: Diagnosis not present

## 2015-06-06 DIAGNOSIS — I428 Other cardiomyopathies: Secondary | ICD-10-CM | POA: Insufficient documentation

## 2015-06-06 LAB — COMPREHENSIVE METABOLIC PANEL
ALT: 46 U/L (ref 17–63)
AST: 85 U/L — ABNORMAL HIGH (ref 15–41)
Albumin: 2.6 g/dL — ABNORMAL LOW (ref 3.5–5.0)
Alkaline Phosphatase: 260 U/L — ABNORMAL HIGH (ref 38–126)
Anion gap: 12 (ref 5–15)
BUN: 8 mg/dL (ref 6–20)
CO2: 21 mmol/L — ABNORMAL LOW (ref 22–32)
Calcium: 8.6 mg/dL — ABNORMAL LOW (ref 8.9–10.3)
Chloride: 101 mmol/L (ref 101–111)
Creatinine, Ser: 0.72 mg/dL (ref 0.61–1.24)
GFR calc Af Amer: >60 mL/min (ref >60)
GFR calc non Af Amer: >60 mL/min (ref >60)
Glucose, Bld: 93 mg/dL (ref 65–99)
Potassium: 3.8 mmol/L (ref 3.5–5.1)
Sodium: 134 mmol/L — ABNORMAL LOW (ref 135–145)
Total Bilirubin: 3 mg/dL — ABNORMAL HIGH (ref 0.3–1.2)
Total Protein: 6.7 g/dL (ref 6.5–8.1)

## 2015-06-06 LAB — PROTIME-INR
INR: 1.53 — ABNORMAL HIGH (ref 0.00–1.49)
Prothrombin Time: 18.4 seconds — ABNORMAL HIGH (ref 11.6–15.2)

## 2015-06-06 LAB — CBC
HCT: 30.6 % — ABNORMAL LOW (ref 39.0–52.0)
Hemoglobin: 10.3 g/dL — ABNORMAL LOW (ref 13.0–17.0)
MCH: 32.6 pg (ref 26.0–34.0)
MCHC: 33.7 g/dL (ref 30.0–36.0)
MCV: 96.8 fL (ref 78.0–100.0)
Platelets: 429 10*3/uL — ABNORMAL HIGH (ref 150–400)
RBC: 3.16 MIL/uL — ABNORMAL LOW (ref 4.22–5.81)
RDW: 16.5 % — ABNORMAL HIGH (ref 11.5–15.5)
WBC: 10.4 10*3/uL (ref 4.0–10.5)

## 2015-06-06 LAB — MAGNESIUM: Magnesium: 1.8 mg/dL (ref 1.7–2.4)

## 2015-06-06 LAB — LACTATE DEHYDROGENASE: LDH: 260 U/L — ABNORMAL HIGH (ref 98–192)

## 2015-06-06 LAB — BRAIN NATRIURETIC PEPTIDE: B Natriuretic Peptide: 245.9 pg/mL — ABNORMAL HIGH (ref 0.0–100.0)

## 2015-06-06 MED ORDER — ENOXAPARIN SODIUM 40 MG/0.4ML ~~LOC~~ SOLN: 40.0000 mg | Freq: Two times a day (BID) | SUBCUTANEOUS | Status: DC

## 2015-06-06 MED ORDER — ENOXAPARIN SODIUM 40 MG/0.4ML ~~LOC~~ SOLN: 35.0000 mg | SUBCUTANEOUS | Status: DC

## 2015-06-06 NOTE — Progress Notes (Addendum)
Symptom Yes No Details  Angina  x Activity:  Claudication  x How far:  Syncope  x When:  Stroke  x   Orthopnea  x How many pillows:  PND  x How often:  CPAP  n/a How many hrs:  Pedal edema  x   Abd fullness  x   N&V  x   Diaphoresis  x When:  Bleeding  x   Urine  x No tea colored urine  SOB  x Activity:  Palpitations  x When:  ICD shock  x   Hospitlizaitons x  When/where/why: S/P LVAD D/C 2/20  ED visit  x When/where/why:  Other MD  x When/who/why:  Activity  x Getting out and about.   Fluid     Diet      Vital Signs:  Doppler BP: 86 Automatic BP: not picking up HR: 99 SPO2: 91 - 96%  Weight: 161.8 lbs Home Weight : 158 - 160 lbs   VAD interrogation & Equipment Management (reviewed with Dr Aundra Dubin): Speed: 9400 Flow: 4.5 Power: 5.4w  PI: 5.9  Alarms: none Events:  30 - 50 events daily (range 3.6 - 8.0) was having 8 - 10 every day in hospital at D/C.   Fixed speed: 9400 Low speed limit: 8800  Primary Controller:  Replace back up battery in  31 months. Back up controller:   Replace back up battery in  31 months.  Annual Equipment Maintenance on UBC/PM was performed on 05/2015.   I reviewed the LVAD parameters from today and compared the results to the patient's prior recorded data.  LVAD interrogation was NEGATIVE for significant power changes, NEGATIVE for clinical alarms and POSITIVE/INCREASED for PI events/speed drops. No programming changes were made and pump is functioning within specified parameters.   LVAD equipment check completed and is in good working order. Back-up equipment present. LVAD education done on emergency procedures and precautions and reviewed exit site care.   Exit Site Care: Drive line is being maintained by his wife [name] daily.   Some light bleeding noted to site. Redness improved. Last dose Keflex today. WBC returned to normal. Proximal suture removed in clinic today. Continue daily dressing care without Aquacel strip (possible  contact allergy).   VAD dressing removed and site care performed using sterile technique. Drive line exit site cleaned with Chlora prep applicators x 2, allowed to dry, and gauze dressing re-applied. Exit site healing, the velour is fully implanted at exit site; no tenderness, drainage, or foul odor noted. Drive line anchor re-applied. Pt denies fever or chills. Driveline dressing is being changed daily per sterile technique.   Encounter Details: Patient presents for his first clinic follow up in [facility] today after implant discharge with his wife [name]. Reports that he is sleeping better, eating better and getting out and about. Reports he is eating very well but not gaining much weight. Had some brief issues with eqt over weekend (see previous telephone note). Denies any dizziness or orthostatic issues with medications. Is taking lasix 40 mg every other day without potassium supplementation currently.   EKG done today per Dr. Claris Gladden request and in NSR rate 90s. Decrease Amiodarone to 200mg  QD.   INR 1.5 today. Anticoagulation management includes INR goal of 2 - 2.5 with 81 mg. See anticoagulation flow sheet. Starting 1/2 dose lovenox per protocol.   LDH stable at 260 with established baseline of 250 -300. Denies tea-colored urine.   MAPs 86 on doppler today. Automatic cuff not picking  up.     Medication Changes at this encounter:  Decrease Amiodarone as above Decrease Lasix to 20 mg twice a week (instructed not to take any further doses until Thursday).   VAD coordinator reviewed daily log from home for daily temperature, weight, and VAD parameters. Pt is performing daily controller and system monitor self tests along with completing weekly and monthly maintenance for LVAD equipment.   RTC in 1 week for F/U visit.   Janene Madeira, RN VAD Coordinator   Office: [phone] 24/7 [place] Pager: [phone]  PCP: Dr Manuella Ghazi [facility]) Cardiology:  Dr. Gwenlyn Found HF Cardiology: Dr. Aundra Dubin  HPI: 50 yo  with nonischemic cardiomyopathy now s/p LVAD presents for CHF clinic followup.  He has had a nonischemic cardiomyopathy since 2002. Echo (12/16) with EF 20-25%, moderate MR, moderately decreased RV systolic function. Cath in 2002 at time of diagnosis with normal coronaries. Has Medtronic ICD.  He was admitted in 1/17 with acute on chronic systolic CHF and was found to be in cardiogenic shock.  Coronary angiography showed no significant CAD.  IABP was placed.  He underwent Heartmate II LVAD placement on 05/10/15.  Post-op course was complicated by atrial flutter, then atrial fibrillation.  He required TEE-guided DCCV.  He had RV failure and was on milrinone for a prolonged time but was able to titrate off and onto Revatio.   He returns for his first followup visit since discharge.  Symptomatically, he seems to be doing well.  Walking around house and has gotten out to a store once.  He is not short of breath with his walks but legs are fatigued.  Appetite is improving.  No lightheadedness.  No BRBPR or melena.  MAP 86 today.   Denies LVAD alarms.  Denies driveline trauma, erythema or drainage.  Denies ICD shocks.   Reports taking Coumadin as prescribed and adherence to anticoagulation based dietary restrictions.  Denies bright red blood per rectum or melena, no dark urine or hematuria.  Of note, LVAD interrogation showed 30-50 PI events daily.   ECG: NSR, LAFB, poor RWP  Labs (2/17): BNP 246, K 3.8, creatinine 0.72, AST 85, tbili 3, LDH 260, hemoglobin 10.3   Past Medical History  Diagnosis Date  . Nonischemic cardiomyopathy ([place])   . ICD (implantable cardioverter-defibrillator), single, in situ 08/26/2007  . Hx of echocardiogram 11/09/1910    Showed an EF of 25% with no significant valve disease.  Marland Kitchen AICD (automatic cardioverter/defibrillator) present   . Presence of permanent cardiac pacemaker   . CHF (congestive heart failure) ([place])    Social History   Social History  . Marital Status:  Married    Spouse Name: N/A  . Number of Children: N/A  . Years of Education: N/A   Occupational History  . Not on file.   Social History Main Topics  . Smoking status: Former Smoker    Quit date: 02/20/1998  . Smokeless tobacco: Not on file  . Alcohol Use: 0.0 oz/week    0 Standard drinks or equivalent per week     Comment: has quit drinking x 1 1/2 months. He sporadically drinks  . Drug Use: No  . Sexual Activity: Yes   Other Topics Concern  . Not on file   Social History Narrative   Family History  Problem Relation Age of Onset  . Hypertension Mother   . Hypertension Father   . Cancer - Prostate Maternal Grandfather     Current Outpatient Prescriptions  Medication Sig Dispense Refill  .  amiodarone (PACERONE) 200 MG tablet Take 1 tablet (200 mg total) by mouth 2 (two) times daily. 45 tablet 6  . aspirin EC 81 MG tablet Take 81 mg by mouth daily.    . cephALEXin (KEFLEX) 500 MG capsule Take 1 capsule (500 mg total) by mouth every 8 (eight) hours. 26 capsule 0  . cetirizine (ZYRTEC) 10 MG tablet Take 10 mg by mouth daily.    . Coenzyme Q10 (COQ-10) 200 MG CAPS Take 1 capsule by mouth daily.    . digoxin (LANOXIN) 0.125 MG tablet Take 0.125 mg by mouth daily.    Marland Kitchen docusate sodium (COLACE) 100 MG capsule Take 2 capsules (200 mg total) by mouth daily as needed for mild constipation. 10 capsule 6  . enoxaparin (LOVENOX) 40 MG/0.4ML injection Inject 0.4 mLs (40 mg total) into the skin every 12 (twelve) hours. 10 Syringe 1  . esomeprazole (NEXIUM) 40 MG capsule Take 40 mg by mouth at bedtime.     . furosemide (LASIX) 20 MG tablet Take 2 tablets (40 mg total) by mouth every other day. My take 40 mg by mouth daily as needed for swelling. 40 tablet 3  . HYDROcodone-acetaminophen (NORCO/VICODIN) 5-325 MG tablet Take 1-2 tablets by mouth every 4 (four) hours as needed for moderate pain or severe pain. 30 tablet 0  . LORazepam (ATIVAN) 0.5 MG tablet Take 0.5 mg by mouth at bedtime.      Marland Kitchen losartan (COZAAR) 25 MG tablet Take 0.5 tablets (12.5 mg total) by mouth daily. 30 tablet 6  . montelukast (SINGULAIR) 10 MG tablet Take 10 mg by mouth at bedtime.    . sildenafil (REVATIO) 20 MG tablet Take 2 tablets (40 mg total) by mouth 3 (three) times daily. 90 tablet 6  . spironolactone (ALDACTONE) 25 MG tablet Take 1 tablet (25 mg total) by mouth daily. 30 tablet 6  . warfarin (COUMADIN) 1 MG tablet Take 1 tablet (1 mg total) by mouth daily. 45 tablet 6   No current facility-administered medications for this encounter.    Penicillins and Sulfa antibiotics  REVIEW OF SYSTEMS: All systems negative except as listed in HPI, PMH and Problem list.   LVAD INTERROGATION:  See nurse's note above.  MAP 86, HR 75 Physical Exam: GENERAL: Well appearing, male who presents to clinic today in no acute distress. HEENT: normal  NECK: Supple, JVP not elevated.  2+ bilaterally, no bruits.  No lymphadenopathy or thyromegaly appreciated.   CARDIAC:  Mechanical heart sounds with LVAD hum present.  LUNGS:  Clear to auscultation bilaterally.  ABDOMEN:  Soft, round, nontender, positive bowel sounds x4.     LVAD exit site: well-healed and incorporated.  Dressing dry and intact.  No erythema or drainage.  Stabilization device present and accurately applied.  Driveline dressing is being changed daily per sterile technique. EXTREMITIES:  Warm and dry, no cyanosis, clubbing, rash or edema  NEUROLOGIC:  Alert and oriented x 4.  Gait steady.  No aphasia.  No dysarthria.  Affect pleasant.     ASSESSMENT AND PLAN:   1. Status post Heartmate II LVAD implantation:  Patient comes to clinic today for routine follow up visit. Currently has NYHA class 2 symptoms on LVAD support. In hospital, he had RV failure.   MAP stable.  Multiple PI events.  - Suspect he is a bit dry.  Decrease Lasix to twice a week.   - Continue digoxin for RV support. Check digoxin level today.  - Continue Revatio 40 mg tid  for RV  support.  2.  Anticoagulation management: INR goal 2.0-3.0.  Will evaluate INR value today and follow up with patient for necessary changes.  He will also continue ASA 81 daily.  No evidence for overt bleeding.  3. Chronic systolic CHF: Nonischemic cardiomyopathy.   - Continue losartan 12.5 mg daily, spironolactone 25 daily, and digoxin.  4. Atrial fibrillation/flutter: He remains in NSR today.   - Continue amiodarone but can decrease to 200 mg daily.  LFTs stable today, he will need TSH checked.  He will need regular eye exams.  - Continue warfarin.   [name] 06/07/2015

## 2015-06-06 NOTE — Patient Instructions (Addendum)
1. Decrease Amiodarone to ONE 200mg  pill once a day.   2. Decrease lasix to ONE tablet twice a week. Next dose on Thursday.   3. Start Lovenox injections twice a day into abdomen--you will get one dose in today and start two injections tomorrow (Tuesday).   Increase your coumadin to TWO 1 mg tablets every Monday Wednesday and Friday. All other days take ONE 1 mg tablet.   Home Health Nurse to check next INR on Thursday.   4. Return to [place] clinic in 1 week.

## 2015-06-07 ENCOUNTER — Other Ambulatory Visit (HOSPITAL_COMMUNITY): Payer: Self-pay | Admitting: *Deleted

## 2015-06-07 DIAGNOSIS — B999 Unspecified infectious disease: Secondary | ICD-10-CM

## 2015-06-07 MED ORDER — CEPHALEXIN 500 MG PO CAPS: 500.0000 mg | ORAL_CAPSULE | Freq: Three times a day (TID) | ORAL | Status: AC

## 2015-06-07 NOTE — Telephone Encounter (Signed)
Called pt per Dr. Prescott Gum. Informed pt he needed 7 more days of Keflex for driveline exit site (reviewed picture of site with Dr. Darcey Nora). Rx sent to local pharmacy. Pt states they have 7 more days of Keflex available at home - instructed him to take that. Pt verbalized understanding of same.

## 2015-06-08 ENCOUNTER — Other Ambulatory Visit (HOSPITAL_COMMUNITY): Payer: Self-pay | Admitting: Infectious Diseases

## 2015-06-08 ENCOUNTER — Telehealth (HOSPITAL_COMMUNITY): Payer: Self-pay | Admitting: *Deleted

## 2015-06-08 ENCOUNTER — Other Ambulatory Visit (HOSPITAL_COMMUNITY): Payer: Self-pay | Admitting: *Deleted

## 2015-06-08 DIAGNOSIS — Z95811 Presence of heart assist device: Secondary | ICD-10-CM

## 2015-06-08 DIAGNOSIS — Z7901 Long term (current) use of anticoagulants: Secondary | ICD-10-CM

## 2015-06-08 DIAGNOSIS — I4819 Other persistent atrial fibrillation: Secondary | ICD-10-CM

## 2015-06-08 DIAGNOSIS — I5022 Chronic systolic (congestive) heart failure: Secondary | ICD-10-CM

## 2015-06-08 DIAGNOSIS — Z889 Allergy status to unspecified drugs, medicaments and biological substances status: Secondary | ICD-10-CM

## 2015-06-08 MED ORDER — AMIODARONE HCL 200 MG PO TABS: 200.0000 mg | ORAL_TABLET | Freq: Every day | ORAL | Status: DC

## 2015-06-08 MED ORDER — MONTELUKAST SODIUM 10 MG PO TABS: 10.0000 mg | ORAL_TABLET | Freq: Every day | ORAL | Status: DC

## 2015-06-08 MED ORDER — WARFARIN SODIUM 1 MG PO TABS: 1.0000 mg | ORAL_TABLET | Freq: Every day | ORAL | Status: DC

## 2015-06-08 MED ORDER — SPIRONOLACTONE 25 MG PO TABS: 25.0000 mg | ORAL_TABLET | Freq: Every day | ORAL | Status: DC

## 2015-06-08 MED ORDER — DIGOXIN 125 MCG PO TABS: 0.1250 mg | ORAL_TABLET | Freq: Every day | ORAL | Status: DC

## 2015-06-08 MED ORDER — LOSARTAN POTASSIUM 25 MG PO TABS: 12.5000 mg | ORAL_TABLET | Freq: Every day | ORAL | Status: DC

## 2015-06-08 NOTE — Telephone Encounter (Signed)
Rx sent to [facility] as requested by patient's wife. Only Rx not sent included the following:  Lorazapam--will not be able to get in 4m supply and patient will need to call existing pharmacy to transfer controlled script to another pharmacy.   Lasix--still titration diuretics and have a feeling he will be only on PRN lasix. No need for 90d supply at this time.   Janene Madeira, RN VAD Coordinator   Office: [phone] 24/7 VAD Pager: [phone]

## 2015-06-08 NOTE — Telephone Encounter (Signed)
Debbie, PT left mess earlier she needs VO ok to change PT session to once a week for next 2 weeks due to pt's appointment, left a mess for her this was ok

## 2015-06-09 ENCOUNTER — Other Ambulatory Visit (HOSPITAL_COMMUNITY): Payer: Self-pay | Admitting: *Deleted

## 2015-06-09 ENCOUNTER — Ambulatory Visit (HOSPITAL_COMMUNITY): Payer: Self-pay | Admitting: *Deleted

## 2015-06-09 DIAGNOSIS — Z95811 Presence of heart assist device: Secondary | ICD-10-CM

## 2015-06-09 DIAGNOSIS — Z7409 Other reduced mobility: Secondary | ICD-10-CM

## 2015-06-09 LAB — POCT INR: INR: 1.6

## 2015-06-13 ENCOUNTER — Other Ambulatory Visit (HOSPITAL_COMMUNITY): Payer: Self-pay | Admitting: *Deleted

## 2015-06-13 DIAGNOSIS — Z95811 Presence of heart assist device: Secondary | ICD-10-CM

## 2015-06-13 DIAGNOSIS — Z5181 Encounter for therapeutic drug level monitoring: Secondary | ICD-10-CM

## 2015-06-13 DIAGNOSIS — Z79899 Other long term (current) drug therapy: Secondary | ICD-10-CM

## 2015-06-13 DIAGNOSIS — Z7901 Long term (current) use of anticoagulants: Secondary | ICD-10-CM

## 2015-06-14 ENCOUNTER — Ambulatory Visit (HOSPITAL_COMMUNITY): Payer: Self-pay | Admitting: *Deleted

## 2015-06-14 ENCOUNTER — Ambulatory Visit (HOSPITAL_COMMUNITY)
Admission: RE | Admit: 2015-06-14 | Discharge: 2015-06-14 | Disposition: A | Discharge status: Home / Self Care | Payer: Medicare Other | Source: Ambulatory Visit | Attending: Cardiology | Admitting: Cardiology

## 2015-06-14 VITALS — BP 104/0 | HR 91 | Ht 71.0 in | Wt 155.6 lb

## 2015-06-14 DIAGNOSIS — Z7982 Long term (current) use of aspirin: Secondary | ICD-10-CM | POA: Diagnosis not present

## 2015-06-14 DIAGNOSIS — I480 Paroxysmal atrial fibrillation: Secondary | ICD-10-CM

## 2015-06-14 DIAGNOSIS — Z8249 Family history of ischemic heart disease and other diseases of the circulatory system: Secondary | ICD-10-CM | POA: Insufficient documentation

## 2015-06-14 DIAGNOSIS — Z95811 Presence of heart assist device: Secondary | ICD-10-CM | POA: Diagnosis not present

## 2015-06-14 DIAGNOSIS — I4892 Unspecified atrial flutter: Secondary | ICD-10-CM | POA: Insufficient documentation

## 2015-06-14 DIAGNOSIS — Z79899 Other long term (current) drug therapy: Secondary | ICD-10-CM | POA: Insufficient documentation

## 2015-06-14 DIAGNOSIS — Z5181 Encounter for therapeutic drug level monitoring: Secondary | ICD-10-CM | POA: Insufficient documentation

## 2015-06-14 DIAGNOSIS — I428 Other cardiomyopathies: Secondary | ICD-10-CM | POA: Insufficient documentation

## 2015-06-14 DIAGNOSIS — Z87891 Personal history of nicotine dependence: Secondary | ICD-10-CM | POA: Diagnosis not present

## 2015-06-14 DIAGNOSIS — I4891 Unspecified atrial fibrillation: Secondary | ICD-10-CM | POA: Insufficient documentation

## 2015-06-14 DIAGNOSIS — Z7901 Long term (current) use of anticoagulants: Secondary | ICD-10-CM | POA: Insufficient documentation

## 2015-06-14 DIAGNOSIS — I5022 Chronic systolic (congestive) heart failure: Secondary | ICD-10-CM

## 2015-06-14 LAB — CBC
HCT: 31.1 % — ABNORMAL LOW (ref 39.0–52.0)
Hemoglobin: 10.1 g/dL — ABNORMAL LOW (ref 13.0–17.0)
MCH: 32.3 pg (ref 26.0–34.0)
MCHC: 32.5 g/dL (ref 30.0–36.0)
MCV: 99.4 fL (ref 78.0–100.0)
Platelets: 442 10*3/uL — ABNORMAL HIGH (ref 150–400)
RBC: 3.13 MIL/uL — ABNORMAL LOW (ref 4.22–5.81)
RDW: 15.5 % (ref 11.5–15.5)
WBC: 7.2 10*3/uL (ref 4.0–10.5)

## 2015-06-14 LAB — BASIC METABOLIC PANEL
Anion gap: 10 (ref 5–15)
BUN: 9 mg/dL (ref 6–20)
CO2: 23 mmol/L (ref 22–32)
Calcium: 8.9 mg/dL (ref 8.9–10.3)
Chloride: 103 mmol/L (ref 101–111)
Creatinine, Ser: 0.73 mg/dL (ref 0.61–1.24)
GFR calc Af Amer: >60 mL/min (ref >60)
GFR calc non Af Amer: >60 mL/min (ref >60)
Glucose, Bld: 92 mg/dL (ref 65–99)
Potassium: 4.2 mmol/L (ref 3.5–5.1)
Sodium: 136 mmol/L (ref 135–145)

## 2015-06-14 LAB — LACTATE DEHYDROGENASE: LDH: 215 U/L — ABNORMAL HIGH (ref 98–192)

## 2015-06-14 LAB — PROTIME-INR
INR: 1.53 — ABNORMAL HIGH (ref 0.00–1.49)
Prothrombin Time: 18.5 seconds — ABNORMAL HIGH (ref 11.6–15.2)

## 2015-06-14 LAB — TSH: TSH: 7.207 u[IU]/mL — ABNORMAL HIGH (ref 0.350–4.500)

## 2015-06-14 LAB — DIGOXIN LEVEL: Digoxin Level: 0.8 ng/mL (ref 0.8–2.0)

## 2015-06-14 MED ORDER — WARFARIN SODIUM 2 MG PO TABS: ORAL_TABLET | ORAL | Status: DC

## 2015-06-14 NOTE — Patient Instructions (Addendum)
1.  Take Losartan in the evening. 2.  Increase your fluid intake. 3.  Take coumadin 5 mg today, then increase your coumadin dose to 3 mg daily and re-check INR Friday. You will need to continue your Lovenox injections. 4.  Return to [place] clinic in one week with ramp echo to be scheduled. 5.  Advance dressing changes to every other day; use aquacel silver strips. If drainage occurs - go back to daily dressing change to keep site clean and dry.

## 2015-06-14 NOTE — Progress Notes (Addendum)
Symptom Yes No Details  Angina  x Activity:  Claudication  x How far:  Syncope  x When:  Stroke  x   Orthopnea  x How many pillows:  PND  x How often:  CPAP  n/a How many hrs:  Pedal edema  x   Abd fullness  x   N&V  x   Diaphoresis  x When:  Bleeding  x   Urine  x Clear yellow  SOB  x Activity:  Palpitations  x When:  ICD shock  x   Hospitlizaitons  x When/where/why:   ED visit  x When/where/why:  Other MD  x When/who/why:  Activity  x Home rehab 2 x week  Fluid   < 2000  Diet   Low sodium   Vital Signs:  Doppler modified systolic:  123456 Automatic BP:  112/76 (92) HR: 91 SPO2:  99 %  Weight: 155.6  lbs Home Weight: 153 - 158 lbs   VAD interrogation & Equipment Management (reviewed with Dr Aundra Dubin): Speed: 9400 Flow: 4.5 Power:  5.6 PI:  6.5  Alarms: none Events:  30 - 50 events daily (range 3.6 - 8.0)   Fixed speed: 9400 Low speed limit: 8800  Primary Controller:  Replace back up battery in 30 months. Back up controller:   Replace back up battery in 30 months.  I reviewed the LVAD parameters from today and compared the results to the patient's prior recorded data.  LVAD interrogation was NEGATIVE for significant power changes, NEGATIVE for clinical alarms and POSITIVE for PI events/speed drops. No programming changes were made and pump is functioning within specified parameters. Will add ramp echo to next clinic visit to ascertain correct speed.   LVAD equipment check completed and is in good working order. Back-up equipment present. LVAD education done on emergency procedures and precautions and reviewed exit site care.   VAD coordinator reviewed daily log from home for daily temperature, weight, and VAD parameters. Pt is performing daily controller and system monitor self tests along with completing weekly and monthly maintenance for LVAD equipment.    Exit Site Care: Drive line is being maintained by his wife [name] daily. Redness remains the same, decreased  amount of drainage. Keflex extended for another week last clinic visit per Dr. Darcey Nora.  VAD dressing removed and site care performed using sterile technique. Drive line exit site cleaned with Chlora prep applicators x 2, allowed to dry, and gauze dressing with aquacel silver strip on exit site applied. Exit site healing, the velour is fully implanted at exit site; no tenderness, drainage, or foul odor noted. Drive line anchor re-applied. Pt denies fever or chills. Driveline dressing is being changed daily per sterile technique.  Advance to every other day dressing changes and will add back aquacel silver strip on exit site, if redness increases, will stop using silver again.   If drainage increases, wife knows to go back to daily dressing changes.  Adequate dressing supplies provided for home use.      Encounter Details:   INR 1.53 today. Anticoagulation management includes INR goal of 2 - 2.5 with 81 mg. See anticoagulation flow sheet. Will continue 1/2 dose lovenox per protocol.   LDH stable at 215 with established baseline of 250 -300. Denies tea-colored urine.   MAP 92 with auto cuff BP 112/76; doppler modified systolic 123456.    Pt reported beeping from ICD every morning and every evening.  [employer] rep in and performed full ICD analysis. No alarms  identified, no arrhythmias, shocks, or lead impedence problems identified.  Shock therapies on at HR 207 bpm. Advised pt to send home monitor check to device clinic. Pt has equipment at home to do so.    Patient Instructions:  1.  Take Losartan in the evening. 2.  Increase your fluid intake. 3.  Take coumadin 5 mg today, then increase your coumadin dose to 3 mg daily and re-check INR Friday. You will need to continue your Lovenox injections. 4.  Return to [facility] in one week with ramp echo to be scheduled. 5.  Advance dressing changes to every other day; use aquacel silver strips. If drainage occurs - go back to daily dressing change to keep  site clean and dry.  Patrick Girt, RN VAD Coordinator  Office: [phone] 24/[address] [place] Pager: [phone]  PCP: Dr Manuella Ghazi [facility]) Cardiology:  Dr. Gwenlyn Found HF Cardiology: Dr. Aundra Dubin  HPI: 50 yo with nonischemic cardiomyopathy now s/p LVAD presents for CHF clinic followup.  He has had a nonischemic cardiomyopathy since 2002. Echo (12/16) with EF 20-25%, moderate MR, moderately decreased RV systolic function. Cath in 2002 at time of diagnosis with normal coronaries. Has Medtronic ICD.  He was admitted in 1/17 with acute on chronic systolic CHF and was found to be in cardiogenic shock.  Coronary angiography showed no significant CAD.  IABP was placed.  He underwent Heartmate II LVAD placement on 05/10/15.  Post-op course was complicated by atrial flutter, then atrial fibrillation.  He required TEE-guided DCCV.  He had RV failure and was on milrinone for a prolonged time but was able to titrate off and onto Revatio.   He returns for followup.  Symptomatically, he seems to be doing well.  Occasional lightheadedness with standing.  He is not short of breath with his walks but legs are fatigued.  Appetite is improving.  No lightheadedness.  No BRBPR or melena.  MAP 92 today.  Still having a lot of PI events.   Denies LVAD alarms.  Denies driveline trauma, erythema or drainage.  Denies ICD shocks.   Reports taking Coumadin as prescribed and adherence to anticoagulation based dietary restrictions.  Denies bright red blood per rectum or melena, no dark urine or hematuria.    Labs (2/17): BNP 246, K 3.8, creatinine 0.72, AST 85, tbili 3, LDH 260, hemoglobin 10.3  Past Medical History  Diagnosis Date  . Nonischemic cardiomyopathy ([place])   . ICD (implantable cardioverter-defibrillator), single, in situ 08/26/2007  . Hx of echocardiogram 11/09/1910    Showed an EF of 25% with no significant valve disease.  Marland Kitchen AICD (automatic cardioverter/defibrillator) present   . Presence of permanent cardiac pacemaker   . CHF  (congestive heart failure) ([place])    Social History   Social History  . Marital Status: Married    Spouse Name: N/A  . Number of Children: N/A  . Years of Education: N/A   Occupational History  . Not on file.   Social History Main Topics  . Smoking status: Former Smoker    Quit date: 02/20/1998  . Smokeless tobacco: Not on file  . Alcohol Use: 0.0 oz/week    0 Standard drinks or equivalent per week     Comment: has quit drinking x 1 1/2 months. He sporadically drinks  . Drug Use: No  . Sexual Activity: Yes   Other Topics Concern  . Not on file   Social History Narrative   Family History  Problem Relation Age of Onset  . Hypertension Mother   .  Hypertension Father   . Cancer - Prostate Maternal Grandfather     Current Outpatient Prescriptions  Medication Sig Dispense Refill  . amiodarone (PACERONE) 200 MG tablet Take 1 tablet (200 mg total) by mouth daily. 90 tablet 3  . aspirin EC 81 MG tablet Take 81 mg by mouth daily.    . cephALEXin (KEFLEX) 500 MG capsule Take 1 capsule (500 mg total) by mouth every 8 (eight) hours. 21 capsule 0  . cetirizine (ZYRTEC) 10 MG tablet Take 10 mg by mouth daily.    . Coenzyme Q10 (COQ-10) 200 MG CAPS Take 1 capsule by mouth daily.    . digoxin (LANOXIN) 0.125 MG tablet Take 1 tablet (0.125 mg total) by mouth daily. 90 tablet 3  . docusate sodium (COLACE) 100 MG capsule Take 2 capsules (200 mg total) by mouth daily as needed for mild constipation. 10 capsule 6  . enoxaparin (LOVENOX) 40 MG/0.4ML injection Inject 0.4 mLs (40 mg total) into the skin every 12 (twelve) hours. 10 Syringe 1  . esomeprazole (NEXIUM) 40 MG capsule Take 40 mg by mouth at bedtime.     Marland Kitchen LORazepam (ATIVAN) 0.5 MG tablet Take 0.5 mg by mouth at bedtime.    Marland Kitchen losartan (COZAAR) 25 MG tablet Take 0.5 tablets (12.5 mg total) by mouth daily. 90 tablet 3  . montelukast (SINGULAIR) 10 MG tablet Take 1 tablet (10 mg total) by mouth at bedtime. 90 tablet 3  . sildenafil  (REVATIO) 20 MG tablet Take 2 tablets (40 mg total) by mouth 3 (three) times daily. 90 tablet 6  . spironolactone (ALDACTONE) 25 MG tablet Take 1 tablet (25 mg total) by mouth daily. 90 tablet 3  . warfarin (COUMADIN) 1 MG tablet Take 1 tablet (1 mg total) by mouth daily. (Patient taking differently: Take 1 mg by mouth daily. Take as directed) 120 tablet 3  . furosemide (LASIX) 20 MG tablet Take 2 tablets (40 mg total) by mouth every other day. My take 40 mg by mouth daily as needed for swelling. (Patient not taking: Reported on 06/14/2015) 40 tablet 3  . warfarin (COUMADIN) 2 MG tablet Take as directed 60 tablet 6   No current facility-administered medications for this encounter.    Penicillins and Sulfa antibiotics  REVIEW OF SYSTEMS: All systems negative except as listed in HPI, PMH and Problem list.   LVAD INTERROGATION:  See nurse's note above.  BP 104/0 mmHg  Pulse 91  Ht 5\' 11"  (1.803 m)  Wt 155 lb 9.6 oz (70.58 kg)  BMI 21.71 kg/m2  SpO2 99% MAP 92 Physical Exam: GENERAL: Well appearing, male who presents to clinic today in no acute distress. HEENT: normal  NECK: Supple, JVP not elevated.  2+ bilaterally, no bruits.  No lymphadenopathy or thyromegaly appreciated.   CARDIAC:  Mechanical heart sounds with LVAD hum present.  LUNGS:  Clear to auscultation bilaterally.  ABDOMEN:  Soft, round, nontender, positive bowel sounds x4.     LVAD exit site: well-healed and incorporated.  Dressing dry and intact.  No erythema or drainage.  Stabilization device present and accurately applied.  Driveline dressing is being changed daily per sterile technique. EXTREMITIES:  Warm and dry, no cyanosis, clubbing, rash or edema  NEUROLOGIC:  Alert and oriented x 4.  Gait steady.  No aphasia.  No dysarthria.  Affect pleasant.     ASSESSMENT AND PLAN:   1. Status post Heartmate II LVAD implantation:  Patient comes to clinic today for routine follow up visit.  Currently has NYHA class 2 symptoms on  LVAD support. In hospital, he had RV failure.  MAP stable.  Multiple PI events.  - He is now off Lasix.  I encouraged him to stay hydrated.  I will arrange for ramp echo next week to make sure that we are not drawing the septum leftwards.   - Continue digoxin for RV support, follow levels.  - Continue Revatio 40 mg tid for RV support.  - Start cardiac rehab at [facility].  2.  Anticoagulation management: INR goal 2.0-2.5.  He will also continue ASA 81 daily.  No evidence for overt bleeding.  - INR 1.7 today, will give Lovenox per protocol.  3. Chronic systolic CHF: Nonischemic cardiomyopathy.   - Continue losartan 12.5 mg daily, spironolactone 25 daily, and digoxin.  4. Atrial fibrillation/flutter: He remains in NSR today.   - Continue amiodarone 200 mg daily.  LFTs/TSH stable recently.  He will need regular eye exams.  - Continue warfarin.   [name] 06/14/2015

## 2015-06-15 ENCOUNTER — Ambulatory Visit (INDEPENDENT_AMBULATORY_CARE_PROVIDER_SITE_OTHER): Payer: Medicare Other | Admitting: Internal Medicine

## 2015-06-15 LAB — T4: T4, Total: 9.9 ug/dL (ref 4.5–12.0)

## 2015-06-16 ENCOUNTER — Encounter (INDEPENDENT_AMBULATORY_CARE_PROVIDER_SITE_OTHER): Payer: Self-pay | Admitting: Internal Medicine

## 2015-06-16 ENCOUNTER — Ambulatory Visit (INDEPENDENT_AMBULATORY_CARE_PROVIDER_SITE_OTHER): Payer: Medicare Other | Admitting: *Deleted

## 2015-06-16 DIAGNOSIS — I429 Cardiomyopathy, unspecified: Secondary | ICD-10-CM | POA: Diagnosis not present

## 2015-06-16 DIAGNOSIS — I428 Other cardiomyopathies: Secondary | ICD-10-CM

## 2015-06-17 ENCOUNTER — Ambulatory Visit (HOSPITAL_COMMUNITY): Payer: Self-pay | Admitting: *Deleted

## 2015-06-17 LAB — POCT INR: INR: 1.7

## 2015-06-20 NOTE — Progress Notes (Signed)
Remote ICD transmission.   

## 2015-06-21 ENCOUNTER — Encounter (HOSPITAL_COMMUNITY): Payer: Medicare Other

## 2015-06-22 ENCOUNTER — Ambulatory Visit (HOSPITAL_COMMUNITY)
Admission: RE | Admit: 2015-06-22 | Discharge: 2015-06-22 | Disposition: A | Discharge status: Home / Self Care | Payer: Medicare Other | Source: Ambulatory Visit

## 2015-06-22 ENCOUNTER — Ambulatory Visit (HOSPITAL_COMMUNITY)
Admission: RE | Admit: 2015-06-22 | Discharge: 2015-06-22 | Disposition: A | Discharge status: Home / Self Care | Payer: Medicare Other | Source: Ambulatory Visit | Attending: Internal Medicine | Admitting: Internal Medicine

## 2015-06-22 ENCOUNTER — Ambulatory Visit (HOSPITAL_BASED_OUTPATIENT_CLINIC_OR_DEPARTMENT_OTHER)
Admission: RE | Admit: 2015-06-22 | Discharge: 2015-06-22 | Disposition: A | Discharge status: Home / Self Care | Payer: Medicare Other | Source: Ambulatory Visit | Attending: Internal Medicine | Admitting: Internal Medicine

## 2015-06-22 ENCOUNTER — Other Ambulatory Visit (HOSPITAL_COMMUNITY): Payer: Self-pay | Admitting: *Deleted

## 2015-06-22 ENCOUNTER — Ambulatory Visit (HOSPITAL_COMMUNITY): Payer: Self-pay | Admitting: *Deleted

## 2015-06-22 ENCOUNTER — Other Ambulatory Visit (HOSPITAL_COMMUNITY): Payer: Self-pay | Admitting: Internal Medicine

## 2015-06-22 VITALS — BP 90/0 | HR 85 | Ht 71.0 in | Wt 161.0 lb

## 2015-06-22 DIAGNOSIS — Z8249 Family history of ischemic heart disease and other diseases of the circulatory system: Secondary | ICD-10-CM | POA: Diagnosis not present

## 2015-06-22 DIAGNOSIS — Z7901 Long term (current) use of anticoagulants: Secondary | ICD-10-CM

## 2015-06-22 DIAGNOSIS — I4892 Unspecified atrial flutter: Secondary | ICD-10-CM | POA: Diagnosis not present

## 2015-06-22 DIAGNOSIS — I509 Heart failure, unspecified: Secondary | ICD-10-CM

## 2015-06-22 DIAGNOSIS — I50810 Right heart failure, unspecified: Secondary | ICD-10-CM

## 2015-06-22 DIAGNOSIS — Z95811 Presence of heart assist device: Secondary | ICD-10-CM

## 2015-06-22 DIAGNOSIS — I429 Cardiomyopathy, unspecified: Secondary | ICD-10-CM

## 2015-06-22 DIAGNOSIS — I5022 Chronic systolic (congestive) heart failure: Secondary | ICD-10-CM

## 2015-06-22 DIAGNOSIS — I428 Other cardiomyopathies: Secondary | ICD-10-CM | POA: Diagnosis not present

## 2015-06-22 DIAGNOSIS — Z87891 Personal history of nicotine dependence: Secondary | ICD-10-CM | POA: Diagnosis not present

## 2015-06-22 DIAGNOSIS — Z7982 Long term (current) use of aspirin: Secondary | ICD-10-CM | POA: Insufficient documentation

## 2015-06-22 DIAGNOSIS — Z79899 Other long term (current) drug therapy: Secondary | ICD-10-CM | POA: Diagnosis not present

## 2015-06-22 DIAGNOSIS — I4891 Unspecified atrial fibrillation: Secondary | ICD-10-CM | POA: Diagnosis not present

## 2015-06-22 LAB — BASIC METABOLIC PANEL
Anion gap: 11 (ref 5–15)
BUN: 9 mg/dL (ref 6–20)
CO2: 24 mmol/L (ref 22–32)
Calcium: 9.2 mg/dL (ref 8.9–10.3)
Chloride: 105 mmol/L (ref 101–111)
Creatinine, Ser: 0.68 mg/dL (ref 0.61–1.24)
GFR calc Af Amer: >60 mL/min (ref >60)
GFR calc non Af Amer: >60 mL/min (ref >60)
Glucose, Bld: 97 mg/dL (ref 65–99)
Potassium: 4.1 mmol/L (ref 3.5–5.1)
Sodium: 140 mmol/L (ref 135–145)

## 2015-06-22 LAB — CBC
HCT: 31.7 % — ABNORMAL LOW (ref 39.0–52.0)
Hemoglobin: 10.4 g/dL — ABNORMAL LOW (ref 13.0–17.0)
MCH: 32.6 pg (ref 26.0–34.0)
MCHC: 32.8 g/dL (ref 30.0–36.0)
MCV: 99.4 fL (ref 78.0–100.0)
Platelets: 301 10*3/uL (ref 150–400)
RBC: 3.19 MIL/uL — ABNORMAL LOW (ref 4.22–5.81)
RDW: 14.5 % (ref 11.5–15.5)
WBC: 6 10*3/uL (ref 4.0–10.5)

## 2015-06-22 LAB — ECHOCARDIOGRAM LIMITED
Height: 71 in
Weight: 2576 oz

## 2015-06-22 LAB — TSH: TSH: 5.092 u[IU]/mL — ABNORMAL HIGH (ref 0.350–4.500)

## 2015-06-22 LAB — PROTIME-INR
INR: 2.17 — ABNORMAL HIGH (ref 0.00–1.49)
Prothrombin Time: 24 seconds — ABNORMAL HIGH (ref 11.6–15.2)

## 2015-06-22 LAB — LACTATE DEHYDROGENASE: LDH: 194 U/L — ABNORMAL HIGH (ref 98–192)

## 2015-06-22 MED FILL — SILDENAFIL 20 MG TABLET: 20 | 15 days supply | Qty: 90 | Fill #1

## 2015-06-22 NOTE — Progress Notes (Addendum)
Speed  Flow  PI  Power  LVIDD  AI  Aortic openings  MR  TR  Septum  RV   9400 5.4 6.1 6.0 4.71 trivial Every other beat trivial trivial Midline to pulling to left Mild HK   9200  5.6 6.6 5.7 5.1 trivial Every other beat trivial  Midline, bounce to left   9000  5.6 6.9 5.4 5.25  5/5 mild  midline    Doppler modified systolic: 90 Auto [id number] (71)   Ramp ECHO performed in echo lab per Dr. Aundra Dubin.  At completion of ramp study, patients primary and back up controller programmed:  Fixed speed:  9200 Low speed limit:  8600   Symptom Yes No Details  Angina  x Activity:  Claudication  x How far:  Syncope  x When:  Stroke  x   Orthopnea  x How many pillows:  PND  x How often:  CPAP  n/a How many hrs:  Pedal edema  x   Abd fullness  x   N&V  x   Diaphoresis  x When:  Bleeding  x   Urine  x Clear yellow  SOB  x Activity:  Palpitations  x When:  ICD shock  x   Hospitlizaitons  x When/where/why:   ED visit  x When/where/why:  Other MD  x When/who/why:  Activity  x Home rehab 2 x week - last day yesterday. Will start rehab at [facility]  Fluid   < 2000  Diet   Low sodium   Vital Signs:  Doppler modified systolic: 90 Automatic BP: 90/55 (71) HR: 85 SPO2:  100 % Weight: 161  Lbs Last Weight: 155.6 lbs   VAD interrogation & Equipment Management (reviewed with Dr Aundra Dubin): Speed: 9200 Flow: 4.8 Power:  5.3 PI:  6.9 Alarms: none Events:  20 - 30 day on 9400 speed  Fixed speed: 9200 Low speed limit: 8600  Primary Controller:  Replace back up battery in 30 months. Back up controller:   Replace back up battery in 30 months.  I reviewed the LVAD parameters from today and compared the results to the patient's prior recorded data.  LVAD interrogation was NEGATIVE for significant power changes, NEGATIVE for clinical alarms and had less PI events/speed drops than last week. Programming change made with ramp echo in dropping speed from 9400 to 9200 RPM.   LVAD equipment check  completed and is in good working order. Back-up equipment present. LVAD education done on emergency procedures and precautions and reviewed exit site care.   VAD coordinator reviewed daily log from home for daily temperature, weight, and VAD parameters. Pt is performing daily controller and system monitor self tests along with completing weekly and monthly maintenance for LVAD equipment.    Exit Site Care: Drive line is being maintained by his wife [name] every other day. Redness is less with crusting noted on exit site since adding back Aqaucel silver strip. VAD dressing removed and site care performed using sterile technique. Drive line exit site cleaned with Chlora prep applicators x 2, allowed to dry, and gauze dressing with aquacel silver strip on exit site applied. Exit site healing, the velour is fully implanted at exit site; no tenderness, drainage, or foul odor noted. Drive line anchor re-applied. Pt denies fever or chills. Driveline dressing is being changed every other day, will advance to every three days.  Adequate dressing supplies for home use.  Encounter Details:   INR 2.17 today. Anticoagulation management includes INR goal  of 2 - 2.5 with 81 mg. See anticoagulation flow sheet. Will begin getting local INRs at [facility].   LDH stable at 194 with established baseline of 250 -300. Denies tea-colored urine.   MAP 71   Patient Instructions: 1. Will call you with lab results and coumadin instructions. 2.  Advance to every three day dressing change.  3.. Return to clinic in two weeks.  Zada Girt, RN VAD Coordinator  Office: [phone] 24/7 [place] Pager: [phone]  PCP: Dr Manuella Ghazi [facility]) Cardiology:  Dr. Gwenlyn Found HF Cardiology: Dr. Aundra Dubin  HPI: 50 yo with nonischemic cardiomyopathy now s/p LVAD presents for CHF clinic followup.  He has had a nonischemic cardiomyopathy since 2002. Echo (12/16) with EF 20-25%, moderate MR, moderately decreased RV systolic function. Cath in 2002 at time  of diagnosis with normal coronaries. Has Medtronic ICD.  He was admitted in 1/17 with acute on chronic systolic CHF and was found to be in cardiogenic shock.  Coronary angiography showed no significant CAD.  IABP was placed.  He underwent Heartmate II LVAD placement on 05/10/15.  Post-op course was complicated by atrial flutter, then atrial fibrillation.  He required TEE-guided DCCV.  He had RV failure and was on milrinone for a prolonged time but was able to titrate off and onto Revatio.   He returns for followup.  He is doing very well symptomatically.  He denies significant exertional dyspnea.  Rare lightheadedness if he stands too fast.  Gaining weight but eating more.  INR therapeutic today.    Denies LVAD alarms.  Denies driveline trauma, erythema or drainage.  Denies ICD shocks.   Reports taking Coumadin as prescribed and adherence to anticoagulation based dietary restrictions.  Denies bright red blood per rectum or melena, no dark urine or hematuria.  Still with about 20 PI events/day (somewhat less than prior).   Labs (2/17): BNP 246, K 3.8, creatinine 0.72, AST 85, tbili 3, LDH 260, hemoglobin 10.3 Labs (3/17): K 4.2, creatinine 0.73, HCT 31.1, TSH 7.21 (elevated)  Past Medical History  Diagnosis Date  . Nonischemic cardiomyopathy ([place])   . ICD (implantable cardioverter-defibrillator), single, in situ 08/26/2007  . Hx of echocardiogram 11/09/1910    Showed an EF of 25% with no significant valve disease.  Marland Kitchen AICD (automatic cardioverter/defibrillator) present   . Presence of permanent cardiac pacemaker   . CHF (congestive heart failure) ([place])    Social History   Social History  . Marital Status: Married    Spouse Name: N/A  . Number of Children: N/A  . Years of Education: N/A   Occupational History  . Not on file.   Social History Main Topics  . Smoking status: Former Smoker    Quit date: 02/20/1998  . Smokeless tobacco: Not on file  . Alcohol Use: 0.0 oz/week    0  Standard drinks or equivalent per week     Comment: has quit drinking x 1 1/2 months. He sporadically drinks  . Drug Use: No  . Sexual Activity: Yes   Other Topics Concern  . Not on file   Social History Narrative   Family History  Problem Relation Age of Onset  . Hypertension Mother   . Hypertension Father   . Cancer - Prostate Maternal Grandfather     Current Outpatient Prescriptions  Medication Sig Dispense Refill  . amiodarone (PACERONE) 200 MG tablet Take 1 tablet (200 mg total) by mouth daily. 90 tablet 3  . aspirin EC 81 MG tablet Take 81 mg by  mouth daily.    . cetirizine (ZYRTEC) 10 MG tablet Take 10 mg by mouth daily.    . digoxin (LANOXIN) 0.125 MG tablet Take 1 tablet (0.125 mg total) by mouth daily. 90 tablet 3  . docusate sodium (COLACE) 100 MG capsule Take 2 capsules (200 mg total) by mouth daily as needed for mild constipation. 10 capsule 6  . esomeprazole (NEXIUM) 40 MG capsule Take 40 mg by mouth at bedtime.     Marland Kitchen LORazepam (ATIVAN) 0.5 MG tablet Take 0.5 mg by mouth at bedtime.    Marland Kitchen losartan (COZAAR) 25 MG tablet Take 0.5 tablets (12.5 mg total) by mouth daily. 90 tablet 3  . montelukast (SINGULAIR) 10 MG tablet Take 1 tablet (10 mg total) by mouth at bedtime. 90 tablet 3  . sildenafil (REVATIO) 20 MG tablet Take 2 tablets (40 mg total) by mouth 3 (three) times daily. 90 tablet 6  . spironolactone (ALDACTONE) 25 MG tablet Take 1 tablet (25 mg total) by mouth daily. 90 tablet 3  . warfarin (COUMADIN) 1 MG tablet Take 1 tablet (1 mg total) by mouth daily. (Patient taking differently: Take 1 mg by mouth daily. Take as directed) 120 tablet 3  . warfarin (COUMADIN) 2 MG tablet Take as directed 60 tablet 6  . Coenzyme Q10 (COQ-10) 200 MG CAPS Take 1 capsule by mouth daily. Reported on 06/22/2015    . enoxaparin (LOVENOX) 40 MG/0.4ML injection Inject 0.4 mLs (40 mg total) into the skin every 12 (twelve) hours. (Patient not taking: Reported on 06/22/2015) 10 Syringe 1  .  furosemide (LASIX) 20 MG tablet Take 2 tablets (40 mg total) by mouth every other day. My take 40 mg by mouth daily as needed for swelling. (Patient not taking: Reported on 06/14/2015) 40 tablet 3   No current facility-administered medications for this encounter.    Penicillins and Sulfa antibiotics  REVIEW OF SYSTEMS: All systems negative except as listed in HPI, PMH and Problem list.   LVAD INTERROGATION:  See nurse's note above.  BP 90/0 mmHg  Pulse 85  Ht 5\' 11"  (1.803 m)  Wt 161 lb (73.029 kg)  BMI 22.46 kg/m2  SpO2 98% MAP 71 Physical Exam: GENERAL: Well appearing, male who presents to clinic today in no acute distress. HEENT: normal  NECK: Supple, JVP not elevated.  2+ bilaterally, no bruits.  No lymphadenopathy or thyromegaly appreciated.   CARDIAC:  Mechanical heart sounds with LVAD hum present.  LUNGS:  Clear to auscultation bilaterally.  ABDOMEN:  Soft, round, nontender, positive bowel sounds x4.     LVAD exit site: well-healed and incorporated.  Dressing dry and intact.  No erythema or drainage.  Stabilization device present and accurately applied.  Driveline dressing is being changed daily per sterile technique. EXTREMITIES:  Warm and dry, no cyanosis, clubbing, rash or edema  NEUROLOGIC:  Alert and oriented x 4.  Gait steady.  No aphasia.  No dysarthria.  Affect pleasant.     ASSESSMENT AND PLAN:   1. Status post Heartmate II LVAD implantation:  Patient comes to clinic today for routine follow up visit. Currently has NYHA class 2 symptoms on LVAD support. In hospital, he had RV failure.  MAP stable.  Multiple PI events. We did a ramp study today and decreased speed down to 9200.   - He will stay off Lasix.   - Continue digoxin for RV support, follow levels.  - Continue Revatio 40 mg tid for RV support.  - Start cardiac rehab  at [facility] this week.  2.  Anticoagulation management: INR goal 2.0-2.5.  He will also continue ASA 81 daily.  No evidence for overt  bleeding.  - INR therapeutic today.  3. Chronic systolic CHF: Nonischemic cardiomyopathy.   - Continue losartan 12.5 mg daily, spironolactone 25 daily, and digoxin.  4. Atrial fibrillation/flutter: He remains in NSR today.   - Continue amiodarone 200 mg daily.  LFTs stable recently.  TSH elevated, will need to repeat TSH with free T3 and free T4.  He will need regular eye exams.  - Continue warfarin.   [name] 06/23/2015

## 2015-06-22 NOTE — Patient Instructions (Signed)
1. Will call you with lab results and coumadin instructions. 2.  Advance to every three day dressing change.  3.. Return to clinic in two weeks.

## 2015-06-23 ENCOUNTER — Other Ambulatory Visit (HOSPITAL_COMMUNITY): Payer: Self-pay | Admitting: *Deleted

## 2015-06-23 DIAGNOSIS — Z95811 Presence of heart assist device: Secondary | ICD-10-CM

## 2015-06-23 DIAGNOSIS — I5022 Chronic systolic (congestive) heart failure: Secondary | ICD-10-CM

## 2015-06-23 LAB — CUP PACEART REMOTE DEVICE CHECK
Battery Voltage: 2.63 V
Brady Statistic RV Percent Paced: 0 %
Date Time Interrogation Session: 20170309190857
HighPow Impedance: 38 Ohm
HighPow Impedance: 48 Ohm
Implantable Lead Implant Date: 20090519
Implantable Lead Location: 753860
Implantable Lead Model: 7070
Lead Channel Impedance Value: 646 Ohm
Lead Channel Sensing Intrinsic Amplitude: 14.125 mV
Lead Channel Sensing Intrinsic Amplitude: 14.125 mV
Lead Channel Setting Pacing Amplitude: 2.5 V
Lead Channel Setting Pacing Pulse Width: 0.4 ms
Lead Channel Setting Sensing Sensitivity: 0.3 mV

## 2015-06-23 LAB — T4: T4, Total: 9.7 ug/dL (ref 4.5–12.0)

## 2015-06-24 ENCOUNTER — Encounter: Payer: Self-pay | Admitting: Cardiology

## 2015-06-28 ENCOUNTER — Ambulatory Visit (INDEPENDENT_AMBULATORY_CARE_PROVIDER_SITE_OTHER): Payer: Medicare Other | Admitting: *Deleted

## 2015-06-28 ENCOUNTER — Ambulatory Visit (HOSPITAL_COMMUNITY): Payer: Self-pay | Admitting: Unknown Physician Specialty

## 2015-06-28 ENCOUNTER — Other Ambulatory Visit: Payer: Self-pay | Admitting: *Deleted

## 2015-06-28 DIAGNOSIS — I428 Other cardiomyopathies: Secondary | ICD-10-CM

## 2015-06-28 DIAGNOSIS — I519 Heart disease, unspecified: Secondary | ICD-10-CM

## 2015-06-28 DIAGNOSIS — Z95811 Presence of heart assist device: Secondary | ICD-10-CM | POA: Diagnosis not present

## 2015-06-28 DIAGNOSIS — Z5181 Encounter for therapeutic drug level monitoring: Secondary | ICD-10-CM

## 2015-06-28 DIAGNOSIS — Z95810 Presence of automatic (implantable) cardiac defibrillator: Secondary | ICD-10-CM

## 2015-06-28 LAB — POCT INR: INR: 2.2

## 2015-07-05 ENCOUNTER — Encounter (HOSPITAL_COMMUNITY): Admission: RE | Admit: 2015-07-05 | Payer: Medicare Other | Source: Ambulatory Visit

## 2015-07-06 ENCOUNTER — Ambulatory Visit (HOSPITAL_COMMUNITY): Payer: Self-pay | Admitting: Unknown Physician Specialty

## 2015-07-06 ENCOUNTER — Other Ambulatory Visit (HOSPITAL_COMMUNITY): Payer: Self-pay | Admitting: Unknown Physician Specialty

## 2015-07-06 ENCOUNTER — Ambulatory Visit (HOSPITAL_COMMUNITY)
Admission: RE | Admit: 2015-07-06 | Discharge: 2015-07-06 | Disposition: A | Discharge status: Home / Self Care | Payer: Medicare Other | Source: Ambulatory Visit | Attending: Internal Medicine | Admitting: Internal Medicine

## 2015-07-06 VITALS — BP 90/0 | HR 83 | Ht 71.0 in | Wt 166.2 lb

## 2015-07-06 DIAGNOSIS — Z7901 Long term (current) use of anticoagulants: Secondary | ICD-10-CM

## 2015-07-06 DIAGNOSIS — I428 Other cardiomyopathies: Secondary | ICD-10-CM | POA: Insufficient documentation

## 2015-07-06 DIAGNOSIS — I482 Chronic atrial fibrillation: Secondary | ICD-10-CM | POA: Diagnosis not present

## 2015-07-06 DIAGNOSIS — Z8249 Family history of ischemic heart disease and other diseases of the circulatory system: Secondary | ICD-10-CM | POA: Diagnosis not present

## 2015-07-06 DIAGNOSIS — Z79899 Other long term (current) drug therapy: Secondary | ICD-10-CM | POA: Diagnosis not present

## 2015-07-06 DIAGNOSIS — F419 Anxiety disorder, unspecified: Secondary | ICD-10-CM

## 2015-07-06 DIAGNOSIS — Z7982 Long term (current) use of aspirin: Secondary | ICD-10-CM | POA: Diagnosis not present

## 2015-07-06 DIAGNOSIS — I480 Paroxysmal atrial fibrillation: Secondary | ICD-10-CM

## 2015-07-06 DIAGNOSIS — I5022 Chronic systolic (congestive) heart failure: Secondary | ICD-10-CM | POA: Diagnosis not present

## 2015-07-06 DIAGNOSIS — Z95811 Presence of heart assist device: Secondary | ICD-10-CM | POA: Diagnosis not present

## 2015-07-06 DIAGNOSIS — I4892 Unspecified atrial flutter: Secondary | ICD-10-CM | POA: Diagnosis not present

## 2015-07-06 DIAGNOSIS — Z87891 Personal history of nicotine dependence: Secondary | ICD-10-CM | POA: Diagnosis not present

## 2015-07-06 LAB — CBC
HCT: 34.3 % — ABNORMAL LOW (ref 39.0–52.0)
Hemoglobin: 11.6 g/dL — ABNORMAL LOW (ref 13.0–17.0)
MCH: 32.9 pg (ref 26.0–34.0)
MCHC: 33.8 g/dL (ref 30.0–36.0)
MCV: 97.2 fL (ref 78.0–100.0)
Platelets: 232 10*3/uL (ref 150–400)
RBC: 3.53 MIL/uL — ABNORMAL LOW (ref 4.22–5.81)
RDW: 13.6 % (ref 11.5–15.5)
WBC: 5 10*3/uL (ref 4.0–10.5)

## 2015-07-06 LAB — BASIC METABOLIC PANEL
Anion gap: 7 (ref 5–15)
BUN: 12 mg/dL (ref 6–20)
CO2: 28 mmol/L (ref 22–32)
Calcium: 9.2 mg/dL (ref 8.9–10.3)
Chloride: 106 mmol/L (ref 101–111)
Creatinine, Ser: 0.87 mg/dL (ref 0.61–1.24)
GFR calc Af Amer: >60 mL/min (ref >60)
GFR calc non Af Amer: >60 mL/min (ref >60)
Glucose, Bld: 78 mg/dL (ref 65–99)
Potassium: 3.9 mmol/L (ref 3.5–5.1)
Sodium: 141 mmol/L (ref 135–145)

## 2015-07-06 LAB — PROTIME-INR
INR: 2.15 — ABNORMAL HIGH (ref 0.00–1.49)
Prothrombin Time: 23.8 seconds — ABNORMAL HIGH (ref 11.6–15.2)

## 2015-07-06 LAB — LACTATE DEHYDROGENASE: LDH: 196 U/L — ABNORMAL HIGH (ref 98–192)

## 2015-07-06 MED ORDER — WARFARIN SODIUM 2 MG PO TABS: ORAL_TABLET | ORAL | Status: DC

## 2015-07-06 MED ORDER — LORAZEPAM 0.5 MG PO TABS: 0.5000 mg | ORAL_TABLET | Freq: Every day | ORAL | Status: DC

## 2015-07-06 MED FILL — SILDENAFIL 20 MG TABLET: 20 | 15 days supply | Qty: 90 | Fill #2

## 2015-07-06 NOTE — Patient Instructions (Signed)
1. No med changes. 2. INR is 2.1, daily dose of 4mg  Coumadin. 3. Return in 2 weeks. 4. Dressing change weekly.

## 2015-07-06 NOTE — Progress Notes (Signed)
Patient ID: Patrick Humphrey, male   DOB: Sep 22, 1965, 50 y.o.   MRN: [id number]    VAD CLINIC NOTE PCP: Dr Manuella Ghazi [facility]) Cardiology:  Dr. Gwenlyn Found HF Cardiology: Dr. Aundra Dubin  HPI: 50 yo with h/o systolic HF due to nonischemic cardiomyopathy EF 20-25% now s/p HM II LVAD on 05/10/15. Has Medtronic ICD.  He was admitted in 1/17 with acute on chronic systolic CHF and was found to be in cardiogenic shock.  Coronary angiography showed no significant CAD.  IABP was placed.  He underwent Heartmate II LVAD placement on 05/10/15.  Post-op course was complicated by atrial flutter, then atrial fibrillation.  He required TEE-guided DCCV.  He had RV failure and was on milrinone for a prolonged time but was able to titrate off and onto Revatio.   He returns for followup.  He is doing very well symptomatically.  He denies significant exertional dyspnea. Going to gym (refuses CR). Weight stable. No orthopnea, PND or edema. Occasional left rib ain.  No fevers, chills, bleeding. Feels great. ICD nearing EOL .  Denies LVAD alarms.  Denies driveline trauma, erythema or drainage.  Denies ICD shocks.   Reports taking Coumadin as prescribed and adherence to anticoagulation based dietary restrictions.  Denies bright red blood per rectum or melena, no dark urine or hematuria.  Still with about 20 PI events/day (somewhat less than prior).    VAD interrogation & Equipment Management (reviewed with Dr Aundra Dubin): Speed: 9200 Flow: 5.3 Power: 5.6 PI: 7  Alarms: none Events: 8-12 events daily   Fixed speed: 9200 Low speed limit: 8600  Primary Controller: Replace back up battery in 30 months. Back up controller: Replace back up battery in 30 months.  Labs (2/17): BNP 246, K 3.8, creatinine 0.72, AST 85, tbili 3, LDH 260, hemoglobin 10.3 Labs (3/17): K 4.2, creatinine 0.73, HCT 31.1, TSH 7.21 (elevated)  Past Medical History  Diagnosis Date  . Nonischemic cardiomyopathy ([place])   . ICD (implantable  cardioverter-defibrillator), single, in situ 08/26/2007  . Hx of echocardiogram 11/09/1910    Showed an EF of 25% with no significant valve disease.  Marland Kitchen AICD (automatic cardioverter/defibrillator) present   . Presence of permanent cardiac pacemaker   . CHF (congestive heart failure) ([place])    Social History   Social History  . Marital Status: Married    Spouse Name: N/A  . Number of Children: N/A  . Years of Education: N/A   Occupational History  . Not on file.   Social History Main Topics  . Smoking status: Former Smoker    Quit date: 02/20/1998  . Smokeless tobacco: Not on file  . Alcohol Use: 0.0 oz/week    0 Standard drinks or equivalent per week     Comment: has quit drinking x 1 1/2 months. He sporadically drinks  . Drug Use: No  . Sexual Activity: Yes   Other Topics Concern  . Not on file   Social History Narrative   Family History  Problem Relation Age of Onset  . Hypertension Mother   . Hypertension Father   . Cancer - Prostate Maternal Grandfather     Current Outpatient Prescriptions  Medication Sig Dispense Refill  . amiodarone (PACERONE) 200 MG tablet Take 1 tablet (200 mg total) by mouth daily. 90 tablet 3  . aspirin EC 81 MG tablet Take 81 mg by mouth daily.    . cetirizine (ZYRTEC) 10 MG tablet Take 10 mg by mouth daily.    . digoxin (LANOXIN) 0.125 MG  tablet Take 1 tablet (0.125 mg total) by mouth daily. 90 tablet 3  . docusate sodium (COLACE) 100 MG capsule Take 2 capsules (200 mg total) by mouth daily as needed for mild constipation. 10 capsule 6  . LORazepam (ATIVAN) 0.5 MG tablet Take 1 tablet (0.5 mg total) by mouth at bedtime. 30 tablet 3  . losartan (COZAAR) 25 MG tablet Take 0.5 tablets (12.5 mg total) by mouth daily. 90 tablet 3  . montelukast (SINGULAIR) 10 MG tablet Take 1 tablet (10 mg total) by mouth at bedtime. 90 tablet 3  . ranitidine (ZANTAC) 150 MG capsule Take 150 mg by mouth every evening.    . sildenafil (REVATIO) 20 MG tablet Take  2 tablets (40 mg total) by mouth 3 (three) times daily. 90 tablet 6  . spironolactone (ALDACTONE) 25 MG tablet Take 1 tablet (25 mg total) by mouth daily. 90 tablet 3  . Coenzyme Q10 (COQ-10) 200 MG CAPS Take 1 capsule by mouth daily. Reported on 07/06/2015    . enoxaparin (LOVENOX) 40 MG/0.4ML injection Inject 0.4 mLs (40 mg total) into the skin every 12 (twelve) hours. (Patient not taking: Reported on 06/22/2015) 10 Syringe 1  . furosemide (LASIX) 20 MG tablet Take 2 tablets (40 mg total) by mouth every other day. My take 40 mg by mouth daily as needed for swelling. (Patient not taking: Reported on 06/14/2015) 40 tablet 3  . warfarin (COUMADIN) 2 MG tablet Take as directed 60 tablet 6   No current facility-administered medications for this encounter.    Penicillins and Sulfa antibiotics  REVIEW OF SYSTEMS: All systems negative except as listed in HPI, PMH and Problem list.   Vital Signs:  Doppler modified systolic: 88 Automatic BP: 99/63 (80) HR: 83 SPO2: 99 %  Weight: 166.2 lbs Home Weight: 160-162 lbs   BP 90/0 mmHg  Pulse 83  Ht 5\' 11"  (1.803 m)  Wt 166 lb 3.2 oz (75.388 kg)  BMI 23.19 kg/m2  SpO2 99%  Physical Exam: GENERAL: Well appearing, male who presents to clinic today in no acute distress. HEENT: normal  NECK: Supple, JVP not elevated.  2+ bilaterally, no bruits.  No lymphadenopathy or thyromegaly appreciated.   CARDIAC:  Mechanical heart sounds with LVAD hum present.  LUNGS:  Clear to auscultation bilaterally.  ABDOMEN:  Soft,  nontender, positive bowel sounds x4.     LVAD exit site: well-healed and incorporated.  Dressing dry and intact.  No erythema or drainage.  Stabilization device present and accurately applied.  Driveline dressing is being changed daily per sterile technique. EXTREMITIES:  Warm and dry, no cyanosis, clubbing, rash or edema  NEUROLOGIC:  Alert and oriented x 4.  Gait steady.  No aphasia.  No dysarthria.  Affect pleasant.     ASSESSMENT  AND PLAN:   1. Status post Heartmate II LVAD implantation 131/17:  Patient comes to clinic today for routine follow up visit. Currently has NYHA class 1 symptoms on LVAD support. In hospital, he had RV failure.  MAP stable.  Multiple PI events at last visit and ramp study done and decreased speed down to 9200. Many fewer PI events today - He will stay off Lasix.   - Continue digoxin for RV support, follow levels.  - Continue Revatio 40 mg tid for RV support.  - Exercising on his own. Refuses cardiac rehab - Will need transplant referral to [facility] in a few months.  2.  Anticoagulation management: INR goal 2.0-2.5.  He will also continue  ASA 81 daily.  No evidence for overt bleeding.  - INR therapeutic today.  3. Chronic systolic CHF: Nonischemic cardiomyopathy.   - Continue losartan 12.5 mg daily, spironolactone 25 daily, and digoxin.  4. Atrial fibrillation/flutter: He remains in NSR today.   - Continue amiodarone 200 mg daily.  LFTs stable recently.  TSH elevated, will need to repeat TSH with free T3 and free T4.  He will need regular eye exams.  - Continue warfarin.  5. RV failure  - improved 6. ICD at EOL - discussed risk/benefts of gen change versus letting the battery die. Will not pursue gen change at this time   Glori Bickers MD 07/06/2015

## 2015-07-06 NOTE — Progress Notes (Signed)
Symptom Yes No Details  Angina  x Activity:  Claudication  x How far:  Syncope  x When:  Stroke  x   Orthopnea  x How many pillows:  PND  x How often:  CPAP  n/a How many hrs:  Pedal edema  x   Abd fullness  x   N&V  x   Diaphoresis  x When:  Bleeding  x   Urine  x Clear yellow  SOB  x Activity:  Palpitations  x When:  ICD shock  x   Hospitlizaitons  x When/where/why:   ED visit  x When/where/why:  Other MD  x When/who/why:  Activity  x Gym 3x week.   Fluid   < 2000  Diet   Low sodium   Vital Signs:  Doppler modified systolic:  88 Automatic BP:  99/63 (80) HR: 83 SPO2:  99 %  Weight: 166.2 lbs Home Weight: 160-162 lbs   VAD interrogation & Equipment Management (reviewed with Dr Aundra Dubin): Speed: 9200 Flow: 5.3 Power:  5.6 PI:  7  Alarms: none Events:  8-12 events daily   Fixed speed: 9200 Low speed limit: 8600  Primary Controller:  Replace back up battery in 30 months. Back up controller:   Replace back up battery in 30 months.  I reviewed the LVAD parameters from today and compared the results to the patient's prior recorded data.  LVAD interrogation was NEGATIVE for significant power changes, NEGATIVE for clinical alarms and POSITIVE for PI events/speed drops. No programming changes were made and pump is functioning within specified parameters. Will add ramp echo to next clinic visit to ascertain correct speed.   LVAD equipment check completed and is in good working order. Back-up equipment present. LVAD education done on emergency procedures and precautions and reviewed exit site care.   VAD coordinator reviewed daily log from home for daily temperature, weight, and VAD parameters. Pt is performing daily controller and system monitor self tests along with completing weekly and monthly maintenance for LVAD equipment.    Exit Site Care: Drive line is being maintained by his wife [name] daily. Redness remains the same, decreased amount of drainage. Drive line  exit site cleaned with Chlora prep applicators x 2, allowed to dry, and gauze dressing with aquacel silver strip on exit site applied. Exit site healing, the velour is fully implanted at exit site; no tenderness, drainage, or foul odor noted. Drive line anchor re-applied. Pt denies fever or chills. Driveline dressing is being changed daily per sterile technique.  Advance to weekly dressing changes and will add back aquacel silver strip on exit site, if redness increases, will stop using silver again.   If drainage increases, wife knows to go back to daily dressing changes.  Adequate dressing supplies provided for home use.        Encounter Details:   INR 2.15 today. Anticoagulation management includes INR goal of 2 - 2.5 with 81 mg. See anticoagulation flow sheet. Will continue Coumadin 4 mg daily.   LDH stable at 196 with established baseline of 250 -300. Denies tea-colored urine.   MAP 88 with auto cuff BP 99/63 (80).    Pt reported beeping from ICD every morning and every evening. Dr. Haroldine Laws spoke with the pt about device being at [facility].  Patient Instructions:  1. No med changes.  2.  Continue Coumadin 4mg  daily, recheck INR in 2 weeks at clinic visit.  3.  Return to [place] clinic in 2 weeks. 5.  Advance  dressing changes to weekly.  Tanda Rockers, RN VAD Coordinator  Office: [phone] 24/7 VAD Pager: [phone]  Robyne Peers 07/06/2015

## 2015-07-20 ENCOUNTER — Encounter: Payer: Self-pay | Admitting: Licensed Clinical Social Worker

## 2015-07-20 ENCOUNTER — Ambulatory Visit (HOSPITAL_COMMUNITY): Payer: Self-pay | Admitting: *Deleted

## 2015-07-20 ENCOUNTER — Other Ambulatory Visit (HOSPITAL_COMMUNITY): Payer: Self-pay | Admitting: *Deleted

## 2015-07-20 ENCOUNTER — Ambulatory Visit (HOSPITAL_COMMUNITY)
Admission: RE | Admit: 2015-07-20 | Discharge: 2015-07-20 | Disposition: A | Discharge status: Home / Self Care | Payer: Medicare Other | Source: Ambulatory Visit | Attending: Internal Medicine | Admitting: Internal Medicine

## 2015-07-20 VITALS — BP 104/0 | HR 85 | Ht 71.0 in | Wt 169.4 lb

## 2015-07-20 DIAGNOSIS — Z7901 Long term (current) use of anticoagulants: Secondary | ICD-10-CM | POA: Insufficient documentation

## 2015-07-20 DIAGNOSIS — I428 Other cardiomyopathies: Secondary | ICD-10-CM | POA: Insufficient documentation

## 2015-07-20 DIAGNOSIS — Z79899 Other long term (current) drug therapy: Secondary | ICD-10-CM

## 2015-07-20 DIAGNOSIS — Z7982 Long term (current) use of aspirin: Secondary | ICD-10-CM | POA: Insufficient documentation

## 2015-07-20 DIAGNOSIS — I4892 Unspecified atrial flutter: Secondary | ICD-10-CM | POA: Diagnosis not present

## 2015-07-20 DIAGNOSIS — Z8249 Family history of ischemic heart disease and other diseases of the circulatory system: Secondary | ICD-10-CM | POA: Insufficient documentation

## 2015-07-20 DIAGNOSIS — Z95811 Presence of heart assist device: Secondary | ICD-10-CM | POA: Diagnosis present

## 2015-07-20 DIAGNOSIS — Z87891 Personal history of nicotine dependence: Secondary | ICD-10-CM | POA: Diagnosis not present

## 2015-07-20 DIAGNOSIS — I519 Heart disease, unspecified: Secondary | ICD-10-CM

## 2015-07-20 DIAGNOSIS — I509 Heart failure, unspecified: Secondary | ICD-10-CM | POA: Diagnosis not present

## 2015-07-20 DIAGNOSIS — I110 Hypertensive heart disease with heart failure: Secondary | ICD-10-CM | POA: Diagnosis not present

## 2015-07-20 DIAGNOSIS — I4891 Unspecified atrial fibrillation: Secondary | ICD-10-CM | POA: Diagnosis not present

## 2015-07-20 DIAGNOSIS — I50810 Right heart failure, unspecified: Secondary | ICD-10-CM

## 2015-07-20 DIAGNOSIS — Z95810 Presence of automatic (implantable) cardiac defibrillator: Secondary | ICD-10-CM

## 2015-07-20 DIAGNOSIS — I429 Cardiomyopathy, unspecified: Secondary | ICD-10-CM

## 2015-07-20 DIAGNOSIS — I5022 Chronic systolic (congestive) heart failure: Secondary | ICD-10-CM | POA: Insufficient documentation

## 2015-07-20 LAB — CBC
HCT: 32.6 % — ABNORMAL LOW (ref 39.0–52.0)
Hemoglobin: 11.1 g/dL — ABNORMAL LOW (ref 13.0–17.0)
MCH: 32.2 pg (ref 26.0–34.0)
MCHC: 34 g/dL (ref 30.0–36.0)
MCV: 94.5 fL (ref 78.0–100.0)
Platelets: 206 10*3/uL (ref 150–400)
RBC: 3.45 MIL/uL — ABNORMAL LOW (ref 4.22–5.81)
RDW: 13.2 % (ref 11.5–15.5)
WBC: 6.7 10*3/uL (ref 4.0–10.5)

## 2015-07-20 LAB — TSH: TSH: 4.862 u[IU]/mL — ABNORMAL HIGH (ref 0.350–4.500)

## 2015-07-20 LAB — BASIC METABOLIC PANEL
Anion gap: 12 (ref 5–15)
BUN: 14 mg/dL (ref 6–20)
CO2: 23 mmol/L (ref 22–32)
Calcium: 9.1 mg/dL (ref 8.9–10.3)
Chloride: 105 mmol/L (ref 101–111)
Creatinine, Ser: 0.89 mg/dL (ref 0.61–1.24)
GFR calc Af Amer: >60 mL/min (ref >60)
GFR calc non Af Amer: >60 mL/min (ref >60)
Glucose, Bld: 95 mg/dL (ref 65–99)
Potassium: 3.8 mmol/L (ref 3.5–5.1)
Sodium: 140 mmol/L (ref 135–145)

## 2015-07-20 LAB — LACTATE DEHYDROGENASE: LDH: 187 U/L (ref 98–192)

## 2015-07-20 LAB — PROTIME-INR
INR: 1.68 — ABNORMAL HIGH (ref 0.00–1.49)
Prothrombin Time: 19.8 seconds — ABNORMAL HIGH (ref 11.6–15.2)

## 2015-07-20 LAB — T4, FREE: Free T4: 0.87 ng/dL (ref 0.61–1.12)

## 2015-07-20 MED ORDER — FUROSEMIDE 20 MG PO TABS: 20.0000 mg | ORAL_TABLET | ORAL | Status: DC | PRN

## 2015-07-20 MED ORDER — SILDENAFIL CITRATE 20 MG PO TABS: 40.0000 mg | ORAL_TABLET | Freq: Three times a day (TID) | ORAL | Status: DC

## 2015-07-20 MED FILL — *SILDENAFIL 20 MG TABLET: 20 | 30 days supply | Qty: 180 | Fill #0 | Status: TO

## 2015-07-20 NOTE — Progress Notes (Signed)
CSW met with patient in wife in the VAD clinic. 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 Both he and his wife are thrilled and appear to be adjusting well to life with a VAD. CSW discussed upcoming VAD events- support group and heart and stroke walk. Patient and wife plan to attend both events. CSW will continue to follow for support as needed. Raquel Sarna, LCSW [phone]

## 2015-07-20 NOTE — Patient Instructions (Addendum)
1.  No change in medications 2.  Will call you with INR results and coumadin dosing. 3.  Advance to weekly dressings. 4.  Return to [place] clinic in 3 weeks.

## 2015-07-20 NOTE — Progress Notes (Signed)
  Symptom Yes No Details  Angina  x Activity:  Claudication  x How far:   Syncope  x When:  Stroke  x   Orthopnea  x How many pillows:  one  PND  x How often:  CPAP  n/a How many hrs:  Pedal edema x  Clears by next morning; daily  Abd fullness  x   N&V  x Good appetite  Diaphoresis  x When:  Bleeding  x   Urine  x Clear yellow  SOB  x Activity:  Palpitations  x When:  ICD shock  x   Hospitlizaitons  x When/where/why:   ED visit  x When/where/why:  Other MD  x When/who/why:  Activity  x No limitations  Fluid   No limitations   Diet   Low sodium   Vital Signs:  Doppler modified systolic: 123456 Automatic BP:  103/80 (89) HR: 85 SPO2:  100 %  Weight: 169.4  Lbs Last wt:  166.2 lbs Home wts:  166 - 169 lbs   VAD interrogation & Equipment Management: Speed: 9200 Flow: 5.5 Power:  5.6 PI:  7.4  Alarms: none Events:  5 - 10 events daily   Fixed speed: 9200 Low speed limit: 8600  Primary Controller:  Replace back up battery in 29 months. Back up controller:   Replace back up battery in 29 months.  I reviewed the LVAD parameters from today and compared the results to the patient's prior recorded data.  LVAD interrogation was NEGATIVE for significant power changes, NEGATIVE for clinical alarms and negative for PI events/speed drops. No programming changes were made and pump is functioning within specified parameters.    LVAD equipment check completed and is in good working order. Back-up equipment present. LVAD education done on emergency procedures and precautions and reviewed exit site care.   VAD coordinator reviewed daily log from home for daily temperature, weight, and VAD parameters. Pt is performing daily controller and system monitor self tests along with completing weekly and monthly maintenance for LVAD equipment.    Exit Site Care: Drive line is being maintained by his wife [name] daily. Gauze dressing and aquacel silver strip removed. Drive line exit site  cleaned with Chlora prep applicators x 2, allowed to dry, and Sorbaview dressing with biopatch placed on exit site. Exit site with small amount redness around exit site, no tenderness, drainage, or foul odor noted; the velour is fully implanted at exit site.  Drive line anchor re-applied. Pt denies fever or chills. Driveline dressing is being changed weekly per sterile technique.  Advance to weekly dressing kits using Sorbaview dressing and continue changing weekly. Adequate dressing supplies provided for home use provided.    Encounter Details:   INR 1.68 today. Anticoagulation management includes INR goal of 2 - 2.5 with 81 mg. See anticoagulation flow sheet. Will continue Coumadin 4 mg daily.   LDH stable at 187 with established baseline of 250 -300. Denies tea-colored urine.   MAP with auto cuff BP 103/80 (89).     Patient Instructions: 1.  No change in medications 2.  Will call you with INR results and coumadin dosing. 3.  Advance to weekly dressing kits. 4.  Return to [place] clinic in 3 weeks.    Zada Girt, RN VAD Coordinator  Office: [phone] 24/7 VAD Pager: [phone]

## 2015-07-21 LAB — T3, FREE: T3, Free: 2.4 pg/mL (ref 2.0–4.4)

## 2015-07-24 NOTE — Progress Notes (Signed)
Patient ID: Patrick Humphrey, male   DOB: 10-Mar-1966, 50 y.o.   MRN: [id number]    VAD CLINIC NOTE PCP: Dr Manuella Ghazi [facility]) Cardiology:  Dr. Gwenlyn Found HF Cardiology: Dr. Aundra Dubin  HPI: 50 yo with h/o systolic HF due to nonischemic cardiomyopathy EF 20-25% now s/p HM II LVAD on 05/10/15. Has Medtronic ICD.  He was admitted in 1/17 with acute on chronic systolic CHF and was found to be in cardiogenic shock.  Coronary angiography showed no significant CAD.  IABP was placed.  He underwent Heartmate II LVAD placement on 05/10/15.  Post-op course was complicated by atrial flutter, then atrial fibrillation.  He required TEE-guided DCCV.  He had RV failure and was on milrinone for a prolonged time but was able to titrate off and onto Revatio.   He returns for followup.  Feels great. Doing anything he wants to do.. Going to gym, working outside. Weight stable. No orthopnea, PND or edema. No fevers, chills, bleeding. No need for diuretics. No bleeding.   Denies LVAD alarms.  Denies driveline trauma, erythema or drainage.  Denies ICD shocks.   Reports taking Coumadin as prescribed and adherence to anticoagulation based dietary restrictions.  Denies bright red blood per rectum or melena, no dark urine or hematuria.  Still with about 20 PI events/day (somewhat less than prior).      VAD interrogation & Equipment Management: Speed: 9200 Flow: 5.5 Power:  5.6 PI:  7.4  Alarms: none Events:  5 - 10 events daily   Fixed speed: 9200 Low speed limit: 8600  Labs (2/17): BNP 246, K 3.8, creatinine 0.72, AST 85, tbili 3, LDH 260, hemoglobin 10.3 Labs (3/17): K 4.2, creatinine 0.73, HCT 31.1, TSH 7.21 (elevated)  Past Medical History  Diagnosis Date  . Nonischemic cardiomyopathy ([place])   . ICD (implantable cardioverter-defibrillator), single, in situ 08/26/2007  . Hx of echocardiogram 11/09/1910    Showed an EF of 25% with no significant valve disease.  Marland Kitchen AICD (automatic cardioverter/defibrillator) present   .  Presence of permanent cardiac pacemaker   . CHF (congestive heart failure) ([place])    Social History   Social History  . Marital Status: Married    Spouse Name: N/A  . Number of Children: N/A  . Years of Education: N/A   Occupational History  . Not on file.   Social History Main Topics  . Smoking status: Former Smoker    Quit date: 02/20/1998  . Smokeless tobacco: Not on file  . Alcohol Use: 0.0 oz/week    0 Standard drinks or equivalent per week     Comment: has quit drinking x 1 1/2 months. He sporadically drinks  . Drug Use: No  . Sexual Activity: Yes   Other Topics Concern  . Not on file   Social History Narrative   Family History  Problem Relation Age of Onset  . Hypertension Mother   . Hypertension Father   . Cancer - Prostate Maternal Grandfather     Current Outpatient Prescriptions  Medication Sig Dispense Refill  . amiodarone (PACERONE) 200 MG tablet Take 1 tablet (200 mg total) by mouth daily. 90 tablet 3  . aspirin EC 81 MG tablet Take 81 mg by mouth daily.    . cetirizine (ZYRTEC) 10 MG tablet Take 10 mg by mouth daily.    . digoxin (LANOXIN) 0.125 MG tablet Take 1 tablet (0.125 mg total) by mouth daily. 90 tablet 3  . docusate sodium (COLACE) 100 MG capsule Take 2 capsules (200  mg total) by mouth daily as needed for mild constipation. 10 capsule 6  . LORazepam (ATIVAN) 0.5 MG tablet Take 1 tablet (0.5 mg total) by mouth at bedtime. 30 tablet 3  . losartan (COZAAR) 25 MG tablet Take 0.5 tablets (12.5 mg total) by mouth daily. 90 tablet 3  . montelukast (SINGULAIR) 10 MG tablet Take 1 tablet (10 mg total) by mouth at bedtime. 90 tablet 3  . ranitidine (ZANTAC) 150 MG capsule Take 150 mg by mouth every evening.    . sildenafil (REVATIO) 20 MG tablet Take 2 tablets (40 mg total) by mouth 3 (three) times daily. 180 tablet 6  . spironolactone (ALDACTONE) 25 MG tablet Take 1 tablet (25 mg total) by mouth daily. 90 tablet 3  . warfarin (COUMADIN) 2 MG tablet Take  as directed 60 tablet 6  . furosemide (LASIX) 20 MG tablet Take 1 tablet (20 mg total) by mouth as needed. 40 tablet 3   No current facility-administered medications for this encounter.    Penicillins and Sulfa antibiotics  REVIEW OF SYSTEMS: All systems negative except as listed in HPI, PMH and Problem list.   Vital Signs:  Doppler modified systolic: 123456 Automatic BP:  103/80 (89) HR: 85 SPO2:  100 %  Weight: 169.4  Lbs Last wt:  166.2 lbs Home wts:  166 - 169 lbs   BP 104/0 mmHg  Pulse 85  Ht 5\' 11"  (1.803 m)  Wt 169 lb 6.4 oz (76.839 kg)  BMI 23.64 kg/m2  SpO2 100%  Physical Exam: GENERAL: Well appearing, male who presents to clinic today in no acute distress. HEENT: normal  NECK: Supple, JVP  flat  2+ bilaterally, no bruits.  No lymphadenopathy or thyromegaly appreciated.   CARDIAC:  Mechanical heart sounds with LVAD hum present.  LUNGS:  Clear to auscultation bilaterally.  ABDOMEN:  Soft,  nontender, nondistended positive bowel sounds x4.     LVAD exit site: well-healed and incorporated.  Dressing dry and intact.  No erythema or drainage.  Stabilization device present and accurately applied.  Driveline dressing is being changed daily per sterile technique. EXTREMITIES:  Warm and dry, no cyanosis, clubbing, rash or edema  NEUROLOGIC:  Alert and oriented x 4.  Gait steady.  No aphasia.  No dysarthria.  Affect pleasant.     ASSESSMENT AND PLAN:   1. Status post Heartmate II LVAD implantation 131/17:  Patient comes to clinic today for routine follow up visit. Currently has NYHA class 1 symptoms on LVAD support. In hospital, he had RV failure.  MAP stable.  Multiple PI events and ramp study done and decreased speed down to 9200. Many fewer PI events. - He will stay off Lasix.   - Continue digoxin for RV support, follow levels.  - Continue Revatio 40 mg tid for RV support.  - Exercising on his own.  - Will need transplant referral to [facility] in a few months.  2.   Anticoagulation management: INR goal 2.0-2.5.  He will also continue ASA 81 daily.  No evidence for overt bleeding.  - INR 1.6 today. Warfarin adjusted with pharmacy help/  3. Chronic systolic CHF: Nonischemic cardiomyopathy.   - Continue losartan 12.5 mg daily, spironolactone 25 daily, and digoxin. May be able to stop digoxin at next visit.  4. Atrial fibrillation/flutter: He remains in NSR today.   - Continue amiodarone 200 mg daily.  LFTs stable recently.  TSH elevated, will need to repeat TSH with free T3 and free T4.  He  will need regular eye exams.  - Continue warfarin.  5. RV failure  - improved 6. ICD at EOL - discussed risk/benefts of gen change versus letting the battery die. Will not pursue gen change at this time 7. HTN - controlled.   Glori Bickers MD 07/24/2015

## 2015-07-26 ENCOUNTER — Other Ambulatory Visit (HOSPITAL_COMMUNITY)
Admission: RE | Admit: 2015-07-26 | Discharge: 2015-07-26 | Disposition: A | Discharge status: Home / Self Care | Payer: Medicare Other | Source: Ambulatory Visit | Attending: Student | Admitting: Student

## 2015-07-26 ENCOUNTER — Other Ambulatory Visit (HOSPITAL_COMMUNITY): Payer: Self-pay | Admitting: Unknown Physician Specialty

## 2015-07-26 ENCOUNTER — Telehealth (HOSPITAL_COMMUNITY): Payer: Self-pay | Admitting: Unknown Physician Specialty

## 2015-07-26 ENCOUNTER — Ambulatory Visit (INDEPENDENT_AMBULATORY_CARE_PROVIDER_SITE_OTHER): Payer: Medicare Other | Admitting: *Deleted

## 2015-07-26 ENCOUNTER — Ambulatory Visit (HOSPITAL_COMMUNITY): Payer: Self-pay | Admitting: Unknown Physician Specialty

## 2015-07-26 DIAGNOSIS — I5022 Chronic systolic (congestive) heart failure: Secondary | ICD-10-CM | POA: Insufficient documentation

## 2015-07-26 DIAGNOSIS — Z95811 Presence of heart assist device: Secondary | ICD-10-CM | POA: Insufficient documentation

## 2015-07-26 DIAGNOSIS — Z7901 Long term (current) use of anticoagulants: Secondary | ICD-10-CM

## 2015-07-26 LAB — POCT INR: INR: 2

## 2015-07-26 LAB — CBC
HCT: 34 % — ABNORMAL LOW (ref 39.0–52.0)
Hemoglobin: 11.5 g/dL — ABNORMAL LOW (ref 13.0–17.0)
MCH: 32 pg (ref 26.0–34.0)
MCHC: 33.8 g/dL (ref 30.0–36.0)
MCV: 94.7 fL (ref 78.0–100.0)
Platelets: 228 10*3/uL (ref 150–400)
RBC: 3.59 MIL/uL — ABNORMAL LOW (ref 4.22–5.81)
RDW: 12.9 % (ref 11.5–15.5)
WBC: 6.6 10*3/uL (ref 4.0–10.5)

## 2015-07-26 LAB — LACTATE DEHYDROGENASE: LDH: 203 U/L — ABNORMAL HIGH (ref 98–192)

## 2015-07-26 NOTE — Telephone Encounter (Signed)
Pt called the office this morning with c/o blood in his stool and nose bleeds. Pts INR is therapeutic today at 2.0-see anti coag note. [facility] and LDH. Hgb is stable at 11.5 and LDH is stable at 203. Pt was updated with lab results. Pt was instructed to call if blood increases or he has changes in VAD parameters or if he has any weakness, dizziness or any other concerns. Pt verbalized understanding of all instructions given.

## 2015-08-01 ENCOUNTER — Telehealth (HOSPITAL_COMMUNITY): Payer: Self-pay | Admitting: *Deleted

## 2015-08-01 NOTE — Telephone Encounter (Signed)
Pt called VAD office and left message to report he was at his PCP's office, diagnosed with sinus infection, and wanted to know if he could take steroid shot for same. Returned call to pt (had already left office) per Dr. Aundra Dubin and informed him not to take steroid; pt reports he did not. Pt was started on Cipro 500 mg twice daily for infection. He is scheduled for local INR tomorrow, will review results and initiation of Cipro with PharmD and contact pt with coumadin dosing. Pt verbalized understanding of same.

## 2015-08-02 ENCOUNTER — Ambulatory Visit (INDEPENDENT_AMBULATORY_CARE_PROVIDER_SITE_OTHER): Payer: Medicare Other | Admitting: *Deleted

## 2015-08-02 ENCOUNTER — Ambulatory Visit (HOSPITAL_COMMUNITY): Payer: Self-pay | Admitting: *Deleted

## 2015-08-02 DIAGNOSIS — Z95811 Presence of heart assist device: Secondary | ICD-10-CM

## 2015-08-02 LAB — POCT INR
INR: 4.4
INR: 4.4

## 2015-08-08 ENCOUNTER — Other Ambulatory Visit (HOSPITAL_COMMUNITY): Payer: Self-pay | Admitting: *Deleted

## 2015-08-08 DIAGNOSIS — Z7901 Long term (current) use of anticoagulants: Secondary | ICD-10-CM

## 2015-08-08 DIAGNOSIS — Z95811 Presence of heart assist device: Secondary | ICD-10-CM

## 2015-08-08 MED ORDER — WARFARIN SODIUM 2 MG PO TABS: ORAL_TABLET | ORAL | Status: DC

## 2015-08-09 ENCOUNTER — Ambulatory Visit (HOSPITAL_COMMUNITY): Payer: Self-pay | Admitting: *Deleted

## 2015-08-09 ENCOUNTER — Ambulatory Visit (INDEPENDENT_AMBULATORY_CARE_PROVIDER_SITE_OTHER): Payer: Medicare Other | Admitting: *Deleted

## 2015-08-09 DIAGNOSIS — Z95811 Presence of heart assist device: Secondary | ICD-10-CM | POA: Diagnosis not present

## 2015-08-09 LAB — POCT INR
INR: 2.1
INR: 2.1

## 2015-08-10 ENCOUNTER — Other Ambulatory Visit (HOSPITAL_COMMUNITY): Payer: Self-pay | Admitting: *Deleted

## 2015-08-10 ENCOUNTER — Ambulatory Visit (HOSPITAL_COMMUNITY)
Admission: RE | Admit: 2015-08-10 | Discharge: 2015-08-10 | Disposition: A | Discharge status: Home / Self Care | Payer: Medicare Other | Source: Ambulatory Visit | Attending: Cardiology | Admitting: Cardiology

## 2015-08-10 VITALS — BP 100/0 | HR 82 | Ht 71.0 in | Wt 171.4 lb

## 2015-08-10 DIAGNOSIS — I4891 Unspecified atrial fibrillation: Secondary | ICD-10-CM | POA: Insufficient documentation

## 2015-08-10 DIAGNOSIS — Z7901 Long term (current) use of anticoagulants: Secondary | ICD-10-CM

## 2015-08-10 DIAGNOSIS — Z8249 Family history of ischemic heart disease and other diseases of the circulatory system: Secondary | ICD-10-CM | POA: Diagnosis not present

## 2015-08-10 DIAGNOSIS — Z95811 Presence of heart assist device: Secondary | ICD-10-CM | POA: Diagnosis not present

## 2015-08-10 DIAGNOSIS — I428 Other cardiomyopathies: Secondary | ICD-10-CM | POA: Diagnosis not present

## 2015-08-10 DIAGNOSIS — Z87891 Personal history of nicotine dependence: Secondary | ICD-10-CM | POA: Insufficient documentation

## 2015-08-10 DIAGNOSIS — I110 Hypertensive heart disease with heart failure: Secondary | ICD-10-CM | POA: Diagnosis not present

## 2015-08-10 DIAGNOSIS — I5022 Chronic systolic (congestive) heart failure: Secondary | ICD-10-CM | POA: Insufficient documentation

## 2015-08-10 DIAGNOSIS — I429 Cardiomyopathy, unspecified: Secondary | ICD-10-CM

## 2015-08-10 DIAGNOSIS — I480 Paroxysmal atrial fibrillation: Secondary | ICD-10-CM

## 2015-08-10 DIAGNOSIS — Z7982 Long term (current) use of aspirin: Secondary | ICD-10-CM | POA: Diagnosis not present

## 2015-08-10 DIAGNOSIS — Z515 Encounter for palliative care: Secondary | ICD-10-CM

## 2015-08-10 DIAGNOSIS — I481 Persistent atrial fibrillation: Secondary | ICD-10-CM

## 2015-08-10 DIAGNOSIS — D62 Acute posthemorrhagic anemia: Secondary | ICD-10-CM | POA: Diagnosis not present

## 2015-08-10 DIAGNOSIS — I4892 Unspecified atrial flutter: Secondary | ICD-10-CM | POA: Diagnosis not present

## 2015-08-10 DIAGNOSIS — Z95810 Presence of automatic (implantable) cardiac defibrillator: Secondary | ICD-10-CM

## 2015-08-10 DIAGNOSIS — I4819 Other persistent atrial fibrillation: Secondary | ICD-10-CM

## 2015-08-10 DIAGNOSIS — R748 Abnormal levels of other serum enzymes: Secondary | ICD-10-CM

## 2015-08-10 DIAGNOSIS — I5023 Acute on chronic systolic (congestive) heart failure: Secondary | ICD-10-CM

## 2015-08-10 DIAGNOSIS — R570 Cardiogenic shock: Secondary | ICD-10-CM

## 2015-08-10 LAB — COMPREHENSIVE METABOLIC PANEL
ALT: 18 U/L (ref 17–63)
AST: 23 U/L (ref 15–41)
Albumin: 3.2 g/dL — ABNORMAL LOW (ref 3.5–5.0)
Alkaline Phosphatase: 64 U/L (ref 38–126)
Anion gap: 7 (ref 5–15)
BUN: 13 mg/dL (ref 6–20)
CO2: 24 mmol/L (ref 22–32)
Calcium: 8.8 mg/dL — ABNORMAL LOW (ref 8.9–10.3)
Chloride: 108 mmol/L (ref 101–111)
Creatinine, Ser: 0.96 mg/dL (ref 0.61–1.24)
GFR calc Af Amer: >60 mL/min (ref >60)
GFR calc non Af Amer: >60 mL/min (ref >60)
Glucose, Bld: 96 mg/dL (ref 65–99)
Potassium: 3.9 mmol/L (ref 3.5–5.1)
Sodium: 139 mmol/L (ref 135–145)
Total Bilirubin: 0.7 mg/dL (ref 0.3–1.2)
Total Protein: 6.5 g/dL (ref 6.5–8.1)

## 2015-08-10 LAB — IRON AND TIBC
Iron: 74 ug/dL (ref 45–182)
Saturation Ratios: 22 % (ref 17.9–39.5)
TIBC: 332 ug/dL (ref 250–450)
UIBC: 258 ug/dL

## 2015-08-10 LAB — FERRITIN: Ferritin: 48 ng/mL (ref 24–336)

## 2015-08-10 LAB — CBC
HCT: 32 % — ABNORMAL LOW (ref 39.0–52.0)
Hemoglobin: 10.9 g/dL — ABNORMAL LOW (ref 13.0–17.0)
MCH: 30.7 pg (ref 26.0–34.0)
MCHC: 34.1 g/dL (ref 30.0–36.0)
MCV: 90.1 fL (ref 78.0–100.0)
Platelets: 285 10*3/uL (ref 150–400)
RBC: 3.55 MIL/uL — ABNORMAL LOW (ref 4.22–5.81)
RDW: 12.6 % (ref 11.5–15.5)
WBC: 6.2 10*3/uL (ref 4.0–10.5)

## 2015-08-10 LAB — LACTATE DEHYDROGENASE: LDH: 188 U/L (ref 98–192)

## 2015-08-10 MED ORDER — AMIODARONE HCL 200 MG PO TABS: 100.0000 mg | ORAL_TABLET | Freq: Every day | ORAL | Status: DC

## 2015-08-10 NOTE — Progress Notes (Addendum)
Symptom Yes No Details  Angina  x Activity:  Claudication  x How far:   Syncope  x When:  Stroke  x   Orthopnea  x How many pillows:  one  PND  x How often:  CPAP  n/a How many hrs:  Pedal edema  x   Abd fullness  x   N&V  x Good appetite  Diaphoresis  x When:  Bleeding  x   Urine  x Clear yellow  SOB  x Activity:  Palpitations  x When:  ICD shock  x   Hospitlizaitons  x When/where/why:   ED visit  x When/where/why:  Other MD  x When/who/why:  Activity  x No limitations  Fluid   No limitations   Diet   Low sodium   Vital Signs:  Doppler modified systolic: 123XX123 Automatic BP:  103/71 (78) HR: 82 SPO2:  97 %  Weight: 171.4  Lbs Last wt:  169.4 lbs    VAD interrogation & Equipment Management (Reviewed with Dr Aundra Dubin): Speed: 9200 Flow: 5.6 Power:  5.8 PI:  7.1 Alarms: none Events: 0-5 PI events daily  Fixed speed: 9200 Low speed limit: 8600  Primary Controller:  Replace back up battery in 28 months. Back up controller:   Replace back up battery in 28 months.  I reviewed the LVAD parameters from today and compared the results to the patient's prior recorded data.  LVAD interrogation was NEGATIVE for significant power changes, NEGATIVE for clinical alarms and negative for PI events/speed drops. No programming changes were made and pump is functioning within specified parameters.    LVAD equipment check completed and is in good working order. Back-up equipment present. LVAD education done on emergency procedures and precautions and reviewed exit site care.   VAD coordinator reviewed daily log from home for daily temperature, weight, and VAD parameters. Pt is performing daily controller and system monitor self tests along with completing weekly and monthly maintenance for LVAD equipment.    Exit Site Care: Drive line is being maintained by his wife [name] daily. Sorbaview dressing and biopatch removed. Drive line exit site cleaned with Chlora prep applicators x 2,  allowed to dry, and Sorbaview dressing with biopatch placed on exit site. Exit site with no redness, tenderness, drainage, or foul odor noted; the velour is fully implanted at exit site.  Drive line anchor re-applied. Pt denies fever or chills. Driveline dressing is being changed weekly per sterile technique. Adequate dressing supplies provided for home use.    Encounter Details:   INR 2.1 yesterday. Anticoagulation management includes INR goal of 2 - 2.5 with 81 mg. Med changes including completion of Cipro and decreasing Amiodarone discussed with PharmD. See anticoagulation flow sheet.   LDH stable at 188 with established baseline of 250 -300. Denies tea-colored urine.   Hgb 11.1--->10.9 - iron panel obtained today.  Encounter Details: Pt presents to 3 week f/u with wife.  Pt is push mowing front yard, trimming bushes, took grandchildren to zoo last week. Denies any heart failure symptoms, states energy level continues to improve.    Completed 7 day course of Cipro on 08/07/15 for sinus infection; prescribed by PCP.    EKG today revealed sinus rhythm; will decrease amiodarone to 100 mg daily per Dr. Aundra Dubin. Thyroid panel from last visit reviewed by Dr. Aundra Dubin, CMET obtained today. Will consider stopping Amiodarone in future if pt remains in SR; was placed on amiodarone for Afib.   3 mo. Intermacs follow up completed  including:  Quality of Life, KCCQ-12, and Neurocognitive trail making.   Pt completed 1460 feet during 6 minute walk.  Patient Instructions: 1.  Stop Digoxin 2.  Decrease Amiodarone to 100 mg daily. 3.  Increase coumadin to 4 mg daily; re-check INR next Tuesday. 4.  Return to [place] [facility] in one month.  Zada Girt, RN VAD Coordinator  Office: [phone] 24/7 VAD Pager: [phone]  Patient ID: Patrick Humphrey, male   DOB: 15-Feb-1966, 50 y.o.   MRN: [id number]    VAD CLINIC NOTE PCP: Dr Manuella Ghazi [facility]) Cardiology:  Dr. Gwenlyn Found HF Cardiology: Dr. Aundra Dubin  HPI: 50 yo with h/o  systolic HF due to nonischemic cardiomyopathy EF 20-25% now s/p HM II LVAD on 05/10/15. Has Medtronic ICD.  He was admitted in 1/17 with acute on chronic systolic CHF and was found to be in cardiogenic shock.  Coronary angiography showed no significant CAD.  IABP was placed.  He underwent Heartmate II LVAD placement on 05/10/15.  Post-op course was complicated by atrial flutter, then atrial fibrillation.  He required TEE-guided DCCV.  He had RV failure and was on milrinone for a prolonged time but was able to titrate off and onto Revatio.   He returns for followup.  Feels great. Doing anything he wants to do. Going to gym, working outside. Able to walk around zoo with grandchildren recently.  Weight stable. No orthopnea, PND or edema. No fevers, chills, bleeding. No need for diuretics. No bleeding.   Denies LVAD alarms.  Denies driveline trauma, erythema or drainage.  Denies ICD shocks.    Reports taking Coumadin as prescribed and adherence to anticoagulation based dietary restrictions.  Denies bright red blood per rectum or melena, no dark urine or hematuria.  Only about 0-5 PI events/day.    VAD interrogation & Equipment Management: See LVAD nurse coordinator note above.  Labs (2/17): BNP 246, K 3.8, creatinine 0.72, AST 85, tbili 3, LDH 260, hemoglobin 10.3 Labs (3/17): K 4.2, creatinine 0.73, HCT 31.1, TSH 7.21 (elevated) Labs (4/17): creatinine 0.89, HCT 34, LDH 203, TSH elevated but free T3 and free T4 normal.   ECG: NSR, poor RWP  Past Medical History  Diagnosis Date  . Nonischemic cardiomyopathy ([place])   . ICD (implantable cardioverter-defibrillator), single, in situ 08/26/2007  . Hx of echocardiogram 11/09/1910    Showed an EF of 25% with no significant valve disease.  Marland Kitchen AICD (automatic cardioverter/defibrillator) present   . Presence of permanent cardiac pacemaker   . CHF (congestive heart failure) ([place])    Social History   Social History  . Marital Status: Married    Spouse  Name: N/A  . Number of Children: N/A  . Years of Education: N/A   Occupational History  . Not on file.   Social History Main Topics  . Smoking status: Former Smoker    Quit date: 02/20/1998  . Smokeless tobacco: Not on file  . Alcohol Use: 0.0 oz/week    0 Standard drinks or equivalent per week     Comment: has quit drinking x 1 1/2 months. He sporadically drinks  . Drug Use: No  . Sexual Activity: Yes   Other Topics Concern  . Not on file   Social History Narrative   Family History  Problem Relation Age of Onset  . Hypertension Mother   . Hypertension Father   . Cancer - Prostate Maternal Grandfather     Current Outpatient Prescriptions  Medication Sig Dispense Refill  . amiodarone (PACERONE) 200 MG  tablet Take 0.5 tablets (100 mg total) by mouth daily. 90 tablet 3  . aspirin EC 81 MG tablet Take 81 mg by mouth daily.    . cetirizine (ZYRTEC) 10 MG tablet Take 10 mg by mouth daily.    Marland Kitchen docusate sodium (COLACE) 100 MG capsule Take 2 capsules (200 mg total) by mouth daily as needed for mild constipation. 10 capsule 6  . LORazepam (ATIVAN) 0.5 MG tablet Take 1 tablet (0.5 mg total) by mouth at bedtime. 30 tablet 3  . losartan (COZAAR) 25 MG tablet Take 0.5 tablets (12.5 mg total) by mouth daily. 90 tablet 3  . montelukast (SINGULAIR) 10 MG tablet Take 1 tablet (10 mg total) by mouth at bedtime. 90 tablet 3  . ranitidine (ZANTAC) 150 MG capsule Take 150 mg by mouth every evening.    . sildenafil (REVATIO) 20 MG tablet Take 2 tablets (40 mg total) by mouth 3 (three) times daily. 180 tablet 6  . spironolactone (ALDACTONE) 25 MG tablet Take 1 tablet (25 mg total) by mouth daily. 90 tablet 3  . warfarin (COUMADIN) 2 MG tablet Take as directed 60 tablet 6  . furosemide (LASIX) 20 MG tablet Take 1 tablet (20 mg total) by mouth as needed. (Patient not taking: Reported on 08/10/2015) 40 tablet 3   No current facility-administered medications for this encounter.    Penicillins and  Sulfa antibiotics  REVIEW OF SYSTEMS: All systems negative except as listed in HPI, PMH and Problem list.   Vital Signs:  BP 100/0 mmHg  Pulse 82  Ht 5\' 11"  (1.803 m)  Wt 171 lb 6.4 oz (77.747 kg)  BMI 23.92 kg/m2  SpO2 97% MAP 78  Physical Exam: GENERAL: Well appearing, male who presents to clinic today in no acute distress. HEENT: normal  NECK: Supple, JVP  flat  2+ bilaterally, no bruits.  No lymphadenopathy or thyromegaly appreciated.   CARDIAC:  Mechanical heart sounds with LVAD hum present.  LUNGS:  Clear to auscultation bilaterally.  ABDOMEN:  Soft,  nontender, nondistended positive bowel sounds x4.     LVAD exit site: well-healed and incorporated.  Dressing dry and intact.  No erythema or drainage.  Stabilization device present and accurately applied.  Driveline dressing is being changed daily per sterile technique. EXTREMITIES:  Warm and dry, no cyanosis, clubbing, rash or edema  NEUROLOGIC:  Alert and oriented x 4.  Gait steady.  No aphasia.  No dysarthria.  Affect pleasant.     ASSESSMENT AND PLAN:   1. Status post Heartmate II LVAD implantation 1/17:  Patient comes to clinic today for routine follow up visit. Currently has NYHA class 1 symptoms on LVAD support. In hospital, he had RV failure.  MAP stable.  Fewer PI events than in the past.  - He will stay off Lasix.   - I think that he can stop digoxin today.   - Continue Revatio 40 mg tid for RV support.  - Exercising on his own.  - Will need transplant referral to [facility] in a few months.  2.  Anticoagulation management: INR goal 2.0-2.5.  He will also continue ASA 81 daily.  No evidence for overt bleeding.  - Check INR today.  3. Chronic systolic CHF: Nonischemic cardiomyopathy.   - Continue losartan 12.5 mg daily, spironolactone 25 daily.  4. Atrial fibrillation/flutter: He remains in NSR today.   - Decrease amiodarone to 100 mg daily.  Recent thyroid labs suggestive of subclinical hypothyroidism, will need to  follow.  Check LFTs today.  Will need regular eye exams.  - Continue warfarin.  5. RV failure: improved 6. ICD at EOL: He has decided against generator change. 7. HTN: controlled.   [name] MD 08/10/2015

## 2015-08-10 NOTE — Patient Instructions (Addendum)
1.  Stop Digoxin 2.  Decrease Amiodarone to 100 mg daily. 3.  Increase coumadin to 4 mg daily; re-check INR next Tuesday. 4.  Return to [place] clinic in one month.

## 2015-08-16 ENCOUNTER — Ambulatory Visit (HOSPITAL_COMMUNITY): Payer: Self-pay | Admitting: *Deleted

## 2015-08-16 ENCOUNTER — Ambulatory Visit (INDEPENDENT_AMBULATORY_CARE_PROVIDER_SITE_OTHER): Payer: Medicare Other | Admitting: *Deleted

## 2015-08-16 DIAGNOSIS — Z95811 Presence of heart assist device: Secondary | ICD-10-CM | POA: Diagnosis not present

## 2015-08-16 LAB — POCT INR
INR: 2
INR: 2

## 2015-09-01 ENCOUNTER — Telehealth: Payer: Self-pay | Admitting: Infectious Diseases

## 2015-09-01 NOTE — Telephone Encounter (Signed)
Called about missed INR from yesterday. Reported that he has received his home machine and that his sister's husband has been in the hospital critically ill and he has been busy helping with their needs. Will check tomorrow.

## 2015-09-02 ENCOUNTER — Ambulatory Visit (HOSPITAL_COMMUNITY): Payer: Self-pay | Admitting: Infectious Diseases

## 2015-09-02 LAB — POCT INR: INR: 2.1

## 2015-09-08 ENCOUNTER — Ambulatory Visit: Payer: Self-pay | Admitting: Infectious Diseases

## 2015-09-08 DIAGNOSIS — Z7901 Long term (current) use of anticoagulants: Secondary | ICD-10-CM

## 2015-09-08 DIAGNOSIS — Z95811 Presence of heart assist device: Secondary | ICD-10-CM

## 2015-09-08 LAB — POCT INR: INR: 1.9

## 2015-09-08 MED ORDER — WARFARIN SODIUM 2 MG PO TABS: ORAL_TABLET | ORAL | Status: DC

## 2015-09-09 ENCOUNTER — Other Ambulatory Visit (HOSPITAL_COMMUNITY): Payer: Self-pay | Admitting: Infectious Diseases

## 2015-09-09 ENCOUNTER — Telehealth (HOSPITAL_COMMUNITY): Payer: Self-pay | Admitting: Infectious Diseases

## 2015-09-09 DIAGNOSIS — Z7901 Long term (current) use of anticoagulants: Secondary | ICD-10-CM

## 2015-09-09 DIAGNOSIS — Z95811 Presence of heart assist device: Secondary | ICD-10-CM

## 2015-09-09 MED ORDER — WARFARIN SODIUM 2 MG PO TABS: ORAL_TABLET | ORAL | Status: DC

## 2015-09-09 NOTE — Telephone Encounter (Signed)
Call received from [name] re: coumadin Rx--requested to have it sent to [place] while waiting for [facility] to get new Rx. Refills added for this med in the event she needs to refill locally again.   Rx re-sent to [facility] .  Janene Madeira, RN VAD Coordinator   Office: [phone] 24/7 VAD Pager: [phone]

## 2015-09-14 ENCOUNTER — Other Ambulatory Visit (HOSPITAL_COMMUNITY): Payer: Self-pay | Admitting: Unknown Physician Specialty

## 2015-09-14 ENCOUNTER — Ambulatory Visit: Payer: Self-pay | Admitting: Infectious Diseases

## 2015-09-14 ENCOUNTER — Encounter (HOSPITAL_COMMUNITY): Payer: Medicare Other

## 2015-09-14 DIAGNOSIS — Z7901 Long term (current) use of anticoagulants: Secondary | ICD-10-CM

## 2015-09-14 DIAGNOSIS — Z95811 Presence of heart assist device: Secondary | ICD-10-CM

## 2015-09-14 LAB — POCT INR: INR: 2.1

## 2015-09-15 ENCOUNTER — Encounter: Payer: Medicare Other | Admitting: *Deleted

## 2015-09-15 ENCOUNTER — Ambulatory Visit (HOSPITAL_COMMUNITY): Payer: Self-pay | Admitting: Infectious Diseases

## 2015-09-16 ENCOUNTER — Encounter: Payer: Self-pay | Admitting: Cardiology

## 2015-09-19 ENCOUNTER — Ambulatory Visit (HOSPITAL_COMMUNITY)
Admission: RE | Admit: 2015-09-19 | Discharge: 2015-09-19 | Disposition: A | Discharge status: Home / Self Care | Payer: Medicare Other | Source: Ambulatory Visit | Attending: Internal Medicine | Admitting: Internal Medicine

## 2015-09-19 ENCOUNTER — Encounter (HOSPITAL_COMMUNITY): Payer: Self-pay

## 2015-09-19 VITALS — BP 96/76 | HR 79 | Ht 71.0 in | Wt 175.4 lb

## 2015-09-19 DIAGNOSIS — I428 Other cardiomyopathies: Secondary | ICD-10-CM | POA: Diagnosis not present

## 2015-09-19 DIAGNOSIS — Z79899 Other long term (current) drug therapy: Secondary | ICD-10-CM | POA: Diagnosis not present

## 2015-09-19 DIAGNOSIS — I4892 Unspecified atrial flutter: Secondary | ICD-10-CM | POA: Insufficient documentation

## 2015-09-19 DIAGNOSIS — I5022 Chronic systolic (congestive) heart failure: Secondary | ICD-10-CM | POA: Diagnosis not present

## 2015-09-19 DIAGNOSIS — I110 Hypertensive heart disease with heart failure: Secondary | ICD-10-CM | POA: Insufficient documentation

## 2015-09-19 DIAGNOSIS — I4891 Unspecified atrial fibrillation: Secondary | ICD-10-CM | POA: Diagnosis not present

## 2015-09-19 DIAGNOSIS — Z8249 Family history of ischemic heart disease and other diseases of the circulatory system: Secondary | ICD-10-CM | POA: Insufficient documentation

## 2015-09-19 DIAGNOSIS — Z7982 Long term (current) use of aspirin: Secondary | ICD-10-CM | POA: Diagnosis not present

## 2015-09-19 DIAGNOSIS — Z95811 Presence of heart assist device: Secondary | ICD-10-CM

## 2015-09-19 DIAGNOSIS — Z87891 Personal history of nicotine dependence: Secondary | ICD-10-CM | POA: Insufficient documentation

## 2015-09-19 DIAGNOSIS — Z7901 Long term (current) use of anticoagulants: Secondary | ICD-10-CM | POA: Diagnosis not present

## 2015-09-19 DIAGNOSIS — I480 Paroxysmal atrial fibrillation: Secondary | ICD-10-CM

## 2015-09-19 LAB — BASIC METABOLIC PANEL
Anion gap: 7 (ref 5–15)
BUN: 14 mg/dL (ref 6–20)
CO2: 23 mmol/L (ref 22–32)
Calcium: 8.9 mg/dL (ref 8.9–10.3)
Chloride: 105 mmol/L (ref 101–111)
Creatinine, Ser: 1.03 mg/dL (ref 0.61–1.24)
GFR calc Af Amer: >60 mL/min (ref >60)
GFR calc non Af Amer: >60 mL/min (ref >60)
Glucose, Bld: 96 mg/dL (ref 65–99)
Potassium: 3.7 mmol/L (ref 3.5–5.1)
Sodium: 135 mmol/L (ref 135–145)

## 2015-09-19 LAB — CBC
HCT: 34.2 % — ABNORMAL LOW (ref 39.0–52.0)
Hemoglobin: 11.2 g/dL — ABNORMAL LOW (ref 13.0–17.0)
MCH: 29 pg (ref 26.0–34.0)
MCHC: 32.7 g/dL (ref 30.0–36.0)
MCV: 88.6 fL (ref 78.0–100.0)
Platelets: 176 10*3/uL (ref 150–400)
RBC: 3.86 MIL/uL — ABNORMAL LOW (ref 4.22–5.81)
RDW: 13.5 % (ref 11.5–15.5)
WBC: 4 10*3/uL (ref 4.0–10.5)

## 2015-09-19 LAB — LACTATE DEHYDROGENASE: LDH: 174 U/L (ref 98–192)

## 2015-09-19 NOTE — Progress Notes (Signed)
Symptom Yes No Details  Angina  x Activity:  Claudication  x How far:   Syncope  x When:  Stroke  x   Orthopnea  x How many pillows:  one  PND  x How often:  CPAP  n/a How many hrs:  Pedal edema  x   Abd fullness  x   N&V  x Good appetite  Diaphoresis  x When:  Bleeding  x   Urine  x Clear yellow  SOB  x Activity:  Palpitations  x When:  ICD shock  x   Hospitlizaitons  x When/where/why:   ED visit  x When/where/why:  Other MD  x When/who/why:  Activity  x No limitations  Fluid   No limitations   Diet   Low sodium   Vital Signs:  Doppler modified systolic: 90 Automatic BP: 96/76 (85) HR: 79 SPO2:  97 %  Weight: 175.4 lb  Last wt: 171.4 lbs    VAD interrogation & Equipment Management (Reviewed with Dr Aundra Dubin): Speed: 9200 Flow: 5.5 Power: 5.7w PI:  7.0 Alarms: none Events: 0-5 PI events every 1-2 days  Fixed speed: 9200 Low speed limit: 8600  Primary Controller: Replace back up battery in 27 months. Back up controller: Replace back up battery in 28 months.  I reviewed the LVAD parameters from today and compared the results to the patient's prior recorded data.  LVAD interrogation was NEGATIVE for significant power changes, NEGATIVE for clinical alarms and negative for PI events/speed drops. No programming changes were made and pump is functioning within specified parameters.    LVAD equipment check completed and is in good working order. Back-up equipment present. LVAD education done on emergency procedures and precautions and reviewed exit site care.   VAD coordinator reviewed daily log from home for daily temperature, weight, and VAD parameters. Pt is performing daily controller and system monitor self tests along with completing weekly and monthly maintenance for LVAD equipment.    Exit Site Care: Drive line is being maintained by his wife [name] daily. Sorbaview dressing and biopatch removed. Drive line exit site cleaned with Chlora prep applicators x 2,  allowed to dry, and Sorbaview dressing with biopatch placed on exit site. Exit site with no redness, tenderness, drainage, or foul odor noted; the velour is fully implanted at exit site. Drive line anchor re-applied. Pt denies fever or chills. Driveline dressing is being changed weekly per sterile technique. Adequate dressing supplies provided for home use.   Advised that should he be sweating a lot outside could consider using the daily gauze dressing kits and change afterwards to help wick moisture better than sorbaview dressing.   Labs:  POC INR 2.1 Anticoagulation management includes INR goal of 2 - 2.5 with 81 mg. No med changes recently. See anticoagulation flow sheet.   LDH stable at 174 - with established baseline of 250 -300. Denies tea-colored urine.   Hgb 11.2 today - iron panel from last visit reviewed and WNL  Encounter Details: Pt presents to 4 week f/u with wife. Pt is push mowing front yard, trimming bushes, took grandchildren to zoo last week. Denies any heart failure symptoms, states energy level continues to improve. Has some complaints about joint stiffness. Asked if it is ok for Glucosamine - chondroitin (will review with Pharm-D) and if he can run on the treadmill.   Will reach out to surrounding VAD sites to see if there are any suggestions to assist with better securing of driveline/equipment. Suggested to use  gauze dressing to DL site to wick sweat away. Dr. Haroldine Laws approved running on treadmill in controlled, supervised environment for now to ensure he can tolerate.   RTC in 1 month to discuss heart transplant (implant date 05/10/15, blood type A+). Prefers [facility] for site.   Medication Changes: none (consider stopping Amiodarone next visit).   Janene Madeira, RN VAD Coordinator   Office: [phone] 24/7 VAD Pager: [phone]

## 2015-09-20 NOTE — Progress Notes (Signed)
Patient ID: Patrick Humphrey, male   DOB: 1965-06-13, 50 y.o.   MRN: [id number]    VAD CLINIC NOTE PCP: Dr Manuella Ghazi [facility]) Cardiology:  Dr. Gwenlyn Found HF Cardiology: Dr. Aundra Dubin  HPI: 50 yo with h/o systolic HF due to nonischemic cardiomyopathy EF 20-25% now s/p HM II LVAD on 05/10/15. Has Medtronic ICD.  He was admitted in 51/17 with acute on chronic systolic CHF and was found to be in cardiogenic shock.  Coronary angiography showed no significant CAD.  IABP was placed.  He underwent Heartmate II LVAD placement on 05/10/15.  Post-op course was complicated by atrial flutter, then atrial fibrillation.  He required TEE-guided DCCV.  He had RV failure and was on milrinone for a prolonged time but was able to titrate off and onto Revatio.   He returns for followup.  Feels great. Doing anything he wants. Going to gym, working outside. Wants to start running.  Compliant with meds. Weight stable. No orthopnea, PND or edema. No fevers, chills, bleeding. No need for diuretics. No bleeding.   Denies LVAD alarms.  Denies driveline trauma, erythema or drainage.  Denies ICD shocks.    Reports taking Coumadin as prescribed and adherence to anticoagulation based dietary restrictions.  Denies bright red blood per rectum or melena, no dark urine or hematuria.  Only about 0-5 PI events/day.    VAD interrogation & Equipment Management:  Speed: 9200 Flow: 5.5 Power: 5.7w PI: 7.0 Alarms: none Events: 0-5 PI events every 1-2 days  Fixed speed: 9200 Low speed limit: 8600  Primary Controller: Replace back up battery in 27 months. Back up controller: Replace back up battery in 28 months.  Labs (2/17): BNP 246, K 3.8, creatinine 0.72, AST 85, tbili 3, LDH 260, hemoglobin 10.3 Labs (3/17): K 4.2, creatinine 0.73, HCT 31.1, TSH 7.21 (elevated) Labs (4/17): creatinine 0.89, HCT 34, LDH 203, TSH elevated but free T3 and free T4 normal.   ECG: NSR, poor RWP  Past Medical History  Diagnosis Date  . Nonischemic  cardiomyopathy ([place])   . ICD (implantable cardioverter-defibrillator), single, in situ 08/26/2007  . Hx of echocardiogram 11/08/1949    Showed an EF of 25% with no significant valve disease.  Marland Kitchen AICD (automatic cardioverter/defibrillator) present   . Presence of permanent cardiac pacemaker   . CHF (congestive heart failure) ([place])    Social History   Social History  . Marital Status: Married    Spouse Name: N/A  . Number of Children: N/A  . Years of Education: N/A   Occupational History  . Not on file.   Social History Main Topics  . Smoking status: Former Smoker    Quit date: 02/20/1998  . Smokeless tobacco: Not on file  . Alcohol Use: 0.0 oz/week    0 Standard drinks or equivalent per week     Comment: has quit drinking x 1 1/2 months. He sporadically drinks  . Drug Use: No  . Sexual Activity: Yes   Other Topics Concern  . Not on file   Social History Narrative   Family History  Problem Relation Age of Onset  . Hypertension Mother   . Hypertension Father   . Cancer - Prostate Maternal Grandfather     Current Outpatient Prescriptions  Medication Sig Dispense Refill  . amiodarone (PACERONE) 200 MG tablet Take 0.5 tablets (100 mg total) by mouth daily. 90 tablet 3  . aspirin EC 81 MG tablet Take 81 mg by mouth daily.    . cetirizine (ZYRTEC) 10  MG tablet Take 10 mg by mouth daily.    Marland Kitchen docusate sodium (COLACE) 100 MG capsule Take 2 capsules (200 mg total) by mouth daily as needed for mild constipation. 10 capsule 6  . furosemide (LASIX) 20 MG tablet Take 1 tablet (20 mg total) by mouth as needed. (Patient not taking: Reported on 08/10/2015) 40 tablet 3  . LORazepam (ATIVAN) 0.5 MG tablet Take 1 tablet (0.5 mg total) by mouth at bedtime. 30 tablet 3  . losartan (COZAAR) 25 MG tablet Take 0.5 tablets (12.5 mg total) by mouth daily. 90 tablet 3  . montelukast (SINGULAIR) 10 MG tablet Take 1 tablet (10 mg total) by mouth at bedtime. 90 tablet 3  . ranitidine (ZANTAC) 150 MG  capsule Take 150 mg by mouth every evening.    . sildenafil (REVATIO) 20 MG tablet Take 2 tablets (40 mg total) by mouth 3 (three) times daily. 180 tablet 6  . spironolactone (ALDACTONE) 25 MG tablet Take 1 tablet (25 mg total) by mouth daily. 90 tablet 3  . warfarin (COUMADIN) 2 MG tablet Take as directed by V[facility] Clinic 80 tablet 3   No current facility-administered medications for this encounter.    Penicillins and Sulfa antibiotics  REVIEW OF SYSTEMS: All systems negative except as listed in HPI, PMH and Problem list.   Vital Signs:  BP 96/76 mmHg  Pulse 79  Ht 5\' 11"  (1.803 m)  Wt 175 lb 6.4 oz (79.561 kg)  BMI 24.47 kg/m2  SpO2 98%  Doppler modified systolic: 90 Automatic BP: 96/76 (85) HR: 79 SPO2: 97 %  Weight: 175.4 lb  Last wt: 171.4 lbs  Physical Exam: GENERAL: Well appearing, male who presents to clinic today in no acute distress. HEENT: normal  NECK: Supple, JVP  flat  2+ bilaterally, no bruits.  No lymphadenopathy or thyromegaly appreciated.   CARDIAC:  Mechanical heart sounds with LVAD hum present.  LUNGS:  Clear to auscultation bilaterally.  ABDOMEN:  Soft,  nontender, nondistended positive bowel sounds x4.     LVAD exit site: well-healed and incorporated.  Dressing dry and intact.  No erythema or drainage.  Stabilization device present and accurately applied.  Driveline dressing is being changed daily per sterile technique. EXTREMITIES:  Warm and dry, no cyanosis, clubbing, rash or edema  NEUROLOGIC:  Alert and oriented x 4.  Gait steady.  No aphasia.  No dysarthria.  Affect pleasant.     ASSESSMENT AND PLAN:   1. Status post Heartmate II LVAD implantation 1/17:  Patient comes to clinic today for routine follow up visit. Currently has NYHA class 1 symptoms on LVAD support. In hospital, he had RV failure.  MAP stable.  Fewer PI events than in the past.  - He will stay off Lasix.   - Continue Revatio 40 mg tid for RV support.  - Exercising on his own. Wants  to start running. We ok'd this. Will get him details on securement devices, etc. Knows to change dressing if it gets sweaty.  - Working on transplant referral to [facility]. Blood type A+ - VAD parameters ok. 2.  Anticoagulation management: INR goal 2.0-2.5.  He will also continue ASA 81 daily.  No evidence for overt bleeding.  - Check INR today.  3. Chronic systolic CHF: Nonischemic cardiomyopathy.   - Continue losartan 12.5 mg daily, spironolactone 25 daily.  4. Atrial fibrillation/flutter: He remains in NSR today.   - Continue amiodarone to 100 mg daily.  Recent thyroid labs suggestive of subclinical hypothyroidism,  will need to follow.  Recent LFTs (08/10/15) ok.  Will need regular eye exams.  - Continue warfarin.  5. RV failure: improved 6. ICD at EOL: He has decided against generator change. 7. HTN: controlled.   Glori Bickers MD 09/20/2015

## 2015-09-21 ENCOUNTER — Ambulatory Visit: Payer: Self-pay | Admitting: Unknown Physician Specialty

## 2015-09-21 LAB — POCT INR: INR: 2.5

## 2015-09-27 ENCOUNTER — Ambulatory Visit (HOSPITAL_COMMUNITY): Payer: Self-pay | Admitting: Infectious Diseases

## 2015-09-27 LAB — POCT INR: INR: 2.5

## 2015-10-03 ENCOUNTER — Ambulatory Visit (INDEPENDENT_AMBULATORY_CARE_PROVIDER_SITE_OTHER): Payer: Medicare Other | Admitting: *Deleted

## 2015-10-03 DIAGNOSIS — I428 Other cardiomyopathies: Secondary | ICD-10-CM

## 2015-10-03 DIAGNOSIS — I429 Cardiomyopathy, unspecified: Secondary | ICD-10-CM | POA: Diagnosis not present

## 2015-10-04 ENCOUNTER — Ambulatory Visit (HOSPITAL_COMMUNITY): Payer: Self-pay | Admitting: Unknown Physician Specialty

## 2015-10-04 LAB — POCT INR: INR: 2.3

## 2015-10-05 ENCOUNTER — Encounter: Payer: Self-pay | Admitting: Cardiology

## 2015-10-05 LAB — CUP PACEART REMOTE DEVICE CHECK
Battery Voltage: 2.62 V
Brady Statistic RV Percent Paced: 0 %
Date Time Interrogation Session: 20170627033659
HighPow Impedance: 50 Ohm
HighPow Impedance: 74 Ohm
Implantable Lead Implant Date: 20090519
Implantable Lead Location: 753860
Implantable Lead Model: 7070
Lead Channel Impedance Value: 532 Ohm
Lead Channel Sensing Intrinsic Amplitude: 7.875 mV
Lead Channel Sensing Intrinsic Amplitude: 7.875 mV
Lead Channel Setting Pacing Amplitude: 2.5 V
Lead Channel Setting Pacing Pulse Width: 0.4 ms
Lead Channel Setting Sensing Sensitivity: 0.3 mV

## 2015-10-05 NOTE — Progress Notes (Signed)
Remote ICD transmission.   

## 2015-10-10 ENCOUNTER — Telehealth (HOSPITAL_COMMUNITY): Payer: Self-pay | Admitting: Infectious Diseases

## 2015-10-10 ENCOUNTER — Other Ambulatory Visit (HOSPITAL_COMMUNITY): Payer: Self-pay | Admitting: Internal Medicine

## 2015-10-10 NOTE — Telephone Encounter (Signed)
Called re: scheduled INR for Tuesday July 4th as the [facility] is closed and we will not have a coordinator or pharmacist at the office. Instructed him to page the coordinator on call should his INR be outside his Rx'd range of 2.0 - 2.5. Verbalized understanding of the same.   Janene Madeira, RN VAD Coordinator   Office: [phone] 24/7 VAD Pager: [phone]

## 2015-10-12 ENCOUNTER — Telehealth (HOSPITAL_COMMUNITY): Payer: Self-pay | Admitting: Infectious Diseases

## 2015-10-12 ENCOUNTER — Ambulatory Visit (HOSPITAL_COMMUNITY): Payer: Self-pay | Admitting: Infectious Diseases

## 2015-10-12 LAB — POCT INR: INR: 2.3

## 2015-10-12 NOTE — Telephone Encounter (Signed)
Called re: missed INR. He actually performed test at home and reports that there were some issues recording.   INR 2.3. See anticoag note.

## 2015-10-13 NOTE — Telephone Encounter (Signed)
A user error has taken place: encounter opened in error, closed for administrative reasons.

## 2015-10-14 ENCOUNTER — Encounter (HOSPITAL_COMMUNITY): Payer: Self-pay | Admitting: Infectious Diseases

## 2015-10-18 ENCOUNTER — Ambulatory Visit (HOSPITAL_COMMUNITY): Payer: Self-pay | Admitting: Unknown Physician Specialty

## 2015-10-18 ENCOUNTER — Other Ambulatory Visit (HOSPITAL_COMMUNITY): Payer: Self-pay | Admitting: Unknown Physician Specialty

## 2015-10-18 DIAGNOSIS — Z7901 Long term (current) use of anticoagulants: Secondary | ICD-10-CM

## 2015-10-18 DIAGNOSIS — Z95811 Presence of heart assist device: Secondary | ICD-10-CM

## 2015-10-18 LAB — POCT INR: INR: 2.4

## 2015-10-19 ENCOUNTER — Ambulatory Visit (HOSPITAL_COMMUNITY)
Admission: RE | Admit: 2015-10-19 | Discharge: 2015-10-19 | Disposition: A | Discharge status: Home / Self Care | Payer: Medicare Other | Source: Ambulatory Visit | Attending: Cardiology | Admitting: Cardiology

## 2015-10-19 VITALS — BP 80/0 | HR 85 | Ht 71.0 in | Wt 179.0 lb

## 2015-10-19 DIAGNOSIS — I110 Hypertensive heart disease with heart failure: Secondary | ICD-10-CM | POA: Diagnosis not present

## 2015-10-19 DIAGNOSIS — F419 Anxiety disorder, unspecified: Secondary | ICD-10-CM

## 2015-10-19 DIAGNOSIS — I480 Paroxysmal atrial fibrillation: Secondary | ICD-10-CM

## 2015-10-19 DIAGNOSIS — I4892 Unspecified atrial flutter: Secondary | ICD-10-CM | POA: Insufficient documentation

## 2015-10-19 DIAGNOSIS — I4891 Unspecified atrial fibrillation: Secondary | ICD-10-CM | POA: Insufficient documentation

## 2015-10-19 DIAGNOSIS — Z950 Presence of cardiac pacemaker: Secondary | ICD-10-CM | POA: Diagnosis not present

## 2015-10-19 DIAGNOSIS — Z7901 Long term (current) use of anticoagulants: Secondary | ICD-10-CM | POA: Diagnosis not present

## 2015-10-19 DIAGNOSIS — I429 Cardiomyopathy, unspecified: Secondary | ICD-10-CM | POA: Insufficient documentation

## 2015-10-19 DIAGNOSIS — Z87891 Personal history of nicotine dependence: Secondary | ICD-10-CM | POA: Insufficient documentation

## 2015-10-19 DIAGNOSIS — I5022 Chronic systolic (congestive) heart failure: Secondary | ICD-10-CM

## 2015-10-19 DIAGNOSIS — R570 Cardiogenic shock: Secondary | ICD-10-CM | POA: Diagnosis not present

## 2015-10-19 DIAGNOSIS — Z95811 Presence of heart assist device: Secondary | ICD-10-CM

## 2015-10-19 DIAGNOSIS — Z7982 Long term (current) use of aspirin: Secondary | ICD-10-CM | POA: Diagnosis not present

## 2015-10-19 LAB — CBC
HCT: 36 % — ABNORMAL LOW (ref 39.0–52.0)
Hemoglobin: 11.9 g/dL — ABNORMAL LOW (ref 13.0–17.0)
MCH: 29.5 pg (ref 26.0–34.0)
MCHC: 33.1 g/dL (ref 30.0–36.0)
MCV: 89.3 fL (ref 78.0–100.0)
Platelets: 170 10*3/uL (ref 150–400)
RBC: 4.03 MIL/uL — ABNORMAL LOW (ref 4.22–5.81)
RDW: 14 % (ref 11.5–15.5)
WBC: 4.1 10*3/uL (ref 4.0–10.5)

## 2015-10-19 LAB — COMPREHENSIVE METABOLIC PANEL
ALT: 18 U/L (ref 17–63)
AST: 24 U/L (ref 15–41)
Albumin: 3.5 g/dL (ref 3.5–5.0)
Alkaline Phosphatase: 64 U/L (ref 38–126)
Anion gap: 6 (ref 5–15)
BUN: 16 mg/dL (ref 6–20)
CO2: 24 mmol/L (ref 22–32)
Calcium: 9 mg/dL (ref 8.9–10.3)
Chloride: 108 mmol/L (ref 101–111)
Creatinine, Ser: 1.08 mg/dL (ref 0.61–1.24)
GFR calc Af Amer: >60 mL/min (ref >60)
GFR calc non Af Amer: >60 mL/min (ref >60)
Glucose, Bld: 105 mg/dL — ABNORMAL HIGH (ref 65–99)
Potassium: 3.9 mmol/L (ref 3.5–5.1)
Sodium: 138 mmol/L (ref 135–145)
Total Bilirubin: 0.4 mg/dL (ref 0.3–1.2)
Total Protein: 6.7 g/dL (ref 6.5–8.1)

## 2015-10-19 LAB — LACTATE DEHYDROGENASE: LDH: 179 U/L (ref 98–192)

## 2015-10-19 LAB — PREALBUMIN: Prealbumin: 15.1 mg/dL — ABNORMAL LOW (ref 18–38)

## 2015-10-19 MED ORDER — LORAZEPAM 1 MG PO TABS: 1.0000 mg | ORAL_TABLET | Freq: Every day | ORAL | Status: DC

## 2015-10-19 NOTE — Patient Instructions (Signed)
1.  Stop amiodarone 2.  Increase Ativan to 1 mg hs as needed. 3.  Return to [place] clinic in 6 weeks.

## 2015-10-19 NOTE — Progress Notes (Addendum)
Symptom Yes No Details  Angina  x Activity:  Claudication  x How far:   Syncope  x When:  Stroke  x   Orthopnea  x How many pillows:  one  PND  x How often:  CPAP  n/a How many hrs:  Pedal edema  x   Abd fullness  x   N&V  x Good appetite; denies early satiety  Diaphoresis  x When:  Bleeding  x   Urine  x Clear yellow  SOB  x Activity:  Palpitations  x When:  ICD shock  x   Hospitlizaitons  x When/where/why:   ED visit  x When/where/why:  Other MD  x When/who/why:  Activity  x No limitations  Fluid   No limitations   Diet   Low sodium   Vital Signs:  Doppler modified systolic: 80 Automatic BP:  93/54 (69);  Repeat:  87/59 (74) HR: 85 SPO2:  92 %  Weight: 179.0  lb  Last wt: 175.4  lbs    VAD interrogation & Equipment Management (Reviewed with Dr Aundra Dubin): Speed: 9200 Flow: 5.4 Power: 5.7 w PI:  6.9 Alarms:  Several low voltage advisories Events: 0-5 PI events   Fixed speed: 9200 Low speed limit: 8600  Primary Controller: Replace back up battery in 26 months. Back up controller: Replace back up battery in 25 months.  Back up controller battery charged during visit as recommended per [employer]).   I reviewed the LVAD parameters from today and compared the results to the patient's prior recorded data.  LVAD interrogation was NEGATIVE for significant power changes, NEGATIVE for clinical alarms and negative for PI events/speed drops. No programming changes were made and pump is functioning within specified parameters.    LVAD equipment check completed and is in good working order. Back-up equipment present. LVAD education done on emergency procedures and precautions and reviewed exit site care.     Exit Site Care: Drive line is being maintained by his wife [name] daily. Sorbaview dressing with biopatch intact and drive line anchor intact and accurately applied. Exit site with no redness, tenderness, drainage, or foul odor noted; the velour is fully  implanted at exit site. Drive line anchor re-applied. Pt denies fever or chills. Driveline dressing is being changed weekly per sterile technique. Eight weekly dressing kits provided for home use.   ICD: Pt has Medtronic single lead ICD. Last remote check 10/03/15 revealed normal findings. Therapy on: VF zone 207 bpm.   Telemetry:  NSR today  Labs:  POC INR 2.4  Anticoagulation management includes INR goal of 2 - 2.5 with 81 mg. No med changes recently. See anticoagulation flow sheet.   LDH stable at 179 - with established baseline of 250 -300. Denies tea-colored urine.   Hgb 11.9  today   Encounter Details: Pt presents to 4 week f/u with wife. 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 Denies any heart failure symptoms, states energy level continues to improve. Has some complaints about joint stiffness and has started Glucosamine - chondroitin. He also reports he is taking Tylenol Arthritis prn.  Pt asked for increase in Ativan dose, reports not sleeping well, "too much energy".  Dr. Aundra Dubin addressed.  Will stop Amiodarone per Dr. Aundra Dubin.  Will send transplant referral to [facility] to Dr. Emelia Loron or Dr. Pilar Plate.  6 mo Intermacs follow up completed including:  Quality of Life, KCCQ-12,  and Neurocognitive trail making.   Pt completed 1560 feet during 6 minute walk.  Back up controller:  11V backup battery charged during this visit.   Patient Instructions: 1.  Stop amiodarone 2.  Increase Ativan to 1 mg hs as needed. 3.  Return to [place] clinic in 6 weeks.  Zada Girt,  RN VAD Coordinator  Office: [phone] 24/7 VAD Pager: [phone]    Patient ID: Patrick Humphrey, male   DOB: [birth year], 50 y.o.   MRN: [id number]    VAD CLINIC NOTE PCP: Dr Manuella Ghazi [facility]) Cardiology:  Dr. Gwenlyn Found HF Cardiology: Dr. Aundra Dubin  HPI: 50 yo with h/o systolic HF due to nonischemic cardiomyopathy EF 20-25% now s/p HM II LVAD on  05/10/15. Has Medtronic ICD.  He was admitted in 1/17 with acute on chronic systolic CHF and was found to be in cardiogenic shock.  Coronary angiography showed no significant CAD.  IABP was placed.  He underwent Heartmate II LVAD placement on 05/10/15.  Post-op course was complicated by atrial flutter, then atrial fibrillation.  He required TEE-guided DCCV.  He had RV failure and was on milrinone for a prolonged time but was able to titrate off and onto Revatio.   He returns for followup.  He continues to feel good. Doing anything he wants. Going to gym, working outside. Compliant with meds.  No orthopnea, PND or edema. No fevers, chills, bleeding. No need for diuretics. No bleeding.  He is in NSR today.   Denies LVAD alarms.  Denies driveline trauma, erythema or drainage.  Denies ICD shocks.    Reports taking Coumadin as prescribed and adherence to anticoagulation based dietary restrictions.  Denies bright red blood per rectum or melena, no dark urine or hematuria.  Rare PI events.    VAD interrogation & Equipment Management: See LVAD nurse's note above.  Labs (2/17): BNP 246, K 3.8, creatinine 0.72, AST 85, tbili 3, LDH 260, hemoglobin 10.3 Labs (3/17): K 4.2, creatinine 0.73, HCT 31.1, TSH 7.21 (elevated) Labs (4/17): creatinine 0.89, HCT 34, LDH 203, TSH elevated but free T3 and free T4 normal.  Labs (5/17): LFTs normal Labs (6/17): creatinine 1.03 Labs (7/17): HCT 36  Past Medical History  Diagnosis Date  . Nonischemic cardiomyopathy ([place])   . ICD (implantable cardioverter-defibrillator), single, in situ 08/26/2007  . Hx of echocardiogram 11/09/1910    Showed an EF of 25% with no significant valve disease.  Marland Kitchen AICD (automatic cardioverter/defibrillator) present   . Presence of permanent cardiac pacemaker   . CHF (congestive heart failure) ([place])    Social History   Social History  . Marital Status: Married    Spouse Name: N/A  . Number of Children: N/A  . Years of Education: N/A    Occupational History  . Not on file.   Social History Main Topics  . Smoking status: Former Smoker    Quit date: 02/20/1998  . Smokeless tobacco: Not on file  . Alcohol Use: 0.0 oz/week    0 Standard drinks or equivalent per week     Comment: has quit drinking x 1 1/2 months. He sporadically drinks  . Drug Use: No  . Sexual Activity: Yes   Other Topics Concern  . Not on file   Social History Narrative   Family History  Problem Relation Age of Onset  . Hypertension Mother   . Hypertension Father   . Cancer - Prostate Maternal Grandfather     Current Outpatient Prescriptions  Medication Sig Dispense Refill  .  aspirin EC 81 MG tablet Take 81 mg by mouth daily.    . cetirizine (ZYRTEC) 10 MG tablet Take 10 mg by mouth daily.    Marland Kitchen docusate sodium (COLACE) 100 MG capsule Take 2 capsules (200 mg total) by mouth daily as needed for mild constipation. 10 capsule 6  . LORazepam (ATIVAN) 1 MG tablet Take 1 tablet (1 mg total) by mouth at bedtime. 30 tablet 3  . losartan (COZAAR) 25 MG tablet Take 0.5 tablets (12.5 mg total) by mouth daily. 90 tablet 3  . montelukast (SINGULAIR) 10 MG tablet Take 1 tablet (10 mg total) by mouth at bedtime. 90 tablet 3  . ranitidine (ZANTAC) 150 MG capsule Take 150 mg by mouth every evening.    . sildenafil (REVATIO) 20 MG tablet Take 2 tablets (40 mg total) by mouth 3 (three) times daily. 180 tablet 6  . spironolactone (ALDACTONE) 25 MG tablet Take 1 tablet (25 mg total) by mouth daily. 90 tablet 3  . warfarin (COUMADIN) 2 MG tablet Take as directed by VAD Clinic 80 tablet 3  . furosemide (LASIX) 20 MG tablet Take 1 tablet by mouth as  needed (Patient not taking: Reported on 10/19/2015) 90 tablet 3   No current facility-administered medications for this encounter.    Penicillins and Sulfa antibiotics  REVIEW OF SYSTEMS: All systems negative except as listed in HPI, PMH and Problem list.   Vital Signs:  BP 80/0 mmHg  Pulse 85  Ht 5\' 11"  (1.803  m)  Wt 179 lb (81.194 kg)  BMI 24.98 kg/m2  SpO2 92%  Physical Exam: GENERAL: Well appearing, male who presents to clinic today in no acute distress. HEENT: normal  NECK: Supple, JVP  flat  2+ bilaterally, no bruits.  No lymphadenopathy or thyromegaly appreciated.   CARDIAC:  Mechanical heart sounds with LVAD hum present.  LUNGS:  Clear to auscultation bilaterally.  ABDOMEN:  Soft,  nontender, nondistended positive bowel sounds x4.     LVAD exit site: well-healed and incorporated.  Dressing dry and intact.  No erythema or drainage.  Stabilization device present and accurately applied.  Driveline dressing is being changed daily per sterile technique. EXTREMITIES:  Warm and dry, no cyanosis, clubbing, rash or edema  NEUROLOGIC:  Alert and oriented x 4.  Gait steady.  No aphasia.  No dysarthria.  Affect pleasant.     ASSESSMENT AND PLAN:   1. Status post Heartmate II LVAD implantation 1/17:  Patient comes to clinic today for routine follow up visit. Currently has NYHA class 1 symptoms on LVAD support. In hospital, he had RV failure.  MAP stable.  Few PI events.  - He will stay off Lasix.   - Continue Revatio 40 mg tid for RV support.  - Exercising on his own.  - Working on transplant referral to [facility]. Blood type A+ - VAD parameters ok. 2.  Anticoagulation management: INR goal 2.0-2.5.  He will also continue ASA 81 daily.  No evidence for overt bleeding.  - Check INR today.  3. Chronic systolic CHF: Nonischemic cardiomyopathy.   - Continue losartan 12.5 mg daily, spironolactone 25 daily.  4. Atrial fibrillation/flutter: He remains in NSR today.  He only had atrial fibrillation/flutter post-op. I am going to stop amiodarone today.  - Continue warfarin.  5.  ICD at EOL: He has decided against generator change. 7. HTN: controlled.   [name] MD 10/20/2015

## 2015-10-20 NOTE — Addendum Note (Signed)
Encounter addended by: Larey Dresser, MD on: 10/20/2015 12:15 AM<BR>     Documentation filed: Problem List, Visit Diagnoses, Follow-up Section, LOS Section

## 2015-10-21 ENCOUNTER — Encounter (HOSPITAL_COMMUNITY): Payer: Self-pay | Admitting: *Deleted

## 2015-10-26 ENCOUNTER — Ambulatory Visit (HOSPITAL_COMMUNITY): Payer: Self-pay | Admitting: *Deleted

## 2015-10-26 LAB — POCT INR: INR: 2.4

## 2015-10-27 ENCOUNTER — Ambulatory Visit (HOSPITAL_COMMUNITY): Payer: Self-pay | Admitting: Infectious Diseases

## 2015-10-27 LAB — POCT INR: INR: 2.4

## 2015-11-01 ENCOUNTER — Ambulatory Visit (HOSPITAL_COMMUNITY): Payer: Self-pay | Admitting: Unknown Physician Specialty

## 2015-11-01 DIAGNOSIS — Z95811 Presence of heart assist device: Secondary | ICD-10-CM

## 2015-11-01 LAB — POCT INR: INR: 2.4

## 2015-11-03 ENCOUNTER — Ambulatory Visit (HOSPITAL_COMMUNITY)
Admission: RE | Admit: 2015-11-03 | Discharge: 2015-11-03 | Disposition: A | Discharge status: Home / Self Care | Payer: Medicare Other | Source: Ambulatory Visit | Attending: Emergency Medicine | Admitting: Emergency Medicine

## 2015-11-03 ENCOUNTER — Encounter (HOSPITAL_COMMUNITY): Payer: Medicare Other

## 2015-11-03 DIAGNOSIS — I5022 Chronic systolic (congestive) heart failure: Secondary | ICD-10-CM

## 2015-11-03 DIAGNOSIS — Z4801 Encounter for change or removal of surgical wound dressing: Secondary | ICD-10-CM | POA: Diagnosis present

## 2015-11-03 NOTE — Progress Notes (Addendum)
In to clinic today with reported "tenderness to driveline site and redness" with last nights dressing change.   LVAD Exit Site:   VAD dressing removed and site care performed using sterile technique. Small amount of redness to site but looks more like pressure from DL than erythema. Scant amount of thin serous/yellow drainage without odor. I palpated directly on the site and up the abdominal wall where DL routes and he is without tenderness on exam today. Drive line exit site cleaned with Chlora prep applicators x 2, allowed to dry, and changed to gauze dressing with Aquacell Ag strips. Advised to continue with these dressing changes as he works outside and gets sweaty with the heat. They have been changing the dressing with weekly kits 2 - 3 times a week. D/W Amy Clegg and does not think we need to use antibiotics or perform culture at this time (as there is nothing draining today to collect).   Instructed to CB should the redness/swelling or drainage persist or increase. Low threshold with a CB to initiate antibiotic therapy (prefer Keflex vs. Doxycycline with his frequent exposure to the sun).   Provided with 14 dressings and 5 anchor devices to take home as well as Aquacel Ag strips (single sheet).  Controller Upgrade: HeartMate II System Controller SW Version 7.29 upgrade performed in clinic today to accommodate new recommendations from [employer]. Consent reviewed and signed. Tem Tech representative Ronalee Belts) performed software upgrade.   VAD Team performed elective controller exchange. Patient tolerated controller exchange well and was asymptomatic. Pump restarted as expected with stable VAD parameters on correct prescribed speed of 9200 / 8600 rpms.    Primary: [id number] Back up: [place], RN VAD Coordinator   Office: [phone] 24/7 Emergency VAD Pager: [phone]

## 2015-11-04 ENCOUNTER — Telehealth (HOSPITAL_COMMUNITY): Payer: Self-pay | Admitting: Unknown Physician Specialty

## 2015-11-04 NOTE — Telephone Encounter (Signed)
Page re: pt is concerned about driveline site. Pt states that his driveline is red, tender and has "yellow" drainage. Pt states they only have weekly kits at home but have been changing his dressing "every 3 or 4 days." Pt was instructed to come to clinic tomorrow so that the VAD team can assess his driveline for further direction. Pt verbalized understanding.

## 2015-11-07 ENCOUNTER — Telehealth (HOSPITAL_COMMUNITY): Payer: Self-pay | Admitting: Infectious Diseases

## 2015-11-07 NOTE — Telephone Encounter (Signed)
Called for update re: Patrick Humphrey's transplant referral packet. Patrick Humphrey tells me that he is out of network for their service ([facility]). Letter was mailed to patient on 10/31/15. Will d/w team about next steps. Patient may need to call insurance to see where accepting sites are.   Patrick Madeira, RN VAD Coordinator   Office: [phone] 24/7 Emergency VAD Pager: [phone]

## 2015-11-08 ENCOUNTER — Ambulatory Visit (HOSPITAL_COMMUNITY): Payer: Self-pay | Admitting: Infectious Diseases

## 2015-11-08 LAB — POCT INR: INR: 2

## 2015-11-09 ENCOUNTER — Ambulatory Visit (HOSPITAL_COMMUNITY)
Admission: RE | Admit: 2015-11-09 | Discharge: 2015-11-09 | Disposition: A | Discharge status: Home / Self Care | Payer: Medicare Other | Source: Ambulatory Visit | Attending: Internal Medicine | Admitting: Internal Medicine

## 2015-11-09 ENCOUNTER — Telehealth: Payer: Self-pay | Admitting: Infectious Diseases

## 2015-11-09 ENCOUNTER — Other Ambulatory Visit (HOSPITAL_COMMUNITY): Payer: Self-pay | Admitting: Unknown Physician Specialty

## 2015-11-09 DIAGNOSIS — Z95811 Presence of heart assist device: Secondary | ICD-10-CM

## 2015-11-09 DIAGNOSIS — Z7901 Long term (current) use of anticoagulants: Secondary | ICD-10-CM
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
## 2015-11-15 ENCOUNTER — Ambulatory Visit (HOSPITAL_COMMUNITY): Payer: Self-pay | Admitting: Infectious Diseases

## 2015-11-15 ENCOUNTER — Telehealth (HOSPITAL_COMMUNITY): Payer: Self-pay | Admitting: Infectious Diseases

## 2015-11-15 LAB — POCT INR: INR: 3.8

## 2015-11-15 NOTE — Telephone Encounter (Signed)
A user error has taken place: encounter opened in error, closed for administrative reasons.

## 2015-11-22 ENCOUNTER — Ambulatory Visit: Payer: Self-pay | Admitting: Unknown Physician Specialty

## 2015-11-22 DIAGNOSIS — Z95811 Presence of heart assist device: Secondary | ICD-10-CM

## 2015-11-22 LAB — POCT INR: INR: 2.5

## 2015-11-29 ENCOUNTER — Ambulatory Visit (HOSPITAL_COMMUNITY): Payer: Self-pay | Admitting: Unknown Physician Specialty

## 2015-11-29 DIAGNOSIS — Z95811 Presence of heart assist device: Secondary | ICD-10-CM

## 2015-11-29 LAB — POCT INR: INR: 2.6

## 2015-11-30 ENCOUNTER — Ambulatory Visit (HOSPITAL_COMMUNITY): Payer: Medicare Other

## 2015-12-06 ENCOUNTER — Ambulatory Visit (HOSPITAL_COMMUNITY): Payer: Self-pay | Admitting: Unknown Physician Specialty

## 2015-12-06 DIAGNOSIS — Z95811 Presence of heart assist device: Secondary | ICD-10-CM

## 2015-12-06 LAB — POCT INR: INR: 2.7

## 2015-12-07 ENCOUNTER — Encounter (HOSPITAL_COMMUNITY): Payer: Self-pay

## 2015-12-07 ENCOUNTER — Ambulatory Visit (HOSPITAL_COMMUNITY)
Admission: RE | Admit: 2015-12-07 | Discharge: 2015-12-07 | Disposition: A | Discharge status: Home / Self Care | Payer: Medicare Other | Source: Ambulatory Visit | Attending: Cardiology | Admitting: Cardiology

## 2015-12-07 ENCOUNTER — Other Ambulatory Visit (HOSPITAL_COMMUNITY): Payer: Self-pay | Admitting: Infectious Diseases

## 2015-12-07 DIAGNOSIS — I5041 Acute combined systolic (congestive) and diastolic (congestive) heart failure: Secondary | ICD-10-CM

## 2015-12-07 DIAGNOSIS — Z7982 Long term (current) use of aspirin: Secondary | ICD-10-CM | POA: Diagnosis not present

## 2015-12-07 DIAGNOSIS — Z7901 Long term (current) use of anticoagulants: Secondary | ICD-10-CM | POA: Insufficient documentation

## 2015-12-07 DIAGNOSIS — I4892 Unspecified atrial flutter: Secondary | ICD-10-CM | POA: Diagnosis not present

## 2015-12-07 DIAGNOSIS — Z87891 Personal history of nicotine dependence: Secondary | ICD-10-CM | POA: Diagnosis not present

## 2015-12-07 DIAGNOSIS — Z95811 Presence of heart assist device: Secondary | ICD-10-CM | POA: Insufficient documentation

## 2015-12-07 DIAGNOSIS — Z95810 Presence of automatic (implantable) cardiac defibrillator: Secondary | ICD-10-CM | POA: Insufficient documentation

## 2015-12-07 DIAGNOSIS — Z8249 Family history of ischemic heart disease and other diseases of the circulatory system: Secondary | ICD-10-CM | POA: Insufficient documentation

## 2015-12-07 DIAGNOSIS — I428 Other cardiomyopathies: Secondary | ICD-10-CM | POA: Insufficient documentation

## 2015-12-07 DIAGNOSIS — I4891 Unspecified atrial fibrillation: Secondary | ICD-10-CM | POA: Diagnosis not present

## 2015-12-07 DIAGNOSIS — I5022 Chronic systolic (congestive) heart failure: Secondary | ICD-10-CM | POA: Insufficient documentation

## 2015-12-07 DIAGNOSIS — Z79899 Other long term (current) drug therapy: Secondary | ICD-10-CM | POA: Diagnosis not present

## 2015-12-07 DIAGNOSIS — I110 Hypertensive heart disease with heart failure: Secondary | ICD-10-CM | POA: Insufficient documentation

## 2015-12-07 LAB — BASIC METABOLIC PANEL
Anion gap: 8 (ref 5–15)
BUN: 15 mg/dL (ref 6–20)
CO2: 24 mmol/L (ref 22–32)
Calcium: 9.2 mg/dL (ref 8.9–10.3)
Chloride: 106 mmol/L (ref 101–111)
Creatinine, Ser: 0.94 mg/dL (ref 0.61–1.24)
GFR calc Af Amer: >60 mL/min (ref >60)
GFR calc non Af Amer: >60 mL/min (ref >60)
Glucose, Bld: 87 mg/dL (ref 65–99)
Potassium: 4 mmol/L (ref 3.5–5.1)
Sodium: 138 mmol/L (ref 135–145)

## 2015-12-07 LAB — LACTATE DEHYDROGENASE: LDH: 182 U/L (ref 98–192)

## 2015-12-07 LAB — CBC
HCT: 38.7 % — ABNORMAL LOW (ref 39.0–52.0)
Hemoglobin: 12.7 g/dL — ABNORMAL LOW (ref 13.0–17.0)
MCH: 29.9 pg (ref 26.0–34.0)
MCHC: 32.8 g/dL (ref 30.0–36.0)
MCV: 91.1 fL (ref 78.0–100.0)
Platelets: 164 10*3/uL (ref 150–400)
RBC: 4.25 MIL/uL (ref 4.22–5.81)
RDW: 14.7 % (ref 11.5–15.5)
WBC: 4.6 10*3/uL (ref 4.0–10.5)

## 2015-12-07 NOTE — Patient Instructions (Addendum)
Come back to see [place] in 2 months. No medication changes.   Can use over the counter 1% hydrocortisone cream twice a day / Aquaphor to the itchy areas near your drive line that are NOT UNDER the dressing.   Dressing changes every 1-3 days with gauze kits and as needed for sweating/soiling. Can try weekly kits again in the fall as it cools off. Call us if there is any increased drainage.   We will call you with your labs today.

## 2015-12-07 NOTE — Progress Notes (Signed)
Lab results called to [place].

## 2015-12-07 NOTE — Progress Notes (Addendum)
Symptom Yes No Details  Angina  x Activity:  Claudication  x How far:   Syncope  x When:  Stroke  x   Orthopnea  x How many pillows:  one  PND  x How often:  CPAP  n/a How many hrs:  Pedal edema  x   Abd fullness  x   N&V  x Good appetite; denies early satiety  Diaphoresis  x When:  Bleeding  x   Urine  x Clear yellow  SOB  x Activity:  Palpitations  x When:  ICD shock  x   Hospitlizaitons  x When/where/why:   ED visit  x When/where/why:  Other MD  x When/who/why:  Activity  x No limitations  Fluid   No limitations   Diet   Low sodium   Vital Signs:  Doppler modified systolic: 123XX123 Automatic BP:  97/72 (85) SPO2:  93 %  Weight: 183.8 lb  Last wt: 179.0 lb     VAD interrogation & Equipment Management (Reviewed with Dr Aundra Dubin): Speed: 9200 Flow: 5.2 Power: 5.6 w PI: 6.2  Alarms:  Several low voltage advisories Events: < 6 a day few days a weeek  Fixed speed: 9200 Low speed limit: 8600  Primary Controller: Replace back up battery in 24 months. Back up controller: Replace back up battery in 23 months.  I reviewed the LVAD parameters from today and compared the results to the patient's prior recorded data.  LVAD interrogation was NEGATIVE for significant power changes, NEGATIVE for clinical alarms and STABLE for PI events/speed drops. No programming changes were made and pump is functioning within specified parameters.    LVAD equipment check completed and is in good working order. Back-up equipment present. LVAD education done on emergency procedures and precautions and reviewed exit site care.    Encounter Details: Pt presents to 6 week f/u with wife. Reports he is doing well and has no concerns today. Has an appt with [facility] for Transplant Evaluation 12/22/15. Denies any heart failure symptoms, states energy level continues to improve. Joint pain has improved since taking Bio-Flex. Only concern was possible drainage over exit site (See below).   Sleeping better  since increasing Ativan dose and tolerating med changes from last visit well.    Exit Site Care: Drive line is being maintained by his wife [name] every 1-3 days. Reports that there is a small amount of drainage to the Aquacel Ag strips and some redness. Pt denies fever or chills. Reports he has been sweating more often being out and about. Reports some itching under where anchor was (now in new location to offload).   Once dressing broken down no drainage on ribbon, small yellow crusts noted and some mild pink skin from pressure of DL. Otherwise site is well healed with velour fully buried. No drainage elicited with palpation, no pain, tenderness, warmth or induration to site. Advised that she can space out dressings to every 3 days and once the weather starts getting cooler can trial weekly kits again, which may be changed more often as well if they desire. Provided patient with 24 daily dressings (1 also applied in clinic today), 2 sheets of Aquacel Ag strips and extra size 6 gloves for [name] to perform changes.     ICD: Pt has Medtronic single lead ICD. Last remote check 10/03/15 revealed normal findings. Therapy on: VF zone 207 bpm.    Labs: POC INR 2.4  Anticoagulation management includes INR goal of 2 - 2.5 with 81  mg. No med changes recently. See anticoagulation flow sheet.   LDH stable at 182 - with established baseline of 170-300. Denies tea-colored urine.   Hgb 12.7 today with normal WBCs. No S/S of bleeding reported including BRBPR or melanic stools. Not currently taking iron. Tolerating anticoagulation plan.   Creatinine 0.94 with stable electrolytes.   Patient Instructions: Come back to see [place] in 2 months. No medication changes. Call to have Coordinator/MD evaluate site should there be drainage to DL site.   Can use over the counter 1% hydrocortisone cream twice a day / Aquaphor to the itchy areas near your drive line that are NOT UNDER the dressing.   Dressing changes every  1-3 days with gauze kits and as needed for sweating/soiling. Can try weekly kits again in the fall as it cools off. Call us if there is any increased drainage.   We will call you with your labs today.    Janene Madeira, RN VAD Coordinator   Office: [phone] 24/7 Emergency VAD Pager: [phone]   Patient ID: OHENE PETROSYAN, male   DOB: Mar 05, 1966, 50 y.o.   MRN: [id number]    VAD CLINIC NOTE PCP: Dr Manuella [facility]) Cardiology:  Dr. Gwenlyn Found HF Cardiology: Dr. Aundra Dubin  HPI: 50 yo with h/o systolic HF due to nonischemic cardiomyopathy EF 20-25% now s/p HM II LVAD on 05/10/15. Has Medtronic ICD.  He was admitted in 1/17 with acute on chronic systolic CHF and was found to be in cardiogenic shock.  Coronary angiography showed no significant CAD.  IABP was placed.  He underwent Heartmate II LVAD placement on 05/10/15.  Post-op course was complicated by atrial flutter, then atrial fibrillation.  He required TEE-guided DCCV.  He had RV failure and was on milrinone for a prolonged time but was able to titrate off and onto Revatio.  He is now off amiodarone.   He returns for followup.  He continues to feel good. Doing anything he wants. Going to gym, working outside. Compliant with meds.  No orthopnea, PND or edema. No fevers, chills, bleeding. No need for diuretics. No bleeding.  He is in NSR today.   Denies LVAD alarms.  Denies driveline trauma, erythema or drainage.  Denies ICD shocks.    Reports taking Coumadin as prescribed and adherence to anticoagulation based dietary restrictions.  Denies bright red blood per rectum or melena, no dark urine or hematuria.  Rare PI events.    VAD interrogation & Equipment Management: See LVAD nurse's note above.  Labs (2/17): BNP 246, K 3.8, creatinine 0.72, AST 85, tbili 3, LDH 260, hemoglobin 10.3 Labs (3/17): K 4.2, creatinine 0.73, HCT 31.1, TSH 7.21 (elevated) Labs (4/17): creatinine 0.89, HCT 34, LDH 203, TSH elevated but free T3 and free T4 normal.    Labs (5/17): LFTs normal Labs (6/17): creatinine 1.03 Labs (7/17): HCT 36  Past Medical History:  Diagnosis Date  . AICD (automatic cardioverter/defibrillator) present   . CHF (congestive heart failure) ([place])   . Hx of echocardiogram 11/09/1910   Showed an EF of 25% with no significant valve disease.  . ICD (implantable cardioverter-defibrillator), single, in situ 08/26/2007  . Nonischemic cardiomyopathy ([place])   . Presence of permanent cardiac pacemaker    Social History   Social History  . Marital status: Married    Spouse name: N/A  . Number of children: N/A  . Years of education: N/A   Occupational History  . Not on file.   Social History Main Topics  . Smoking  status: Former Smoker    Quit date: 02/20/1998  . Smokeless tobacco: Not on file  . Alcohol use 0.0 oz/week     Comment: has quit drinking x 1 1/2 months. He sporadically drinks  . Drug use: No  . Sexual activity: Yes   Other Topics Concern  . Not on file   Social History Narrative  . No narrative on file   Family History  Problem Relation Age of Onset  . Hypertension Mother   . Hypertension Father   . Cancer - Prostate Maternal Grandfather     Current Outpatient Prescriptions  Medication Sig Dispense Refill  . aspirin EC 81 MG tablet Take 81 mg by mouth daily.    Marland Kitchen Bioflavonoid Products (BIOFLEX PO) Take by mouth.    . Biotin 1000 MCG tablet Take 1,000 mcg by mouth 3 (three) times daily.    . cetirizine (ZYRTEC) 10 MG tablet Take 10 mg by mouth daily.    Marland Kitchen co-enzyme Q-10 30 MG capsule Take 30 mg by mouth 3 (three) times daily.    Marland Kitchen docusate sodium (COLACE) 100 MG capsule Take 2 capsules (200 mg total) by mouth daily as needed for mild constipation. 10 capsule 6  . furosemide (LASIX) 20 MG tablet Take 1 tablet by mouth as  needed 90 tablet 3  . LORazepam (ATIVAN) 1 MG tablet Take 1 tablet (1 mg total) by mouth at bedtime. 30 tablet 3  . losartan (COZAAR) 25 MG tablet Take 0.5 tablets (12.5 mg  total) by mouth daily. 90 tablet 3  . montelukast (SINGULAIR) 10 MG tablet Take 1 tablet (10 mg total) by mouth at bedtime. 90 tablet 3  . Omega-3 Fatty Acids (FISH OIL) 1000 MG CAPS Take by mouth.    . ranitidine (ZANTAC) 150 MG capsule Take 150 mg by mouth every evening.    . sildenafil (REVATIO) 20 MG tablet Take 2 tablets (40 mg total) by mouth 3 (three) times daily. 180 tablet 6  . spironolactone (ALDACTONE) 25 MG tablet Take 1 tablet (25 mg total) by mouth daily. 90 tablet 3  . warfarin (COUMADIN) 2 MG tablet Take as directed by VAD Clinic 80 tablet 3   No current facility-administered medications for this encounter.     Penicillins and Sulfa antibiotics  REVIEW OF SYSTEMS: All systems negative except as listed in HPI, PMH and Problem list.   Vital Signs:  BP 97/72 Comment: 85 MAP  Pulse 96   Ht 5\' 11"  (1.803 m)   Wt 183 lb 12.8 oz (83.4 kg)   SpO2 93%   BMI 25.63 kg/m   MAP 85  Physical Exam: GENERAL: Well appearing, male who presents to clinic today in no acute distress. HEENT: normal  NECK: Supple, JVP  flat  2+ bilaterally, no bruits.  No lymphadenopathy or thyromegaly appreciated.   CARDIAC:  Mechanical heart sounds with LVAD hum present.  LUNGS:  Clear to auscultation bilaterally.  ABDOMEN:  Soft,  nontender, nondistended positive bowel sounds x4.     LVAD exit site: well-healed and incorporated.  Dressing dry and intact.  No erythema or drainage.  Stabilization device present and accurately applied.  Driveline dressing is being changed daily per sterile technique. EXTREMITIES:  Warm and dry, no cyanosis, clubbing, rash or edema  NEUROLOGIC:  Alert and oriented x 4.  Gait steady.  No aphasia.  No dysarthria.  Affect pleasant.     ASSESSMENT AND PLAN:   1. Status post Heartmate II LVAD implantation 1/17:  Patient  comes to clinic today for routine follow up visit. Currently has NYHA class 1 symptoms on LVAD support. In hospital, he had RV failure.  MAP stable.  Few PI  events.  - He will stay off Lasix.   - Continue Revatio 40 mg tid for RV support.  - Exercising on his own.  - Insurance not taken at [facility], will be seen for transplant evaluation at [facility]. Blood type A+ - VAD parameters ok. 2.  Anticoagulation management: INR goal 2.0-2.5.  He will also continue ASA 81 daily.  No evidence for overt bleeding.  - Check INR today.  3. Chronic systolic CHF: Nonischemic cardiomyopathy.   - Continue losartan 12.5 mg daily, spironolactone 25 daily.  4. Atrial fibrillation/flutter: He remains in NSR today.  He only had atrial fibrillation/flutter post-op. He is now off amiodarone.  - Continue warfarin.  5.  ICD at EOL: He has decided against generator change. 7. HTN: controlled.   [name] MD 12/07/2015

## 2015-12-09 ENCOUNTER — Telehealth (HOSPITAL_COMMUNITY): Payer: Self-pay | Admitting: Infectious Diseases

## 2015-12-09 NOTE — Telephone Encounter (Signed)
Called office on the way to [facility] to report they are going to be in that area over the weekend. Not many VAD Centers that are close to where they are at [phone] zip code. [facility] is closest available at ~130 miles with [facility] 140 miles. Advised that should they need emergency assistance to go to the closest hospital locally and page our VAD team so we can facilitate as needed.   [facility], [place] [phone] VAD Office: [place], RN VAD Coordinator   Office: [phone] 24/7 Emergency VAD Pager: [phone]

## 2015-12-13 ENCOUNTER — Other Ambulatory Visit (HOSPITAL_COMMUNITY): Payer: Self-pay | Admitting: Internal Medicine

## 2015-12-13 DIAGNOSIS — Z95811 Presence of heart assist device: Secondary | ICD-10-CM

## 2015-12-13 DIAGNOSIS — Z7901 Long term (current) use of anticoagulants: Secondary | ICD-10-CM

## 2015-12-13 LAB — POCT INR
INR: 2.2
INR: 2.2

## 2015-12-14 ENCOUNTER — Telehealth (HOSPITAL_COMMUNITY): Payer: Self-pay | Admitting: *Deleted

## 2015-12-14 ENCOUNTER — Ambulatory Visit (HOSPITAL_COMMUNITY): Payer: Self-pay | Admitting: Unknown Physician Specialty

## 2015-12-14 DIAGNOSIS — Z95811 Presence of heart assist device: Secondary | ICD-10-CM

## 2015-12-14 DIAGNOSIS — Z792 Long term (current) use of antibiotics: Secondary | ICD-10-CM

## 2015-12-14 MED ORDER — CLINDAMYCIN HCL 300 MG PO CAPS: 600.0000 mg | ORAL_CAPSULE | ORAL | 2 refills | Status: DC | PRN

## 2015-12-14 NOTE — Telephone Encounter (Signed)
Pt called to report "cracked filling" and asking what he should do. Recommended f/o with dentist asap. Pt will need to tske prophylactic antibiotic one hour prior to any dental procedures due to his LVAD. Pt has listed PCN allergy. Will send in appropriate Rx per Dr. Aundra Dubin. Pt verbalized understanding of same.

## 2015-12-19 NOTE — Progress Notes (Signed)
error 

## 2015-12-20 ENCOUNTER — Ambulatory Visit (HOSPITAL_COMMUNITY): Payer: Self-pay | Admitting: Unknown Physician Specialty

## 2015-12-20 DIAGNOSIS — Z95811 Presence of heart assist device: Secondary | ICD-10-CM

## 2015-12-20 LAB — POCT INR: INR: 2.3

## 2015-12-22 ENCOUNTER — Telehealth (HOSPITAL_COMMUNITY): Payer: Self-pay | Admitting: Unknown Physician Specialty

## 2015-12-22 NOTE — Telephone Encounter (Signed)
Pt called the office stating that his controller beeped once. Pt states the screen said connect power. I instructed pt to check both connections to his batteries to ensure a tight connection. Pt states that both batteries have 4 bars and are connected. Pt was instructed to change batteries. Pt is currently on his way to [facility] for his transplant evaluation. Pt also instructed to make sure batteries do not need to be calibrated. Pt and caregiver educated on calibration. Pt instructed to call back if the beeping continues as we may need to interrogate his pump. Pt and caregiver verbalized understanding.

## 2015-12-27 ENCOUNTER — Ambulatory Visit (HOSPITAL_COMMUNITY): Payer: Self-pay | Admitting: *Deleted

## 2015-12-27 DIAGNOSIS — Z95811 Presence of heart assist device: Secondary | ICD-10-CM

## 2015-12-27 LAB — POCT INR: INR: 2.9

## 2015-12-30 ENCOUNTER — Encounter (INDEPENDENT_AMBULATORY_CARE_PROVIDER_SITE_OTHER): Payer: Self-pay

## 2016-01-03 ENCOUNTER — Ambulatory Visit (HOSPITAL_COMMUNITY): Payer: Self-pay | Admitting: Infectious Diseases

## 2016-01-03 ENCOUNTER — Other Ambulatory Visit (HOSPITAL_COMMUNITY): Payer: Self-pay | Admitting: *Deleted

## 2016-01-03 DIAGNOSIS — F419 Anxiety disorder, unspecified: Secondary | ICD-10-CM

## 2016-01-03 LAB — POCT INR: INR: 2.4

## 2016-01-03 MED ORDER — LORAZEPAM 1 MG PO TABS: 1.0000 mg | ORAL_TABLET | Freq: Every day | ORAL | 3 refills | Status: DC

## 2016-01-10 ENCOUNTER — Ambulatory Visit (HOSPITAL_COMMUNITY): Payer: Self-pay | Admitting: Unknown Physician Specialty

## 2016-01-10 ENCOUNTER — Other Ambulatory Visit (HOSPITAL_COMMUNITY): Payer: Self-pay | Admitting: Pharmacist

## 2016-01-10 DIAGNOSIS — Z95811 Presence of heart assist device: Secondary | ICD-10-CM

## 2016-01-10 LAB — POCT INR: INR: 2

## 2016-01-10 MED ORDER — FUROSEMIDE 20 MG PO TABS: 20.0000 mg | ORAL_TABLET | Freq: Every day | ORAL | 3 refills | Status: DC | PRN

## 2016-01-13 ENCOUNTER — Telehealth (HOSPITAL_COMMUNITY): Payer: Self-pay | Admitting: Infectious Diseases

## 2016-01-13 ENCOUNTER — Other Ambulatory Visit (HOSPITAL_COMMUNITY): Payer: Self-pay | Admitting: *Deleted

## 2016-01-13 NOTE — Telephone Encounter (Signed)
Called re: plan for anticoagulation for upcoming [place] on 02/09/16 at [facility] for transplant evaluation. Instructed that we typically will cath w/o holding warfarin and check an INR 3d prior to procedure to ensure it is w/in ranged. Will hold if needed but not routinely.   I reached out to [facility] Txplant Coordinator Charlann Noss to verify this is an acceptable plan for them. Will confirm plan with [facility] after I hear back from them.   Janene Madeira, RN VAD Coordinator   Office: [phone] 24/7 Emergency VAD Pager: [phone]

## 2016-01-18 ENCOUNTER — Ambulatory Visit (HOSPITAL_COMMUNITY): Payer: Self-pay | Admitting: Infectious Diseases

## 2016-01-18 LAB — POCT INR: INR: 2.1

## 2016-01-20 ENCOUNTER — Encounter (HOSPITAL_COMMUNITY): Payer: Medicare Other

## 2016-01-25 ENCOUNTER — Ambulatory Visit (HOSPITAL_COMMUNITY): Payer: Self-pay | Admitting: Pharmacist

## 2016-01-25 DIAGNOSIS — Z95811 Presence of heart assist device: Secondary | ICD-10-CM

## 2016-01-25 LAB — POCT INR: INR: 1.7

## 2016-01-27 ENCOUNTER — Encounter (HOSPITAL_COMMUNITY): Payer: Medicare Other

## 2016-01-30 ENCOUNTER — Encounter (HOSPITAL_COMMUNITY): Payer: Medicare Other

## 2016-01-31 ENCOUNTER — Ambulatory Visit (HOSPITAL_COMMUNITY): Payer: Self-pay | Admitting: Pharmacist

## 2016-01-31 DIAGNOSIS — Z95811 Presence of heart assist device: Secondary | ICD-10-CM

## 2016-01-31 LAB — POCT INR: INR: 1.8

## 2016-02-04 ENCOUNTER — Other Ambulatory Visit: Payer: Self-pay | Admitting: Cardiology

## 2016-02-04 DIAGNOSIS — F419 Anxiety disorder, unspecified: Secondary | ICD-10-CM

## 2016-02-06 ENCOUNTER — Encounter (HOSPITAL_COMMUNITY): Payer: Medicare Other

## 2016-02-06 NOTE — Telephone Encounter (Signed)
This patient is followed by Dr Aundra Dubin in the [facility], will forward.

## 2016-02-07 ENCOUNTER — Other Ambulatory Visit (HOSPITAL_COMMUNITY): Payer: Self-pay

## 2016-02-07 ENCOUNTER — Telehealth (HOSPITAL_COMMUNITY): Payer: Self-pay

## 2016-02-07 LAB — POCT INR: INR: 1.9

## 2016-02-07 NOTE — Telephone Encounter (Signed)
[place] scheduled for Thursday 02/09/2016 at 1200 noon with Dr. Aundra Dubin with check in time to main entrance of [facility] hospital at 10:00 am. Will add on echo and ekg same day as well per [facility] request as this was originally set up at [facility]. Patient prefers to have this done here. VAD Coordinator Janene Madeira and patient both made aware of arrangements. Will coordinate coumadin and other instructions with provider and call patient tomorrow morning with specific instructions.  Renee Pain, RN

## 2016-02-07 NOTE — Telephone Encounter (Signed)
Ok, that would be fine.

## 2016-02-08 ENCOUNTER — Telehealth (HOSPITAL_COMMUNITY): Payer: Self-pay

## 2016-02-08 ENCOUNTER — Ambulatory Visit (HOSPITAL_COMMUNITY): Payer: Self-pay | Admitting: Pharmacist

## 2016-02-08 DIAGNOSIS — Z95811 Presence of heart assist device: Secondary | ICD-10-CM

## 2016-02-08 NOTE — Telephone Encounter (Signed)
RHC to be done at [facility] tomorrow. Verified with patient and coordinator at [facility]. Cancelled procedure here.

## 2016-02-08 NOTE — Telephone Encounter (Signed)
Cath instructions reviewed over the phone with patient and wife. Advised to hold lasix and spironolactone morning of procedure, and to remain NPO after midnight tonight. Reminded to arrive to main entrance A of [facility] tomorrow morning with wife present to drive him home after procedure. Also advised per CHF clinical pharmacist Doroteo Bradford to take 4 mg coumadin tonight as ordered for INR yesterday on 1.9 Reviewed new coumadin instructions with patient as follows: Take 6 mg on Monday Wednesday Friday (starting this Friday), and to take 4mg  all other days (including tonight). Aware and agreeable to plan as stated above. Advised to return call to CHF clinic or page VAD pager for any further questions/concerns/needs.  Renee Pain, RN

## 2016-02-09 ENCOUNTER — Encounter (HOSPITAL_COMMUNITY): Admission: RE | Payer: Self-pay | Source: Ambulatory Visit

## 2016-02-09 ENCOUNTER — Ambulatory Visit (HOSPITAL_COMMUNITY): Admission: RE | Admit: 2016-02-09 | Payer: Medicare Other | Source: Ambulatory Visit | Admitting: Cardiology

## 2016-02-09 SURGERY — RIGHT HEART CATH
Anesthesia: LOCAL

## 2016-02-10 ENCOUNTER — Encounter (HOSPITAL_COMMUNITY): Payer: Medicare Other

## 2016-02-13 ENCOUNTER — Other Ambulatory Visit (HOSPITAL_COMMUNITY): Payer: Self-pay | Admitting: Infectious Diseases

## 2016-02-13 DIAGNOSIS — F419 Anxiety disorder, unspecified: Secondary | ICD-10-CM

## 2016-02-13 MED ORDER — LORAZEPAM 1 MG PO TABS: 1.0000 mg | ORAL_TABLET | Freq: Every day | ORAL | 3 refills | Status: DC

## 2016-02-14 LAB — POCT INR: INR: 2

## 2016-02-15 ENCOUNTER — Ambulatory Visit (HOSPITAL_COMMUNITY)
Admission: RE | Admit: 2016-02-15 | Discharge: 2016-02-15 | Disposition: A | Discharge status: Home / Self Care | Payer: Medicare Other | Source: Ambulatory Visit | Attending: Cardiology | Admitting: Cardiology

## 2016-02-15 ENCOUNTER — Ambulatory Visit (HOSPITAL_COMMUNITY): Payer: Self-pay | Admitting: Pharmacist

## 2016-02-15 ENCOUNTER — Other Ambulatory Visit (HOSPITAL_COMMUNITY): Payer: Self-pay | Admitting: Infectious Diseases

## 2016-02-15 VITALS — BP 84/0 | HR 95 | Resp 18 | Ht 71.0 in | Wt 184.4 lb

## 2016-02-15 DIAGNOSIS — Z87891 Personal history of nicotine dependence: Secondary | ICD-10-CM | POA: Insufficient documentation

## 2016-02-15 DIAGNOSIS — Z7901 Long term (current) use of anticoagulants: Secondary | ICD-10-CM

## 2016-02-15 DIAGNOSIS — Z8249 Family history of ischemic heart disease and other diseases of the circulatory system: Secondary | ICD-10-CM | POA: Diagnosis not present

## 2016-02-15 DIAGNOSIS — Z95811 Presence of heart assist device: Secondary | ICD-10-CM | POA: Insufficient documentation

## 2016-02-15 DIAGNOSIS — Z79899 Other long term (current) drug therapy: Secondary | ICD-10-CM | POA: Insufficient documentation

## 2016-02-15 DIAGNOSIS — I110 Hypertensive heart disease with heart failure: Secondary | ICD-10-CM | POA: Diagnosis not present

## 2016-02-15 DIAGNOSIS — Z7982 Long term (current) use of aspirin: Secondary | ICD-10-CM | POA: Diagnosis not present

## 2016-02-15 DIAGNOSIS — I5022 Chronic systolic (congestive) heart failure: Secondary | ICD-10-CM | POA: Insufficient documentation

## 2016-02-15 DIAGNOSIS — I428 Other cardiomyopathies: Secondary | ICD-10-CM | POA: Insufficient documentation

## 2016-02-15 LAB — BASIC METABOLIC PANEL
Anion gap: 8 (ref 5–15)
BUN: 16 mg/dL (ref 6–20)
CO2: 25 mmol/L (ref 22–32)
Calcium: 9.1 mg/dL (ref 8.9–10.3)
Chloride: 106 mmol/L (ref 101–111)
Creatinine, Ser: 0.9 mg/dL (ref 0.61–1.24)
GFR calc Af Amer: >60 mL/min (ref >60)
GFR calc non Af Amer: >60 mL/min (ref >60)
Glucose, Bld: 79 mg/dL (ref 65–99)
Potassium: 4 mmol/L (ref 3.5–5.1)
Sodium: 139 mmol/L (ref 135–145)

## 2016-02-15 LAB — CBC
HCT: 38.5 % — ABNORMAL LOW (ref 39.0–52.0)
Hemoglobin: 13.2 g/dL (ref 13.0–17.0)
MCH: 30.8 pg (ref 26.0–34.0)
MCHC: 34.3 g/dL (ref 30.0–36.0)
MCV: 89.7 fL (ref 78.0–100.0)
Platelets: 136 10*3/uL — ABNORMAL LOW (ref 150–400)
RBC: 4.29 MIL/uL (ref 4.22–5.81)
RDW: 13.5 % (ref 11.5–15.5)
WBC: 4.2 10*3/uL (ref 4.0–10.5)

## 2016-02-15 LAB — LACTATE DEHYDROGENASE: LDH: 177 U/L (ref 98–192)

## 2016-02-15 NOTE — Progress Notes (Addendum)
Patient presents for 2 month follow up in [facility] today. Reports no problems with VAD equipment or concerns with drive line. RHC done at [facility] recently. Complaining of ICD alarming every morning - Patrick Humphrey reprogrammed to delay alarms as he will hopefully no longer need device with upcoming transplant status.   Vital Signs:  Doppler Pressure: 84 Automatc BP: 114/72 (86) HR:  95 SPO2: 99 %  Weight: 184.38 lb w/ eqt Last weight: 183.8 lb Home weights: same as above lbs BMI today 25.7 today  VAD Indication: [employer] - Evaluation completed at [facility] last week. Not yet listed.   VAD interrogation & Equipment Management (Reviewed with Dr. Aundra Dubin): Speed: 9200 Flow: 5.0 Power: 5.4 w    PI: 6.4  Alarms: no clinical alarms  Events:occasional   Fixed speed: 9200 Low speed limit: 8600  Primary Controller:  Replace back up battery in 22 months. Back up controller:   Replace back up battery in 21 months.  Annual Equipment Maintenance on UBC/PM was performed on January 2017.   I reviewed the LVAD parameters from today and compared the results to the patient's prior recorded data and reviewed with Dr. Aundra Dubin. LVAD interrogation was NEGATIVE for significant power changes, NEGATIVE for clinical alarms and STABLE for PI events/speed drops. No programming changes were made and pump is functioning within specified parameters. Pt is performing daily controller and system monitor self tests along with completing weekly and monthly maintenance for LVAD equipment.  LVAD equipment check completed and is in good working order. Back-up equipment present. Charged back up battery and performed self-test on equipment.   Exit Site Care: Drive line is being maintained weekly by his wife Patrick Humphrey. Drive line exit site well healed and incorporated. The velour is fully implanted at exit site. Dressing dry and intact. No erythema or drainage. Stabilization device present and accurately applied. Pt  denies fever or chills. Pt states they have adequate dressing supplies at home.   Significant Events on VAD Support:  none  Device: Medtronic SL Therapies: VF on @ 207, VT monitor Last check: 09/2015  BP & Labs:  MAP 84 - Doppler is reflecting MAP  Hgb 13.2 - No S/S of bleeding. Specifically denies melena/BRBPR or nosebleeds.  LDH stable at 177 with established baseline of 175- 200. Denies tea-colored urine. No power elevations noted on interrogation.   Annual Equipment Maintenance next visit with [facility]. Echo due 06/2016 per standard of care.   Janene Madeira, RN VAD Coordinator   Office: [phone] 24/7 Emergency VAD Pager: [phone]   Patient ID: Patrick Humphrey, male   DOB: 1965-09-06, 50 y.o.   MRN: [id number]    VAD CLINIC NOTE PCP: Dr Manuella Ghazi [facility] Cardiology:  Dr. Gwenlyn Found HF Cardiology: Dr. Aundra Dubin  HPI: 50 yo with h/o systolic HF due to nonischemic cardiomyopathy EF 20-25% now s/p HM II LVAD on 05/10/15. Has Medtronic ICD.  He was admitted in 1/17 with acute on chronic systolic CHF and was found to be in cardiogenic shock.  Coronary angiography showed no significant CAD.  IABP was placed.  He underwent Heartmate II LVAD placement on 05/10/15.  Post-op course was complicated by atrial flutter, then atrial fibrillation.  He required TEE-guided DCCV.  He had RV failure and was on milrinone for a prolonged time but was able to titrate off and onto Revatio.  He is now off amiodarone.   He returns for followup.  He continues to feel good. Doing anything he wants. Going to gym, working outside.  Compliant with meds.  No orthopnea, PND or edema. No fevers, chills. No need for diuretics.      Denies LVAD alarms.  Denies driveline trauma, erythema or drainage.  Denies ICD shocks.    Reports taking Coumadin as prescribed and adherence to anticoagulation based dietary restrictions.  Denies bright red blood per rectum or melena, no dark urine or hematuria.  Rare PI events.     He was seen at [facility] for transplant evaluation recently.  Hopefully will be listed soon.  RHC was done there and showed normal filling pressures and no pulmonary hypertension.   VAD interrogation & Equipment Management: See LVAD nurse's note above.  Labs (2/17): BNP 246, K 3.8, creatinine 0.72, AST 85, tbili 3, LDH 260, hemoglobin 10.3 Labs (3/17): K 4.2, creatinine 0.73, HCT 31.1, TSH 7.21 (elevated) Labs (4/17): creatinine 0.89, HCT 34, LDH 203, TSH elevated but free T3 and free T4 normal.  Labs (5/17): LFTs normal Labs (6/17): creatinine 1.03 Labs (7/17): HCT 36 Labs (8/17): LDH 182, K 4, creatinine 0.94  RHC (11/17 at [facility]): RA 4, PCWP 11, PVR 1.1 WU, CI 3  Past Medical History:  Diagnosis Date  . AICD (automatic cardioverter/defibrillator) present   . CHF (congestive heart failure) ([place])   . Hx of echocardiogram 11/09/1910   Showed an EF of 25% with no significant valve disease.  . ICD (implantable cardioverter-defibrillator), single, in situ 08/26/2007  . Nonischemic cardiomyopathy ([place])   . Presence of permanent cardiac pacemaker    Social History   Social History  . Marital status: Married    Spouse name: N/A  . Number of children: N/A  . Years of education: N/A   Occupational History  . Not on file.   Social History Main Topics  . Smoking status: Former Smoker    Quit date: 02/20/1998  . Smokeless tobacco: Not on file  . Alcohol use 0.0 oz/week     Comment: has quit drinking x 1 1/2 months. He sporadically drinks  . Drug use: No  . Sexual activity: Yes   Other Topics Concern  . Not on file   Social History Narrative  . No narrative on file   Family History  Problem Relation Age of Onset  . Hypertension Mother   . Hypertension Father   . Cancer - Prostate Maternal Grandfather     Current Outpatient Prescriptions  Medication Sig Dispense Refill  . ascorbic acid (VITAMIN C) 500 MG tablet Take 500 mg by mouth daily.    Marland Kitchen aspirin EC 81 MG tablet  Take 81 mg by mouth daily.    . Biotin 1000 MCG tablet Take 5,000 mcg by mouth daily.     . cetirizine (ZYRTEC) 10 MG tablet Take 10 mg by mouth daily.    . clindamycin (CLEOCIN) 300 MG capsule Take 2 capsules (600 mg total) by mouth as needed (Take 600 mg one hour prior to any dental procedures.). 12 capsule 2  . co-enzyme Q-10 30 MG capsule Take 100 mg by mouth daily.     Marland Kitchen docusate sodium (COLACE) 100 MG capsule Take 2 capsules (200 mg total) by mouth daily as needed for mild constipation. (Patient taking differently: Take 200 mg by mouth daily. ) 10 capsule 6  . fluticasone (FLONASE) 50 MCG/ACT nasal spray Place 1 spray into both nostrils daily.    . furosemide (LASIX) 20 MG tablet Take 1 tablet (20 mg total) by mouth daily as needed (weight gain). 30 tablet 3  . LORazepam (ATIVAN)  1 MG tablet Take 1 tablet (1 mg total) by mouth at bedtime. 30 tablet 3  . losartan (COZAAR) 25 MG tablet Take 0.5 tablets (12.5 mg total) by mouth daily. 90 tablet 3  . montelukast (SINGULAIR) 10 MG tablet Take 1 tablet (10 mg total) by mouth at bedtime. 90 tablet 3  . Omega-3 Fatty Acids (FISH OIL) 1000 MG CAPS Take 2,100 mg by mouth daily.     . ranitidine (ZANTAC) 150 MG capsule Take 150 mg by mouth every evening.    . sildenafil (REVATIO) 20 MG tablet Take 2 tablets (40 mg total) by mouth 3 (three) times daily. 180 tablet 6  . spironolactone (ALDACTONE) 25 MG tablet Take 1 tablet (25 mg total) by mouth daily. 90 tablet 3  . warfarin (COUMADIN) 4 MG tablet Take 4 mg by mouth daily. Except mondays    . warfarin (COUMADIN) 6 MG tablet Take 6 mg by mouth once a week. monday     No current facility-administered medications for this encounter.     Penicillins and Sulfa antibiotics  REVIEW OF SYSTEMS: All systems negative except as listed in HPI, PMH and Problem list.   Vital Signs:  BP (!) 84/0   Pulse 95   Resp 18   Ht 5\' 11"  (1.803 m)   Wt 184 lb 6 oz (83.6 kg)   SpO2 99%   BMI 25.72 kg/m   MAP  85  Physical Exam: GENERAL: Well appearing, male who presents to clinic today in no acute distress. HEENT: normal  NECK: Supple, JVP  flat  2+ bilaterally, no bruits.  No lymphadenopathy or thyromegaly appreciated.   CARDIAC:  Mechanical heart sounds with LVAD hum present.  LUNGS:  Clear to auscultation bilaterally.  ABDOMEN:  Soft,  nontender, nondistended positive bowel sounds x4.     LVAD exit site: well-healed and incorporated.  Dressing dry and intact.  No erythema or drainage.  Stabilization device present and accurately applied.  Driveline dressing is being changed daily per sterile technique. EXTREMITIES:  Warm and dry, no cyanosis, clubbing, rash or edema  NEUROLOGIC:  Alert and oriented x 4.  Gait steady.  No aphasia.  No dysarthria.  Affect pleasant.     ASSESSMENT AND PLAN:   1. Status post Heartmate II LVAD implantation 1/17:  Patient comes to clinic today for routine follow up visit. Currently has NYHA class 1 symptoms on LVAD support. In hospital, he had RV failure.  MAP stable.  Few PI events.  - He will stay off Lasix.   - Continue Revatio 40 mg tid for RV support.  - Exercising on his own.  - Undergoing transplant evaluation at [facility]. Blood type A+ - VAD parameters ok. 2.  Anticoagulation management: INR goal 2.0-2.5.  He will also continue ASA 81 daily.  No evidence for overt bleeding.  - Check INR today.  3. Chronic systolic CHF: Nonischemic cardiomyopathy.   - Continue losartan 12.5 mg daily, spironolactone 25 daily.  4. Atrial fibrillation/flutter: He remains in NSR today.  He only had atrial fibrillation/flutter post-op. He is now off amiodarone.  - Continue warfarin.  5.  ICD at EOL: He has decided against generator change. 7. HTN: controlled.   [name] MD 02/15/2016

## 2016-02-15 NOTE — Patient Instructions (Signed)
No medication changes today.   Back to see [place] in 2 months.

## 2016-02-22 ENCOUNTER — Ambulatory Visit (HOSPITAL_COMMUNITY): Payer: Self-pay | Admitting: Pharmacist

## 2016-02-22 DIAGNOSIS — Z95811 Presence of heart assist device: Secondary | ICD-10-CM

## 2016-02-22 LAB — POCT INR: INR: 1.9

## 2016-02-29 ENCOUNTER — Ambulatory Visit (HOSPITAL_COMMUNITY): Payer: Self-pay | Admitting: Pharmacist

## 2016-02-29 DIAGNOSIS — Z95811 Presence of heart assist device: Secondary | ICD-10-CM

## 2016-02-29 LAB — POCT INR: INR: 2.7

## 2016-03-06 ENCOUNTER — Encounter (HOSPITAL_COMMUNITY): Payer: Self-pay | Admitting: *Deleted

## 2016-03-06 ENCOUNTER — Telehealth (HOSPITAL_COMMUNITY): Payer: Self-pay | Admitting: *Deleted

## 2016-03-06 LAB — POCT INR: INR: 2.3

## 2016-03-06 NOTE — Telephone Encounter (Signed)
Pt's wife called requesting a letter to get pt excused for jury duty.  Letter completed and signed by Dr Aundra Dubin, faxed to [phone], pt's wife aware

## 2016-03-07 ENCOUNTER — Ambulatory Visit (HOSPITAL_COMMUNITY): Payer: Self-pay | Admitting: Pharmacist

## 2016-03-07 DIAGNOSIS — Z95811 Presence of heart assist device: Secondary | ICD-10-CM

## 2016-03-09 ENCOUNTER — Other Ambulatory Visit (HOSPITAL_COMMUNITY): Payer: Self-pay | Admitting: Cardiology

## 2016-03-09 DIAGNOSIS — I5022 Chronic systolic (congestive) heart failure: Secondary | ICD-10-CM

## 2016-03-09 DIAGNOSIS — Z889 Allergy status to unspecified drugs, medicaments and biological substances status: Secondary | ICD-10-CM

## 2016-03-09 DIAGNOSIS — Z95811 Presence of heart assist device: Secondary | ICD-10-CM

## 2016-03-14 ENCOUNTER — Ambulatory Visit (HOSPITAL_COMMUNITY): Payer: Self-pay | Admitting: Pharmacist

## 2016-03-14 DIAGNOSIS — Z95811 Presence of heart assist device: Secondary | ICD-10-CM

## 2016-03-14 LAB — POCT INR: INR: 1.9

## 2016-03-20 LAB — POCT INR: INR: 2.2

## 2016-03-21 ENCOUNTER — Ambulatory Visit (HOSPITAL_COMMUNITY): Payer: Self-pay | Admitting: Pharmacist

## 2016-03-21 DIAGNOSIS — Z95811 Presence of heart assist device: Secondary | ICD-10-CM

## 2016-03-27 LAB — POCT INR: INR: 2.3

## 2016-03-28 ENCOUNTER — Ambulatory Visit (HOSPITAL_COMMUNITY): Payer: Self-pay | Admitting: Pharmacist

## 2016-03-28 DIAGNOSIS — Z95811 Presence of heart assist device: Secondary | ICD-10-CM

## 2016-04-04 LAB — POCT INR: INR: 2.9

## 2016-04-05 ENCOUNTER — Ambulatory Visit (HOSPITAL_COMMUNITY): Payer: Self-pay | Admitting: Unknown Physician Specialty

## 2016-04-05 DIAGNOSIS — Z95811 Presence of heart assist device: Secondary | ICD-10-CM

## 2016-04-10 ENCOUNTER — Ambulatory Visit (HOSPITAL_COMMUNITY): Payer: Self-pay | Admitting: Pharmacist

## 2016-04-10 DIAGNOSIS — Z95811 Presence of heart assist device: Secondary | ICD-10-CM

## 2016-04-10 LAB — POCT INR: INR: 3

## 2016-04-17 ENCOUNTER — Encounter (HOSPITAL_COMMUNITY): Payer: Medicare Other

## 2016-07-12 ENCOUNTER — Encounter (HOSPITAL_COMMUNITY)
Admission: RE | Admit: 2016-07-12 | Discharge: 2016-07-12 | Disposition: A | Discharge status: Home / Self Care | Payer: Medicare Other | Source: Ambulatory Visit | Attending: Cardiology | Admitting: Cardiology

## 2016-07-12 ENCOUNTER — Encounter (HOSPITAL_COMMUNITY): Payer: Self-pay

## 2016-07-12 VITALS — BP 126/64 | HR 92 | Ht 71.0 in | Wt 174.8 lb

## 2016-07-12 DIAGNOSIS — Z941 Heart transplant status: Secondary | ICD-10-CM | POA: Diagnosis present

## 2016-07-12 DIAGNOSIS — Z87891 Personal history of nicotine dependence: Secondary | ICD-10-CM | POA: Diagnosis not present

## 2016-07-12 DIAGNOSIS — Z7951 Long term (current) use of inhaled steroids: Secondary | ICD-10-CM | POA: Insufficient documentation

## 2016-07-12 DIAGNOSIS — Z79899 Other long term (current) drug therapy: Secondary | ICD-10-CM | POA: Insufficient documentation

## 2016-07-12 NOTE — Progress Notes (Signed)
Cardiac/Pulmonary Rehab Medication Review by a Pharmacist  Does the patient  feel that his/her medications are working for him/her?  yes  Has the patient been experiencing any side effects to the medications prescribed?  yes  Does the patient measure his/her own blood pressure or blood glucose at home?  yes   Does the patient have any problems obtaining medications due to transportation or finances?   no  Understanding of regimen: good Understanding of indications: good Potential of compliance: excellent  Questions asked to Determine Patient Understanding of Medication Regimen:  1. What is the name of the medication?  2. What is the medication used for?  3. When should it be taken?  4. How much should be taken?  5. How will you take it?  6. What side effects should you report?  Understanding Defined as: Excellent: All questions above are correct Good: Questions 1-4 are correct Fair: Questions 1-2 are correct  Poor: 1 or none of the above questions are correct   Pharmacist comments: Pt states he occasionally has shaking and headaches from transplant medications.  States the Prednisone causes him to have some acne and bumps.  Patient does check BP at home.    Hart Robinsons A 07/12/2016 1:35 PM

## 2016-07-12 NOTE — Progress Notes (Signed)
Cardiac Individual Treatment Plan  Patient Details  Name: Patrick Humphrey MRN: [id number] Date of Birth: [birth year] Referring Provider:     [facility] from 07/12/2016 in [place]  Referring Provider  Dr. Mosetta Pigeon      Initial Encounter Date:    CARDIAC REHAB PHASE II ORIENTATION from 07/12/2016 in [place]  Date  07/12/16  Referring Provider  Dr. Mosetta Pigeon      Visit Diagnosis: H/O heart transplant [facility])  Patient's Home Medications on Admission:  Current Outpatient Prescriptions:  .  acetaminophen (TYLENOL) 325 MG tablet, Take 650 mg by mouth every 6 (six) hours as needed for mild pain., Disp: , Rfl:  .  amLODipine (NORVASC) 10 MG tablet, Take 10 mg by mouth daily., Disp: , Rfl:  .  Calcium Carbonate-Vitamin D (CALTRATE 600+D) 600-400 MG-UNIT tablet, Take 1 tablet by mouth 2 (two) times daily., Disp: , Rfl:  .  ferrous sulfate 325 (65 FE) MG tablet, Take 325 mg by mouth daily with breakfast., Disp: , Rfl:  .  folic acid (FOLVITE) 1 MG tablet, Take 1 mg by mouth daily., Disp: , Rfl:  .  magnesium oxide (MAG-OX) 400 MG tablet, Take 400 mg by mouth 2 (two) times daily., Disp: , Rfl:  .  mycophenolate (CELLCEPT) 500 MG tablet, Take 1,000 mg by mouth., Disp: , Rfl:  .  oxyCODONE (OXY IR/ROXICODONE) 5 MG immediate release tablet, Take 5 mg by mouth every 4 (four) hours as needed for severe pain., Disp: , Rfl:  .  pantoprazole (PROTONIX) 40 MG tablet, Take 40 mg by mouth daily., Disp: , Rfl:  .  pravastatin (PRAVACHOL) 20 MG tablet, Take 20 mg by mouth daily at 6 PM., Disp: , Rfl:  .  predniSONE (DELTASONE) 5 MG tablet, Take 5 mg by mouth daily with breakfast., Disp: , Rfl:  .  tacrolimus (PROGRAF) 5 MG capsule, Take 7 mg by mouth 2 (two) times daily. Take with 1mg  doses to make 7mg  BID total, Disp: , Rfl:  .  valACYclovir (VALTREX) 500 MG tablet, Take 500 mg by mouth 2 (two) times daily., Disp: , Rfl:  .  aspirin EC 81  MG tablet, Take 81 mg by mouth daily., Disp: , Rfl:  .  cetirizine (ZYRTEC) 10 MG tablet, Take 10 mg by mouth daily., Disp: , Rfl:  .  docusate sodium (COLACE) 100 MG capsule, Take 2 capsules (200 mg total) by mouth daily as needed for mild constipation. (Patient taking differently: Take 200 mg by mouth daily. ), Disp: 10 capsule, Rfl: 6 .  fluticasone (FLONASE) 50 MCG/ACT nasal spray, Place 1 spray into both nostrils daily., Disp: , Rfl:   Past Medical History: Past Medical History:  Diagnosis Date  . AICD (automatic cardioverter/defibrillator) present   . CHF (congestive heart failure) ([place])   . Hx of echocardiogram 11/09/1910   Showed an EF of 25% with no significant valve disease.  . ICD (implantable cardioverter-defibrillator), single, in situ 08/26/2007  . Nonischemic cardiomyopathy ([place])   . Presence of permanent cardiac pacemaker     Tobacco Use: History  Smoking Status  . Former Smoker  . Packs/day: 1.00  . Quit date: 02/20/1998  Smokeless Tobacco  . Never Used    Labs: Recent Review Flowsheet Data    Labs for ITP Cardiac and Pulmonary Rehab Latest Ref Rng & Units 05/26/2015 05/27/2015 05/28/2015 05/29/2015 05/30/2015   Cholestrol 0 - 200 mg/dL - - - - -   LDLCALC  0 - 99 mg/dL - - - - -   HDL >40 mg/dL - - - - -   Trlycerides <150 mg/dL - - - - -   Hemoglobin A1c 4.8 - 5.6 % - - - - -   PHART 7.350 - 7.450 - - - - -   PCO2ART 35.0 - 45.0 mmHg - - - - -   HCO3 20.0 - 24.0 mEq/L - - - - -   TCO2 0 - 100 mmol/L - - - - -   ACIDBASEDEF 0.0 - 2.0 mmol/L - - - - -   O2SAT % 57.2 60.6 67.4 64.5 73.0      Capillary Blood Glucose: Lab Results  Component Value Date   GLUCAP 94 05/28/2015   GLUCAP 107 (H) 05/18/2015   GLUCAP 102 (H) 05/14/2015   GLUCAP 77 05/14/2015   GLUCAP 103 (H) 05/14/2015     Exercise Target Goals: Date: 07/12/16  Exercise Program Goal: Individual exercise prescription set with THRR, safety & activity barriers. Participant demonstrates ability  to understand and report RPE using BORG scale, to self-measure pulse accurately, and to acknowledge the importance of the exercise prescription.  Exercise Prescription Goal: Starting with aerobic activity 30 plus minutes a day, 3 days per week for initial exercise prescription. Provide home exercise prescription and guidelines that participant acknowledges understanding prior to discharge.  Activity Barriers & Risk Stratification:   6 Minute Walk:     6 Minute Walk    Row Name 07/12/16 1506         6 Minute Walk   Phase Initial     Distance 1600 feet     Distance % Change 0 %     Walk Time 6 minutes     # of Rest Breaks 0     MPH 3.03     METS 3.32     RPE 7     Perceived Dyspnea  7     VO2 Peak 17.09     Symptoms No     Resting HR 92 bpm     Resting BP 126/64     Max Ex. HR 100 bpm     Max Ex. BP 152/82     2 Minute Post BP 140/76        Oxygen Initial Assessment:   Oxygen Re-Evaluation:   Oxygen Discharge (Final Oxygen Re-Evaluation):   Initial Exercise Prescription:     Initial Exercise Prescription - 07/12/16 1500      Date of Initial Exercise RX and Referring Provider   Date 07/12/16   Referring Provider Dr. Mosetta Pigeon     Treadmill   MPH 2.3   Grade 0   Minutes 15   METs 2.7     Recumbant Elliptical   Level 1   RPM 38   Watts 44   Minutes 20   METs 2.5     Prescription Details   Frequency (times per week) 3   Duration Progress to 30 minutes of continuous aerobic without signs/symptoms of physical distress     Intensity   THRR 40-80% of Max Heartrate [phone]   Ratings of Perceived Exertion 11-13   Perceived Dyspnea 0-4     Progression   Progression Continue progressive overload as per policy without signs/symptoms or physical distress.     Resistance Training   Training Prescription Yes   Weight 1   Reps 10-15      Perform Capillary Blood Glucose checks as needed.  Exercise Prescription Changes:  Exercise  Comments:   Exercise Goals and Review:      Exercise Goals    Row Name 07/12/16 1523             Exercise Goals   Increase Physical Activity Yes       Intervention Provide advice, education, support and counseling about physical activity/exercise needs.;Develop an individualized exercise prescription for aerobic and resistive training based on initial evaluation findings, risk stratification, comorbidities and participant's personal goals.       Expected Outcomes Achievement of increased cardiorespiratory fitness and enhanced flexibility, muscular endurance and strength shown through measurements of functional capacity and personal statement of participant.       Increase Strength and Stamina Yes       Intervention Develop an individualized exercise prescription for aerobic and resistive training based on initial evaluation findings, risk stratification, comorbidities and participant's personal goals.;Provide advice, education, support and counseling about physical activity/exercise needs.       Expected Outcomes Achievement of increased cardiorespiratory fitness and enhanced flexibility, muscular endurance and strength shown through measurements of functional capacity and personal statement of participant.          Exercise Goals Re-Evaluation :    Discharge Exercise Prescription (Final Exercise Prescription Changes):   Nutrition:  Target Goals: Understanding of nutrition guidelines, daily intake of sodium 1500mg , cholesterol 200mg , calories 30% from fat and 7% or less from saturated fats, daily to have 5 or more servings of fruits and vegetables.  Biometrics:     Pre Biometrics - 07/12/16 1507      Pre Biometrics   Height 5\' 11"  (1.803 m)   Weight 174 lb 13.2 oz (79.3 kg)   Waist Circumference 33 inches   Hip Circumference 38 inches   Waist to Hip Ratio 0.87 %   BMI (Calculated) 24.4   Triceps Skinfold 10 mm   % Body Fat 20.6 %   Grip Strength 92.33 kg    Flexibility 20.17 in   Single Leg Stand 60 seconds       Nutrition Therapy Plan and Nutrition Goals:   Nutrition Discharge: Rate Your Plate Scores:     Nutrition Assessments - 07/12/16 1523      MEDFICTS Scores   Pre Score 45      Nutrition Goals Re-Evaluation:   Nutrition Goals Discharge (Final Nutrition Goals Re-Evaluation):   Psychosocial: Target Goals: Acknowledge presence or absence of significant depression and/or stress, maximize coping skills, provide positive support system. Participant is able to verbalize types and ability to use techniques and skills needed for reducing stress and depression.  Initial Review & Psychosocial Screening:     Initial Psych Review & Screening - 07/12/16 1542      Initial Review   Current issues with None Identified     Family Dynamics   Good Support System? Yes     Barriers   Psychosocial barriers to participate in program Psychosocial barriers identified (see note)     Screening Interventions   Interventions Encouraged to exercise      Quality of Life Scores:     Quality of Life - 07/12/16 1508      Quality of Life Scores   Health/Function Pre 26.57 %   Socioeconomic Pre 25.2 %   Psych/Spiritual Pre 28.29 %   Family Pre 22.8 %   GLOBAL Pre 26.13 %      PHQ-9: Recent Review Flowsheet Data    Depression screen [facility] 2/9 07/12/2016   Decreased Interest 0  Down, Depressed, Hopeless 0   PHQ - 2 Score 0   Altered sleeping 0   Tired, decreased energy 0   Change in appetite 0   Feeling bad or failure about yourself  0   Trouble concentrating 0   Moving slowly or fidgety/restless 0   Suicidal thoughts 0   PHQ-9 Score 0     Interpretation of Total Score  Total Score Depression Severity:  1-4 = Minimal depression, 5-9 = Mild depression, 10-14 = Moderate depression, 15-19 = Moderately severe depression, 20-27 = Severe depression   Psychosocial Evaluation and Intervention:     Psychosocial Evaluation -  07/12/16 1544      Psychosocial Evaluation & Interventions   Interventions Encouraged to exercise with the program and follow exercise prescription   Continue Psychosocial Services  No Follow up required      Psychosocial Re-Evaluation:   Psychosocial Discharge (Final Psychosocial Re-Evaluation):   Vocational Rehabilitation: Provide vocational rehab assistance to qualifying candidates.   Vocational Rehab Evaluation & Intervention:     Vocational Rehab - 07/12/16 1520      Initial Vocational Rehab Evaluation & Intervention   Assessment shows need for Vocational Rehabilitation No      Education: Education Goals: Education classes will be provided on a weekly basis, covering required topics. Participant will state understanding/return demonstration of topics presented.  Learning Barriers/Preferences:     Learning Barriers/Preferences - 07/12/16 1515      Learning Barriers/Preferences   Learning Barriers None   Learning Preferences Video;Group Instruction;Skilled Demonstration;Pictoral      Education Topics: Hypertension, Hypertension Reduction -Define heart disease and high blood pressure. Discus how high blood pressure affects the body and ways to reduce high blood pressure.   Exercise and Your Heart -Discuss why it is important to exercise, the FITT principles of exercise, normal and abnormal responses to exercise, and how to exercise safely.   Angina -Discuss definition of angina, causes of angina, treatment of angina, and how to decrease risk of having angina.   Cardiac Medications -Review what the following cardiac medications are used for, how they affect the body, and side effects that may occur when taking the medications.  Medications include Aspirin, Beta blockers, calcium channel blockers, ACE Inhibitors, angiotensin receptor blockers, diuretics, digoxin, and antihyperlipidemics.   Congestive Heart Failure -Discuss the definition of CHF, how to live  with CHF, the signs and symptoms of CHF, and how keep track of weight and sodium intake.   Heart Disease and Intimacy -Discus the effect sexual activity has on the heart, how changes occur during intimacy as we age, and safety during sexual activity.   Smoking Cessation / COPD -Discuss different methods to quit smoking, the health benefits of quitting smoking, and the definition of COPD.   Nutrition I: Fats -Discuss the types of cholesterol, what cholesterol does to the heart, and how cholesterol levels can be controlled.   Nutrition II: Labels -Discuss the different components of food labels and how to read food label   Heart Parts and Heart Disease -Discuss the anatomy of the heart, the pathway of blood circulation through the heart, and these are affected by heart disease.   Stress I: Signs and Symptoms -Discuss the causes of stress, how stress may lead to anxiety and depression, and ways to limit stress.   Stress II: Relaxation -Discuss different types of relaxation techniques to limit stress.   Warning Signs of Stroke / TIA -Discuss definition of a stroke, what the signs and symptoms are  of a stroke, and how to identify when someone is having stroke.   Knowledge Questionnaire Score:     Knowledge Questionnaire Score - 07/12/16 1520      Knowledge Questionnaire Score   Pre Score 22/24      Core Components/Risk Factors/Patient Goals at Admission:     Personal Goals and Risk Factors at Admission - 07/12/16 1523      Core Components/Risk Factors/Patient Goals on Admission    Weight Management Weight Maintenance   Personal Goal Other Yes   Personal Goal Get stronger and to learn what his exercise perameters are to maintain a healthly lifestyle.    Intervention Attend CR 3 x week and supplement exercise at home 2 x week.    Expected Outcomes Achieve personal goals.       Core Components/Risk Factors/Patient Goals Review:    Core Components/Risk  Factors/Patient Goals at Discharge (Final Review):    ITP Comments:     ITP Comments    Row Name 07/12/16 1521           ITP Comments Mr. Piercefield is a 51 year old male that recently had a Heart Transplant. he is doing very well.           Comments: Patient arrived for 1st visit/orientation/education at 1230. Patient was referred to CR by Dr. Melvyn Novas due to Heart Transplant (Z94.1). During orientation advised patient on arrival and appointment times what to wear, what to do before, during and after exercise. Reviewed attendance and class policy. Talked about inclement weather and class consultation policy. Pt is scheduled to return Cardiac Rehab on 07/16/16 at 11:00. Pt was advised to come to class 15 minutes before class starts. Patient was also given instructions on meeting with the dietician and attending the Family Structure classes. Patient declined meeting with dietician and family structure due to attending these classes at [facility]. Pt is eager to get started. Patient participated in warm up stretches followed by light weight training and resistance bands. Patient was then able to complete 6 minute walk test. There were no abnormal S/S. Patient was measured for the equipment. Discussed equipment safety with patient. Took patient pre-anthropometric measurements. Patient finished visit at 1430.

## 2016-07-13 NOTE — Progress Notes (Signed)
Cardiac Individual Treatment Plan  Patient Details  Name: Patrick Humphrey MRN: [id number] Date of Birth: Aug 23, 1965 Referring Provider:     [place] from 07/12/2016 in [place]  Referring Provider  Dr. Mosetta Pigeon      Initial Encounter Date:    CARDIAC REHAB PHASE II ORIENTATION from 07/12/2016 in [place]  Date  07/12/16  Referring Provider  Dr. Mosetta Pigeon      Visit Diagnosis: H/O heart transplant [facility])  Patient's Home Medications on Admission:  Current Outpatient Prescriptions:  .  acetaminophen (TYLENOL) 325 MG tablet, Take 650 mg by mouth every 6 (six) hours as needed for mild pain., Disp: , Rfl:  .  amLODipine (NORVASC) 10 MG tablet, Take 10 mg by mouth daily., Disp: , Rfl:  .  Calcium Carbonate-Vitamin D (CALTRATE 600+D) 600-400 MG-UNIT tablet, Take 1 tablet by mouth 2 (two) times daily., Disp: , Rfl:  .  ferrous sulfate 325 (65 FE) MG tablet, Take 325 mg by mouth daily with breakfast., Disp: , Rfl:  .  folic acid (FOLVITE) 1 MG tablet, Take 1 mg by mouth daily., Disp: , Rfl:  .  magnesium oxide (MAG-OX) 400 MG tablet, Take 400 mg by mouth 2 (two) times daily., Disp: , Rfl:  .  mycophenolate (CELLCEPT) 500 MG tablet, Take 1,000 mg by mouth., Disp: , Rfl:  .  oxyCODONE (OXY IR/ROXICODONE) 5 MG immediate release tablet, Take 5 mg by mouth every 4 (four) hours as needed for severe pain., Disp: , Rfl:  .  pantoprazole (PROTONIX) 40 MG tablet, Take 40 mg by mouth daily., Disp: , Rfl:  .  pravastatin (PRAVACHOL) 20 MG tablet, Take 20 mg by mouth daily at 6 PM., Disp: , Rfl:  .  predniSONE (DELTASONE) 5 MG tablet, Take 5 mg by mouth daily with breakfast., Disp: , Rfl:  .  tacrolimus (PROGRAF) 5 MG capsule, Take 7 mg by mouth 2 (two) times daily. Take with 1mg  doses to make 7mg  BID total, Disp: , Rfl:  .  valACYclovir (VALTREX) 500 MG tablet, Take 500 mg by mouth 2 (two) times daily., Disp: , Rfl:  .  aspirin EC 81  MG tablet, Take 81 mg by mouth daily., Disp: , Rfl:  .  cetirizine (ZYRTEC) 10 MG tablet, Take 10 mg by mouth daily., Disp: , Rfl:  .  docusate sodium (COLACE) 100 MG capsule, Take 2 capsules (200 mg total) by mouth daily as needed for mild constipation. (Patient taking differently: Take 200 mg by mouth daily. ), Disp: 10 capsule, Rfl: 6 .  fluticasone (FLONASE) 50 MCG/ACT nasal spray, Place 1 spray into both nostrils daily., Disp: , Rfl:   Past Medical History: Past Medical History:  Diagnosis Date  . AICD (automatic cardioverter/defibrillator) present   . CHF (congestive heart failure) ([place])   . Hx of echocardiogram 11/09/1910   Showed an EF of 25% with no significant valve disease.  . ICD (implantable cardioverter-defibrillator), single, in situ 08/26/2007  . Nonischemic cardiomyopathy ([place])   . Presence of permanent cardiac pacemaker     Tobacco Use: History  Smoking Status  . Former Smoker  . Packs/day: 1.00  . Quit date: 02/20/1998  Smokeless Tobacco  . Never Used    Labs: Recent Review Flowsheet Data    Labs for ITP Cardiac and Pulmonary Rehab Latest Ref Rng & Units 05/26/2015 05/27/2015 05/28/2015 05/29/2015 05/30/2015   Cholestrol 0 - 200 mg/dL - - - - -   LDLCALC  0 - 99 mg/dL - - - - -   HDL >40 mg/dL - - - - -   Trlycerides <150 mg/dL - - - - -   Hemoglobin A1c 4.8 - 5.6 % - - - - -   PHART 7.350 - 7.450 - - - - -   PCO2ART 35.0 - 45.0 mmHg - - - - -   HCO3 20.0 - 24.0 mEq/L - - - - -   TCO2 0 - 100 mmol/L - - - - -   ACIDBASEDEF 0.0 - 2.0 mmol/L - - - - -   O2SAT % 57.2 60.6 67.4 64.5 73.0      Capillary Blood Glucose: Lab Results  Component Value Date   GLUCAP 94 05/28/2015   GLUCAP 107 (H) 05/18/2015   GLUCAP 102 (H) 05/14/2015   GLUCAP 77 05/14/2015   GLUCAP 103 (H) 05/14/2015     Exercise Target Goals: Date: 07/12/16  Exercise Program Goal: Individual exercise prescription set with THRR, safety & activity barriers. Participant demonstrates ability  to understand and report RPE using BORG scale, to self-measure pulse accurately, and to acknowledge the importance of the exercise prescription.  Exercise Prescription Goal: Starting with aerobic activity 30 plus minutes a day, 3 days per week for initial exercise prescription. Provide home exercise prescription and guidelines that participant acknowledges understanding prior to discharge.  Activity Barriers & Risk Stratification:   6 Minute Walk:     6 Minute Walk    Row Name 07/12/16 1506         6 Minute Walk   Phase Initial     Distance 1600 feet     Distance % Change 0 %     Walk Time 6 minutes     # of Rest Breaks 0     MPH 3.03     METS 3.32     RPE 7     Perceived Dyspnea  7     VO2 Peak 17.09     Symptoms No     Resting HR 92 bpm     Resting BP 126/64     Max Ex. HR 100 bpm     Max Ex. BP 152/82     2 Minute Post BP 140/76        Oxygen Initial Assessment:   Oxygen Re-Evaluation:   Oxygen Discharge (Final Oxygen Re-Evaluation):   Initial Exercise Prescription:     Initial Exercise Prescription - 07/12/16 1500      Date of Initial Exercise RX and Referring Provider   Date 07/12/16   Referring Provider Dr. Mosetta Pigeon     Treadmill   MPH 2.3   Grade 0   Minutes 15   METs 2.7     Recumbant Elliptical   Level 1   RPM 38   Watts 44   Minutes 20   METs 2.5     Prescription Details   Frequency (times per week) 3   Duration Progress to 30 minutes of continuous aerobic without signs/symptoms of physical distress     Intensity   THRR 40-80% of Max Heartrate [phone]   Ratings of Perceived Exertion 11-13   Perceived Dyspnea 0-4     Progression   Progression Continue progressive overload as per policy without signs/symptoms or physical distress.     Resistance Training   Training Prescription Yes   Weight 1   Reps 10-15      Perform Capillary Blood Glucose checks as needed.  Exercise Prescription Changes:  Exercise  Comments:   Exercise Goals and Review:     Exercise Goals    Row Name 07/12/16 1523             Exercise Goals   Increase Physical Activity Yes       Intervention Provide advice, education, support and counseling about physical activity/exercise needs.;Develop an individualized exercise prescription for aerobic and resistive training based on initial evaluation findings, risk stratification, comorbidities and participant's personal goals.       Expected Outcomes Achievement of increased cardiorespiratory fitness and enhanced flexibility, muscular endurance and strength shown through measurements of functional capacity and personal statement of participant.       Increase Strength and Stamina Yes       Intervention Develop an individualized exercise prescription for aerobic and resistive training based on initial evaluation findings, risk stratification, comorbidities and participant's personal goals.;Provide advice, education, support and counseling about physical activity/exercise needs.       Expected Outcomes Achievement of increased cardiorespiratory fitness and enhanced flexibility, muscular endurance and strength shown through measurements of functional capacity and personal statement of participant.          Exercise Goals Re-Evaluation :    Discharge Exercise Prescription (Final Exercise Prescription Changes):   Nutrition:  Target Goals: Understanding of nutrition guidelines, daily intake of sodium 1500mg , cholesterol 200mg , calories 30% from fat and 7% or less from saturated fats, daily to have 5 or more servings of fruits and vegetables.  Biometrics:     Pre Biometrics - 07/12/16 1507      Pre Biometrics   Height 5\' 11"  (1.803 m)   Weight 174 lb 13.2 oz (79.3 kg)   Waist Circumference 33 inches   Hip Circumference 38 inches   Waist to Hip Ratio 0.87 %   BMI (Calculated) 24.4   Triceps Skinfold 10 mm   % Body Fat 20.6 %   Grip Strength 92.33 kg   Flexibility  20.17 in   Single Leg Stand 60 seconds       Nutrition Therapy Plan and Nutrition Goals:   Nutrition Discharge: Rate Your Plate Scores:     Nutrition Assessments - 07/12/16 1523      MEDFICTS Scores   Pre Score 45      Nutrition Goals Re-Evaluation:   Nutrition Goals Discharge (Final Nutrition Goals Re-Evaluation):   Psychosocial: Target Goals: Acknowledge presence or absence of significant depression and/or stress, maximize coping skills, provide positive support system. Participant is able to verbalize types and ability to use techniques and skills needed for reducing stress and depression.  Initial Review & Psychosocial Screening:     Initial Psych Review & Screening - 07/12/16 1542      Initial Review   Current issues with None Identified     Family Dynamics   Good Support System? Yes     Barriers   Psychosocial barriers to participate in program Psychosocial barriers identified (see note)     Screening Interventions   Interventions Encouraged to exercise      Quality of Life Scores:     Quality of Life - 07/12/16 1508      Quality of Life Scores   Health/Function Pre 26.57 %   Socioeconomic Pre 25.2 %   Psych/Spiritual Pre 28.29 %   Family Pre 22.8 %   GLOBAL Pre 26.13 %      PHQ-9: Recent Review Flowsheet Data    Depression screen [facility] 2/9 07/12/2016   Decreased Interest 0   Down,  Depressed, Hopeless 0   PHQ - 2 Score 0   Altered sleeping 0   Tired, decreased energy 0   Change in appetite 0   Feeling bad or failure about yourself  0   Trouble concentrating 0   Moving slowly or fidgety/restless 0   Suicidal thoughts 0   PHQ-9 Score 0     Interpretation of Total Score  Total Score Depression Severity:  1-4 = Minimal depression, 5-9 = Mild depression, 10-14 = Moderate depression, 15-19 = Moderately severe depression, 20-27 = Severe depression   Psychosocial Evaluation and Intervention:     Psychosocial Evaluation - 07/12/16 1544       Psychosocial Evaluation & Interventions   Interventions Encouraged to exercise with the program and follow exercise prescription   Continue Psychosocial Services  No Follow up required      Psychosocial Re-Evaluation:   Psychosocial Discharge (Final Psychosocial Re-Evaluation):   Vocational Rehabilitation: Provide vocational rehab assistance to qualifying candidates.   Vocational Rehab Evaluation & Intervention:     Vocational Rehab - 07/12/16 1520      Initial Vocational Rehab Evaluation & Intervention   Assessment shows need for Vocational Rehabilitation No      Education: Education Goals: Education classes will be provided on a weekly basis, covering required topics. Participant will state understanding/return demonstration of topics presented.  Learning Barriers/Preferences:     Learning Barriers/Preferences - 07/12/16 1515      Learning Barriers/Preferences   Learning Barriers None   Learning Preferences Video;Group Instruction;Skilled Demonstration;Pictoral      Education Topics: Hypertension, Hypertension Reduction -Define heart disease and high blood pressure. Discus how high blood pressure affects the body and ways to reduce high blood pressure.   Exercise and Your Heart -Discuss why it is important to exercise, the FITT principles of exercise, normal and abnormal responses to exercise, and how to exercise safely.   Angina -Discuss definition of angina, causes of angina, treatment of angina, and how to decrease risk of having angina.   Cardiac Medications -Review what the following cardiac medications are used for, how they affect the body, and side effects that may occur when taking the medications.  Medications include Aspirin, Beta blockers, calcium channel blockers, ACE Inhibitors, angiotensin receptor blockers, diuretics, digoxin, and antihyperlipidemics.   Congestive Heart Failure -Discuss the definition of CHF, how to live with CHF, the  signs and symptoms of CHF, and how keep track of weight and sodium intake.   Heart Disease and Intimacy -Discus the effect sexual activity has on the heart, how changes occur during intimacy as we age, and safety during sexual activity.   Smoking Cessation / COPD -Discuss different methods to quit smoking, the health benefits of quitting smoking, and the definition of COPD.   Nutrition I: Fats -Discuss the types of cholesterol, what cholesterol does to the heart, and how cholesterol levels can be controlled.   Nutrition II: Labels -Discuss the different components of food labels and how to read food label   Heart Parts and Heart Disease -Discuss the anatomy of the heart, the pathway of blood circulation through the heart, and these are affected by heart disease.   Stress I: Signs and Symptoms -Discuss the causes of stress, how stress may lead to anxiety and depression, and ways to limit stress.   Stress II: Relaxation -Discuss different types of relaxation techniques to limit stress.   Warning Signs of Stroke / TIA -Discuss definition of a stroke, what the signs and symptoms are of  a stroke, and how to identify when someone is having stroke.   Knowledge Questionnaire Score:     Knowledge Questionnaire Score - 07/12/16 1520      Knowledge Questionnaire Score   Pre Score 22/24      Core Components/Risk Factors/Patient Goals at Admission:     Personal Goals and Risk Factors at Admission - 07/12/16 1523      Core Components/Risk Factors/Patient Goals on Admission    Weight Management Weight Maintenance   Personal Goal Other Yes   Personal Goal Get stronger and to learn what his exercise perameters are to maintain a healthly lifestyle.    Intervention Attend CR 3 x week and supplement exercise at home 2 x week.    Expected Outcomes Achieve personal goals.       Core Components/Risk Factors/Patient Goals Review:    Core Components/Risk Factors/Patient Goals at  Discharge (Final Review):    ITP Comments:     ITP Comments    Row Name 07/12/16 1521 07/13/16 1234         ITP Comments Patrick Humphrey is a 51 year old male that recently had a Heart Transplant. he is doing very well.  Patient new to program. Plans to start Monday 07/16/16.         Comments: ITP 30 Day REVIEW Patient new to program. Plans to start Monday 07/16/16

## 2016-07-16 ENCOUNTER — Encounter (HOSPITAL_COMMUNITY): Payer: Self-pay

## 2016-07-16 ENCOUNTER — Encounter (HOSPITAL_COMMUNITY): Payer: Medicare Other

## 2016-07-18 ENCOUNTER — Encounter (HOSPITAL_COMMUNITY)
Admission: RE | Admit: 2016-07-18 | Discharge: 2016-07-18 | Disposition: A | Discharge status: Home / Self Care | Payer: Medicare Other | Source: Ambulatory Visit | Attending: Cardiology | Admitting: Cardiology

## 2016-07-18 DIAGNOSIS — Z941 Heart transplant status: Secondary | ICD-10-CM | POA: Diagnosis not present

## 2016-07-18 NOTE — Progress Notes (Signed)
Daily Session Note  Patient Details  Name: Patrick Humphrey MRN: [id number] Date of Birth: May 03, 1965 Referring Provider:     CARDIAC REHAB PHASE II ORIENTATION from 07/12/2016 in [place]  Referring Provider  Dr. Mosetta Pigeon      Encounter Date: 07/18/2016  Check In:     Session Check In - 07/18/16 1100      Check-In   Location [facility]   Staff Present [name] [name], MS, EP, CHC, Exercise Physiologist;[name], BS, EP, Exercise Physiologist;[name], RN, BSN   Supervising physician immediately available to respond to emergencies See telemetry face sheet for immediately available MD   Medication changes reported     No   Fall or balance concerns reported    No   Warm-up and Cool-down Performed as group-led instruction   Resistance Training Performed Yes   VAD Patient? No     Pain Assessment   Currently in Pain? No/denies   Pain Score 0-No pain   Multiple Pain Sites No      Capillary Blood Glucose: No results found for this or any previous visit (from the past 24 hour(s)).    History  Smoking Status  . Former Smoker  . Packs/day: 1.00  . Quit date: 02/20/1998  Smokeless Tobacco  . Never Used    Goals Met:  Independence with exercise equipment Exercise tolerated well No report of cardiac concerns or symptoms Strength training completed today  Goals Unmet:  Not Applicable  Comments: Check out: 12 noon   Dr. Kate Sable is Medical Director for [place] and Pulmonary Rehab.

## 2016-07-20 ENCOUNTER — Encounter (HOSPITAL_COMMUNITY)
Admission: RE | Admit: 2016-07-20 | Discharge: 2016-07-20 | Disposition: A | Discharge status: Home / Self Care | Payer: Medicare Other | Source: Ambulatory Visit | Attending: Cardiology | Admitting: Cardiology

## 2016-07-20 ENCOUNTER — Encounter: Payer: Self-pay | Admitting: Cardiology

## 2016-07-20 DIAGNOSIS — Z941 Heart transplant status: Secondary | ICD-10-CM | POA: Diagnosis not present

## 2016-07-20 NOTE — Progress Notes (Signed)
Daily Session Note  Patient Details  Name: Patrick Humphrey MRN: [id number] Date of Birth: 08/15/1965 Referring Provider:     CARDIAC REHAB PHASE II ORIENTATION from 07/12/2016 in [place]  Referring Provider  Dr. Mosetta Pigeon      Encounter Date: 07/20/2016  Check In:     Session Check In - 07/20/16 1100      Check-In   Location [facility]   Staff Present Aundra Dubin, RN, BSN;Selicia Windom Luther Parody, BS, EP, Exercise Physiologist   Supervising physician immediately available to respond to emergencies See telemetry face sheet for immediately available MD   Medication changes reported     No   Fall or balance concerns reported    No   Warm-up and Cool-down Performed as group-led instruction   Resistance Training Performed Yes   VAD Patient? No     Pain Assessment   Currently in Pain? No/denies   Pain Score 0-No pain   Multiple Pain Sites No      Capillary Blood Glucose: No results found for this or any previous visit (from the past 24 hour(s)).    History  Smoking Status  . Former Smoker  . Packs/day: 1.00  . Quit date: 02/20/1998  Smokeless Tobacco  . Never Used    Goals Met:  Independence with exercise equipment Exercise tolerated well No report of cardiac concerns or symptoms Strength training completed today  Goals Unmet:  Not Applicable  Comments: Check out 1200   Dr. Kate Sable is Medical Director for [place] and Pulmonary Rehab.

## 2016-07-23 ENCOUNTER — Encounter (HOSPITAL_COMMUNITY)
Admission: RE | Admit: 2016-07-23 | Discharge: 2016-07-23 | Disposition: A | Discharge status: Home / Self Care | Payer: Medicare Other | Source: Ambulatory Visit | Attending: Cardiology | Admitting: Cardiology

## 2016-07-23 DIAGNOSIS — Z941 Heart transplant status: Secondary | ICD-10-CM | POA: Diagnosis not present

## 2016-07-23 NOTE — Progress Notes (Signed)
Daily Session Note  Patient Details  Name: GAGE WEANT MRN: [id number] Date of Birth: October 08, 1965 Referring Provider:     CARDIAC REHAB PHASE II ORIENTATION from 07/12/2016 in [place]  Referring Provider  Dr. Mosetta Pigeon      Encounter Date: 07/23/2016  Check In:     Session Check In - 07/23/16 1114      Check-In   Location [facility]   Staff Present Aundra Dubin, RN, BSN;Ranon Coven Luther Parody, BS, EP, Exercise Physiologist   Supervising physician immediately available to respond to emergencies See telemetry face sheet for immediately available MD   Medication changes reported     No   Fall or balance concerns reported    No   Warm-up and Cool-down Performed as group-led instruction   Resistance Training Performed Yes   VAD Patient? No     Pain Assessment   Currently in Pain? No/denies   Pain Score 0-No pain   Multiple Pain Sites No      Capillary Blood Glucose: No results found for this or any previous visit (from the past 24 hour(s)).    History  Smoking Status  . Former Smoker  . Packs/day: 1.00  . Quit date: 02/20/1998  Smokeless Tobacco  . Never Used    Goals Met:  Independence with exercise equipment Exercise tolerated well No report of cardiac concerns or symptoms Strength training completed today  Goals Unmet:  Not Applicable  Comments: Check out 1200   Dr. Kate Sable is Medical Director for [place] and Pulmonary Rehab.

## 2016-07-25 ENCOUNTER — Encounter (HOSPITAL_COMMUNITY)
Admission: RE | Admit: 2016-07-25 | Discharge: 2016-07-25 | Disposition: A | Discharge status: Home / Self Care | Payer: Medicare Other | Source: Ambulatory Visit | Attending: Cardiology | Admitting: Cardiology

## 2016-07-25 DIAGNOSIS — Z941 Heart transplant status: Secondary | ICD-10-CM | POA: Diagnosis not present

## 2016-07-25 NOTE — Progress Notes (Signed)
Daily Session Note  Patient Details  Name: Patrick Humphrey MRN: [id number] Date of Birth: 05-02-1965 Referring Provider:     CARDIAC REHAB PHASE II ORIENTATION from 07/12/2016 in [place]  Referring Provider  Dr. Mosetta Pigeon      Encounter Date: 07/25/2016  Check In:     Session Check In - 07/25/16 1054      Check-In   Location [facility]   Staff Present Aundra Dubin, RN, BSN;Undrea Shipes Luther Parody, BS, EP, Exercise Physiologist   Supervising physician immediately available to respond to emergencies See telemetry face sheet for immediately available MD   Medication changes reported     No   Fall or balance concerns reported    No   Warm-up and Cool-down Performed as group-led instruction   Resistance Training Performed Yes   VAD Patient? No     Pain Assessment   Currently in Pain? No/denies   Pain Score 0-No pain   Multiple Pain Sites No      Capillary Blood Glucose: No results found for this or any previous visit (from the past 24 hour(s)).    History  Smoking Status  . Former Smoker  . Packs/day: 1.00  . Quit date: 02/20/1998  Smokeless Tobacco  . Never Used    Goals Met:  Independence with exercise equipment Exercise tolerated well No report of cardiac concerns or symptoms Great job   Goals Unmet:  Not Applicable  Comments: Check out 1200   Dr. Kate Sable is Medical Director for [place] and Pulmonary Rehab.

## 2016-07-27 ENCOUNTER — Encounter (HOSPITAL_COMMUNITY): Payer: Medicare Other

## 2016-07-30 ENCOUNTER — Encounter (HOSPITAL_COMMUNITY): Payer: Medicare Other

## 2016-08-01 ENCOUNTER — Encounter (HOSPITAL_COMMUNITY)
Admission: RE | Admit: 2016-08-01 | Discharge: 2016-08-01 | Disposition: A | Discharge status: Home / Self Care | Payer: Medicare Other | Source: Ambulatory Visit | Attending: Cardiology | Admitting: Cardiology

## 2016-08-01 DIAGNOSIS — Z941 Heart transplant status: Secondary | ICD-10-CM

## 2016-08-01 NOTE — Progress Notes (Signed)
Daily Session Note  Patient Details  Name: ERICO STAN MRN: [id number] Date of Birth: 02-Jan-1966 Referring Provider:     CARDIAC REHAB PHASE II ORIENTATION from 07/12/2016 in [place]  Referring Provider  Dr. Mosetta Pigeon      Encounter Date: 08/01/2016  Check In:     Session Check In - 08/01/16 1109      Check-In   Location [facility]   Staff Present Aundra Dubin, RN, BSN;Tocara Mennen Luther Parody, BS, EP, Exercise Physiologist   Supervising physician immediately available to respond to emergencies See telemetry face sheet for immediately available MD   Medication changes reported     No   Fall or balance concerns reported    No   Warm-up and Cool-down Performed as group-led instruction   Resistance Training Performed Yes   VAD Patient? No     Pain Assessment   Currently in Pain? No/denies   Pain Score 0-No pain   Multiple Pain Sites No      Capillary Blood Glucose: No results found for this or any previous visit (from the past 24 hour(s)).    History  Smoking Status  . Former Smoker  . Packs/day: 1.00  . Quit date: 02/20/1998  Smokeless Tobacco  . Never Used    Goals Met:  Independence with exercise equipment Exercise tolerated well No report of cardiac concerns or symptoms Strength training completed today  Goals Unmet:  Not Applicable  Comments: Check out 1200   Dr. Kate Sable is Medical Director for [place] and Pulmonary Rehab.

## 2016-08-01 NOTE — Progress Notes (Signed)
Cardiac Individual Treatment Plan  Patient Details  Name: Patrick Humphrey MRN: [id number] Date of Birth: September 08, 1965 Referring Provider:     [place] from 07/12/2016 in [place]  Referring Provider  Dr. Mosetta Pigeon      Initial Encounter Date:    CARDIAC REHAB PHASE II ORIENTATION from 07/12/2016 in [place]  Date  07/12/16  Referring Provider  Dr. Mosetta Pigeon      Visit Diagnosis: H/O heart transplant [facility])  Patient's Home Medications on Admission:  Current Outpatient Prescriptions:  .  acetaminophen (TYLENOL) 325 MG tablet, Take 650 mg by mouth every 6 (six) hours as needed for mild pain., Disp: , Rfl:  .  amLODipine (NORVASC) 10 MG tablet, Take 10 mg by mouth daily., Disp: , Rfl:  .  aspirin EC 81 MG tablet, Take 81 mg by mouth daily., Disp: , Rfl:  .  Calcium Carbonate-Vitamin D (CALTRATE 600+D) 600-400 MG-UNIT tablet, Take 1 tablet by mouth 2 (two) times daily., Disp: , Rfl:  .  cetirizine (ZYRTEC) 10 MG tablet, Take 10 mg by mouth daily., Disp: , Rfl:  .  docusate sodium (COLACE) 100 MG capsule, Take 2 capsules (200 mg total) by mouth daily as needed for mild constipation. (Patient taking differently: Take 200 mg by mouth daily. ), Disp: 10 capsule, Rfl: 6 .  ferrous sulfate 325 (65 FE) MG tablet, Take 325 mg by mouth daily with breakfast., Disp: , Rfl:  .  fluticasone (FLONASE) 50 MCG/ACT nasal spray, Place 1 spray into both nostrils daily., Disp: , Rfl:  .  folic acid (FOLVITE) 1 MG tablet, Take 1 mg by mouth daily., Disp: , Rfl:  .  magnesium oxide (MAG-OX) 400 MG tablet, Take 400 mg by mouth 2 (two) times daily., Disp: , Rfl:  .  mycophenolate (CELLCEPT) 500 MG tablet, Take 1,000 mg by mouth., Disp: , Rfl:  .  oxyCODONE (OXY IR/ROXICODONE) 5 MG immediate release tablet, Take 5 mg by mouth every 4 (four) hours as needed for severe pain., Disp: , Rfl:  .  pantoprazole (PROTONIX) 40 MG tablet, Take 40 mg by mouth  daily., Disp: , Rfl:  .  pravastatin (PRAVACHOL) 20 MG tablet, Take 20 mg by mouth daily at 6 PM., Disp: , Rfl:  .  predniSONE (DELTASONE) 5 MG tablet, Take 5 mg by mouth daily with breakfast., Disp: , Rfl:  .  tacrolimus (PROGRAF) 5 MG capsule, Take 7 mg by mouth 2 (two) times daily. Take with 1mg  doses to make 7mg  BID total, Disp: , Rfl:  .  valACYclovir (VALTREX) 500 MG tablet, Take 500 mg by mouth 2 (two) times daily., Disp: , Rfl:   Past Medical History: Past Medical History:  Diagnosis Date  . AICD (automatic cardioverter/defibrillator) present   . CHF (congestive heart failure) ([place])   . Hx of echocardiogram 11/09/1910   Showed an EF of 25% with no significant valve disease.  . ICD (implantable cardioverter-defibrillator), single, in situ 08/26/2007  . Nonischemic cardiomyopathy ([place])   . Presence of permanent cardiac pacemaker     Tobacco Use: History  Smoking Status  . Former Smoker  . Packs/day: 1.00  . Quit date: 02/20/1998  Smokeless Tobacco  . Never Used    Labs: Recent Review Flowsheet Data    Labs for ITP Cardiac and Pulmonary Rehab Latest Ref Rng & Units 05/26/2015 05/27/2015 05/28/2015 05/29/2015 05/30/2015   Cholestrol 0 - 200 mg/dL - - - - -   LDLCALC  0 - 99 mg/dL - - - - -   HDL >40 mg/dL - - - - -   Trlycerides <150 mg/dL - - - - -   Hemoglobin A1c 4.8 - 5.6 % - - - - -   PHART 7.350 - 7.450 - - - - -   PCO2ART 35.0 - 45.0 mmHg - - - - -   HCO3 20.0 - 24.0 mEq/L - - - - -   TCO2 0 - 100 mmol/L - - - - -   ACIDBASEDEF 0.0 - 2.0 mmol/L - - - - -   O2SAT % 57.2 60.6 67.4 64.5 73.0      Capillary Blood Glucose: Lab Results  Component Value Date   GLUCAP 94 05/28/2015   GLUCAP 107 (H) 05/18/2015   GLUCAP 102 (H) 05/14/2015   GLUCAP 77 05/14/2015   GLUCAP 103 (H) 05/14/2015     Exercise Target Goals:    Exercise Program Goal: Individual exercise prescription set with THRR, safety & activity barriers. Participant demonstrates ability to understand  and report RPE using BORG scale, to self-measure pulse accurately, and to acknowledge the importance of the exercise prescription.  Exercise Prescription Goal: Starting with aerobic activity 30 plus minutes a day, 3 days per week for initial exercise prescription. Provide home exercise prescription and guidelines that participant acknowledges understanding prior to discharge.  Activity Barriers & Risk Stratification:   6 Minute Walk:     6 Minute Walk    Row Name 07/12/16 1506         6 Minute Walk   Phase Initial     Distance 1600 feet     Distance % Change 0 %     Walk Time 6 minutes     # of Rest Breaks 0     MPH 3.03     METS 3.32     RPE 7     Perceived Dyspnea  7     VO2 Peak 17.09     Symptoms No     Resting HR 92 bpm     Resting BP 126/64     Max Ex. HR 100 bpm     Max Ex. BP 152/82     2 Minute Post BP 140/76        Oxygen Initial Assessment:   Oxygen Re-Evaluation:   Oxygen Discharge (Final Oxygen Re-Evaluation):   Initial Exercise Prescription:     Initial Exercise Prescription - 07/12/16 1500      Date of Initial Exercise RX and Referring Provider   Date 07/12/16   Referring Provider Dr. Mosetta Pigeon     Treadmill   MPH 2.3   Grade 0   Minutes 15   METs 2.7     Recumbant Elliptical   Level 1   RPM 38   Watts 44   Minutes 20   METs 2.5     Prescription Details   Frequency (times per week) 3   Duration Progress to 30 minutes of continuous aerobic without signs/symptoms of physical distress     Intensity   THRR 40-80% of Max Heartrate [phone]   Ratings of Perceived Exertion 11-13   Perceived Dyspnea 0-4     Progression   Progression Continue progressive overload as per policy without signs/symptoms or physical distress.     Resistance Training   Training Prescription Yes   Weight 1   Reps 10-15      Perform Capillary Blood Glucose checks as needed.  Exercise Prescription Changes:  Exercise Prescription Changes     Row Name 07/30/16 1400             Response to Exercise   Blood Pressure (Admit) 136/76       Blood Pressure (Exercise) 140/78       Blood Pressure (Exit) 130/82       Heart Rate (Admit) 81 bpm       Heart Rate (Exercise) 96 bpm       Heart Rate (Exit) 89 bpm       Rating of Perceived Exertion (Exercise) 7       Duration Progress to 30 minutes of  aerobic without signs/symptoms of physical distress       Intensity THRR unchanged         Progression   Progression Continue to progress workloads to maintain intensity without signs/symptoms of physical distress.         Resistance Training   Training Prescription Yes       Weight 2       Reps 10-15         Treadmill   MPH 2.6       Grade 0       Minutes 15       METs 2.9         Recumbant Elliptical   Level 2       RPM 69       Watts 109       Minutes 20       METs 5.8         Home Exercise Plan   Plans to continue exercise at Home (comment)       Frequency Add 2 additional days to program exercise sessions.          Exercise Comments:     Exercise Comments    Row Name 07/30/16 1424           Exercise Comments Patient is progressing well in CR          Exercise Goals and Review:     Exercise Goals    Row Name 07/12/16 1523             Exercise Goals   Increase Physical Activity Yes       Intervention Provide advice, education, support and counseling about physical activity/exercise needs.;Develop an individualized exercise prescription for aerobic and resistive training based on initial evaluation findings, risk stratification, comorbidities and participant's personal goals.       Expected Outcomes Achievement of increased cardiorespiratory fitness and enhanced flexibility, muscular endurance and strength shown through measurements of functional capacity and personal statement of participant.       Increase Strength and Stamina Yes       Intervention Develop an individualized exercise prescription  for aerobic and resistive training based on initial evaluation findings, risk stratification, comorbidities and participant's personal goals.;Provide advice, education, support and counseling about physical activity/exercise needs.       Expected Outcomes Achievement of increased cardiorespiratory fitness and enhanced flexibility, muscular endurance and strength shown through measurements of functional capacity and personal statement of participant.          Exercise Goals Re-Evaluation :     Exercise Goals Re-Evaluation    [place] Name 08/01/16 1454             Exercise Goal Re-Evaluation   Exercise Goals Review Increase Physical Activity;Increase Strenth and Stamina  Know his exercise perameters; get stronger.  Comments After completing 6 sessions, patient says he feels stronger and is learning how hard to push himself. He feels the program is benefiting him.        Expected Outcomes Patient will complete the program with continued increased strength, stamina, and activity.           Discharge Exercise Prescription (Final Exercise Prescription Changes):     Exercise Prescription Changes - 07/30/16 1400      Response to Exercise   Blood Pressure (Admit) 136/76   Blood Pressure (Exercise) 140/78   Blood Pressure (Exit) 130/82   Heart Rate (Admit) 81 bpm   Heart Rate (Exercise) 96 bpm   Heart Rate (Exit) 89 bpm   Rating of Perceived Exertion (Exercise) 7   Duration Progress to 30 minutes of  aerobic without signs/symptoms of physical distress   Intensity THRR unchanged     Progression   Progression Continue to progress workloads to maintain intensity without signs/symptoms of physical distress.     Resistance Training   Training Prescription Yes   Weight 2   Reps 10-15     Treadmill   MPH 2.6   Grade 0   Minutes 15   METs 2.9     Recumbant Elliptical   Level 2   RPM 69   Watts 109   Minutes 20   METs 5.8     Home Exercise Plan   Plans to continue  exercise at Home (comment)   Frequency Add 2 additional days to program exercise sessions.      Nutrition:  Target Goals: Understanding of nutrition guidelines, daily intake of sodium 1500mg , cholesterol 200mg , calories 30% from fat and 7% or less from saturated fats, daily to have 5 or more servings of fruits and vegetables.  Biometrics:     Pre Biometrics - 07/12/16 1507      Pre Biometrics   Height 5\' 11"  (1.803 m)   Weight 174 lb 13.2 oz (79.3 kg)   Waist Circumference 33 inches   Hip Circumference 38 inches   Waist to Hip Ratio 0.87 %   BMI (Calculated) 24.4   Triceps Skinfold 10 mm   % Body Fat 20.6 %   Grip Strength 92.33 kg   Flexibility 20.17 in   Single Leg Stand 60 seconds       Nutrition Therapy Plan and Nutrition Goals:   Nutrition Discharge: Rate Your Plate Scores:     Nutrition Assessments - 07/12/16 1523      MEDFICTS Scores   Pre Score 45      Nutrition Goals Re-Evaluation:   Nutrition Goals Discharge (Final Nutrition Goals Re-Evaluation):   Psychosocial: Target Goals: Acknowledge presence or absence of significant depression and/or stress, maximize coping skills, provide positive support system. Participant is able to verbalize types and ability to use techniques and skills needed for reducing stress and depression.  Initial Review & Psychosocial Screening:     Initial Psych Review & Screening - 07/12/16 1542      Initial Review   Current issues with None Identified     Family Dynamics   Good Support System? Yes     Barriers   Psychosocial barriers to participate in program Psychosocial barriers identified (see note)     Screening Interventions   Interventions Encouraged to exercise      Quality of Life Scores:     Quality of Life - 07/12/16 1508      Quality of Life Scores   Health/Function Pre 26.57 %  Socioeconomic Pre 25.2 %   Psych/Spiritual Pre 28.29 %   Family Pre 22.8 %   GLOBAL Pre 26.13 %       PHQ-9: Recent Review Flowsheet Data    Depression screen [facility] 2/9 07/12/2016   Decreased Interest 0   Down, Depressed, Hopeless 0   PHQ - 2 Score 0   Altered sleeping 0   Tired, decreased energy 0   Change in appetite 0   Feeling bad or failure about yourself  0   Trouble concentrating 0   Moving slowly or fidgety/restless 0   Suicidal thoughts 0   PHQ-9 Score 0     Interpretation of Total Score  Total Score Depression Severity:  1-4 = Minimal depression, 5-9 = Mild depression, 10-14 = Moderate depression, 15-19 = Moderately severe depression, 20-27 = Severe depression   Psychosocial Evaluation and Intervention:     Psychosocial Evaluation - 07/12/16 1544      Psychosocial Evaluation & Interventions   Interventions Encouraged to exercise with the program and follow exercise prescription   Continue Psychosocial Services  No Follow up required      Psychosocial Re-Evaluation:     Psychosocial Re-Evaluation    [place] Name 08/01/16 1455             Psychosocial Re-Evaluation   Current issues with None Identified       Expected Outcomes Patient will have no psychosocial issues identified at discharge.        Interventions Encouraged to attend Cardiac Rehabilitation for the exercise       Continue Psychosocial Services  No Follow up required          Psychosocial Discharge (Final Psychosocial Re-Evaluation):     Psychosocial Re-Evaluation - 08/01/16 1455      Psychosocial Re-Evaluation   Current issues with None Identified   Expected Outcomes Patient will have no psychosocial issues identified at discharge.    Interventions Encouraged to attend Cardiac Rehabilitation for the exercise   Continue Psychosocial Services  No Follow up required      Vocational Rehabilitation: Provide vocational rehab assistance to qualifying candidates.   Vocational Rehab Evaluation & Intervention:     Vocational Rehab - 07/12/16 1520      Initial Vocational Rehab Evaluation  & Intervention   Assessment shows need for Vocational Rehabilitation No      Education: Education Goals: Education classes will be provided on a weekly basis, covering required topics. Participant will state understanding/return demonstration of topics presented.  Learning Barriers/Preferences:     Learning Barriers/Preferences - 07/12/16 1515      Learning Barriers/Preferences   Learning Barriers None   Learning Preferences Video;Group Instruction;Skilled Demonstration;Pictoral      Education Topics: Hypertension, Hypertension Reduction -Define heart disease and high blood pressure. Discus how high blood pressure affects the body and ways to reduce high blood pressure.   Exercise and Your Heart -Discuss why it is important to exercise, the FITT principles of exercise, normal and abnormal responses to exercise, and how to exercise safely.   Angina -Discuss definition of angina, causes of angina, treatment of angina, and how to decrease risk of having angina.   Cardiac Medications -Review what the following cardiac medications are used for, how they affect the body, and side effects that may occur when taking the medications.  Medications include Aspirin, Beta blockers, calcium channel blockers, ACE Inhibitors, angiotensin receptor blockers, diuretics, digoxin, and antihyperlipidemics.   Congestive Heart Failure -Discuss the definition of CHF, how to  live with CHF, the signs and symptoms of CHF, and how keep track of weight and sodium intake.   Heart Disease and Intimacy -Discus the effect sexual activity has on the heart, how changes occur during intimacy as we age, and safety during sexual activity.   Smoking Cessation / COPD -Discuss different methods to quit smoking, the health benefits of quitting smoking, and the definition of COPD.   Nutrition I: Fats -Discuss the types of cholesterol, what cholesterol does to the heart, and how cholesterol levels can be  controlled.   Nutrition II: Labels -Discuss the different components of food labels and how to read food label   Heart Parts and Heart Disease -Discuss the anatomy of the heart, the pathway of blood circulation through the heart, and these are affected by heart disease.   Stress I: Signs and Symptoms -Discuss the causes of stress, how stress may lead to anxiety and depression, and ways to limit stress.   Stress II: Relaxation -Discuss different types of relaxation techniques to limit stress.   CARDIAC REHAB PHASE II EXERCISE from 08/01/2016 in [place]  Date  07/25/16  Educator  [place]  Instruction Review Code  2- meets goals/outcomes      Warning Signs of Stroke / TIA -Discuss definition of a stroke, what the signs and symptoms are of a stroke, and how to identify when someone is having stroke.   CARDIAC REHAB PHASE II EXERCISE from 08/01/2016 in [place]  Date  08/01/16  Educator  [place]  Instruction Review Code  2- meets goals/outcomes      Knowledge Questionnaire Score:     Knowledge Questionnaire Score - 07/12/16 1520      Knowledge Questionnaire Score   Pre Score 22/24      Core Components/Risk Factors/Patient Goals at Admission:     Personal Goals and Risk Factors at Admission - 07/12/16 1523      Core Components/Risk Factors/Patient Goals on Admission    Weight Management Weight Maintenance   Personal Goal Other Yes   Personal Goal Get stronger and to learn what his exercise perameters are to maintain a healthly lifestyle.    Intervention Attend CR 3 x week and supplement exercise at home 2 x week.    Expected Outcomes Achieve personal goals.       Core Components/Risk Factors/Patient Goals Review:      Goals and Risk Factor Review    Row Name 08/01/16 1453             Core Components/Risk Factors/Patient Goals Review   Personal Goals Review Weight Management/Obesity       Review Patient has completed  6 sessions gaining 1.5 lbs. He is doing well in the program.        Expected Outcomes Patient will complete the program meeting his personal goals.           Core Components/Risk Factors/Patient Goals at Discharge (Final Review):      Goals and Risk Factor Review - 08/01/16 1453      Core Components/Risk Factors/Patient Goals Review   Personal Goals Review Weight Management/Obesity   Review Patient has completed 6 sessions gaining 1.5 lbs. He is doing well in the program.    Expected Outcomes Patient will complete the program meeting his personal goals.       ITP Comments:     ITP Comments    Row Name 07/12/16 1521 07/13/16 1234  ITP Comments Mr. Rapozo is a 51 year old male that recently had a Heart Transplant. he is doing very well.  Patient new to program. Plans to start Monday 07/16/16.         Comments: ITP 30 Day REVIEW Patient progressing well in the program. Will continue to monitor for progress.

## 2016-08-03 ENCOUNTER — Encounter (HOSPITAL_COMMUNITY)
Admission: RE | Admit: 2016-08-03 | Discharge: 2016-08-03 | Disposition: A | Discharge status: Home / Self Care | Payer: Medicare Other | Source: Ambulatory Visit | Attending: Cardiology | Admitting: Cardiology

## 2016-08-03 DIAGNOSIS — Z941 Heart transplant status: Secondary | ICD-10-CM | POA: Diagnosis not present

## 2016-08-03 NOTE — Progress Notes (Signed)
Daily Session Note  Patient Details  Name: Patrick Humphrey MRN: [id number] Date of Birth: [birth year] Referring Provider:     CARDIAC REHAB PHASE II ORIENTATION from 07/12/2016 in [place]  Referring Provider  Dr. Mosetta Pigeon      Encounter Date: 08/03/2016  Check In:     Session Check In - 08/03/16 1115      Check-In   Location [facility]-Card[facility] & Pulmonary Rehab   Staff Present Russella Dar, MS, EP, [facility], Exercise Physiologist;Lenna Hagarty Luther Parody, BS, EP, Exercise Physiologist   Supervising physician immediately available to respond to emergencies See telemetry face sheet for immediately available MD   Medication changes reported     No   Fall or balance concerns reported    No   Warm-up and Cool-down Performed as group-led instruction   Resistance Training Performed Yes   VAD Patient? No     Pain Assessment   Currently in Pain? No/denies   Pain Score 0-No pain   Multiple Pain Sites No      Capillary Blood Glucose: No results found for this or any previous visit (from the past 24 hour(s)).    History  Smoking Status  . Former Smoker  . Packs/day: 1.00  . Quit date: 02/20/1998  Smokeless Tobacco  . Never Used    Goals Met:  Independence with exercise equipment Exercise tolerated well No report of cardiac concerns or symptoms Strength training completed today  Goals Unmet:  Not Applicable  Comments: Check out 1200   Dr. Kate Sable is Medical Director for [place] and Pulmonary Rehab.

## 2016-08-06 ENCOUNTER — Encounter (HOSPITAL_COMMUNITY)
Admission: RE | Admit: 2016-08-06 | Discharge: 2016-08-06 | Disposition: A | Discharge status: Home / Self Care | Payer: Medicare Other | Source: Ambulatory Visit | Attending: Cardiology | Admitting: Cardiology

## 2016-08-06 DIAGNOSIS — Z941 Heart transplant status: Secondary | ICD-10-CM

## 2016-08-06 NOTE — Progress Notes (Signed)
Daily Session Note  Patient Details  Name: Patrick Humphrey MRN: [id number] Date of Birth: Nov 25, 1965 Referring Provider:     CARDIAC REHAB PHASE II ORIENTATION from 07/12/2016 in [place]  Referring Provider  Dr. Mosetta Pigeon      Encounter Date: 08/06/2016  Check In:     Session Check In - 08/06/16 1134      Check-In   Location [facility]-Cardiac [facility] Pulmonary Rehab   Staff Present Diane [name], MS, EP, [facility], Exercise Physiologist;Awilda Covin Luther Parody, BS, EP, Exercise Physiologist   Supervising physician immediately available to respond to emergencies See telemetry face sheet for immediately available MD   Medication changes reported     No   Fall or balance concerns reported    No   Warm-up and Cool-down Performed as group-led instruction   Resistance Training Performed Yes   VAD Patient? No     Pain Assessment   Currently in Pain? No/denies   Pain Score 0-No pain   Multiple Pain Sites No      Capillary Blood Glucose: No results found for this or any previous visit (from the past 24 hour(s)).    History  Smoking Status  . Former Smoker  . Packs/day: 1.00  . Quit date: 02/20/1998  Smokeless Tobacco  . Never Used    Goals Met:  Independence with exercise equipment Exercise tolerated well No report of cardiac concerns or symptoms Strength training completed today  Goals Unmet:  Not Applicable  Comments: Check out 1200   Dr. Kate Sable is Medical Director for [place] and Pulmonary Rehab.

## 2016-08-08 ENCOUNTER — Encounter (HOSPITAL_COMMUNITY): Payer: Medicare Other

## 2016-08-10 ENCOUNTER — Encounter (HOSPITAL_COMMUNITY): Payer: Medicare Other

## 2016-08-13 ENCOUNTER — Encounter (HOSPITAL_COMMUNITY)
Admission: RE | Admit: 2016-08-13 | Discharge: 2016-08-13 | Disposition: A | Discharge status: Home / Self Care | Payer: Medicare Other | Source: Ambulatory Visit | Attending: Cardiology | Admitting: Cardiology

## 2016-08-13 DIAGNOSIS — Z79899 Other long term (current) drug therapy: Secondary | ICD-10-CM | POA: Diagnosis not present

## 2016-08-13 DIAGNOSIS — Z87891 Personal history of nicotine dependence: Secondary | ICD-10-CM | POA: Diagnosis not present

## 2016-08-13 DIAGNOSIS — Z7951 Long term (current) use of inhaled steroids: Secondary | ICD-10-CM | POA: Insufficient documentation

## 2016-08-13 DIAGNOSIS — Z941 Heart transplant status: Secondary | ICD-10-CM | POA: Diagnosis not present

## 2016-08-13 NOTE — Progress Notes (Signed)
Daily Session Note  Patient Details  Name: Patrick Humphrey MRN: [id number] Date of Birth: December 12, 1965 Referring Provider:     CARDIAC REHAB PHASE II ORIENTATION from 07/12/2016 in [place]  Referring Provider  Dr. Mosetta Pigeon      Encounter Date: 08/13/2016  Check In:     Session Check In - 08/13/16 1100      Check-In   Location [facility]-Card[facility] & Pulmonary Rehab   Staff Present Diane [name], MS, EP, [facility], Exercise Physiologist;Louvina Cleary Wynetta Emery, RN, BSN   Supervising physician immediately available to respond to emergencies See telemetry face sheet for immediately available MD   Medication changes reported     No   Fall or balance concerns reported    No   Warm-up and Cool-down Performed as group-led instruction   Resistance Training Performed Yes   VAD Patient? No     Pain Assessment   Currently in Pain? No/denies   Pain Score 0-No pain   Multiple Pain Sites No      Capillary Blood Glucose: No results found for this or any previous visit (from the past 24 hour(s)).    History  Smoking Status  . Former Smoker  . Packs/day: 1.00  . Quit date: 02/20/1998  Smokeless Tobacco  . Never Used    Goals Met:  Independence with exercise equipment Exercise tolerated well No report of cardiac concerns or symptoms Strength training completed today  Goals Unmet:  Not Applicable  Comments: Check out 1200.   Dr. Kate Sable is Medical Director for [facility] Cardiac and Pulmonary Rehab.

## 2016-08-15 ENCOUNTER — Encounter (HOSPITAL_COMMUNITY): Payer: Medicare Other

## 2016-08-17 ENCOUNTER — Encounter (HOSPITAL_COMMUNITY): Payer: Medicare Other

## 2016-08-20 ENCOUNTER — Encounter (HOSPITAL_COMMUNITY): Payer: Medicare Other

## 2016-08-22 ENCOUNTER — Encounter (HOSPITAL_COMMUNITY): Payer: Medicare Other

## 2016-08-22 NOTE — Progress Notes (Signed)
Cardiac Individual Treatment Plan  Patient Details  Name: Patrick Humphrey MRN: [id number] Date of Birth: 1965/08/16 Referring Provider:     [place] from 07/12/2016 in [place]  Referring Provider  Dr. Mosetta Pigeon      Initial Encounter Date:    CARDIAC REHAB PHASE II ORIENTATION from 07/12/2016 in [place]  Date  07/12/16  Referring Provider  Dr. Mosetta Pigeon      Visit Diagnosis: H/O heart transplant [facility])  Patient's Home Medications on Admission:  Current Outpatient Prescriptions:  .  acetaminophen (TYLENOL) 325 MG tablet, Take 650 mg by mouth every 6 (six) hours as needed for mild pain., Disp: , Rfl:  .  amLODipine (NORVASC) 10 MG tablet, Take 10 mg by mouth daily., Disp: , Rfl:  .  aspirin EC 81 MG tablet, Take 81 mg by mouth daily., Disp: , Rfl:  .  Calcium Carbonate-Vitamin D (CALTRATE 600+D) 600-400 MG-UNIT tablet, Take 1 tablet by mouth 2 (two) times daily., Disp: , Rfl:  .  cetirizine (ZYRTEC) 10 MG tablet, Take 10 mg by mouth daily., Disp: , Rfl:  .  docusate sodium (COLACE) 100 MG capsule, Take 2 capsules (200 mg total) by mouth daily as needed for mild constipation. (Patient taking differently: Take 200 mg by mouth daily. ), Disp: 10 capsule, Rfl: 6 .  ferrous sulfate 325 (65 FE) MG tablet, Take 325 mg by mouth daily with breakfast., Disp: , Rfl:  .  fluticasone (FLONASE) 50 MCG/ACT nasal spray, Place 1 spray into both nostrils daily., Disp: , Rfl:  .  folic acid (FOLVITE) 1 MG tablet, Take 1 mg by mouth daily., Disp: , Rfl:  .  magnesium oxide (MAG-OX) 400 MG tablet, Take 400 mg by mouth 2 (two) times daily., Disp: , Rfl:  .  mycophenolate (CELLCEPT) 500 MG tablet, Take 1,000 mg by mouth., Disp: , Rfl:  .  oxyCODONE (OXY IR/ROXICODONE) 5 MG immediate release tablet, Take 5 mg by mouth every 4 (four) hours as needed for severe pain., Disp: , Rfl:  .  pantoprazole (PROTONIX) 40 MG tablet, Take 40 mg by mouth  daily., Disp: , Rfl:  .  pravastatin (PRAVACHOL) 20 MG tablet, Take 20 mg by mouth daily at 6 PM., Disp: , Rfl:  .  predniSONE (DELTASONE) 5 MG tablet, Take 5 mg by mouth daily with breakfast., Disp: , Rfl:  .  tacrolimus (PROGRAF) 5 MG capsule, Take 7 mg by mouth 2 (two) times daily. Take with 1mg  doses to make 7mg  BID total, Disp: , Rfl:  .  valACYclovir (VALTREX) 500 MG tablet, Take 500 mg by mouth 2 (two) times daily., Disp: , Rfl:   Past Medical History: Past Medical History:  Diagnosis Date  . AICD (automatic cardioverter/defibrillator) present   . CHF (congestive heart failure) ([place])   . Hx of echocardiogram 11/09/1910   Showed an EF of 25% with no significant valve disease.  . ICD (implantable cardioverter-defibrillator), single, in situ 08/26/2007  . Nonischemic cardiomyopathy ([place])   . Presence of permanent cardiac pacemaker     Tobacco Use: History  Smoking Status  . Former Smoker  . Packs/day: 1.00  . Quit date: 02/20/1998  Smokeless Tobacco  . Never Used    Labs: Recent Review Flowsheet Data    Labs for ITP Cardiac and Pulmonary Rehab Latest Ref Rng & Units 05/26/2015 05/27/2015 05/28/2015 05/29/2015 05/30/2015   Cholestrol 0 - 200 mg/dL - - - - -   LDLCALC  0 - 99 mg/dL - - - - -   HDL >40 mg/dL - - - - -   Trlycerides <150 mg/dL - - - - -   Hemoglobin A1c 4.8 - 5.6 % - - - - -   PHART 7.350 - 7.450 - - - - -   PCO2ART 35.0 - 45.0 mmHg - - - - -   HCO3 20.0 - 24.0 mEq/L - - - - -   TCO2 0 - 100 mmol/L - - - - -   ACIDBASEDEF 0.0 - 2.0 mmol/L - - - - -   O2SAT % 57.2 60.6 67.4 64.5 73.0      Capillary Blood Glucose: Lab Results  Component Value Date   GLUCAP 94 05/28/2015   GLUCAP 107 (H) 05/18/2015   GLUCAP 102 (H) 05/14/2015   GLUCAP 77 05/14/2015   GLUCAP 103 (H) 05/14/2015     Exercise Target Goals:    Exercise Program Goal: Individual exercise prescription set with THRR, safety & activity barriers. Participant demonstrates ability to understand  and report RPE using BORG scale, to self-measure pulse accurately, and to acknowledge the importance of the exercise prescription.  Exercise Prescription Goal: Starting with aerobic activity 30 plus minutes a day, 3 days per week for initial exercise prescription. Provide home exercise prescription and guidelines that participant acknowledges understanding prior to discharge.  Activity Barriers & Risk Stratification:   6 Minute Walk:     6 Minute Walk    Row Name 07/12/16 1506         6 Minute Walk   Phase Initial     Distance 1600 feet     Distance % Change 0 %     Walk Time 6 minutes     # of Rest Breaks 0     MPH 3.03     METS 3.32     RPE 7     Perceived Dyspnea  7     VO2 Peak 17.09     Symptoms No     Resting HR 92 bpm     Resting BP 126/64     Max Ex. HR 100 bpm     Max Ex. BP 152/82     2 Minute Post BP 140/76        Oxygen Initial Assessment:   Oxygen Re-Evaluation:   Oxygen Discharge (Final Oxygen Re-Evaluation):   Initial Exercise Prescription:     Initial Exercise Prescription - 07/12/16 1500      Date of Initial Exercise RX and Referring Provider   Date 07/12/16   Referring Provider Dr. Mosetta Pigeon     Treadmill   MPH 2.3   Grade 0   Minutes 15   METs 2.7     Recumbant Elliptical   Level 1   RPM 38   Watts 44   Minutes 20   METs 2.5     Prescription Details   Frequency (times per week) 3   Duration Progress to 30 minutes of continuous aerobic without signs/symptoms of physical distress     Intensity   THRR 40-80% of Max Heartrate [phone]   Ratings of Perceived Exertion 11-13   Perceived Dyspnea 0-4     Progression   Progression Continue progressive overload as per policy without signs/symptoms or physical distress.     Resistance Training   Training Prescription Yes   Weight 1   Reps 10-15      Perform Capillary Blood Glucose checks as needed.  Exercise Prescription Changes:  Exercise Prescription Changes     Row Name 07/30/16 1400             Response to Exercise   Blood Pressure (Admit) 136/76       Blood Pressure (Exercise) 140/78       Blood Pressure (Exit) 130/82       Heart Rate (Admit) 81 bpm       Heart Rate (Exercise) 96 bpm       Heart Rate (Exit) 89 bpm       Rating of Perceived Exertion (Exercise) 7       Duration Progress to 30 minutes of  aerobic without signs/symptoms of physical distress       Intensity THRR unchanged         Progression   Progression Continue to progress workloads to maintain intensity without signs/symptoms of physical distress.         Resistance Training   Training Prescription Yes       Weight 2       Reps 10-15         Treadmill   MPH 2.6       Grade 0       Minutes 15       METs 2.9         Recumbant Elliptical   Level 2       RPM 69       Watts 109       Minutes 20       METs 5.8         Home Exercise Plan   Plans to continue exercise at Home (comment)       Frequency Add 2 additional days to program exercise sessions.          Exercise Comments:      Exercise Comments    Row Name 07/30/16 1424           Exercise Comments Patient is progressing well in CR          Exercise Goals and Review:      Exercise Goals    Row Name 07/12/16 1523             Exercise Goals   Increase Physical Activity Yes       Intervention Provide advice, education, support and counseling about physical activity/exercise needs.;Develop an individualized exercise prescription for aerobic and resistive training based on initial evaluation findings, risk stratification, comorbidities and participant's personal goals.       Expected Outcomes Achievement of increased cardiorespiratory fitness and enhanced flexibility, muscular endurance and strength shown through measurements of functional capacity and personal statement of participant.       Increase Strength and Stamina Yes       Intervention Develop an individualized exercise  prescription for aerobic and resistive training based on initial evaluation findings, risk stratification, comorbidities and participant's personal goals.;Provide advice, education, support and counseling about physical activity/exercise needs.       Expected Outcomes Achievement of increased cardiorespiratory fitness and enhanced flexibility, muscular endurance and strength shown through measurements of functional capacity and personal statement of participant.          Exercise Goals Re-Evaluation :     Exercise Goals Re-Evaluation    [place] Name 08/01/16 1454             Exercise Goal Re-Evaluation   Exercise Goals Review Increase Physical Activity;Increase Strenth and Stamina  Know his exercise perameters; get stronger.  Comments After completing 6 sessions, patient says he feels stronger and is learning how hard to push himself. He feels the program is benefiting him.        Expected Outcomes Patient will complete the program with continued increased strength, stamina, and activity.           Discharge Exercise Prescription (Final Exercise Prescription Changes):     Exercise Prescription Changes - 07/30/16 1400      Response to Exercise   Blood Pressure (Admit) 136/76   Blood Pressure (Exercise) 140/78   Blood Pressure (Exit) 130/82   Heart Rate (Admit) 81 bpm   Heart Rate (Exercise) 96 bpm   Heart Rate (Exit) 89 bpm   Rating of Perceived Exertion (Exercise) 7   Duration Progress to 30 minutes of  aerobic without signs/symptoms of physical distress   Intensity THRR unchanged     Progression   Progression Continue to progress workloads to maintain intensity without signs/symptoms of physical distress.     Resistance Training   Training Prescription Yes   Weight 2   Reps 10-15     Treadmill   MPH 2.6   Grade 0   Minutes 15   METs 2.9     Recumbant Elliptical   Level 2   RPM 69   Watts 109   Minutes 20   METs 5.8     Home Exercise Plan   Plans to  continue exercise at Home (comment)   Frequency Add 2 additional days to program exercise sessions.      Nutrition:  Target Goals: Understanding of nutrition guidelines, daily intake of sodium 1500mg , cholesterol 200mg , calories 30% from fat and 7% or less from saturated fats, daily to have 5 or more servings of fruits and vegetables.  Biometrics:     Pre Biometrics - 07/12/16 1507      Pre Biometrics   Height 5\' 11"  (1.803 m)   Weight 174 lb 13.2 oz (79.3 kg)   Waist Circumference 33 inches   Hip Circumference 38 inches   Waist to Hip Ratio 0.87 %   BMI (Calculated) 24.4   Triceps Skinfold 10 mm   % Body Fat 20.6 %   Grip Strength 92.33 kg   Flexibility 20.17 in   Single Leg Stand 60 seconds       Nutrition Therapy Plan and Nutrition Goals:   Nutrition Discharge: Rate Your Plate Scores:     Nutrition Assessments - 07/12/16 1523      MEDFICTS Scores   Pre Score 45      Nutrition Goals Re-Evaluation:   Nutrition Goals Discharge (Final Nutrition Goals Re-Evaluation):   Psychosocial: Target Goals: Acknowledge presence or absence of significant depression and/or stress, maximize coping skills, provide positive support system. Participant is able to verbalize types and ability to use techniques and skills needed for reducing stress and depression.  Initial Review & Psychosocial Screening:     Initial Psych Review & Screening - 07/12/16 1542      Initial Review   Current issues with None Identified     Family Dynamics   Good Support System? Yes     Barriers   Psychosocial barriers to participate in program Psychosocial barriers identified (see note)     Screening Interventions   Interventions Encouraged to exercise      Quality of Life Scores:     Quality of Life - 07/12/16 1508      Quality of Life Scores   Health/Function Pre 26.57 %  Socioeconomic Pre 25.2 %   Psych/Spiritual Pre 28.29 %   Family Pre 22.8 %   GLOBAL Pre 26.13 %       PHQ-9: Recent Review Flowsheet Data    Depression screen [facility] 2/9 07/12/2016   Decreased Interest 0   Down, Depressed, Hopeless 0   PHQ - 2 Score 0   Altered sleeping 0   Tired, decreased energy 0   Change in appetite 0   Feeling bad or failure about yourself  0   Trouble concentrating 0   Moving slowly or fidgety/restless 0   Suicidal thoughts 0   PHQ-9 Score 0     Interpretation of Total Score  Total Score Depression Severity:  1-4 = Minimal depression, 5-9 = Mild depression, 10-14 = Moderate depression, 15-19 = Moderately severe depression, 20-27 = Severe depression   Psychosocial Evaluation and Intervention:     Psychosocial Evaluation - 07/12/16 1544      Psychosocial Evaluation & Interventions   Interventions Encouraged to exercise with the program and follow exercise prescription   Continue Psychosocial Services  No Follow up required      Psychosocial Re-Evaluation:     Psychosocial Re-Evaluation    [place] Name 08/01/16 1455             Psychosocial Re-Evaluation   Current issues with None Identified       Expected Outcomes Patient will have no psychosocial issues identified at discharge.        Interventions Encouraged to attend Cardiac Rehabilitation for the exercise       Continue Psychosocial Services  No Follow up required          Psychosocial Discharge (Final Psychosocial Re-Evaluation):     Psychosocial Re-Evaluation - 08/01/16 1455      Psychosocial Re-Evaluation   Current issues with None Identified   Expected Outcomes Patient will have no psychosocial issues identified at discharge.    Interventions Encouraged to attend Cardiac Rehabilitation for the exercise   Continue Psychosocial Services  No Follow up required      Vocational Rehabilitation: Provide vocational rehab assistance to qualifying candidates.   Vocational Rehab Evaluation & Intervention:     Vocational Rehab - 07/12/16 1520      Initial Vocational Rehab Evaluation  & Intervention   Assessment shows need for Vocational Rehabilitation No      Education: Education Goals: Education classes will be provided on a weekly basis, covering required topics. Participant will state understanding/return demonstration of topics presented.  Learning Barriers/Preferences:     Learning Barriers/Preferences - 07/12/16 1515      Learning Barriers/Preferences   Learning Barriers None   Learning Preferences Video;Group Instruction;Skilled Demonstration;Pictoral      Education Topics: Hypertension, Hypertension Reduction -Define heart disease and high blood pressure. Discus how high blood pressure affects the body and ways to reduce high blood pressure.   Exercise and Your Heart -Discuss why it is important to exercise, the FITT principles of exercise, normal and abnormal responses to exercise, and how to exercise safely.   Angina -Discuss definition of angina, causes of angina, treatment of angina, and how to decrease risk of having angina.   Cardiac Medications -Review what the following cardiac medications are used for, how they affect the body, and side effects that may occur when taking the medications.  Medications include Aspirin, Beta blockers, calcium channel blockers, ACE Inhibitors, angiotensin receptor blockers, diuretics, digoxin, and antihyperlipidemics.   Congestive Heart Failure -Discuss the definition of CHF, how to  live with CHF, the signs and symptoms of CHF, and how keep track of weight and sodium intake.   Heart Disease and Intimacy -Discus the effect sexual activity has on the heart, how changes occur during intimacy as we age, and safety during sexual activity.   Smoking Cessation / COPD -Discuss different methods to quit smoking, the health benefits of quitting smoking, and the definition of COPD.   Nutrition I: Fats -Discuss the types of cholesterol, what cholesterol does to the heart, and how cholesterol levels can be  controlled.   Nutrition II: Labels -Discuss the different components of food labels and how to read food label   Heart Parts and Heart Disease -Discuss the anatomy of the heart, the pathway of blood circulation through the heart, and these are affected by heart disease.   Stress I: Signs and Symptoms -Discuss the causes of stress, how stress may lead to anxiety and depression, and ways to limit stress.   Stress II: Relaxation -Discuss different types of relaxation techniques to limit stress.   CARDIAC REHAB PHASE II EXERCISE from 08/01/2016 in [place]  Date  07/25/16  Educator  [place]  Instruction Review Code  2- meets goals/outcomes      Warning Signs of Stroke / TIA -Discuss definition of a stroke, what the signs and symptoms are of a stroke, and how to identify when someone is having stroke.   CARDIAC REHAB PHASE II EXERCISE from 08/01/2016 in [place]  Date  08/01/16  Educator  [place]  Instruction Review Code  2- meets goals/outcomes      Knowledge Questionnaire Score:     Knowledge Questionnaire Score - 07/12/16 1520      Knowledge Questionnaire Score   Pre Score 22/24      Core Components/Risk Factors/Patient Goals at Admission:     Personal Goals and Risk Factors at Admission - 07/12/16 1523      Core Components/Risk Factors/Patient Goals on Admission    Weight Management Weight Maintenance   Personal Goal Other Yes   Personal Goal Get stronger and to learn what his exercise perameters are to maintain a healthly lifestyle.    Intervention Attend CR 3 x week and supplement exercise at home 2 x week.    Expected Outcomes Achieve personal goals.       Core Components/Risk Factors/Patient Goals Review:      Goals and Risk Factor Review    Row Name 08/01/16 1453             Core Components/Risk Factors/Patient Goals Review   Personal Goals Review Weight Management/Obesity       Review Patient has completed  6 sessions gaining 1.5 lbs. He is doing well in the program.        Expected Outcomes Patient will complete the program meeting his personal goals.           Core Components/Risk Factors/Patient Goals at Discharge (Final Review):      Goals and Risk Factor Review - 08/01/16 1453      Core Components/Risk Factors/Patient Goals Review   Personal Goals Review Weight Management/Obesity   Review Patient has completed 6 sessions gaining 1.5 lbs. He is doing well in the program.    Expected Outcomes Patient will complete the program meeting his personal goals.       ITP Comments:     ITP Comments    Row Name 07/12/16 1521 07/13/16 1234 08/22/16 1306  ITP Comments Mr. Gary is a 51 year old male that recently had a Heart Transplant. he is doing very well.  Patient new to program. Plans to start Monday 07/16/16. Patient stopped attending after completing 9 sessions d/t personal reasons. MD will be notified.         Comments: Patient stopped coming to Cardiac Rehab on 08/13/2016 after completing 9 sessions d/t personal reasons. Doctor will be informed.

## 2016-08-22 NOTE — Progress Notes (Signed)
Discharge Summary  Patient Details  Name: Patrick Humphrey MRN: [id number] Date of Birth: [birth year] Referring Provider:     CARDIAC REHAB PHASE II ORIENTATION from 07/12/2016 in [place]  Referring Provider  Dr. Mosetta Pigeon       Number of Visits: 9  Reason for Discharge:  Early Exit:  Personal  Smoking History:  History  Smoking Status  . Former Smoker  . Packs/day: 1.00  . Quit date: 02/20/1998  Smokeless Tobacco  . Never Used    Diagnosis:  H/O heart transplant ([address])  ADL UCSD:   Initial Exercise Prescription:     Initial Exercise Prescription - 07/12/16 1500      Date of Initial Exercise RX and Referring Provider   Date 07/12/16   Referring Provider Dr. Mosetta Pigeon     Treadmill   MPH 2.3   Grade 0   Minutes 15   METs 2.7     Recumbant Elliptical   Level 1   RPM 38   Watts 44   Minutes 20   METs 2.5     Prescription Details   Frequency (times per week) 3   Duration Progress to 30 minutes of continuous aerobic without signs/symptoms of physical distress     Intensity   THRR 40-80% of Max Heartrate [phone]   Ratings of Perceived Exertion 11-13   Perceived Dyspnea 0-4     Progression   Progression Continue progressive overload as per policy without signs/symptoms or physical distress.     Resistance Training   Training Prescription Yes   Weight 1   Reps 10-15      Discharge Exercise Prescription (Final Exercise Prescription Changes):     Exercise Prescription Changes - 07/30/16 1400      Response to Exercise   Blood Pressure (Admit) 136/76   Blood Pressure (Exercise) 140/78   Blood Pressure (Exit) 130/82   Heart Rate (Admit) 81 bpm   Heart Rate (Exercise) 96 bpm   Heart Rate (Exit) 89 bpm   Rating of Perceived Exertion (Exercise) 7   Duration Progress to 30 minutes of  aerobic without signs/symptoms of physical distress   Intensity THRR unchanged     Progression   Progression Continue to progress workloads  to maintain intensity without signs/symptoms of physical distress.     Resistance Training   Training Prescription Yes   Weight 2   Reps 10-15     Treadmill   MPH 2.6   Grade 0   Minutes 15   METs 2.9     Recumbant Elliptical   Level 2   RPM 69   Watts 109   Minutes 20   METs 5.8     Home Exercise Plan   Plans to continue exercise at Home (comment)   Frequency Add 2 additional days to program exercise sessions.      Functional Capacity:     6 Minute Walk    Row Name 07/12/16 1506         6 Minute Walk   Phase Initial     Distance 1600 feet     Distance % Change 0 %     Walk Time 6 minutes     # of Rest Breaks 0     MPH 3.03     METS 3.32     RPE 7     Perceived Dyspnea  7     VO2 Peak 17.09     Symptoms No     Resting HR  92 bpm     Resting BP 126/64     Max Ex. HR 100 bpm     Max Ex. BP 152/82     2 Minute Post BP 140/76        Psychological, QOL, Others - Outcomes: PHQ 2/9: Depression screen PHQ 2/9 07/12/2016  Decreased Interest 0  Down, Depressed, Hopeless 0  PHQ - 2 Score 0  Altered sleeping 0  Tired, decreased energy 0  Change in appetite 0  Feeling bad or failure about yourself  0  Trouble concentrating 0  Moving slowly or fidgety/restless 0  Suicidal thoughts 0  PHQ-9 Score 0    Quality of Life:     Quality of Life - 07/12/16 1508      Quality of Life Scores   Health/Function Pre 26.57 %   Socioeconomic Pre 25.2 %   Psych/Spiritual Pre 28.29 %   Family Pre 22.8 %   GLOBAL Pre 26.13 %      Personal Goals: Goals established at orientation with interventions provided to work toward goal.     Personal Goals and Risk Factors at Admission - 07/12/16 1523      Core Components/Risk Factors/Patient Goals on Admission    Weight Management Weight Maintenance   Personal Goal Other Yes   Personal Goal Get stronger and to learn what his exercise perameters are to maintain a healthly lifestyle.    Intervention Attend CR 3 x week and  supplement exercise at home 2 x week.    Expected Outcomes Achieve personal goals.        Personal Goals Discharge:     Goals and Risk Factor Review    Row Name 08/01/16 1453             Core Components/Risk Factors/Patient Goals Review   Personal Goals Review Weight Management/Obesity       Review Patient has completed 6 sessions gaining 1.5 lbs. He is doing well in the program.        Expected Outcomes Patient will complete the program meeting his personal goals.           Nutrition & Weight - Outcomes:     Pre Biometrics - 07/12/16 1507      Pre Biometrics   Height 5\' 11"  (1.803 m)   Weight 174 lb 13.2 oz (79.3 kg)   Waist Circumference 33 inches   Hip Circumference 38 inches   Waist to Hip Ratio 0.87 %   BMI (Calculated) 24.4   Triceps Skinfold 10 mm   % Body Fat 20.6 %   Grip Strength 92.33 kg   Flexibility 20.17 in   Single Leg Stand 60 seconds       Nutrition:   Nutrition Discharge:     Nutrition Assessments - 07/12/16 1523      MEDFICTS Scores   Pre Score 45      Education Questionnaire Score:     Knowledge Questionnaire Score - 07/12/16 1520      Knowledge Questionnaire Score   Pre Score 22/24

## 2016-08-24 ENCOUNTER — Encounter (HOSPITAL_COMMUNITY): Payer: Medicare Other

## 2016-08-27 ENCOUNTER — Encounter (HOSPITAL_COMMUNITY): Payer: Medicare Other

## 2016-08-29 ENCOUNTER — Encounter (HOSPITAL_COMMUNITY): Payer: Medicare Other

## 2016-08-31 ENCOUNTER — Encounter (HOSPITAL_COMMUNITY): Payer: Medicare Other

## 2016-09-03 ENCOUNTER — Encounter (HOSPITAL_COMMUNITY): Payer: Medicare Other

## 2016-09-05 ENCOUNTER — Encounter (HOSPITAL_COMMUNITY): Payer: Medicare Other

## 2016-09-07 ENCOUNTER — Encounter (HOSPITAL_COMMUNITY): Payer: Medicare Other

## 2016-09-10 ENCOUNTER — Encounter (HOSPITAL_COMMUNITY): Payer: Medicare Other

## 2016-09-12 ENCOUNTER — Encounter (HOSPITAL_COMMUNITY): Payer: Medicare Other

## 2016-09-14 ENCOUNTER — Encounter (HOSPITAL_COMMUNITY): Payer: Medicare Other

## 2016-09-17 ENCOUNTER — Encounter (HOSPITAL_COMMUNITY): Payer: Medicare Other

## 2016-09-19 ENCOUNTER — Encounter (HOSPITAL_COMMUNITY): Payer: Medicare Other

## 2016-09-21 ENCOUNTER — Encounter (HOSPITAL_COMMUNITY): Payer: Medicare Other

## 2016-09-24 ENCOUNTER — Encounter (HOSPITAL_COMMUNITY): Payer: Medicare Other

## 2016-09-26 ENCOUNTER — Encounter (HOSPITAL_COMMUNITY): Payer: Medicare Other

## 2016-09-28 ENCOUNTER — Encounter (HOSPITAL_COMMUNITY): Payer: Medicare Other

## 2016-10-01 ENCOUNTER — Encounter (HOSPITAL_COMMUNITY): Payer: Medicare Other

## 2016-10-03 ENCOUNTER — Encounter (HOSPITAL_COMMUNITY): Payer: Medicare Other

## 2016-10-05 ENCOUNTER — Encounter (HOSPITAL_COMMUNITY): Payer: Medicare Other

## 2016-11-21 ENCOUNTER — Other Ambulatory Visit (HOSPITAL_COMMUNITY)
Admission: RE | Admit: 2016-11-21 | Discharge: 2016-11-21 | Disposition: A | Discharge status: Home / Self Care | Payer: Medicare Other | Source: Ambulatory Visit | Attending: Internal Medicine | Admitting: Internal Medicine

## 2016-11-21 DIAGNOSIS — Z4821 Encounter for aftercare following heart transplant: Secondary | ICD-10-CM | POA: Insufficient documentation

## 2016-11-21 LAB — BASIC METABOLIC PANEL
Anion gap: 7 (ref 5–15)
BUN: 31 mg/dL — ABNORMAL HIGH (ref 6–20)
CO2: 24 mmol/L (ref 22–32)
Calcium: 9.3 mg/dL (ref 8.9–10.3)
Chloride: 105 mmol/L (ref 101–111)
Creatinine, Ser: 2.02 mg/dL — ABNORMAL HIGH (ref 0.61–1.24)
GFR calc Af Amer: 42 mL/min — ABNORMAL LOW (ref >60)
GFR calc non Af Amer: 36 mL/min — ABNORMAL LOW (ref >60)
Glucose, Bld: 91 mg/dL (ref 65–99)
Potassium: 5.2 mmol/L — ABNORMAL HIGH (ref 3.5–5.1)
Sodium: 136 mmol/L (ref 135–145)

## 2016-11-26 LAB — TACROLIMUS LEVEL: Tacrolimus (FK506) - LabCorp: 25.4 ng/mL — ABNORMAL HIGH (ref 2.0–20.0)

## 2017-03-27 ENCOUNTER — Other Ambulatory Visit (HOSPITAL_COMMUNITY)
Admission: RE | Admit: 2017-03-27 | Discharge: 2017-03-27 | Disposition: A | Discharge status: Home / Self Care | Payer: Medicare Other | Source: Ambulatory Visit | Attending: Internal Medicine | Admitting: Internal Medicine

## 2017-03-27 DIAGNOSIS — Z941 Heart transplant status: Secondary | ICD-10-CM | POA: Diagnosis present

## 2017-03-27 LAB — BASIC METABOLIC PANEL
Anion gap: 9 (ref 5–15)
BUN: 26 mg/dL — ABNORMAL HIGH (ref 6–20)
CO2: 25 mmol/L (ref 22–32)
Calcium: 9.4 mg/dL (ref 8.9–10.3)
Chloride: 106 mmol/L (ref 101–111)
Creatinine, Ser: 1.65 mg/dL — ABNORMAL HIGH (ref 0.61–1.24)
GFR calc Af Amer: 54 mL/min — ABNORMAL LOW (ref >60)
GFR calc non Af Amer: 46 mL/min — ABNORMAL LOW (ref >60)
Glucose, Bld: 84 mg/dL (ref 65–99)
Potassium: 4.4 mmol/L (ref 3.5–5.1)
Sodium: 140 mmol/L (ref 135–145)

## 2017-03-28 LAB — TACROLIMUS LEVEL: Tacrolimus (FK506) - LabCorp: 11.2 ng/mL (ref 2.0–20.0)

## 2017-05-01 ENCOUNTER — Other Ambulatory Visit (HOSPITAL_COMMUNITY)
Admission: RE | Admit: 2017-05-01 | Discharge: 2017-05-01 | Disposition: A | Discharge status: Home / Self Care | Payer: Medicare Other | Source: Ambulatory Visit | Attending: Cardiology | Admitting: Cardiology

## 2017-05-01 DIAGNOSIS — Z941 Heart transplant status: Secondary | ICD-10-CM | POA: Insufficient documentation

## 2017-05-01 LAB — BASIC METABOLIC PANEL
Anion gap: 12 (ref 5–15)
BUN: 28 mg/dL — ABNORMAL HIGH (ref 6–20)
CO2: 24 mmol/L (ref 22–32)
Calcium: 9.3 mg/dL (ref 8.9–10.3)
Chloride: 104 mmol/L (ref 101–111)
Creatinine, Ser: 1.79 mg/dL — ABNORMAL HIGH (ref 0.61–1.24)
GFR calc Af Amer: 49 mL/min — ABNORMAL LOW (ref >60)
GFR calc non Af Amer: 42 mL/min — ABNORMAL LOW (ref >60)
Glucose, Bld: 91 mg/dL (ref 65–99)
Potassium: 4.5 mmol/L (ref 3.5–5.1)
Sodium: 140 mmol/L (ref 135–145)

## 2017-05-03 LAB — TACROLIMUS LEVEL: Tacrolimus (FK506) - LabCorp: 13.1 ng/mL (ref 2.0–20.0)

## 2017-05-14 IMAGING — DX DG CHEST 2V
2 series · 2 of 2 positions shown · non-contrast
Comparison: Chest x-rays dated 04/23/2015 and 01/06/2015.

CLINICAL DATA: Cough, allergy induced asthma, history of pacemaker,
CHF, nonsmoker.

EXAM:
CHEST  2 VIEW

[chest pa]
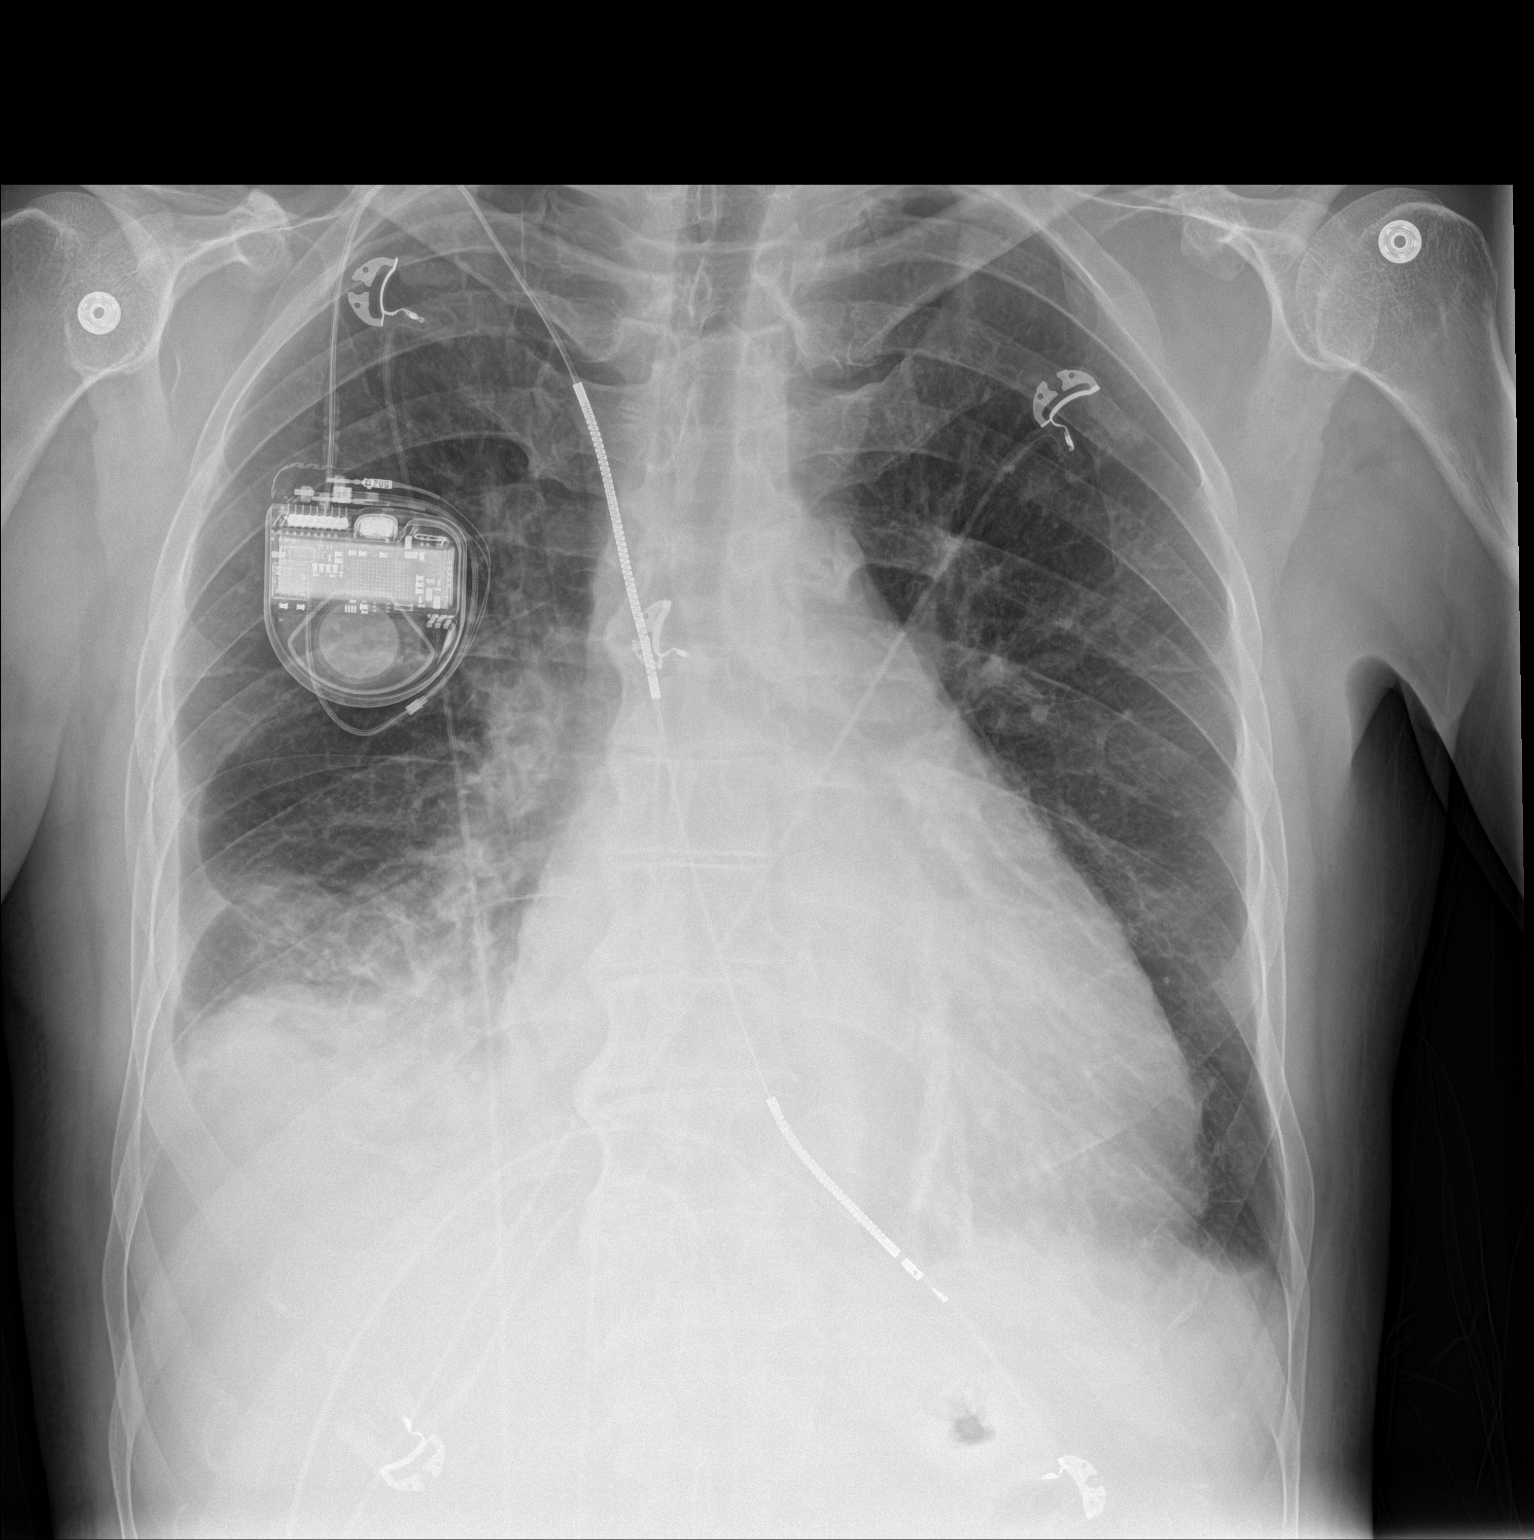

[chest lat]
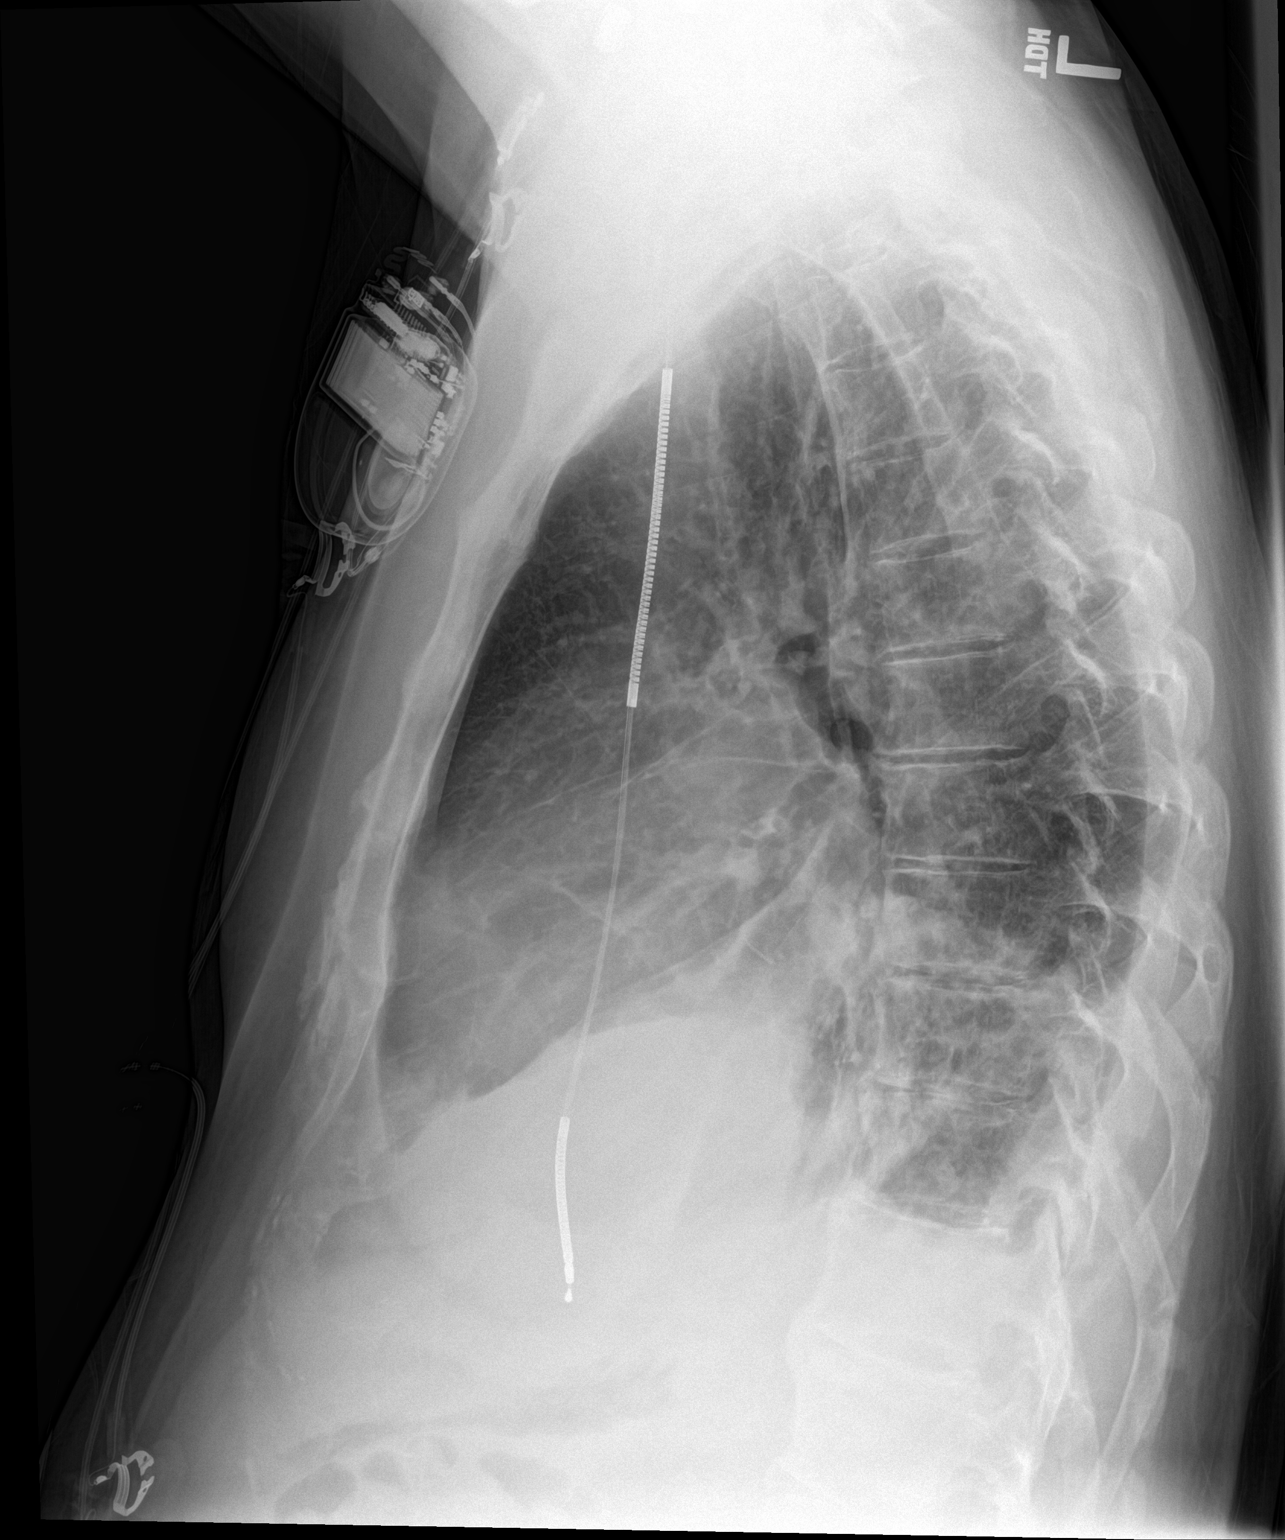

[2 of 2 positions shown; findings below may reference images not displayed]

FINDINGS: New airspace opacity is present at the right lung base. Suspect
small adjacent right pleural effusion. Chronic mild
atelectasis/scarring noted at the left lung base.

Mild cardiomegaly is unchanged. Right chest wall pacemaker/AICD is
stable in position. No acute osseous abnormality seen.
IMPRESSION: 1. New airspace opacity at the right lung base. This could represent
atelectasis, pneumonia or aspiration. If febrile, would certainly
favor pneumonia. Suspect small adjacent right pleural effusion.
2. Cardiomegaly, stable. No evidence of significant volume
overload/CHF.

## 2017-05-23 IMAGING — CR DG CHEST 1V PORT
1 series · 1 of 1 positions shown · non-contrast
Comparison: CT 05/06/2015.  Chest x-ray 04/30/2015.

CLINICAL DATA: Shortness of breath.

EXAM:
PORTABLE CHEST 1 VIEW

[AP]
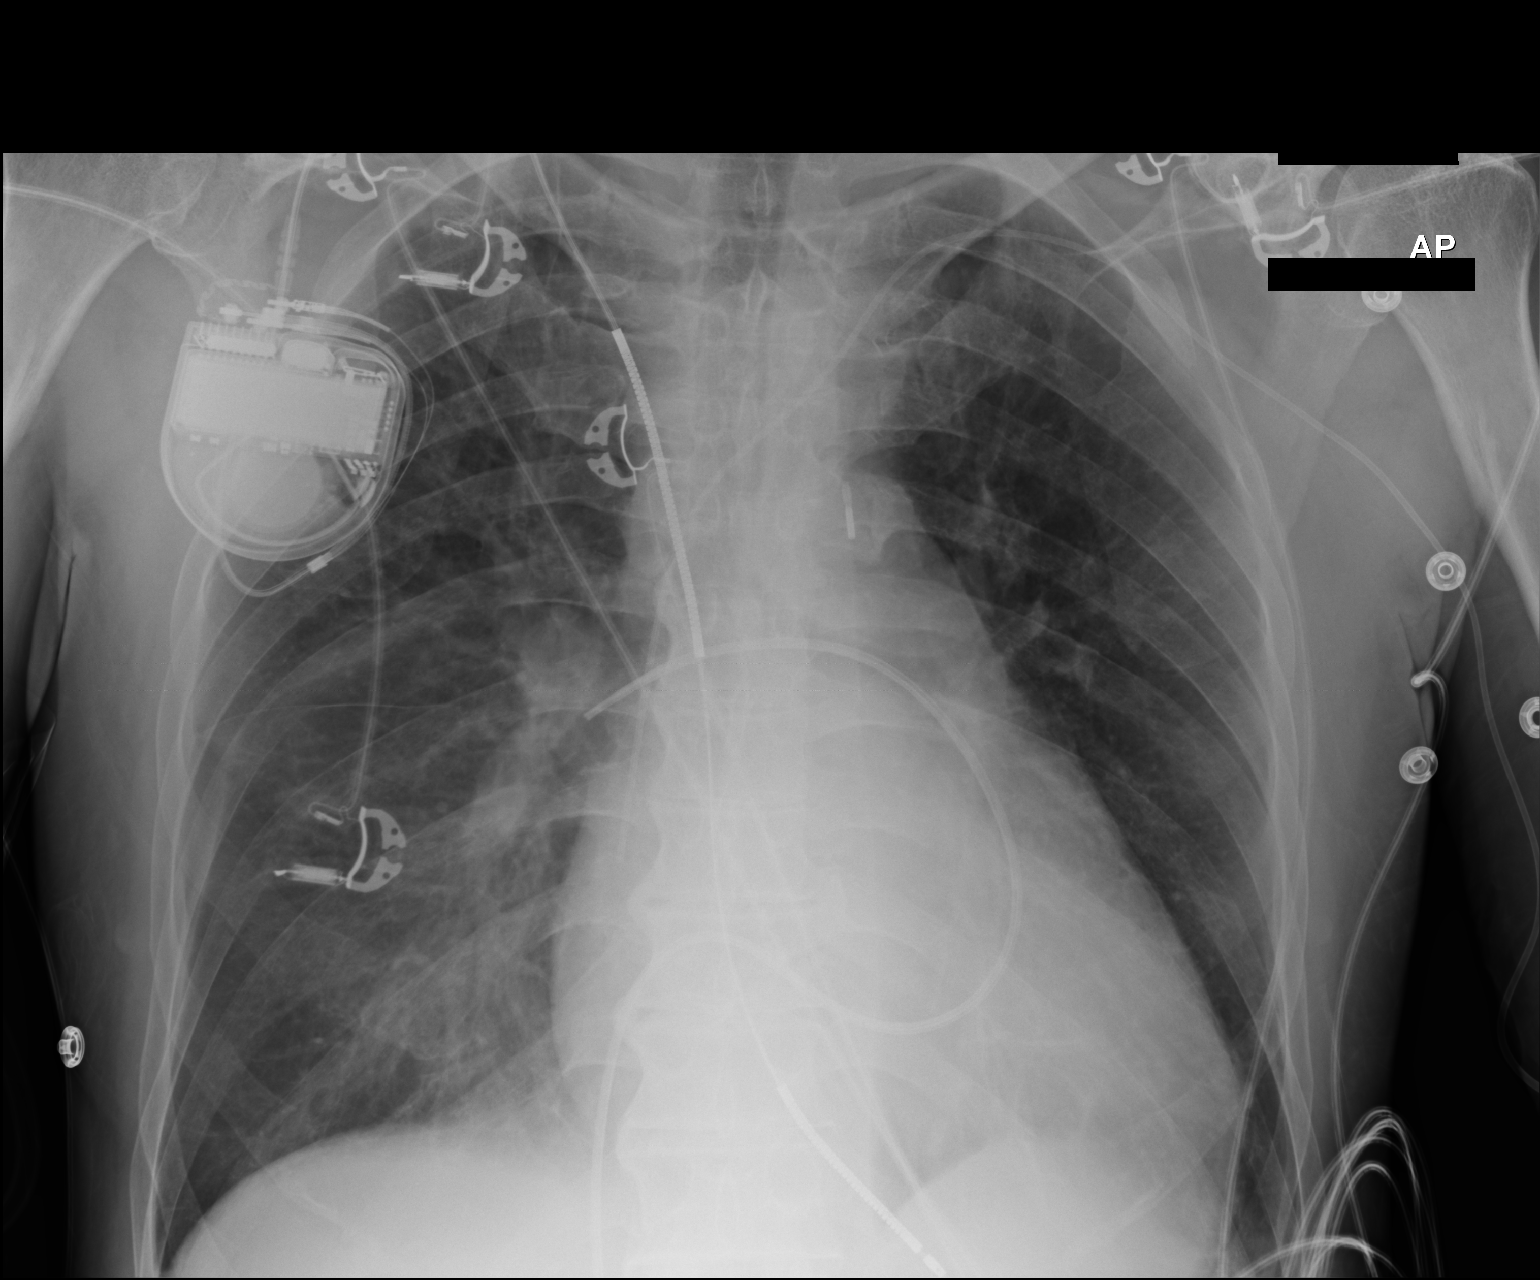

[1 of 1 positions shown; findings below may reference images not displayed]

FINDINGS: Swan-Ganz catheter with tip in the right main pulmonary artery. Left
PICC line noted with its tip at the cavoatrial junction. Persistent
right lower lobe infiltrate consistent pneumonia. No pleural
effusion or pneumothorax. Cardiac pacer noted with lead tip
projected over the right ventricle. Stable cardiomegaly .
IMPRESSION: 1. Swan-Ganz catheter with tip in the right main pulmonary artery.
Left PICC line noted with tip at the cavoatrial junction.
2. Cardiac pacer in stable position. Stable cardiomegaly. No
pulmonary venous congestion.
3. Persistent right lower lobe infiltrate consistent pneumonia. No
significant interim change.

## 2017-05-24 IMAGING — CR DG CHEST 1V PORT
2 series · 2 of 2 positions shown · non-contrast
Comparison: 05/09/2015

CLINICAL DATA: Heart failure

EXAM:
PORTABLE CHEST 1 VIEW

[AP (1 of 2)]
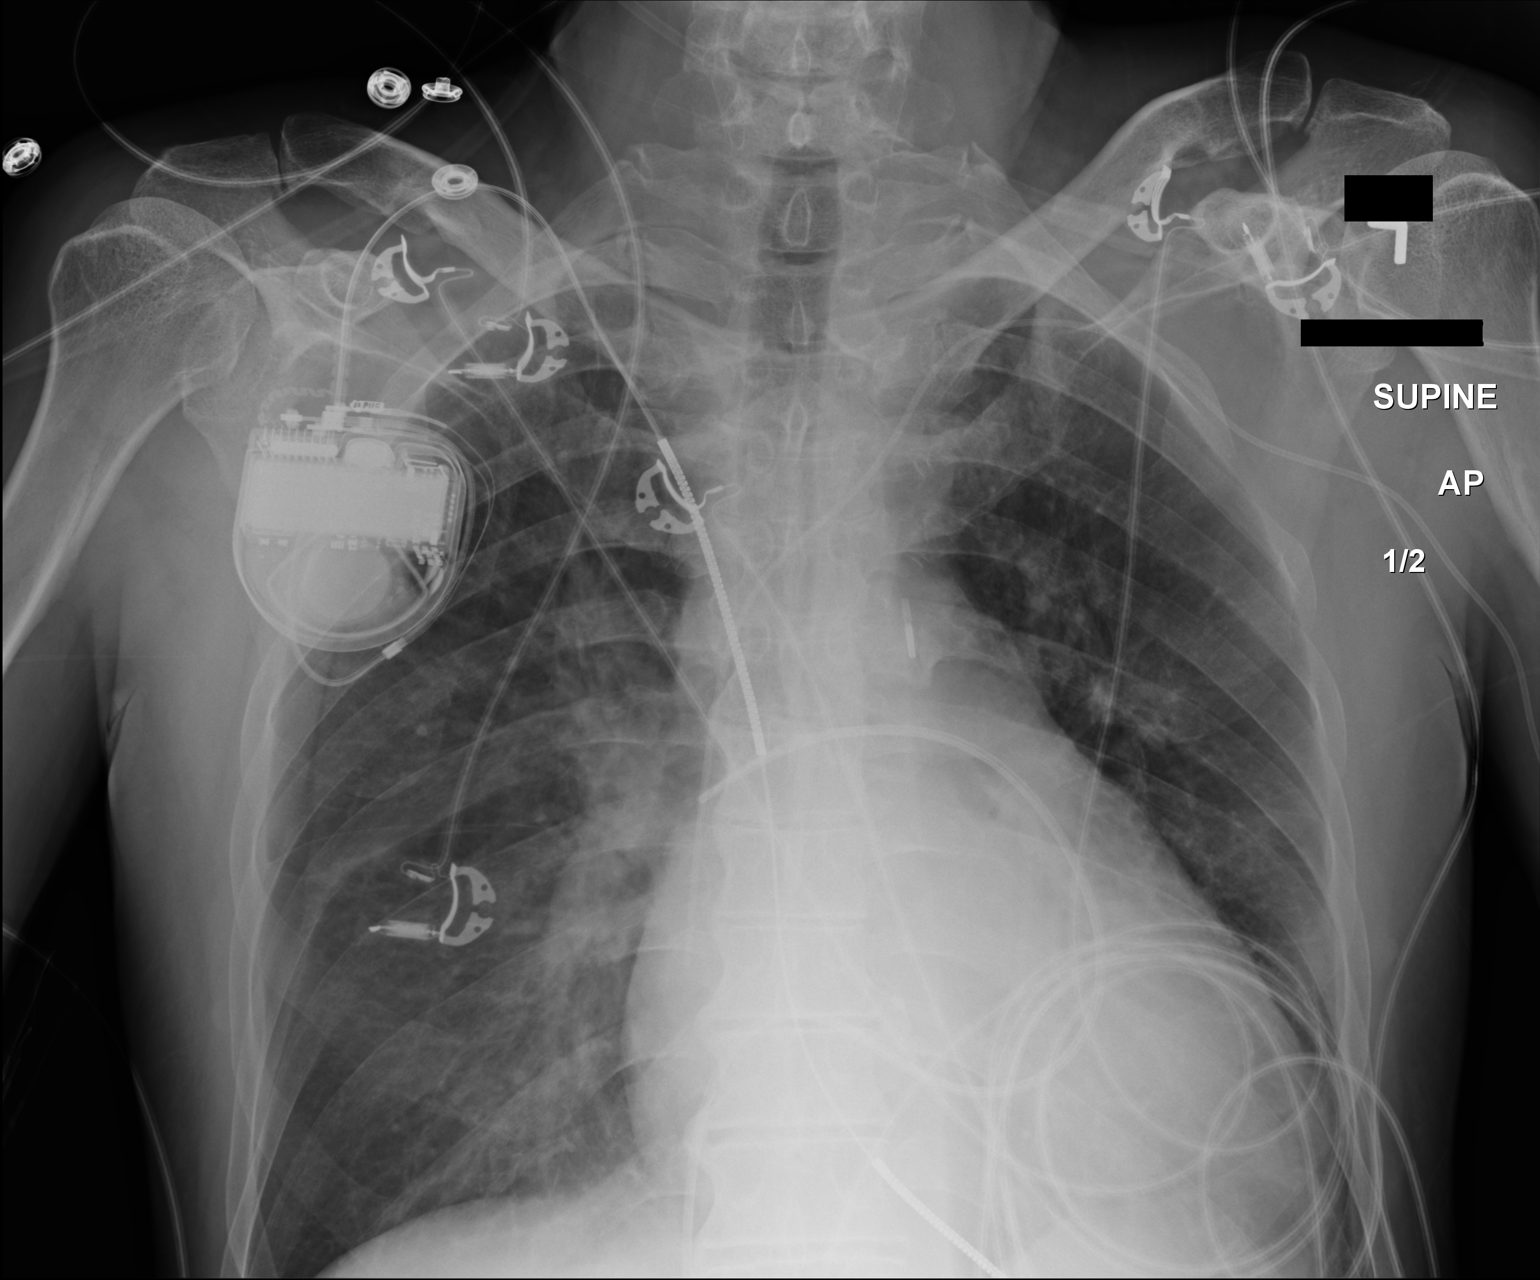

[AP (2 of 2)]
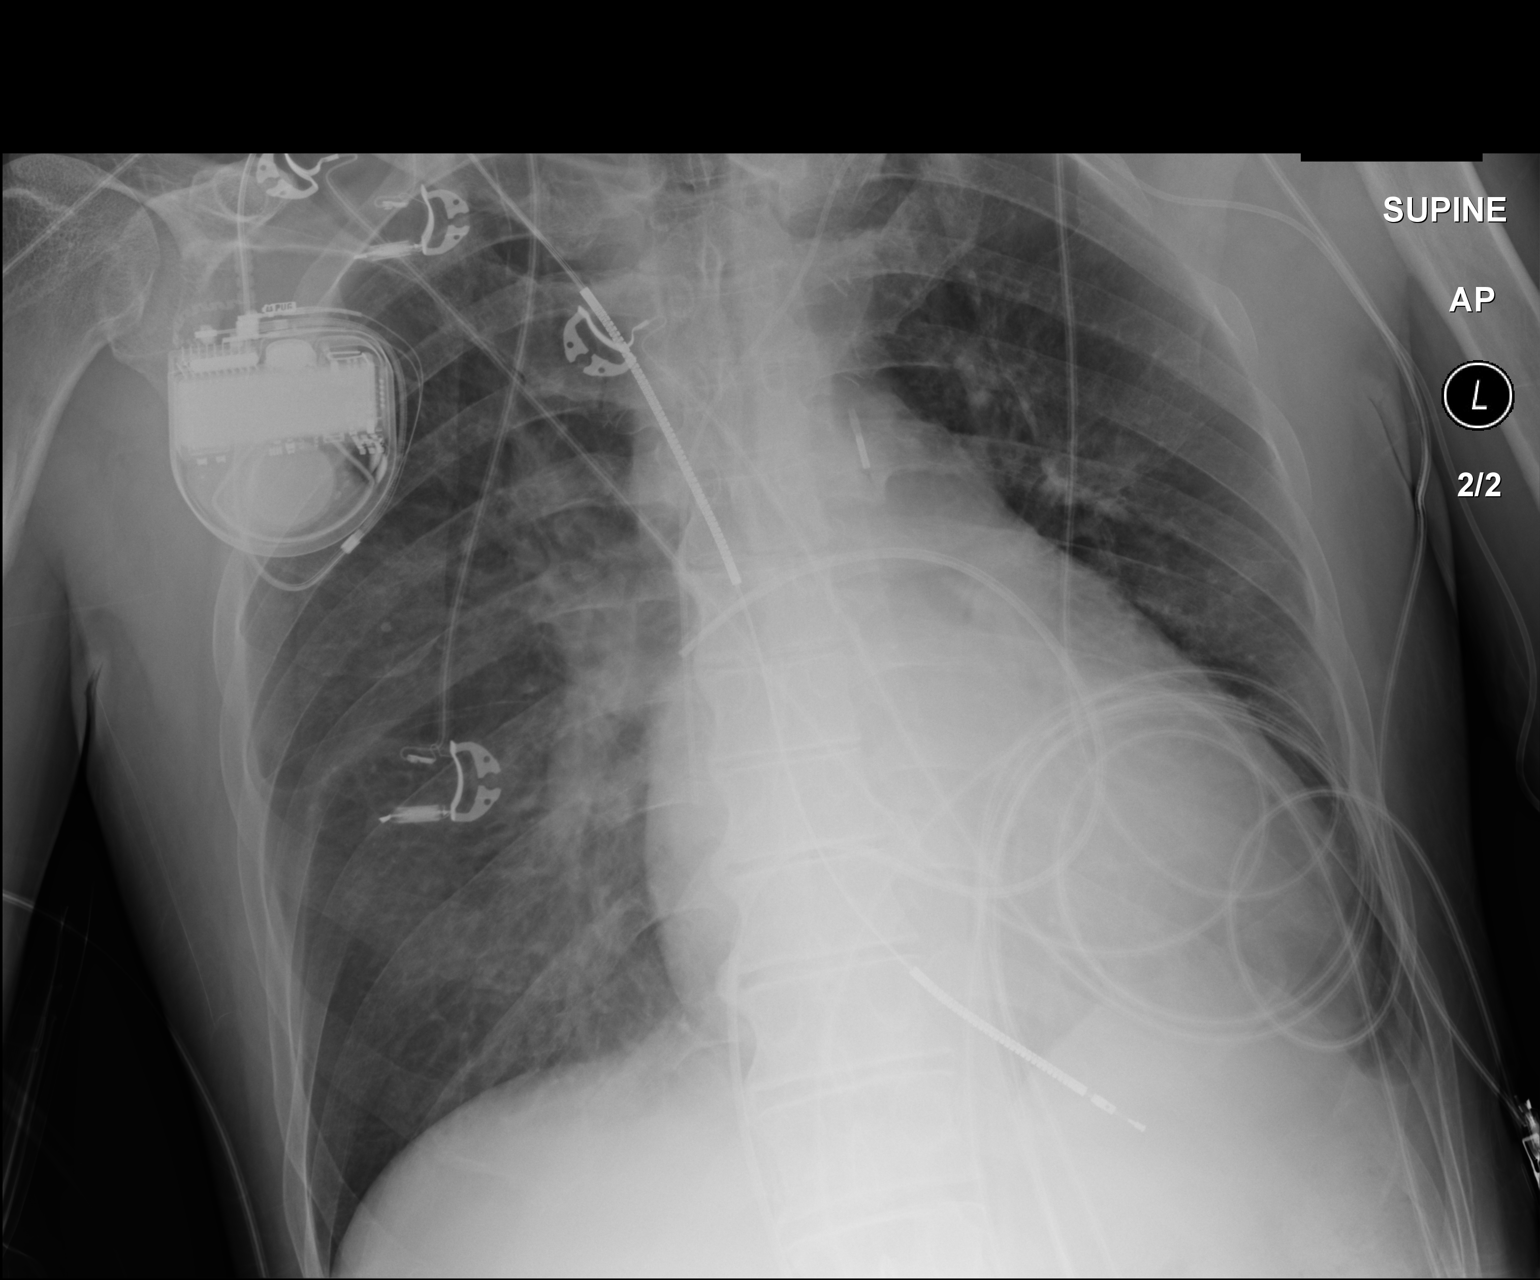

[2 of 2 positions shown; findings below may reference images not displayed]

FINDINGS: Progression of vascular congestion with mild interstitial edema. No
significant pleural effusion.

Femoral approach Swan-Ganz catheter tip in the right lower pulmonary
artery unchanged. Left subclavian central venous catheter tip in the
mid right atrium unchanged. AICD unchanged. Intra-aortic balloon
pump at the aortic arch unchanged.
IMPRESSION: Mild progression of interstitial edema.

## 2017-08-19 ENCOUNTER — Other Ambulatory Visit (HOSPITAL_COMMUNITY)
Admission: RE | Admit: 2017-08-19 | Discharge: 2017-08-19 | Disposition: A | Discharge status: Home / Self Care | Payer: Medicare Other | Source: Ambulatory Visit | Attending: Internal Medicine | Admitting: Internal Medicine

## 2017-08-19 DIAGNOSIS — Z941 Heart transplant status: Secondary | ICD-10-CM | POA: Diagnosis present

## 2017-08-19 LAB — BASIC METABOLIC PANEL
Anion gap: 6 (ref 5–15)
BUN: 28 mg/dL — ABNORMAL HIGH (ref 6–20)
CO2: 27 mmol/L (ref 22–32)
Calcium: 8.8 mg/dL — ABNORMAL LOW (ref 8.9–10.3)
Chloride: 104 mmol/L (ref 101–111)
Creatinine, Ser: 1.76 mg/dL — ABNORMAL HIGH (ref 0.61–1.24)
GFR calc Af Amer: 50 mL/min — ABNORMAL LOW (ref >60)
GFR calc non Af Amer: 43 mL/min — ABNORMAL LOW (ref >60)
Glucose, Bld: 76 mg/dL (ref 65–99)
Potassium: 4.7 mmol/L (ref 3.5–5.1)
Sodium: 137 mmol/L (ref 135–145)

## 2017-08-20 LAB — TACROLIMUS LEVEL: Tacrolimus (FK506) - LabCorp: 7.8 ng/mL (ref 2.0–20.0)

## 2017-10-18 ENCOUNTER — Encounter: Payer: Self-pay | Admitting: Cardiology

## 2018-01-04 ENCOUNTER — Encounter (HOSPITAL_COMMUNITY): Payer: Self-pay | Admitting: Emergency Medicine

## 2018-01-04 ENCOUNTER — Emergency Department (HOSPITAL_COMMUNITY): Payer: Medicare Other

## 2018-01-04 ENCOUNTER — Emergency Department (HOSPITAL_COMMUNITY)
Admission: EM | Admit: 2018-01-04 | Discharge: 2018-01-04 | Disposition: A | Discharge status: Home / Self Care | Payer: Medicare Other | Attending: Emergency Medicine | Admitting: Emergency Medicine

## 2018-01-04 ENCOUNTER — Other Ambulatory Visit: Payer: Self-pay

## 2018-01-04 DIAGNOSIS — I509 Heart failure, unspecified: Secondary | ICD-10-CM | POA: Insufficient documentation

## 2018-01-04 DIAGNOSIS — Z87891 Personal history of nicotine dependence: Secondary | ICD-10-CM | POA: Diagnosis not present

## 2018-01-04 DIAGNOSIS — J189 Pneumonia, unspecified organism: Secondary | ICD-10-CM | POA: Diagnosis not present

## 2018-01-04 DIAGNOSIS — R509 Fever, unspecified: Secondary | ICD-10-CM | POA: Diagnosis present

## 2018-01-04 LAB — CBC WITH DIFFERENTIAL/PLATELET
Basophils Absolute: 0 10*3/uL (ref 0.0–0.1)
Basophils Relative: 1 %
Eosinophils Absolute: 0.1 10*3/uL (ref 0.0–0.7)
Eosinophils Relative: 2 %
HCT: 36.4 % — ABNORMAL LOW (ref 39.0–52.0)
Hemoglobin: 12 g/dL — ABNORMAL LOW (ref 13.0–17.0)
Lymphocytes Relative: 17 %
Lymphs Abs: 0.7 10*3/uL (ref 0.7–4.0)
MCH: 29.9 pg (ref 26.0–34.0)
MCHC: 33 g/dL (ref 30.0–36.0)
MCV: 90.5 fL (ref 78.0–100.0)
Monocytes Absolute: 0.7 10*3/uL (ref 0.1–1.0)
Monocytes Relative: 17 %
Neutro Abs: 2.5 10*3/uL (ref 1.7–7.7)
Neutrophils Relative %: 63 %
Platelets: 156 10*3/uL (ref 150–400)
RBC: 4.02 MIL/uL — ABNORMAL LOW (ref 4.22–5.81)
RDW: 13 % (ref 11.5–15.5)
WBC: 4 10*3/uL (ref 4.0–10.5)

## 2018-01-04 LAB — BASIC METABOLIC PANEL
Anion gap: 7 (ref 5–15)
BUN: 21 mg/dL — ABNORMAL HIGH (ref 6–20)
CO2: 25 mmol/L (ref 22–32)
Calcium: 8.7 mg/dL — ABNORMAL LOW (ref 8.9–10.3)
Chloride: 104 mmol/L (ref 98–111)
Creatinine, Ser: 1.63 mg/dL — ABNORMAL HIGH (ref 0.61–1.24)
GFR calc Af Amer: 54 mL/min — ABNORMAL LOW (ref >60)
GFR calc non Af Amer: 47 mL/min — ABNORMAL LOW (ref >60)
Glucose, Bld: 110 mg/dL — ABNORMAL HIGH (ref 70–99)
Potassium: 4.1 mmol/L (ref 3.5–5.1)
Sodium: 136 mmol/L (ref 135–145)

## 2018-01-04 LAB — TROPONIN I: Troponin I: <0.03 ng/mL (ref <0.03)

## 2018-01-04 LAB — INFLUENZA PANEL BY PCR (TYPE A & B)
Influenza A By PCR: NEGATIVE
Influenza B By PCR: NEGATIVE

## 2018-01-04 MED ORDER — LEVOFLOXACIN 750 MG PO TABS: 750.0000 mg | ORAL_TABLET | Freq: Every day | ORAL | 0 refills | Status: DC

## 2018-01-04 MED ORDER — LEVOFLOXACIN 750 MG PO TABS
750.0000 mg | ORAL_TABLET | Freq: Once | ORAL | Status: AC
  Administered 2018-01-04: 750 mg via ORAL
  Filled 2018-01-04: qty 1

## 2018-01-04 NOTE — ED Triage Notes (Signed)
Pt is a heart transplant receipt. From 1/18  Continues on anti rejection drugs  Awakened this am with a fever    Last tylenol 1 hour ago  Called transplant co ordinator at [facility] and have blood cultures

## 2018-01-04 NOTE — ED Triage Notes (Signed)
Transplant surgeon Dr Louann Liv [facility]  Danae Chen transplant co ordinator [phone]

## 2018-01-04 NOTE — Discharge Instructions (Signed)
Start the antibiotic prescription tomorrow.  Use Tylenol every 4 hours as needed for fever.  The [facility] transplant coordinator will monitor your blood cultures, and arrange for further care as needed.

## 2018-01-04 NOTE — ED Provider Notes (Signed)
[facility] EMERGENCY DEPARTMENT Provider Note   CSN: [id number] Arrival date & time: 01/04/18  1825     History   Chief Complaint Chief Complaint  Patient presents with  . SP heart transplant  . Fever    HPI Patrick Humphrey is a 52 y.o. male.  HPI   He presents for evaluation of fever.  Fever started this morning initially 100.9, later today 102 degrees.  He is taken Tylenol twice, with improvement.  He reports mild nasal congestion which is ongoing, and a slight cough which is nonproductive, currently.  He denies nausea, vomiting, diarrhea, dysuria, hematuria, weakness or paresthesia.  Is not yet had a flu immunization, this year.  He is currently taking anti-rejection drugs for a cardiac transplant.  No known sick contacts.  There are no other known modifying factors.  Past Medical History:  Diagnosis Date  . AICD (automatic cardioverter/defibrillator) present   . CHF (congestive heart failure) ([place])   . Hx of echocardiogram 11/09/1910   Showed an EF of 25% with no significant valve disease.  . ICD (implantable cardioverter-defibrillator), single, in situ 08/26/2007  . Nonischemic cardiomyopathy ([place])   . Presence of permanent cardiac pacemaker     Patient Active Problem List   Diagnosis Date Noted  . Chronic systolic CHF (congestive heart failure) ([place]) 06/14/2015  . Paroxysmal atrial fibrillation ([place]) 06/14/2015  . LVAD (left ventricular assist device) present ([place]) 05/10/2015  . Palliative care encounter   . Cardiogenic shock ([place])   . Acute on chronic systolic CHF (congestive heart failure) ([place]) 04/29/2015  . Elevated liver enzymes 03/17/2015  . ICD - Medtronic Maximo II VR 2009 10/21/2013  . Nonischemic cardiomyopathy ([place]) 02/20/2013    Past Surgical History:  Procedure Laterality Date  . APPENDECTOMY    . CARDIAC CATHETERIZATION  2002   Normal coronary arteries  . CARDIAC CATHETERIZATION N/A 05/04/2015   Procedure: Right/Left Heart Cath and Coronary  Angiography;  Surgeon: Larey Dresser, MD;  Location: [place] CV LAB;  Service: Cardiovascular;  Laterality: N/A;  . CARDIAC CATHETERIZATION N/A 05/08/2015   Procedure: Right Heart Cath;  Surgeon: Jolaine Artist, MD;  Location: [place] CV LAB;  Service: Cardiovascular;  Laterality: N/A;  . CARDIAC CATHETERIZATION N/A 05/08/2015   Procedure: IABP Insertion;  Surgeon: Jolaine Artist, MD;  Location: [place] CV LAB;  Service: Cardiovascular;  Laterality: N/A;  . CARDIOVERSION N/A 05/18/2015   Procedure: CARDIOVERSION WITH TEE;  Surgeon: Larey Dresser, MD;  Location: [place];  Service: Cardiovascular;  Laterality: N/A;  . HEART TRANSPLANT    . INSERTION OF IMPLANTABLE LEFT VENTRICULAR ASSIST DEVICE N/A 05/10/2015   Procedure: INSERTION OF IMPLANTABLE LEFT VENTRICULAR ASSIST DEVICE;  Surgeon: Ivin Poot, MD;  Location: [place];  Service: Open Heart Surgery;  Laterality: N/A;  CIRC ARREST  NITRIC OXIDE  . PACEMAKER INSERTION  08/26/2007   place by Dr. Alroy Dust at [facility]  . [place] 05/10/2015   Procedure: TRANSESOPHAGEAL ECHOCARDIOGRAM (TEE);  Surgeon: Ivin Poot, MD;  Location: [place];  Service: Open Heart Surgery;  Laterality: N/A;        Home Medications    Prior to Admission medications   Medication Sig Start Date End Date Taking? Authorizing Provider  amLODipine (NORVASC) 2.5 MG tablet Take 2.5 mg by mouth daily.    Yes [provider]  aspirin EC 81 MG tablet Take 81 mg by mouth daily.   Yes [provider]  calcium-vitamin  D (OSCAL WITH D) 500-200 MG-UNIT tablet Take 1 tablet by mouth 2 (two) times daily.   Yes [provider]  cetirizine (ZYRTEC) 10 MG tablet Take 10 mg by mouth daily.   Yes [provider]  fluticasone (FLONASE) 50 MCG/ACT nasal spray Place 2 sprays into both nostrils daily.    Yes [provider]  folic acid (FOLVITE) 1 MG tablet Take 1 mg by mouth daily.   Yes [provider]  mycophenolate (MYFORTIC) 360 MG TBEC EC tablet Take 360 mg by mouth 2 (two) times daily.   Yes [provider]  pantoprazole (PROTONIX) 40 MG tablet Take 40 mg by mouth 2 (two) times daily.    Yes [provider]  pravastatin (PRAVACHOL) 20 MG tablet Take 20 mg by mouth daily at 6 PM.   Yes [provider]  tacrolimus (PROGRAF) 1 MG capsule Take 3 mg by mouth 2 (two) times daily.    Yes [provider]  tamsulosin (FLOMAX) 0.4 MG CAPS capsule Take 0.4 mg by mouth daily.   Yes [provider]  vitamin B-12 (CYANOCOBALAMIN) 1000 MCG tablet Take 1,000 mcg by mouth daily.   Yes [provider]  levofloxacin (LEVAQUIN) 750 MG tablet Take 1 tablet (750 mg total) by mouth daily. X 7 days 01/04/18   Daleen Bo, MD    Family History Family History  Problem Relation Age of Onset  . Hypertension Mother   . Hypertension Father   . Cancer - Prostate Maternal Grandfather     Social History Social History   Tobacco Use  . Smoking status: Former Smoker    Packs/day: 1.00    Last attempt to quit: 02/20/1998    Years since quitting: 19.8  . Smokeless tobacco: Never Used  Substance Use Topics  . Alcohol use: Yes    Alcohol/week: 0.0 standard drinks    Comment: has quit drinking x 1 1/2 months. He sporadically drinks  . Drug use: No     Allergies   Penicillins and Sulfa antibiotics   Review of Systems Review of Systems  All other systems reviewed and are negative.    Physical Exam Updated Vital Signs BP 126/75   Pulse 94   Temp 99.3 F (37.4 C) (Oral)   Resp 15   Ht 5\' 11"  (1.803 m)   Wt 72.6 kg   SpO2 97%   BMI 22.32 kg/m   Physical Exam  Constitutional: He is oriented to person, place, and time. He appears well-developed and well-nourished. No distress.  HENT:  Head: Normocephalic and atraumatic.  Right Ear: External ear normal.  Left Ear: External ear normal.  Eyes: Pupils are equal, round, and  reactive to light. Conjunctivae and EOM are normal.  Neck: Normal range of motion and phonation normal. Neck supple.  Cardiovascular: Normal rate, regular rhythm and normal heart sounds.  Pulmonary/Chest: Effort normal and breath sounds normal. He exhibits no bony tenderness.  Abdominal: Soft. There is no tenderness.  Musculoskeletal: Normal range of motion.  Neurological: He is alert and oriented to person, place, and time. No cranial nerve deficit or sensory deficit. He exhibits normal muscle tone. Coordination normal.  Skin: Skin is warm, dry and intact.  Psychiatric: He has a normal mood and affect. His behavior is normal. Judgment and thought content normal.  Nursing note and vitals reviewed.    ED Treatments / Results  Labs (all labs ordered are listed, but only abnormal results are displayed) Labs Reviewed  CBC WITH  DIFFERENTIAL/PLATELET - Abnormal; Notable for the following components:      Result Value   RBC 4.02 (*)    Hemoglobin 12.0 (*)    HCT 36.4 (*)    All other components within normal limits  BASIC METABOLIC PANEL - Abnormal; Notable for the following components:   Glucose, Bld 110 (*)    BUN 21 (*)    Creatinine, Ser 1.63 (*)    Calcium 8.7 (*)    GFR calc non Af Amer 47 (*)    GFR calc Af Amer 54 (*)    All other components within normal limits  CULTURE, BLOOD (ROUTINE X 2)  CULTURE, BLOOD (ROUTINE X 2)  TROPONIN I  INFLUENZA PANEL BY PCR (TYPE A & B)    EKG None  Radiology Dg Chest 2 View  Result Date: 01/04/2018 CLINICAL DATA:  Fever.  Heart transplant recipient. EXAM: CHEST - 2 VIEW COMPARISON:  May 28, 2015 FINDINGS: Opacity in left base may simply represent scarring and/or atelectasis. The cardiomediastinal silhouette is stable. No other acute interval changes. IMPRESSION: Mild opacity in the lingula is favored to represent scar/atelectasis. No convincing infiltrate. Electronically Signed   By: Dorise Bullion III M.D   On: 01/04/2018 20:14     Procedures Procedures (including critical care time)  Medications Ordered in ED Medications  levofloxacin (LEVAQUIN) tablet 750 mg (750 mg Oral Given 01/04/18 2144)     Initial Impression / Assessment and Plan / ED Course  I have reviewed the triage vital signs and the nursing notes.  Pertinent labs & imaging results that were available during my care of the patient were reviewed by me and considered in my medical decision making (see chart for details).  Clinical Course as of Jan 05 2223  Sat Jan 04, 2018  2028 Normal  Troponin I [EW]  2028 Normal except hemoglobin low  CBC with Differential(!) [EW]  2028 Normal except glucose high, BUN high, creatinine high, calcium low, GFR low  Basic metabolic panel(!) [EW]  0258 No CHF or clear infiltrate.  Images reviewed by me.  DG Chest 2 View [EW]  2221 I discussed the case with Baxter Flattery, the transplant coordinator at [facility].  She and her team will follow the blood cultures and arrange for further care for the patient as needed.  She understands the patient is being treated with Levaquin for possible pneumonia.   [EW]    Clinical Course User Index [EW] Daleen Bo, MD     Patient Vitals for the past 24 hrs:  BP Temp Temp src Pulse Resp SpO2 Height Weight  01/04/18 2138 - 99.3 F (37.4 C) Oral - - - - -  01/04/18 1900 126/75 - - 94 15 97 % - -  01/04/18 1840 113/69 99.1 F (37.3 C) Oral 96 16 97 % - -  01/04/18 1839 - - - - - - 5\' 11"  (1.803 m) 72.6 kg    10:22 PM Reevaluation with update and discussion. After initial assessment and treatment, an updated evaluation reveals he remains comfortable.  He and his wife are updated on findings and plan.  Influenza test is pending at this time.Daleen Bo    10:24 PM-influenza test negative.  Medical Decision Making: Fever without clear source.  Possible pneumonia.  Consider viral infection.  No hemodynamic or metabolic instability.  Patient stable for  discharge.  CRITICAL CARE-no Performed by: Daleen Bo   Nursing Notes Reviewed/ Care Coordinated Applicable Imaging Reviewed Interpretation of Laboratory Data incorporated  into ED treatment  The patient appears reasonably screened and/or stabilized for discharge and I doubt any other medical condition or other [facility] requiring further screening, evaluation, or treatment in the ED at this time prior to discharge.  Plan: Home Medications-Tylenol for fever, current medications; Home Treatments-rest, fluids; return here if the recommended treatment, does not improve the symptoms; Recommended follow up-cardiology as scheduled     Final Clinical Impressions(s) / ED Diagnoses   Final diagnoses:  Febrile illness  Community acquired pneumonia of left lung, unspecified part of lung    ED Discharge Orders         Ordered    levofloxacin (LEVAQUIN) 750 MG tablet  Daily     01/04/18 2219           Daleen Bo, MD 01/04/18 2224

## 2018-01-09 LAB — CULTURE, BLOOD (ROUTINE X 2)
Culture: NO GROWTH
Culture: NO GROWTH
Special Requests: ADEQUATE
Special Requests: ADEQUATE

## 2018-05-06 MED ORDER — AMLODIPINE BESYLATE 5 MG PO TABS
2.50 | ORAL_TABLET | ORAL | Status: DC
Start: 2018-05-07 — End: 2018-05-06

## 2018-05-06 MED ORDER — TAMSULOSIN HCL 0.4 MG PO CAPS
0.40 | ORAL_CAPSULE | ORAL | Status: DC
Start: 2018-05-07 — End: 2018-05-06

## 2018-05-06 MED ORDER — METRONIDAZOLE 250 MG PO TABS
500.00 | ORAL_TABLET | ORAL | Status: DC
Start: 2018-05-06 — End: 2018-05-06

## 2018-05-06 MED ORDER — LIDOCAINE HCL 1 % IJ SOLN
.50 | INTRAMUSCULAR | Status: DC
Start: ? — End: 2018-05-06

## 2018-05-06 MED ORDER — FOLIC ACID 1 MG PO TABS
1.00 | ORAL_TABLET | ORAL | Status: DC
Start: 2018-05-07 — End: 2018-05-06

## 2018-05-06 MED ORDER — ASPIRIN EC 81 MG PO TBEC
81.00 | DELAYED_RELEASE_TABLET | ORAL | Status: DC
Start: 2018-05-07 — End: 2018-05-06

## 2018-05-06 MED ORDER — FEXOFENADINE HCL 180 MG PO TABS
180.00 | ORAL_TABLET | ORAL | Status: DC
Start: 2018-05-07 — End: 2018-05-06

## 2018-05-06 MED ORDER — GENERIC EXTERNAL MEDICATION
1.00 | Status: DC
Start: 2018-05-07 — End: 2018-05-06

## 2018-05-06 MED ORDER — GENERIC EXTERNAL MEDICATION
3.00 | Status: DC
Start: ? — End: 2018-05-06

## 2018-05-06 MED ORDER — ONDANSETRON HCL 4 MG/2ML IJ SOLN
4.00 | INTRAMUSCULAR | Status: DC
Start: ? — End: 2018-05-06

## 2018-05-06 MED ORDER — HEPARIN SODIUM (PORCINE) 5000 UNIT/ML IJ SOLN
5000.00 | INTRAMUSCULAR | Status: DC
Start: 2018-05-06 — End: 2018-05-06

## 2018-05-06 MED ORDER — FLUTICASONE PROPIONATE 50 MCG/ACT NA SUSP
2.00 | NASAL | Status: DC
Start: 2018-05-07 — End: 2018-05-06

## 2018-05-06 MED ORDER — ACETAMINOPHEN 325 MG PO TABS
650.00 | ORAL_TABLET | ORAL | Status: DC
Start: ? — End: 2018-05-06

## 2018-05-06 MED ORDER — PRAVASTATIN SODIUM 20 MG PO TABS
20.00 | ORAL_TABLET | ORAL | Status: DC
Start: 2018-05-06 — End: 2018-05-06

## 2018-05-06 MED ORDER — PREDNISONE 5 MG PO TABS
2.50 | ORAL_TABLET | ORAL | Status: DC
Start: 2018-05-07 — End: 2018-05-06

## 2018-05-06 MED ORDER — PANTOPRAZOLE SODIUM 40 MG PO TBEC
40.00 | DELAYED_RELEASE_TABLET | ORAL | Status: DC
Start: 2018-05-07 — End: 2018-05-06

## 2018-05-06 MED ORDER — ALBUTEROL SULFATE HFA 108 (90 BASE) MCG/ACT IN AERS
2.00 | INHALATION_SPRAY | RESPIRATORY_TRACT | Status: DC
Start: ? — End: 2018-05-06

## 2018-05-06 MED ORDER — CIPROFLOXACIN HCL 500 MG PO TABS
500.00 | ORAL_TABLET | ORAL | Status: DC
Start: 2018-05-06 — End: 2018-05-06

## 2018-05-06 MED ORDER — OXYCODONE HCL 5 MG PO TABS
5.00 | ORAL_TABLET | ORAL | Status: DC
Start: ? — End: 2018-05-06

## 2018-06-12 ENCOUNTER — Encounter (INDEPENDENT_AMBULATORY_CARE_PROVIDER_SITE_OTHER): Payer: Self-pay | Admitting: Orthopaedic Surgery

## 2018-06-12 ENCOUNTER — Ambulatory Visit (INDEPENDENT_AMBULATORY_CARE_PROVIDER_SITE_OTHER): Payer: Medicare Other | Admitting: Orthopaedic Surgery

## 2018-06-12 ENCOUNTER — Ambulatory Visit (INDEPENDENT_AMBULATORY_CARE_PROVIDER_SITE_OTHER): Payer: Medicare Other

## 2018-06-12 VITALS — BP 116/78 | HR 81 | Ht 71.0 in | Wt 170.0 lb

## 2018-06-12 DIAGNOSIS — G8929 Other chronic pain: Secondary | ICD-10-CM

## 2018-06-12 DIAGNOSIS — M25561 Pain in right knee: Secondary | ICD-10-CM

## 2018-06-12 NOTE — Progress Notes (Signed)
Office Visit Note   Patient: Patrick Humphrey           Date of Birth: 27-Jan-1966           MRN: [id number] Visit Date: 06/12/2018              Requested by: Monico Blitz, MD [address] [place], [place] [address] PCP: Monico Blitz, MD   Assessment & Plan: Visit Diagnoses:  1. Chronic pain of right knee     Plan: Patient not having recurrent locking.  We reviewed the x-rays.  He may have a degenerative meniscal tear.  Develops locking persistent problems or increased problems walking he can call let us know.  X-rays were negative for fracture.  Follow-Up Instructions: Return if symptoms worsen or fail to improve.   Orders:  Orders Placed This Encounter  Procedures  . XR KNEE 3 VIEW RIGHT   No orders of the defined types were placed in this encounter.     Procedures: No procedures performed   Clinical Data: No additional findings.   Subjective: Chief Complaint  Patient presents with  . Right Knee - Pain    HPI 53 year old male 6 months ago got his foot caught on a parking brick and started to fall.  He had his backpack on his back and is pulling his foot out before falling and all his weight on his right leg.  He twisted his knee and had some sharp pain in his right knee.  He is not sure if it swelled or not.  He had some pain medication post gallbladder surgery and is also had heart transplant for nonischemic cardiomyopathy.  He use the oxycodone for pain which seemed to help.  He is continued to have some knee symptoms since that time.  He is not having true locking.  He states at times his pain is worse.  Review of Systems 14 point review of system positive for nonischemic cardiomyopathy.  He previously had a ICD does not have it currently it was removed.  History of elevated liver enzymes cardiogenic shock.  Atrial fib.  Otherwise negative is a pertains HPI.   Objective: Vital Signs: BP 116/78   Pulse 81   Ht 5\' 11"  (1.803 m)   Wt 170 lb (77.1 kg)   BMI 23.71  kg/m   Physical Exam Constitutional:      Appearance: He is well-developed.  HENT:     Head: Normocephalic and atraumatic.  Eyes:     Pupils: Pupils are equal, round, and reactive to light.  Neck:     Thyroid: No thyromegaly.     Trachea: No tracheal deviation.  Cardiovascular:     Rate and Rhythm: Normal rate.  Pulmonary:     Effort: Pulmonary effort is normal.     Breath sounds: No wheezing.  Abdominal:     General: Bowel sounds are normal.     Palpations: Abdomen is soft.  Skin:    General: Skin is warm and dry.     Capillary Refill: Capillary refill takes less than 2 seconds.  Neurological:     Mental Status: He is alert and oriented to person, place, and time.  Psychiatric:        Behavior: Behavior normal.        Thought Content: Thought content normal.        Judgment: Judgment normal.     Ortho Exam negative logroll of the hips negative straight leg raising no sciatic notch tenderness.  Patient  has some medial joint line pain along the right knee collateral ligaments crucial ligament exam is normal.  Trace knee effusion.  He is able ambulate without knee limp.  No lateral joint line tenderness.  Specialty Comments:  No specialty comments available.  Imaging: No results found.   PMFS History: Patient Active Problem List   Diagnosis Date Noted  . Chronic systolic CHF (congestive heart failure) ([place]) 06/14/2015  . Paroxysmal atrial fibrillation ([place]) 06/14/2015  . LVAD (left ventricular assist device) present ([place]) 05/10/2015  . Palliative care encounter   . Cardiogenic shock ([place])   . Acute on chronic systolic CHF (congestive heart failure) ([place]) 04/29/2015  . Elevated liver enzymes 03/17/2015  . ICD - Medtronic Maximo II VR 2009 10/21/2013  . Nonischemic cardiomyopathy ([place]) 02/20/2013   Past Medical History:  Diagnosis Date  . AICD (automatic cardioverter/defibrillator) present   . CHF (congestive heart failure) ([place])   . Hx of echocardiogram  11/09/1910   Showed an EF of 25% with no significant valve disease.  . ICD (implantable cardioverter-defibrillator), single, in situ 08/26/2007  . Nonischemic cardiomyopathy ([place])   . Presence of permanent cardiac pacemaker     Family History  Problem Relation Age of Onset  . Hypertension Mother   . Hypertension Father   . Cancer - Prostate Maternal Grandfather     Past Surgical History:  Procedure Laterality Date  . APPENDECTOMY    . CARDIAC CATHETERIZATION  2002   Normal coronary arteries  . CARDIAC CATHETERIZATION N/A 05/04/2015   Procedure: Right/Left Heart Cath and Coronary Angiography;  Surgeon: Larey Dresser, MD;  Location: [place] CV LAB;  Service: Cardiovascular;  Laterality: N/A;  . CARDIAC CATHETERIZATION N/A 05/08/2015   Procedure: Right Heart Cath;  Surgeon: Jolaine Artist, MD;  Location: [place] CV LAB;  Service: Cardiovascular;  Laterality: N/A;  . CARDIAC CATHETERIZATION N/A 05/08/2015   Procedure: IABP Insertion;  Surgeon: Jolaine Artist, MD;  Location: [place] CV LAB;  Service: Cardiovascular;  Laterality: N/A;  . CARDIOVERSION N/A 05/18/2015   Procedure: CARDIOVERSION WITH TEE;  Surgeon: Larey Dresser, MD;  Location: [place];  Service: Cardiovascular;  Laterality: N/A;  . HEART TRANSPLANT    . INSERTION OF IMPLANTABLE LEFT VENTRICULAR ASSIST DEVICE N/A 05/10/2015   Procedure: INSERTION OF IMPLANTABLE LEFT VENTRICULAR ASSIST DEVICE;  Surgeon: Ivin Poot, MD;  Location: [place];  Service: Open Heart Surgery;  Laterality: N/A;  CIRC ARREST  NITRIC OXIDE  . PACEMAKER INSERTION  08/26/2007   place by Dr. Alroy Dust at [facility]  . [place] 05/10/2015   Procedure: TRANSESOPHAGEAL ECHOCARDIOGRAM (TEE);  Surgeon: Ivin Poot, MD;  Location: [place];  Service: Open Heart Surgery;  Laterality: N/A;   Social History   Occupational History  . Not on file  Tobacco Use  . Smoking status: Former Smoker    Packs/day: 1.00     Last attempt to quit: 02/20/1998    Years since quitting: 20.3  . Smokeless tobacco: Never Used  Substance and Sexual Activity  . Alcohol use: Yes    Alcohol/week: 0.0 standard drinks    Comment: has quit drinking x 1 1/2 months. He sporadically drinks  . Drug use: No  . Sexual activity: Yes

## 2018-08-26 ENCOUNTER — Other Ambulatory Visit (HOSPITAL_COMMUNITY)
Admission: RE | Admit: 2018-08-26 | Discharge: 2018-08-26 | Disposition: A | Discharge status: Home / Self Care | Payer: Medicare Other | Source: Ambulatory Visit | Attending: Internal Medicine | Admitting: Internal Medicine

## 2018-08-26 ENCOUNTER — Other Ambulatory Visit: Payer: Self-pay

## 2018-08-26 DIAGNOSIS — Z941 Heart transplant status: Secondary | ICD-10-CM | POA: Insufficient documentation

## 2019-01-27 ENCOUNTER — Other Ambulatory Visit: Payer: Self-pay

## 2019-01-27 DIAGNOSIS — Z20822 Contact with and (suspected) exposure to covid-19: Secondary | ICD-10-CM

## 2019-01-28 LAB — NOVEL CORONAVIRUS, NAA: SARS-CoV-2, NAA: DETECTED — AB

## 2019-05-18 DIAGNOSIS — L309 Dermatitis, unspecified: Secondary | ICD-10-CM | POA: Diagnosis not present

## 2019-05-18 DIAGNOSIS — D485 Neoplasm of uncertain behavior of skin: Secondary | ICD-10-CM | POA: Diagnosis not present

## 2019-05-18 DIAGNOSIS — D230 Other benign neoplasm of skin of lip: Secondary | ICD-10-CM | POA: Diagnosis not present

## 2019-05-18 DIAGNOSIS — L570 Actinic keratosis: Secondary | ICD-10-CM | POA: Diagnosis not present

## 2019-08-18 DIAGNOSIS — D2262 Melanocytic nevi of left upper limb, including shoulder: Secondary | ICD-10-CM | POA: Diagnosis not present

## 2019-08-18 DIAGNOSIS — D225 Melanocytic nevi of trunk: Secondary | ICD-10-CM | POA: Diagnosis not present

## 2019-08-18 DIAGNOSIS — L309 Dermatitis, unspecified: Secondary | ICD-10-CM | POA: Diagnosis not present

## 2019-08-18 DIAGNOSIS — B078 Other viral warts: Secondary | ICD-10-CM | POA: Diagnosis not present

## 2019-08-18 DIAGNOSIS — L570 Actinic keratosis: Secondary | ICD-10-CM | POA: Diagnosis not present

## 2019-11-13 DIAGNOSIS — Z48298 Encounter for aftercare following other organ transplant: Secondary | ICD-10-CM | POA: Diagnosis not present

## 2019-11-13 DIAGNOSIS — N183 Chronic kidney disease, stage 3 unspecified: Secondary | ICD-10-CM | POA: Diagnosis not present

## 2019-11-13 DIAGNOSIS — I10 Essential (primary) hypertension: Secondary | ICD-10-CM | POA: Diagnosis not present

## 2019-11-13 DIAGNOSIS — D849 Immunodeficiency, unspecified: Secondary | ICD-10-CM | POA: Diagnosis not present

## 2019-11-13 DIAGNOSIS — I129 Hypertensive chronic kidney disease with stage 1 through stage 4 chronic kidney disease, or unspecified chronic kidney disease: Secondary | ICD-10-CM | POA: Diagnosis not present

## 2019-11-13 DIAGNOSIS — Z9225 Personal history of immunosupression therapy: Secondary | ICD-10-CM | POA: Diagnosis not present

## 2019-11-13 DIAGNOSIS — Z941 Heart transplant status: Secondary | ICD-10-CM | POA: Diagnosis not present

## 2019-11-13 DIAGNOSIS — Z298 Encounter for other specified prophylactic measures: Secondary | ICD-10-CM | POA: Diagnosis not present

## 2019-11-13 DIAGNOSIS — Z79899 Other long term (current) drug therapy: Secondary | ICD-10-CM | POA: Diagnosis not present

## 2019-11-13 DIAGNOSIS — D84821 Immunodeficiency due to drugs: Secondary | ICD-10-CM | POA: Diagnosis not present

## 2019-11-13 DIAGNOSIS — Z4821 Encounter for aftercare following heart transplant: Secondary | ICD-10-CM | POA: Diagnosis not present

## 2020-02-04 DIAGNOSIS — M9902 Segmental and somatic dysfunction of thoracic region: Secondary | ICD-10-CM | POA: Diagnosis not present

## 2020-02-04 DIAGNOSIS — M9903 Segmental and somatic dysfunction of lumbar region: Secondary | ICD-10-CM | POA: Diagnosis not present

## 2020-02-04 DIAGNOSIS — M47812 Spondylosis without myelopathy or radiculopathy, cervical region: Secondary | ICD-10-CM | POA: Diagnosis not present

## 2020-02-04 DIAGNOSIS — S233XXA Sprain of ligaments of thoracic spine, initial encounter: Secondary | ICD-10-CM | POA: Diagnosis not present

## 2020-02-04 DIAGNOSIS — M9901 Segmental and somatic dysfunction of cervical region: Secondary | ICD-10-CM | POA: Diagnosis not present

## 2020-02-09 DIAGNOSIS — Z941 Heart transplant status: Secondary | ICD-10-CM | POA: Diagnosis not present

## 2020-02-09 DIAGNOSIS — Z95811 Presence of heart assist device: Secondary | ICD-10-CM | POA: Diagnosis not present

## 2020-02-09 DIAGNOSIS — I471 Supraventricular tachycardia: Secondary | ICD-10-CM | POA: Diagnosis not present

## 2020-02-09 DIAGNOSIS — M9903 Segmental and somatic dysfunction of lumbar region: Secondary | ICD-10-CM | POA: Diagnosis not present

## 2020-02-09 DIAGNOSIS — Z87891 Personal history of nicotine dependence: Secondary | ICD-10-CM | POA: Diagnosis not present

## 2020-02-09 DIAGNOSIS — S233XXA Sprain of ligaments of thoracic spine, initial encounter: Secondary | ICD-10-CM | POA: Diagnosis not present

## 2020-02-09 DIAGNOSIS — M47812 Spondylosis without myelopathy or radiculopathy, cervical region: Secondary | ICD-10-CM | POA: Diagnosis not present

## 2020-02-09 DIAGNOSIS — M9901 Segmental and somatic dysfunction of cervical region: Secondary | ICD-10-CM | POA: Diagnosis not present

## 2020-02-09 DIAGNOSIS — Z299 Encounter for prophylactic measures, unspecified: Secondary | ICD-10-CM | POA: Diagnosis not present

## 2020-02-09 DIAGNOSIS — J329 Chronic sinusitis, unspecified: Secondary | ICD-10-CM | POA: Diagnosis not present

## 2020-02-09 DIAGNOSIS — M9902 Segmental and somatic dysfunction of thoracic region: Secondary | ICD-10-CM | POA: Diagnosis not present

## 2020-02-16 DIAGNOSIS — S233XXA Sprain of ligaments of thoracic spine, initial encounter: Secondary | ICD-10-CM | POA: Diagnosis not present

## 2020-02-16 DIAGNOSIS — M47812 Spondylosis without myelopathy or radiculopathy, cervical region: Secondary | ICD-10-CM | POA: Diagnosis not present

## 2020-02-16 DIAGNOSIS — M9901 Segmental and somatic dysfunction of cervical region: Secondary | ICD-10-CM | POA: Diagnosis not present

## 2020-02-16 DIAGNOSIS — M9903 Segmental and somatic dysfunction of lumbar region: Secondary | ICD-10-CM | POA: Diagnosis not present

## 2020-02-16 DIAGNOSIS — M9902 Segmental and somatic dysfunction of thoracic region: Secondary | ICD-10-CM | POA: Diagnosis not present

## 2020-02-22 DIAGNOSIS — Z299 Encounter for prophylactic measures, unspecified: Secondary | ICD-10-CM | POA: Diagnosis not present

## 2020-02-22 DIAGNOSIS — Z87891 Personal history of nicotine dependence: Secondary | ICD-10-CM | POA: Diagnosis not present

## 2020-02-22 DIAGNOSIS — Z95811 Presence of heart assist device: Secondary | ICD-10-CM | POA: Diagnosis not present

## 2020-02-22 DIAGNOSIS — Z941 Heart transplant status: Secondary | ICD-10-CM | POA: Diagnosis not present

## 2020-02-22 DIAGNOSIS — I471 Supraventricular tachycardia: Secondary | ICD-10-CM | POA: Diagnosis not present

## 2020-02-22 DIAGNOSIS — J329 Chronic sinusitis, unspecified: Secondary | ICD-10-CM | POA: Diagnosis not present

## 2020-03-15 DIAGNOSIS — M9903 Segmental and somatic dysfunction of lumbar region: Secondary | ICD-10-CM | POA: Diagnosis not present

## 2020-03-15 DIAGNOSIS — M47812 Spondylosis without myelopathy or radiculopathy, cervical region: Secondary | ICD-10-CM | POA: Diagnosis not present

## 2020-03-15 DIAGNOSIS — S233XXA Sprain of ligaments of thoracic spine, initial encounter: Secondary | ICD-10-CM | POA: Diagnosis not present

## 2020-03-15 DIAGNOSIS — M9902 Segmental and somatic dysfunction of thoracic region: Secondary | ICD-10-CM | POA: Diagnosis not present

## 2020-03-15 DIAGNOSIS — M9901 Segmental and somatic dysfunction of cervical region: Secondary | ICD-10-CM | POA: Diagnosis not present

## 2020-05-17 DIAGNOSIS — B078 Other viral warts: Secondary | ICD-10-CM | POA: Diagnosis not present

## 2020-05-17 DIAGNOSIS — R238 Other skin changes: Secondary | ICD-10-CM | POA: Diagnosis not present

## 2020-05-17 DIAGNOSIS — L298 Other pruritus: Secondary | ICD-10-CM | POA: Diagnosis not present

## 2020-05-17 DIAGNOSIS — R208 Other disturbances of skin sensation: Secondary | ICD-10-CM | POA: Diagnosis not present

## 2020-05-17 DIAGNOSIS — Z789 Other specified health status: Secondary | ICD-10-CM | POA: Diagnosis not present

## 2020-05-17 DIAGNOSIS — R58 Hemorrhage, not elsewhere classified: Secondary | ICD-10-CM | POA: Diagnosis not present

## 2020-05-17 DIAGNOSIS — L538 Other specified erythematous conditions: Secondary | ICD-10-CM | POA: Diagnosis not present

## 2020-05-17 DIAGNOSIS — L578 Other skin changes due to chronic exposure to nonionizing radiation: Secondary | ICD-10-CM | POA: Diagnosis not present

## 2020-05-17 DIAGNOSIS — L570 Actinic keratosis: Secondary | ICD-10-CM | POA: Diagnosis not present

## 2020-05-17 DIAGNOSIS — L814 Other melanin hyperpigmentation: Secondary | ICD-10-CM | POA: Diagnosis not present

## 2020-06-30 DIAGNOSIS — Z299 Encounter for prophylactic measures, unspecified: Secondary | ICD-10-CM | POA: Diagnosis not present

## 2020-06-30 DIAGNOSIS — Z2821 Immunization not carried out because of patient refusal: Secondary | ICD-10-CM | POA: Diagnosis not present

## 2020-06-30 DIAGNOSIS — Z95811 Presence of heart assist device: Secondary | ICD-10-CM | POA: Diagnosis not present

## 2020-06-30 DIAGNOSIS — Z87891 Personal history of nicotine dependence: Secondary | ICD-10-CM | POA: Diagnosis not present

## 2020-06-30 DIAGNOSIS — I10 Essential (primary) hypertension: Secondary | ICD-10-CM | POA: Diagnosis not present

## 2020-06-30 DIAGNOSIS — J329 Chronic sinusitis, unspecified: Secondary | ICD-10-CM | POA: Diagnosis not present

## 2020-06-30 DIAGNOSIS — I471 Supraventricular tachycardia: Secondary | ICD-10-CM | POA: Diagnosis not present

## 2020-07-05 DIAGNOSIS — Z95810 Presence of automatic (implantable) cardiac defibrillator: Secondary | ICD-10-CM | POA: Diagnosis not present

## 2020-07-05 DIAGNOSIS — Z48298 Encounter for aftercare following other organ transplant: Secondary | ICD-10-CM | POA: Diagnosis not present

## 2020-07-05 DIAGNOSIS — Z949 Transplanted organ and tissue status, unspecified: Secondary | ICD-10-CM | POA: Diagnosis not present

## 2020-07-05 DIAGNOSIS — Z79899 Other long term (current) drug therapy: Secondary | ICD-10-CM | POA: Diagnosis not present

## 2020-07-05 DIAGNOSIS — T8620 Unspecified complication of heart transplant: Secondary | ICD-10-CM | POA: Diagnosis not present

## 2020-07-05 DIAGNOSIS — D84821 Immunodeficiency due to drugs: Secondary | ICD-10-CM | POA: Diagnosis not present

## 2020-07-05 DIAGNOSIS — N183 Chronic kidney disease, stage 3 unspecified: Secondary | ICD-10-CM | POA: Diagnosis not present

## 2020-07-05 DIAGNOSIS — Z4821 Encounter for aftercare following heart transplant: Secondary | ICD-10-CM | POA: Diagnosis not present

## 2020-07-05 DIAGNOSIS — Z941 Heart transplant status: Secondary | ICD-10-CM | POA: Diagnosis not present

## 2020-07-05 DIAGNOSIS — I158 Other secondary hypertension: Secondary | ICD-10-CM | POA: Diagnosis not present

## 2020-07-05 DIAGNOSIS — I10 Essential (primary) hypertension: Secondary | ICD-10-CM | POA: Diagnosis not present

## 2020-07-05 DIAGNOSIS — I1310 Hypertensive heart and chronic kidney disease without heart failure, with stage 1 through stage 4 chronic kidney disease, or unspecified chronic kidney disease: Secondary | ICD-10-CM | POA: Diagnosis not present

## 2020-07-05 DIAGNOSIS — Z87891 Personal history of nicotine dependence: Secondary | ICD-10-CM | POA: Diagnosis not present

## 2020-07-05 DIAGNOSIS — E785 Hyperlipidemia, unspecified: Secondary | ICD-10-CM | POA: Diagnosis not present

## 2020-07-05 DIAGNOSIS — Z298 Encounter for other specified prophylactic measures: Secondary | ICD-10-CM | POA: Diagnosis not present

## 2020-07-13 ENCOUNTER — Other Ambulatory Visit (HOSPITAL_COMMUNITY): Payer: Self-pay | Admitting: Cardiology

## 2020-07-13 ENCOUNTER — Other Ambulatory Visit: Payer: Self-pay | Admitting: Cardiology

## 2020-07-13 DIAGNOSIS — R1031 Right lower quadrant pain: Secondary | ICD-10-CM

## 2020-07-15 ENCOUNTER — Other Ambulatory Visit: Payer: Self-pay

## 2020-07-15 ENCOUNTER — Ambulatory Visit (HOSPITAL_COMMUNITY)
Admission: RE | Admit: 2020-07-15 | Discharge: 2020-07-15 | Disposition: A | Discharge status: Home / Self Care | Payer: Medicare Other | Source: Ambulatory Visit | Attending: Cardiology | Admitting: Cardiology

## 2020-07-15 DIAGNOSIS — R1031 Right lower quadrant pain: Secondary | ICD-10-CM | POA: Diagnosis not present

## 2020-07-15 DIAGNOSIS — Z941 Heart transplant status: Secondary | ICD-10-CM | POA: Diagnosis not present

## 2020-07-15 NOTE — Progress Notes (Signed)
Right groin arterial US completed     Please see CV Proc for preliminary results.   Vonzell Schlatter, RVT

## 2020-10-11 DIAGNOSIS — M659 Synovitis and tenosynovitis, unspecified: Secondary | ICD-10-CM | POA: Diagnosis not present

## 2020-10-21 DIAGNOSIS — Z299 Encounter for prophylactic measures, unspecified: Secondary | ICD-10-CM | POA: Diagnosis not present

## 2020-10-21 DIAGNOSIS — J329 Chronic sinusitis, unspecified: Secondary | ICD-10-CM | POA: Diagnosis not present

## 2020-10-21 DIAGNOSIS — Z95811 Presence of heart assist device: Secondary | ICD-10-CM | POA: Diagnosis not present

## 2020-10-21 DIAGNOSIS — Z941 Heart transplant status: Secondary | ICD-10-CM | POA: Diagnosis not present

## 2021-01-11 DIAGNOSIS — M47812 Spondylosis without myelopathy or radiculopathy, cervical region: Secondary | ICD-10-CM | POA: Diagnosis not present

## 2021-01-11 DIAGNOSIS — S335XXA Sprain of ligaments of lumbar spine, initial encounter: Secondary | ICD-10-CM | POA: Diagnosis not present

## 2021-01-11 DIAGNOSIS — M9902 Segmental and somatic dysfunction of thoracic region: Secondary | ICD-10-CM | POA: Diagnosis not present

## 2021-01-11 DIAGNOSIS — S233XXA Sprain of ligaments of thoracic spine, initial encounter: Secondary | ICD-10-CM | POA: Diagnosis not present

## 2021-01-11 DIAGNOSIS — M9903 Segmental and somatic dysfunction of lumbar region: Secondary | ICD-10-CM | POA: Diagnosis not present

## 2021-01-11 DIAGNOSIS — M9901 Segmental and somatic dysfunction of cervical region: Secondary | ICD-10-CM | POA: Diagnosis not present

## 2021-01-12 DIAGNOSIS — S233XXA Sprain of ligaments of thoracic spine, initial encounter: Secondary | ICD-10-CM | POA: Diagnosis not present

## 2021-01-12 DIAGNOSIS — M9902 Segmental and somatic dysfunction of thoracic region: Secondary | ICD-10-CM | POA: Diagnosis not present

## 2021-01-12 DIAGNOSIS — M9901 Segmental and somatic dysfunction of cervical region: Secondary | ICD-10-CM | POA: Diagnosis not present

## 2021-01-12 DIAGNOSIS — S335XXA Sprain of ligaments of lumbar spine, initial encounter: Secondary | ICD-10-CM | POA: Diagnosis not present

## 2021-01-12 DIAGNOSIS — M47812 Spondylosis without myelopathy or radiculopathy, cervical region: Secondary | ICD-10-CM | POA: Diagnosis not present

## 2021-01-12 DIAGNOSIS — M9903 Segmental and somatic dysfunction of lumbar region: Secondary | ICD-10-CM | POA: Diagnosis not present

## 2021-01-20 DIAGNOSIS — Z7982 Long term (current) use of aspirin: Secondary | ICD-10-CM | POA: Diagnosis not present

## 2021-01-20 DIAGNOSIS — Z941 Heart transplant status: Secondary | ICD-10-CM | POA: Diagnosis not present

## 2021-01-20 DIAGNOSIS — Z23 Encounter for immunization: Secondary | ICD-10-CM | POA: Diagnosis not present

## 2021-01-20 DIAGNOSIS — D84821 Immunodeficiency due to drugs: Secondary | ICD-10-CM | POA: Diagnosis not present

## 2021-01-20 DIAGNOSIS — N183 Chronic kidney disease, stage 3 unspecified: Secondary | ICD-10-CM | POA: Diagnosis not present

## 2021-01-20 DIAGNOSIS — Z48298 Encounter for aftercare following other organ transplant: Secondary | ICD-10-CM | POA: Diagnosis not present

## 2021-01-20 DIAGNOSIS — I1310 Hypertensive heart and chronic kidney disease without heart failure, with stage 1 through stage 4 chronic kidney disease, or unspecified chronic kidney disease: Secondary | ICD-10-CM | POA: Diagnosis not present

## 2021-01-20 DIAGNOSIS — Z7952 Long term (current) use of systemic steroids: Secondary | ICD-10-CM | POA: Diagnosis not present

## 2021-01-20 DIAGNOSIS — Z4821 Encounter for aftercare following heart transplant: Secondary | ICD-10-CM | POA: Diagnosis not present

## 2021-01-20 DIAGNOSIS — Z79621 Long term (current) use of calcineurin inhibitor: Secondary | ICD-10-CM | POA: Diagnosis not present

## 2021-01-20 DIAGNOSIS — Z79899 Other long term (current) drug therapy: Secondary | ICD-10-CM | POA: Diagnosis not present

## 2021-02-01 ENCOUNTER — Other Ambulatory Visit: Payer: Self-pay | Admitting: Cardiology

## 2021-02-01 ENCOUNTER — Other Ambulatory Visit (HOSPITAL_COMMUNITY): Payer: Self-pay | Admitting: Cardiology

## 2021-02-01 DIAGNOSIS — R748 Abnormal levels of other serum enzymes: Secondary | ICD-10-CM

## 2021-02-02 ENCOUNTER — Telehealth: Payer: Self-pay | Admitting: Radiology

## 2021-02-13 ENCOUNTER — Ambulatory Visit (HOSPITAL_COMMUNITY): Admission: RE | Admit: 2021-02-13 | Payer: Medicare Other | Source: Ambulatory Visit

## 2021-02-13 ENCOUNTER — Encounter (HOSPITAL_COMMUNITY): Payer: Self-pay

## 2021-02-24 ENCOUNTER — Ambulatory Visit (HOSPITAL_COMMUNITY)
Admission: RE | Admit: 2021-02-24 | Discharge: 2021-02-24 | Disposition: A | Discharge status: Home / Self Care | Payer: Medicare Other | Source: Ambulatory Visit | Attending: Cardiology | Admitting: Cardiology

## 2021-02-24 ENCOUNTER — Other Ambulatory Visit: Payer: Self-pay

## 2021-02-24 DIAGNOSIS — R748 Abnormal levels of other serum enzymes: Secondary | ICD-10-CM | POA: Diagnosis not present

## 2021-02-24 DIAGNOSIS — R945 Abnormal results of liver function studies: Secondary | ICD-10-CM | POA: Diagnosis not present

## 2021-02-24 DIAGNOSIS — K760 Fatty (change of) liver, not elsewhere classified: Secondary | ICD-10-CM | POA: Diagnosis not present

## 2021-05-03 DIAGNOSIS — Z85828 Personal history of other malignant neoplasm of skin: Secondary | ICD-10-CM | POA: Diagnosis not present

## 2021-05-03 DIAGNOSIS — L570 Actinic keratosis: Secondary | ICD-10-CM | POA: Diagnosis not present

## 2021-05-15 DIAGNOSIS — M9902 Segmental and somatic dysfunction of thoracic region: Secondary | ICD-10-CM | POA: Diagnosis not present

## 2021-05-15 DIAGNOSIS — M9901 Segmental and somatic dysfunction of cervical region: Secondary | ICD-10-CM | POA: Diagnosis not present

## 2021-05-15 DIAGNOSIS — S233XXA Sprain of ligaments of thoracic spine, initial encounter: Secondary | ICD-10-CM | POA: Diagnosis not present

## 2021-05-15 DIAGNOSIS — M47812 Spondylosis without myelopathy or radiculopathy, cervical region: Secondary | ICD-10-CM | POA: Diagnosis not present

## 2021-05-15 DIAGNOSIS — S335XXA Sprain of ligaments of lumbar spine, initial encounter: Secondary | ICD-10-CM | POA: Diagnosis not present

## 2021-05-15 DIAGNOSIS — M9903 Segmental and somatic dysfunction of lumbar region: Secondary | ICD-10-CM | POA: Diagnosis not present

## 2021-05-23 DIAGNOSIS — I10 Essential (primary) hypertension: Secondary | ICD-10-CM | POA: Diagnosis not present

## 2021-05-23 DIAGNOSIS — Z79899 Other long term (current) drug therapy: Secondary | ICD-10-CM | POA: Diagnosis not present

## 2021-05-23 DIAGNOSIS — Z299 Encounter for prophylactic measures, unspecified: Secondary | ICD-10-CM | POA: Diagnosis not present

## 2021-05-23 DIAGNOSIS — Z0000 Encounter for general adult medical examination without abnormal findings: Secondary | ICD-10-CM | POA: Diagnosis not present

## 2021-05-23 DIAGNOSIS — M109 Gout, unspecified: Secondary | ICD-10-CM | POA: Diagnosis not present

## 2021-05-23 DIAGNOSIS — Z941 Heart transplant status: Secondary | ICD-10-CM | POA: Diagnosis not present

## 2021-05-23 DIAGNOSIS — Z7189 Other specified counseling: Secondary | ICD-10-CM | POA: Diagnosis not present

## 2021-08-25 DIAGNOSIS — Z4821 Encounter for aftercare following heart transplant: Secondary | ICD-10-CM | POA: Diagnosis not present

## 2021-08-25 DIAGNOSIS — Z95810 Presence of automatic (implantable) cardiac defibrillator: Secondary | ICD-10-CM | POA: Diagnosis not present

## 2021-08-25 DIAGNOSIS — D84821 Immunodeficiency due to drugs: Secondary | ICD-10-CM | POA: Diagnosis not present

## 2021-08-25 DIAGNOSIS — Z941 Heart transplant status: Secondary | ICD-10-CM | POA: Diagnosis not present

## 2021-08-25 DIAGNOSIS — I130 Hypertensive heart and chronic kidney disease with heart failure and stage 1 through stage 4 chronic kidney disease, or unspecified chronic kidney disease: Secondary | ICD-10-CM | POA: Diagnosis not present

## 2021-08-25 DIAGNOSIS — Z949 Transplanted organ and tissue status, unspecified: Secondary | ICD-10-CM | POA: Diagnosis not present

## 2021-08-25 DIAGNOSIS — I158 Other secondary hypertension: Secondary | ICD-10-CM | POA: Diagnosis not present

## 2021-08-25 DIAGNOSIS — Z79899 Other long term (current) drug therapy: Secondary | ICD-10-CM | POA: Diagnosis not present

## 2021-08-25 DIAGNOSIS — N183 Chronic kidney disease, stage 3 unspecified: Secondary | ICD-10-CM | POA: Diagnosis not present

## 2021-08-25 DIAGNOSIS — R251 Tremor, unspecified: Secondary | ICD-10-CM | POA: Diagnosis not present

## 2021-08-31 ENCOUNTER — Other Ambulatory Visit (HOSPITAL_COMMUNITY): Payer: Self-pay | Admitting: Cardiology

## 2021-08-31 DIAGNOSIS — Z941 Heart transplant status: Secondary | ICD-10-CM

## 2021-08-31 DIAGNOSIS — Z48298 Encounter for aftercare following other organ transplant: Secondary | ICD-10-CM

## 2021-09-27 ENCOUNTER — Telehealth: Payer: Self-pay

## 2021-09-27 NOTE — Telephone Encounter (Signed)
Detailed instructions left on the patient's answering machine. Asked to call back with any questions. S.Markees Carns EMTP 

## 2021-10-03 ENCOUNTER — Ambulatory Visit (HOSPITAL_COMMUNITY): Payer: Medicare Other

## 2021-10-03 ENCOUNTER — Ambulatory Visit (HOSPITAL_COMMUNITY): Payer: Medicare Other | Attending: Internal Medicine

## 2021-10-03 DIAGNOSIS — Z941 Heart transplant status: Secondary | ICD-10-CM | POA: Diagnosis not present

## 2021-10-03 DIAGNOSIS — Z48298 Encounter for aftercare following other organ transplant: Secondary | ICD-10-CM

## 2021-10-03 LAB — ECHOCARDIOGRAM STRESS TEST
Area-P 1/2: 4.31 cm2
S' Lateral: 2.7 cm

## 2021-10-03 MED ORDER — PERFLUTREN LIPID MICROSPHERE
1.0000 mL | INTRAVENOUS | Status: AC | PRN
  Administered 2021-10-03 (×3): 1 mL via INTRAVENOUS

## 2021-10-05 DIAGNOSIS — I471 Supraventricular tachycardia: Secondary | ICD-10-CM | POA: Diagnosis not present

## 2021-10-05 DIAGNOSIS — Z0000 Encounter for general adult medical examination without abnormal findings: Secondary | ICD-10-CM | POA: Diagnosis not present

## 2021-10-05 DIAGNOSIS — E7800 Pure hypercholesterolemia, unspecified: Secondary | ICD-10-CM | POA: Diagnosis not present

## 2021-10-05 DIAGNOSIS — Z6823 Body mass index (BMI) 23.0-23.9, adult: Secondary | ICD-10-CM | POA: Diagnosis not present

## 2021-10-05 DIAGNOSIS — Z79899 Other long term (current) drug therapy: Secondary | ICD-10-CM | POA: Diagnosis not present

## 2021-10-05 DIAGNOSIS — Z87891 Personal history of nicotine dependence: Secondary | ICD-10-CM | POA: Diagnosis not present

## 2021-10-05 DIAGNOSIS — Z299 Encounter for prophylactic measures, unspecified: Secondary | ICD-10-CM | POA: Diagnosis not present

## 2021-10-12 DIAGNOSIS — L570 Actinic keratosis: Secondary | ICD-10-CM | POA: Diagnosis not present

## 2021-10-13 DIAGNOSIS — I471 Supraventricular tachycardia: Secondary | ICD-10-CM | POA: Diagnosis not present

## 2022-01-09 DIAGNOSIS — A09 Infectious gastroenteritis and colitis, unspecified: Secondary | ICD-10-CM | POA: Diagnosis not present

## 2022-01-09 DIAGNOSIS — Z789 Other specified health status: Secondary | ICD-10-CM | POA: Diagnosis not present

## 2022-01-09 DIAGNOSIS — D84821 Immunodeficiency due to drugs: Secondary | ICD-10-CM | POA: Diagnosis not present

## 2022-01-09 DIAGNOSIS — R109 Unspecified abdominal pain: Secondary | ICD-10-CM | POA: Diagnosis not present

## 2022-01-09 DIAGNOSIS — I10 Essential (primary) hypertension: Secondary | ICD-10-CM | POA: Diagnosis not present

## 2022-01-09 DIAGNOSIS — Z299 Encounter for prophylactic measures, unspecified: Secondary | ICD-10-CM | POA: Diagnosis not present

## 2022-01-17 DIAGNOSIS — K760 Fatty (change of) liver, not elsewhere classified: Secondary | ICD-10-CM | POA: Diagnosis not present

## 2022-01-17 DIAGNOSIS — R935 Abnormal findings on diagnostic imaging of other abdominal regions, including retroperitoneum: Secondary | ICD-10-CM | POA: Diagnosis not present

## 2022-01-17 DIAGNOSIS — R634 Abnormal weight loss: Secondary | ICD-10-CM | POA: Diagnosis not present

## 2022-01-17 DIAGNOSIS — I700 Atherosclerosis of aorta: Secondary | ICD-10-CM | POA: Diagnosis not present

## 2022-01-17 DIAGNOSIS — R109 Unspecified abdominal pain: Secondary | ICD-10-CM | POA: Diagnosis not present

## 2022-01-17 DIAGNOSIS — R110 Nausea: Secondary | ICD-10-CM | POA: Diagnosis not present

## 2022-01-17 DIAGNOSIS — R188 Other ascites: Secondary | ICD-10-CM | POA: Diagnosis not present

## 2022-01-29 ENCOUNTER — Encounter (INDEPENDENT_AMBULATORY_CARE_PROVIDER_SITE_OTHER): Payer: Self-pay | Admitting: *Deleted

## 2022-02-01 ENCOUNTER — Ambulatory Visit (INDEPENDENT_AMBULATORY_CARE_PROVIDER_SITE_OTHER): Payer: Medicare Other | Admitting: Gastroenterology

## 2022-02-01 ENCOUNTER — Telehealth (INDEPENDENT_AMBULATORY_CARE_PROVIDER_SITE_OTHER): Payer: Self-pay

## 2022-02-01 ENCOUNTER — Encounter (INDEPENDENT_AMBULATORY_CARE_PROVIDER_SITE_OTHER): Payer: Self-pay | Admitting: Gastroenterology

## 2022-02-01 ENCOUNTER — Encounter (INDEPENDENT_AMBULATORY_CARE_PROVIDER_SITE_OTHER): Payer: Self-pay

## 2022-02-01 ENCOUNTER — Other Ambulatory Visit (INDEPENDENT_AMBULATORY_CARE_PROVIDER_SITE_OTHER): Payer: Self-pay

## 2022-02-01 VITALS — BP 119/77 | HR 106 | Temp 97.8°F | Ht 71.0 in | Wt 160.1 lb

## 2022-02-01 DIAGNOSIS — R933 Abnormal findings on diagnostic imaging of other parts of digestive tract: Secondary | ICD-10-CM | POA: Diagnosis not present

## 2022-02-01 DIAGNOSIS — R112 Nausea with vomiting, unspecified: Secondary | ICD-10-CM

## 2022-02-01 DIAGNOSIS — R194 Change in bowel habit: Secondary | ICD-10-CM

## 2022-02-01 DIAGNOSIS — R634 Abnormal weight loss: Secondary | ICD-10-CM

## 2022-02-01 DIAGNOSIS — R932 Abnormal findings on diagnostic imaging of liver and biliary tract: Secondary | ICD-10-CM

## 2022-02-01 DIAGNOSIS — R7989 Other specified abnormal findings of blood chemistry: Secondary | ICD-10-CM

## 2022-02-01 MED ORDER — PEG 3350-KCL-NA BICARB-NACL 420 G PO SOLR: 4000.0000 mL | ORAL | 0 refills | Status: DC

## 2022-02-01 MED ORDER — DICYCLOMINE HCL 10 MG PO CAPS: 10.0000 mg | ORAL_CAPSULE | Freq: Two times a day (BID) | ORAL | 1 refills | Status: DC | PRN

## 2022-02-01 NOTE — H&P (View-Only) (Signed)
Referring Provider: Monico Blitz, MD Primary Care Physician:  Monico Blitz, MD Primary GI Physician: new   Chief Complaint  Patient presents with   Weight Loss    New patient. Referred for thickening of the distal gastric antrum pylorus. Having some abdominal pain, bloating, nausea, no appetite, Lost about 40 lbs in past 3 months, diarrhea, vomiting, noticed some blood in stool yesterday.   HPI:   Patrick Humphrey is a 56 y.o. male with past medical history of AICD, CHF, heart transplant, nonischemic cardiomyopathy.   Patient presenting today as a new patient for abnormal imaging of UGI tract.   Recent CT A/P wo contrast on 01/17/22 Significant thickening of  the distal gastric antrum/pylorus, could represent an artifact from  under distention but neoplasm not excluded. Remaining stomach and bowel loops unremarkable.   Most recent labs with BUN 24, AST 97, ALT 60, Alk Phos 108, T bili 0.7, plt count 143k, hgb 14.3.  -[place] RUQ in nov 2022 with hepatic steatosis without focal liver lesions, prior cholecystectomy -most recent CT Diffuse hepatic steatosis. Question mildly nodular hepatic contours, cannot exclude cirrhosis.   Patient reports that about 3 months ago started losing weight, having nausea, vomiting and fecal urgency. He was on course of xifaxan by PCP. He endorses lower abdominal pain, has this the majority of the time, especially when moving around. He reports he has lost 25 lbs in the past few  months (per chart review 183 lbs in oct 2022, 160 lbs today). Reports he is hungry, sometimes he can eat but most of the time he feels that he cannot tolerate a full meal. States that stools range from more solid to looser but he has fecal urgency with clear discharge and flatulence. He noticed some BRB yesterday morning for the first time just on the toilet tissue. He is having to run to the restroom often for feeling that he has to have a BM but a lot of times does not pass stools, actually  feels like he is constipated. Having some mid to lower abdominal pain. He feels dehydrated as he is not able to tolerate a lot of liquids. Has intermittent nausea and vomiting. Denies dysphagia or odynophagia. Does feel bloated. Has occasional epigastric pain that sometimes radiates to his mid back. Maintained on protonix 81m BID for sometime which has not helped.   Did have heart transplant in 2017, had LVAD prior to that.   NSAID use: does not use  Social hx: occasional etoh, no tobacco  Fam hx: no crc, liver disease, pancreatic cancer  Last Colonoscopy:never  Last Endoscopy:2016- UNCR, gastritis, mild narrowing of distal esophagus, dilated  Recommendations:    Past Medical History:  Diagnosis Date   AICD (automatic cardioverter/defibrillator) present    CHF (congestive heart failure) (HCC)    Hx of echocardiogram 11/09/1910   Showed an EF of 25% with no significant valve disease.   ICD (implantable cardioverter-defibrillator), single, in situ 08/26/2007   [place]ischemic [place] ([facility] [place]    Presence of permanent cardiac pacemaker     Past Surgical History:  Procedure Laterality Date   APPENDECTOMY     CARDIAC CATHETERIZATION  2002   Normal coronary arteries   CARDIAC CATHETERIZATION N/A 05/04/2015   Procedure: Right/Left Heart Cath and Coronary Angiography;  Surgeon: DLarey Dresser MD;  [facility];  Service: Cardiovascular;  Laterality: N/A;   CARDIAC CATHETERIZATION N/A 05/08/2015   Procedure: Right Heart Cath;  Surgeon: DJolaine Artist MD;  Location:  [facility];  Service: Cardiovascular;  Laterality: N/A;   CARDIAC CATHETERIZATION N/A 05/08/2015   Procedure: IABP Insertion;  Surgeon: Jolaine Artist, MD;  Location: [facility];  Service: Cardiovascular;  Laterality: N/A;   CARDIOVERSION N/A 05/18/2015   Procedure: CARDIOVERSION WITH TEE;  Surgeon: Larey Dresser, MD;  Location: [facility];  Service: Cardiovascular;  Laterality: N/A;   HEART  TRANSPLANT     INSERTION OF IMPLANTABLE LEFT VENTRICULAR ASSIST DEVICE N/A 05/10/2015   Procedure: INSERTION OF IMPLANTABLE LEFT VENTRICULAR ASSIST DEVICE;  Surgeon: Ivin Poot, MD;  Location: [facility];  Service: Open Heart Surgery;  Laterality: N/A;  CIRC ARREST  NITRIC OXIDE   PACEMAKER INSERTION  08/26/2007   place by Dr. Alroy Dust at [facility]   TEE [place] N/A 05/10/2015   Procedure: TRANSESOPHAGEAL ECHOCARDIOGRAM (TEE);  Surgeon: Ivin Poot, MD;  Location: [facility];  Service: Open Heart Surgery;  Laterality: N/A;    Current Outpatient Medications  Medication Sig Dispense Refill   ALPRAZolam (XANAX) 0.5 MG tablet Take 0.5 mg by mouth. Daily prn     amLODipine (NORVASC) 2.5 MG tablet Take 2.5 mg by mouth daily.      aspirin EC 81 MG tablet Take 81 mg by mouth daily.     cetirizine (ZYRTEC) 10 MG tablet Take 10 mg by mouth daily.     fluticasone (FLONASE) 50 MCG/ACT nasal spray Place 2 sprays into both nostrils daily.      mycophenolate (MYFORTIC) 360 MG TBEC EC tablet Take 360 mg by mouth 2 (two) times daily.     pantoprazole (PROTONIX) 40 MG tablet Take 40 mg by mouth 2 (two) times daily.      pravastatin (PRAVACHOL) 20 MG tablet Take 20 mg by mouth daily at 6 PM.     tacrolimus (PROGRAF) 1 MG capsule Take by mouth. 3 qam and 2 at night     tamsulosin (FLOMAX) 0.4 MG CAPS capsule Take 0.4 mg by mouth daily.     No current facility-administered medications for this visit.    Allergies as of 02/01/2022 - Review Complete 02/01/2022  Allergen Reaction Noted   Penicillins  03/05/2012   Sulfa antibiotics  03/05/2012    Family History  Problem Relation Age of Onset   Hypertension Mother    Hypertension Father    Cancer - Prostate Maternal Grandfather     Social History   Socioeconomic History   Marital status: Married    Spouse name: Not on file   Number of children: Not on file   Years of education: Not on file   Highest education level: Not on file   Occupational History   Not on file  Tobacco Use   Smoking status: Former    Packs/day: 1.00    Types: Cigarettes    Quit date: 02/20/1998    Years since quitting: 23.9   Smokeless tobacco: Never  Substance and Sexual Activity   Alcohol use: Yes    Alcohol/week: 0.0 standard drinks of alcohol    Comment: has quit drinking x 1 1/2 months. He sporadically drinks   Drug use: No   Sexual activity: Yes  Other Topics Concern   Not on file  Social History Narrative   Not on file   Social Determinants of Health   Financial Resource Strain: Not on file  Food Insecurity: Not on file  Transportation Needs: Not on file  Physical Activity: Not on file  Stress: Not on file  Social Connections: Not on file   Review of systems General: negative for malaise, night sweats, fever, chills, +weight loss Neck: Negative for lumps, goiter, pain and significant neck swelling Resp: Negative for cough, wheezing, dyspnea at rest CV: Negative for chest pain, leg swelling, palpitations, orthopnea GI: denies melena, hematochezia,  diarrhea, dysphagia, odyonophagia, + early satiety +unintentional weight loss +constipation/ +Nausea +vomiting  MSK: Negative for joint pain or swelling, back pain, and muscle pain. Derm: Negative for itching or rash Psych: Denies depression, anxiety, memory loss, confusion. No homicidal or suicidal ideation.  Heme: Negative for prolonged bleeding, bruising easily, and swollen nodes. Endocrine: Negative for cold or heat intolerance, polyuria, polydipsia and goiter. Neuro: negative for tremor, gait imbalance, syncope and seizures. The remainder of the review of systems is noncontributory.  Physical Exam: Ht _0  (1.803 m)   Wt 160 lb 1.6 oz (72.6 kg)   BMI 22.33 kg/m  General:   Alert and oriented. No distress noted. Pleasant and cooperative.  Head:  Normocephalic and atraumatic. Eyes:  Conjuctiva clear without scleral icterus. Mouth:  Oral mucosa pink and moist. Good  dentition. No lesions. Heart: Normal rate and rhythm, s1 and s2 heart sounds present.  Lungs: Clear lung sounds in all lobes. Respirations equal and unlabored. Abdomen:  +BS, soft but distended, non tender. No rebound or guarding. No HSM or masses noted. Derm: No palmar erythema or jaundice Msk:  Symmetrical without gross deformities. Normal posture. Extremities:  Without edema. Neurologic:  Alert and  oriented x4 Psych:  Alert and cooperative. Normal mood and affect.  Invalid input(s): "6 MONTHS"   ASSESSMENT: SRIKAR CHIANG is a 56 y.o. male presenting today as a new patient with abnormal CT imaging of the UGI tract and weight loss.  Patient with nausea, vomiting weight loss for the past few months.  Also with reports of early satiety. CT with PCP concerning for thickening of  distal gastric antrum/pylorus. He noticed some BRB yesterday morning for the first time just on the toilet tissue. He is having to run to the restroom often for feeling that he has to have a BM but a lot of times does not pass stools, actually feels like he is constipated. Does feel bloated. Has occasional epigastric pain that sometimes radiates to his mid back, no odynophagia or dysphagia. Maintained on protonix 44m BID for sometime. Given nausea, vomiting, weight loss and findings on CT, as above, would recommend proceeding with EGD for further evaluation as malignancy ultimately cannot be ruled out. He has also never had a colonoscopy and given recent weight loss and bowel habit changes with new onset of rectal bleeding, would also recommend proceeding with colonoscopy. Advised patient to start miralax for feelings of constipation. Will also send bentyl 164mto be taken up to BID PRN for abdominal discomfort. He should continue with boost/ensure protein shakes for supplemental nutrition given his decreased intake and weight loss. Will also check CBC and CMP as he reports feeling dehydrated as he cannot tolerate a large  intake of fluids due to his symptoms. Indications, risks and benefits of procedure discussed in detail with patient. Patient verbalized understanding and is in agreement to proceed with EGD/Colonoscopy at this time.   Notably with history of hepatic steatosis on [place]aging in 2022, recent CT with concern for possible cirrhosis. Patient reports that LFTs have been somewhat elevated since his heart transplant in 2018. Will proceed with US[place]aiver elastography, Iron studies, AIH serologies, acute hep panel to rule out  cirrhosis/underlying liver disease.    PLAN:  [place] elastography  2. Liver serologies  3. EGD and colonoscopy, ASA III ENDO 3  4. Start miralax  5. Protein shakes BID-TID 6. CBC, CMP 7. Bentyl 70m BID PRN  All questions were answered, patient verbalized understanding and is in agreement with plan as outlined above.   Follow Up: 3 months  Patrick Humphrey L. CAlver Sorrow MSN, APRN, AGNP-C Adult-Gerontology Nurse Practitioner [facility]  I have reviewed the note and agree with the APP's assessment as described in this progress note  DMaylon Peppers MD Gastroenterology and Hepatology [facility]

## 2022-02-01 NOTE — Telephone Encounter (Signed)
Miamor Ayler Ann Gayla Benn, CMA  ?

## 2022-02-01 NOTE — Patient Instructions (Addendum)
We will check basic labs to look at blood counts and electrolytes I will order liver specific labs and special imaging US of the liver for further evaluation of elevated liver function and abnormalities noted on CT We will get you scheduled for EGD and Colonoscopy for further evaluation In the meantime for feeling constipated, you can Start taking Miralax 1 capful every day for one week. If bowel movements do not improve, increase to 1 capful every 12 hours. If after two weeks there is no improvement, increase to 1 capful every 8 hours I have sent bentyl '10mg'$  to take up to twice daily for abdominal pain/discomfort Try to continue protein shakes as tolerated and stay well hydrated alternating low sugar gatorade and water  Follow up 3 months

## 2022-02-01 NOTE — Progress Notes (Addendum)
Referring Provider: Monico Blitz, MD Primary Care Physician:  Monico Blitz, MD Primary GI Physician: new   Chief Complaint  Patient presents with   Weight Loss    New patient. Referred for thickening of the distal gastric antrum pylorus. Having some abdominal pain, bloating, nausea, no appetite, Lost about 40 lbs in past 3 months, diarrhea, vomiting, noticed some blood in stool yesterday.   HPI:   Patrick Humphrey is a 56 y.o. male with past medical history of AICD, CHF, heart transplant, nonischemic cardiomyopathy.   Patient presenting today as a new patient for abnormal imaging of UGI tract.   Recent CT A/P wo contrast on 01/17/22 Significant thickening of  the distal gastric antrum/pylorus, could represent an artifact from  under distention but neoplasm not excluded. Remaining stomach and bowel loops unremarkable.   Most recent labs with BUN 24, AST 97, ALT 60, Alk Phos 108, T bili 0.7, plt count 143k, hgb 14.3.  -[place] RUQ in nov 2022 with hepatic steatosis without focal liver lesions, prior cholecystectomy -most recent CT Diffuse hepatic steatosis. Question mildly nodular hepatic contours, cannot exclude cirrhosis.   Patient reports that about 3 months ago started losing weight, having nausea, vomiting and fecal urgency. He was on course of xifaxan by PCP. He endorses lower abdominal pain, has this the majority of the time, especially when moving around. He reports he has lost 25 lbs in the past few  months (per chart review 183 lbs in oct 2022, 160 lbs today). Reports he is hungry, sometimes he can eat but most of the time he feels that he cannot tolerate a full meal. States that stools range from more solid to looser but he has fecal urgency with clear discharge and flatulence. He noticed some BRB yesterday morning for the first time just on the toilet tissue. He is having to run to the restroom often for feeling that he has to have a BM but a lot of times does not pass stools, actually  feels like he is constipated. Having some mid to lower abdominal pain. He feels dehydrated as he is not able to tolerate a lot of liquids. Has intermittent nausea and vomiting. Denies dysphagia or odynophagia. Does feel bloated. Has occasional epigastric pain that sometimes radiates to his mid back. Maintained on protonix 81m BID for sometime which has not helped.   Did have heart transplant in 2017, had LVAD prior to that.   NSAID use: does not use  Social hx: occasional etoh, no tobacco  Fam hx: no crc, liver disease, pancreatic cancer  Last Colonoscopy:never  Last Endoscopy:2016- UNCR, gastritis, mild narrowing of distal esophagus, dilated  Recommendations:    Past Medical History:  Diagnosis Date   AICD (automatic cardioverter/defibrillator) present    CHF (congestive heart failure) (HCC)    Hx of echocardiogram 11/09/1910   Showed an EF of 25% with no significant valve disease.   ICD (implantable cardioverter-defibrillator), single, in situ 08/26/2007   Nonischemic cardiomyopathy ([facility] [place]    Presence of permanent cardiac pacemaker     Past Surgical History:  Procedure Laterality Date   APPENDECTOMY     CARDIAC CATHETERIZATION  2002   Normal coronary arteries   CARDIAC CATHETERIZATION N/A 05/04/2015   Procedure: Right/Left Heart Cath and Coronary Angiography;  Surgeon: DLarey Dresser MD;  [facility];  Service: Cardiovascular;  Laterality: N/A;   CARDIAC CATHETERIZATION N/A 05/08/2015   Procedure: Right Heart Cath;  Surgeon: DJolaine Artist MD;  Location:  [facility];  Service: Cardiovascular;  Laterality: N/A;   CARDIAC CATHETERIZATION N/A 05/08/2015   Procedure: IABP Insertion;  Surgeon: Jolaine Artist, MD;  Location: [facility];  Service: Cardiovascular;  Laterality: N/A;   CARDIOVERSION N/A 05/18/2015   Procedure: CARDIOVERSION WITH TEE;  Surgeon: Larey Dresser, MD;  Location: [facility];  Service: Cardiovascular;  Laterality: N/A;   HEART  TRANSPLANT     INSERTION OF IMPLANTABLE LEFT VENTRICULAR ASSIST DEVICE N/A 05/10/2015   Procedure: INSERTION OF IMPLANTABLE LEFT VENTRICULAR ASSIST DEVICE;  Surgeon: Ivin Poot, MD;  Location: [facility];  Service: Open Heart Surgery;  Laterality: N/A;  CIRC ARREST  NITRIC OXIDE   PACEMAKER INSERTION  08/26/2007   place by Dr. Alroy Dust at [facility]   TEE [place] N/A 05/10/2015   Procedure: TRANSESOPHAGEAL ECHOCARDIOGRAM (TEE);  Surgeon: Ivin Poot, MD;  Location: [facility];  Service: Open Heart Surgery;  Laterality: N/A;    Current Outpatient Medications  Medication Sig Dispense Refill   ALPRAZolam (XANAX) 0.5 MG tablet Take 0.5 mg by mouth. Daily prn     amLODipine (NORVASC) 2.5 MG tablet Take 2.5 mg by mouth daily.      aspirin EC 81 MG tablet Take 81 mg by mouth daily.     cetirizine (ZYRTEC) 10 MG tablet Take 10 mg by mouth daily.     fluticasone (FLONASE) 50 MCG/ACT nasal spray Place 2 sprays into both nostrils daily.      mycophenolate (MYFORTIC) 360 MG TBEC EC tablet Take 360 mg by mouth 2 (two) times daily.     pantoprazole (PROTONIX) 40 MG tablet Take 40 mg by mouth 2 (two) times daily.      pravastatin (PRAVACHOL) 20 MG tablet Take 20 mg by mouth daily at 6 PM.     tacrolimus (PROGRAF) 1 MG capsule Take by mouth. 3 qam and 2 at night     tamsulosin (FLOMAX) 0.4 MG CAPS capsule Take 0.4 mg by mouth daily.     No current facility-administered medications for this visit.    Allergies as of 02/01/2022 - Review Complete 02/01/2022  Allergen Reaction Noted   Penicillins  03/05/2012   Sulfa antibiotics  03/05/2012    Family History  Problem Relation Age of Onset   Hypertension Mother    Hypertension Father    Cancer - Prostate Maternal Grandfather     Social History   Socioeconomic History   Marital status: Married    Spouse name: Not on file   Number of children: Not on file   Years of education: Not on file   Highest education level: Not on file   Occupational History   Not on file  Tobacco Use   Smoking status: Former    Packs/day: 1.00    Types: Cigarettes    Quit date: 02/20/1998    Years since quitting: 23.9   Smokeless tobacco: Never  Substance and Sexual Activity   Alcohol use: Yes    Alcohol/week: 0.0 standard drinks of alcohol    Comment: has quit drinking x 1 1/2 months. He sporadically drinks   Drug use: No   Sexual activity: Yes  Other Topics Concern   Not on file  Social History Narrative   Not on file   Social Determinants of Health   Financial Resource Strain: Not on file  Food Insecurity: Not on file  Transportation Needs: Not on file  Physical Activity: Not on file  Stress: Not on file  Social Connections: Not on file   Review of systems General: negative for malaise, night sweats, fever, chills, +weight loss Neck: Negative for lumps, goiter, pain and significant neck swelling Resp: Negative for cough, wheezing, dyspnea at rest CV: Negative for chest pain, leg swelling, palpitations, orthopnea GI: denies melena, hematochezia,  diarrhea, dysphagia, odyonophagia, + early satiety +unintentional weight loss +constipation/ +Nausea +vomiting  MSK: Negative for joint pain or swelling, back pain, and muscle pain. Derm: Negative for itching or rash Psych: Denies depression, anxiety, memory loss, confusion. No homicidal or suicidal ideation.  Heme: Negative for prolonged bleeding, bruising easily, and swollen nodes. Endocrine: Negative for cold or heat intolerance, polyuria, polydipsia and goiter. Neuro: negative for tremor, gait imbalance, syncope and seizures. The remainder of the review of systems is noncontributory.  Physical Exam: Ht _0  (1.803 m)   Wt 160 lb 1.6 oz (72.6 kg)   BMI 22.33 kg/m  General:   Alert and oriented. No distress noted. Pleasant and cooperative.  Head:  Normocephalic and atraumatic. Eyes:  Conjuctiva clear without scleral icterus. Mouth:  Oral mucosa pink and moist. Good  dentition. No lesions. Heart: Normal rate and rhythm, s1 and s2 heart sounds present.  Lungs: Clear lung sounds in all lobes. Respirations equal and unlabored. Abdomen:  +BS, soft but distended, non tender. No rebound or guarding. No HSM or masses noted. Derm: No palmar erythema or jaundice Msk:  Symmetrical without gross deformities. Normal posture. Extremities:  Without edema. Neurologic:  Alert and  oriented x4 Psych:  Alert and cooperative. Normal mood and affect.  Invalid input(s): "6 MONTHS"   ASSESSMENT: Patrick Humphrey is a 56 y.o. male presenting today as a new patient with abnormal CT imaging of the UGI tract and weight loss.  Patient with nausea, vomiting weight loss for the past few months.  Also with reports of early satiety. CT with PCP concerning for thickening of  distal gastric antrum/pylorus. He noticed some BRB yesterday morning for the first time just on the toilet tissue. He is having to run to the restroom often for feeling that he has to have a BM but a lot of times does not pass stools, actually feels like he is constipated. Does feel bloated. Has occasional epigastric pain that sometimes radiates to his mid back, no odynophagia or dysphagia. Maintained on protonix 35m BID for sometime. Given nausea, vomiting, weight loss and findings on CT, as above, would recommend proceeding with EGD for further evaluation as malignancy ultimately cannot be ruled out. He has also never had a colonoscopy and given recent weight loss and bowel habit changes with new onset of rectal bleeding, would also recommend proceeding with colonoscopy. Advised patient to start miralax for feelings of constipation. Will also send bentyl 190mto be taken up to BID PRN for abdominal discomfort. He should continue with boost/ensure protein shakes for supplemental nutrition given his decreased intake and weight loss. Will also check CBC and CMP as he reports feeling dehydrated as he cannot tolerate a large  intake of fluids due to his symptoms. Indications, risks and benefits of procedure discussed in detail with patient. Patient verbalized understanding and is in agreement to proceed with EGD/Colonoscopy at this time.   Notably with history of hepatic steatosis on US[place]amaging in 2022, recent CT with concern for possible cirrhosis. Patient reports that LFTs have been somewhat elevated since his heart transplant in 2018. Will proceed with US[place]aiver elastography, Iron studies, AIH serologies, acute hep panel to rule out  cirrhosis/underlying liver disease.    PLAN:  [place] elastography  2. Liver serologies  3. EGD and colonoscopy, ASA III ENDO 3  4. Start miralax  5. Protein shakes BID-TID 6. CBC, CMP 7. Bentyl 70m BID PRN  All questions were answered, patient verbalized understanding and is in agreement with plan as outlined above.   Follow Up: 3 months  Daishia Fetterly L. CAlver Sorrow MSN, APRN, AGNP-C Adult-Gerontology Nurse Practitioner [facility]  I have reviewed the note and agree with the APP's assessment as described in this progress note  DMaylon Peppers MD Gastroenterology and Hepatology [facility]

## 2022-02-02 ENCOUNTER — Other Ambulatory Visit (INDEPENDENT_AMBULATORY_CARE_PROVIDER_SITE_OTHER): Payer: Self-pay

## 2022-02-02 ENCOUNTER — Ambulatory Visit (HOSPITAL_COMMUNITY)
Admission: RE | Admit: 2022-02-02 | Discharge: 2022-02-02 | Disposition: A | Discharge status: Home / Self Care | Payer: Medicare Other | Source: Ambulatory Visit | Attending: Gastroenterology | Admitting: Gastroenterology

## 2022-02-02 DIAGNOSIS — R932 Abnormal findings on diagnostic imaging of liver and biliary tract: Secondary | ICD-10-CM

## 2022-02-02 DIAGNOSIS — R945 Abnormal results of liver function studies: Secondary | ICD-10-CM | POA: Diagnosis not present

## 2022-02-02 DIAGNOSIS — R7989 Other specified abnormal findings of blood chemistry: Secondary | ICD-10-CM | POA: Diagnosis not present

## 2022-02-05 ENCOUNTER — Other Ambulatory Visit (INDEPENDENT_AMBULATORY_CARE_PROVIDER_SITE_OTHER): Payer: Self-pay | Admitting: Gastroenterology

## 2022-02-05 DIAGNOSIS — R112 Nausea with vomiting, unspecified: Secondary | ICD-10-CM | POA: Insufficient documentation

## 2022-02-05 DIAGNOSIS — R7989 Other specified abnormal findings of blood chemistry: Secondary | ICD-10-CM

## 2022-02-05 DIAGNOSIS — R933 Abnormal findings on diagnostic imaging of other parts of digestive tract: Secondary | ICD-10-CM | POA: Insufficient documentation

## 2022-02-05 DIAGNOSIS — R932 Abnormal findings on diagnostic imaging of liver and biliary tract: Secondary | ICD-10-CM

## 2022-02-06 ENCOUNTER — Encounter (INDEPENDENT_AMBULATORY_CARE_PROVIDER_SITE_OTHER): Payer: Self-pay

## 2022-02-06 DIAGNOSIS — R748 Abnormal levels of other serum enzymes: Secondary | ICD-10-CM | POA: Diagnosis not present

## 2022-02-06 DIAGNOSIS — N184 Chronic kidney disease, stage 4 (severe): Secondary | ICD-10-CM | POA: Diagnosis not present

## 2022-02-06 DIAGNOSIS — I5022 Chronic systolic (congestive) heart failure: Secondary | ICD-10-CM | POA: Diagnosis not present

## 2022-02-06 DIAGNOSIS — R7989 Other specified abnormal findings of blood chemistry: Secondary | ICD-10-CM | POA: Diagnosis not present

## 2022-02-07 DIAGNOSIS — Z941 Heart transplant status: Secondary | ICD-10-CM | POA: Diagnosis not present

## 2022-02-10 LAB — COMPREHENSIVE METABOLIC PANEL
ALT: 51 IU/L — ABNORMAL HIGH (ref 0–44)
AST: 118 IU/L — ABNORMAL HIGH (ref 0–40)
Albumin/Globulin Ratio: 1.4 (ref 1.2–2.2)
Albumin: 4 g/dL (ref 3.8–4.9)
Alkaline Phosphatase: 177 IU/L — ABNORMAL HIGH (ref 44–121)
BUN/Creatinine Ratio: 9 (ref 9–20)
BUN: 29 mg/dL — ABNORMAL HIGH (ref 6–24)
Bilirubin Total: 2.3 mg/dL — ABNORMAL HIGH (ref 0.0–1.2)
CO2: 24 mmol/L (ref 20–29)
Calcium: 9.3 mg/dL (ref 8.7–10.2)
Chloride: 95 mmol/L — ABNORMAL LOW (ref 96–106)
Creatinine, Ser: 3.09 mg/dL — ABNORMAL HIGH (ref 0.76–1.27)
Globulin, Total: 2.8 g/dL (ref 1.5–4.5)
Glucose: 129 mg/dL — ABNORMAL HIGH (ref 70–99)
Potassium: 4.7 mmol/L (ref 3.5–5.2)
Sodium: 138 mmol/L (ref 134–144)
Total Protein: 6.8 g/dL (ref 6.0–8.5)
eGFR: 23 mL/min/{1.73_m2} — ABNORMAL LOW (ref >59)

## 2022-02-10 LAB — CBC
Hematocrit: 34.8 % — ABNORMAL LOW (ref 37.5–51.0)
Hemoglobin: 12.9 g/dL — ABNORMAL LOW (ref 13.0–17.7)
MCH: 34.3 pg — ABNORMAL HIGH (ref 26.6–33.0)
MCHC: 37.1 g/dL — ABNORMAL HIGH (ref 31.5–35.7)
MCV: 93 fL (ref 79–97)
Platelets: 112 10*3/uL — ABNORMAL LOW (ref 150–450)
RBC: 3.76 x10E6/uL — ABNORMAL LOW (ref 4.14–5.80)
RDW: 12.6 % (ref 11.6–15.4)
WBC: 4 10*3/uL (ref 3.4–10.8)

## 2022-02-10 LAB — HCV INTERPRETATION

## 2022-02-10 LAB — ACUTE VIRAL HEPATITIS (HAV, HBV, HCV)
HCV Ab: NONREACTIVE
Hep A IgM: NEGATIVE
Hep B C IgM: NEGATIVE
Hepatitis B Surface Ag: NEGATIVE

## 2022-02-10 LAB — ANA: Anti Nuclear Antibody (ANA): NEGATIVE

## 2022-02-10 LAB — IGG, IGA, IGM
IgA/Immunoglobulin A, Serum: 690 mg/dL — ABNORMAL HIGH (ref 90–386)
IgG (Immunoglobin G), Serum: 1135 mg/dL (ref 603–1613)
IgM (Immunoglobulin M), Srm: 127 mg/dL (ref 20–172)

## 2022-02-10 LAB — IRON,TIBC AND FERRITIN PANEL
Ferritin: 1000 ng/mL — ABNORMAL HIGH (ref 30–400)
Iron Saturation: 38 % (ref 15–55)
Iron: 86 ug/dL (ref 38–169)
Total Iron Binding Capacity: 226 ug/dL — ABNORMAL LOW (ref 250–450)
UIBC: 140 ug/dL (ref 111–343)

## 2022-02-10 LAB — ANTI-SMOOTH MUSCLE ANTIBODY, IGG: Smooth Muscle Ab: 7 Units (ref 0–19)

## 2022-02-10 LAB — ALPHA-1-ANTITRYPSIN: A-1 Antitrypsin: 128 mg/dL (ref 101–187)

## 2022-02-10 LAB — CERULOPLASMIN: Ceruloplasmin: 24.7 mg/dL (ref 16.0–31.0)

## 2022-02-10 LAB — MITOCHONDRIAL ANTIBODIES: Mitochondrial Ab: <20 Units (ref 0.0–20.0)

## 2022-02-13 DIAGNOSIS — Z85828 Personal history of other malignant neoplasm of skin: Secondary | ICD-10-CM | POA: Diagnosis not present

## 2022-02-15 NOTE — Pre-Procedure Instructions (Signed)
Dr Charna Elizabeth viewed chart due to extensive heart history. He looked at recent echo and is okay with those results without further cardiac clearance as long as we get ICD form back. I cannot tell if [facility] monitors his ICD or [facility]. I faxed ICD forms to Dr Agapito Games ([place])  at [phone] and to the [place] clinic at [phone].

## 2022-02-16 ENCOUNTER — Other Ambulatory Visit: Payer: Self-pay

## 2022-02-16 ENCOUNTER — Encounter (HOSPITAL_COMMUNITY): Payer: Self-pay

## 2022-02-16 ENCOUNTER — Emergency Department (HOSPITAL_COMMUNITY)
Admission: EM | Admit: 2022-02-16 | Discharge: 2022-02-16 | Disposition: A | Discharge status: Home / Self Care | Payer: Medicare Other | Attending: Emergency Medicine | Admitting: Emergency Medicine

## 2022-02-16 ENCOUNTER — Emergency Department (HOSPITAL_COMMUNITY): Payer: Medicare Other

## 2022-02-16 ENCOUNTER — Encounter (HOSPITAL_COMMUNITY)
Admission: RE | Admit: 2022-02-16 | Discharge: 2022-02-16 | Disposition: A | Discharge status: Home / Self Care | Payer: Medicare Other | Source: Ambulatory Visit | Attending: Internal Medicine | Admitting: Internal Medicine

## 2022-02-16 VITALS — BP 142/87 | HR 88 | Temp 97.8°F | Resp 18 | Ht 71.0 in | Wt 160.1 lb

## 2022-02-16 DIAGNOSIS — I480 Paroxysmal atrial fibrillation: Secondary | ICD-10-CM | POA: Insufficient documentation

## 2022-02-16 DIAGNOSIS — R188 Other ascites: Secondary | ICD-10-CM | POA: Diagnosis not present

## 2022-02-16 DIAGNOSIS — Z7982 Long term (current) use of aspirin: Secondary | ICD-10-CM | POA: Diagnosis not present

## 2022-02-16 DIAGNOSIS — R112 Nausea with vomiting, unspecified: Secondary | ICD-10-CM

## 2022-02-16 DIAGNOSIS — Z79899 Other long term (current) drug therapy: Secondary | ICD-10-CM | POA: Diagnosis not present

## 2022-02-16 DIAGNOSIS — Z01810 Encounter for preprocedural cardiovascular examination: Secondary | ICD-10-CM | POA: Insufficient documentation

## 2022-02-16 DIAGNOSIS — R1084 Generalized abdominal pain: Secondary | ICD-10-CM | POA: Diagnosis present

## 2022-02-16 DIAGNOSIS — R194 Change in bowel habit: Secondary | ICD-10-CM

## 2022-02-16 DIAGNOSIS — Z95810 Presence of automatic (implantable) cardiac defibrillator: Secondary | ICD-10-CM | POA: Insufficient documentation

## 2022-02-16 DIAGNOSIS — R634 Abnormal weight loss: Secondary | ICD-10-CM

## 2022-02-16 DIAGNOSIS — R1033 Periumbilical pain: Secondary | ICD-10-CM | POA: Diagnosis not present

## 2022-02-16 DIAGNOSIS — R932 Abnormal findings on diagnostic imaging of liver and biliary tract: Secondary | ICD-10-CM

## 2022-02-16 HISTORY — DX: Effusion, right ankle: M25.471

## 2022-02-16 HISTORY — DX: Abdominal distension (gaseous): R14.0

## 2022-02-16 HISTORY — DX: Effusion, left ankle: M25.472

## 2022-02-16 LAB — BODY FLUID CELL COUNT WITH DIFFERENTIAL
Eos, Fluid: 0 %
Lymphs, Fluid: 49 %
Monocyte-Macrophage-Serous Fluid: 47 % — ABNORMAL LOW (ref 50–90)
Neutrophil Count, Fluid: 4 % (ref 0–25)
Total Nucleated Cell Count, Fluid: 211 cu mm (ref 0–1000)

## 2022-02-16 LAB — GRAM STAIN

## 2022-02-16 LAB — ALBUMIN, PLEURAL OR PERITONEAL FLUID: Albumin, Fluid: <1.5 g/dL

## 2022-02-16 NOTE — Discharge Instructions (Signed)
Follow-up with Dr. Abbey Chatters as planned.

## 2022-02-16 NOTE — Procedures (Signed)
PreOperative Dx: Ascites Postoperative Dx: Ascites Procedure:   US guided paracentesis Radiologist:  Thornton Papas Anesthesia:  10 ml of 1% lidocaine Specimen:  2.8 L of yellow ascitic fluid EBL:   < 1 ml Complications:  None

## 2022-02-16 NOTE — ED Triage Notes (Signed)
Pt here for preop for a procedure he is having next THursday. Says was sent here for paracentesis.

## 2022-02-16 NOTE — Pre-Procedure Instructions (Signed)
Dr Abbey Chatters in to see patient and spoke to an ED provider for emergent paracentesis today. Patient verbalized understanding. To ED via W/C. I also spoke with radiology and gave them patient information.

## 2022-02-16 NOTE — Progress Notes (Signed)
Pt tolerated right sided paracentesis procedure well today and 2.8 Liters of clear yellow fluid removed with labs collected and sent for ultrasound tech for processing. PT verbalized understanding of post procedure instructions and returned to ED bed assignment at this time with no acute distress noted.

## 2022-02-16 NOTE — ED Provider Notes (Signed)
[facility] EMERGENCY DEPARTMENT Provider Note   CSN: [id number] Arrival date & time: 02/16/22  1530     History {Add pertinent medical, surgical, social history, OB history to HPI:1} Chief Complaint  Patient presents with   Abdominal Pain    Patrick Humphrey is a 56 y.o. male.  Patient has a history of cardiomyopathy and presented to his GI doctor with abdominal distention and abdominal discomfort.  His GI doctor sent him to the emergency department to have a paracentesis done and to be discharged after that   Abdominal Pain      Home Medications Prior to Admission medications   Medication Sig Start Date End Date Taking? Authorizing Provider  ALPRAZolam Duanne Moron) 0.5 MG tablet Take 0.5 mg by mouth daily as needed for anxiety.    [provider]  amLODipine (NORVASC) 5 MG tablet Take 5 mg by mouth daily.    [provider]  aspirin EC 81 MG tablet Take 81 mg by mouth daily.    [provider]  cetirizine (ZYRTEC) 10 MG tablet Take 10 mg by mouth daily.    [provider]  dicyclomine (BENTYL) 10 MG capsule Take 1 capsule (10 mg total) by mouth every 12 (twelve) hours as needed for spasms. 02/01/22   Carlan, Chelsea L, NP  fluticasone (FLONASE) 50 MCG/ACT nasal spray Place 2 sprays into both nostrils daily.     [provider]  mycophenolate (MYFORTIC) 360 MG TBEC EC tablet Take 360 mg by mouth 2 (two) times daily.    [provider]  olopatadine (PATADAY) 0.1 % ophthalmic solution Place 1 drop into both eyes daily as needed (Red or itching eyes).    [provider]  pantoprazole (PROTONIX) 40 MG tablet Take 40 mg by mouth 2 (two) times daily.     [provider]  polyethylene glycol-electrolytes (TRILYTE) 420 g solution Take 4,000 mLs by mouth as directed. 02/01/22   Harvel Quale, MD  predniSONE (DELTASONE) 2.5 MG tablet Take 2.5 mg by mouth daily. 11/10/21   [provider]  tacrolimus  (PROGRAF) 1 MG capsule Take 2-3 mg by mouth See admin instructions. Take 3 mg in the morning  and 2 mg at night    [provider]  tamsulosin (FLOMAX) 0.4 MG CAPS capsule Take 0.4 mg by mouth daily.    [provider]      Allergies    Penicillins, Propranolol, and Sulfa antibiotics    Review of Systems   Review of Systems  Gastrointestinal:  Positive for abdominal pain.    Physical Exam Updated Vital Signs BP (!) 145/99   Pulse 88   Temp 98 F (36.7 C)   Resp 18   Ht '5\' 11"'$  (1.803 m)   Wt 72.6 kg   SpO2 99%   BMI 22.32 kg/m  Physical Exam  ED Results / Procedures / Treatments   Labs (all labs ordered are listed, but only abnormal results are displayed) Labs Reviewed  GRAM STAIN  CULTURE, BODY FLUID W GRAM STAIN -BOTTLE  BODY FLUID CELL COUNT WITH DIFFERENTIAL  ALBUMIN, PLEURAL OR PERITONEAL FLUID   CYTOLOGY - NON PAP    EKG None  Radiology US Paracentesis  Result Date: 02/16/2022 Lavonia Dana, MD     02/16/2022  4:34 PM PreOperative Dx: Ascites Postoperative Dx: Ascites Procedure:   US guided paracentesis Radiologist:  Thornton Papas Anesthesia:  10 ml of 1% lidocaine Specimen:  2.8 L of yellow ascitic fluid EBL:   <  1 ml Complications:  None   Procedures Procedures  {Document cardiac monitor, telemetry assessment procedure when appropriate:1}  Medications Ordered in ED Medications - No data to display  ED Course/ Medical Decision Making/ A&P                           Medical Decision Making Amount and/or Complexity of Data Reviewed Labs: ordered.   Patient with paracentesis.  Patient feels much better with no abdominal discomfort he will be discharged home to follow-up with his GI doctor  {Document critical care time when appropriate:1} {Document review of labs and clinical decision tools ie heart score, Chads2Vasc2 etc:1}  {Document your independent review of radiology images, and any outside records:1} {Document your discussion with  family members, caretakers, and with consultants:1} {Document social determinants of health affecting pt's care:1} {Document your decision making why or why not admission, treatments were needed:1} Final Clinical Impression(s) / ED Diagnoses Final diagnoses:  Periumbilical abdominal pain    Rx / DC Orders ED Discharge Orders     None

## 2022-02-16 NOTE — Pre-Procedure Instructions (Signed)
Patient complains of new onset of SOB at times when has abdominal distention. Abd is very taught at present. Also has new onset of foot and ankle edema. Edema is a 1-2 plus. Patient states these new symptoms have occurred since he saw [facility] in office on 02/01/2022. I spoke with dr Abbey Chatters directly and he is going to come see patient when he is finished with current procedure.

## 2022-02-16 NOTE — Patient Instructions (Signed)
Patrick Humphrey  02/16/2022     '@PREFPERIOPPHARMACY'$ @   Your procedure is scheduled on  02/22/2022.   Report to [facility] at  1130  A.M.   Call this number if you have problems the morning of surgery:  [phone]  If you experience any cold or flu symptoms such as cough, fever, chills, shortness of breath, etc. between now and your scheduled surgery, please notify us at the above number.   Remember:  Follow the diet and prep instructions given to you by the office.      Take these medicines the morning of surgery with A SIP OF WATER            xanax, amlodipine, zyrtec, myfortic, protonix, prednisone, prograf, flomax.    Do not wear jewelry, make-up or nail polish.  Do not wear lotions, powders, or perfumes, or deodorant.  Do not shave 48 hours prior to surgery.  Men may shave face and neck.  Do not bring valuables to the hospital.  [facility] is not responsible for any belongings or valuables.  Contacts, dentures or bridgework may not be worn into surgery.  Leave your suitcase in the car.  After surgery it may be brought to your room.  For patients admitted to the hospital, discharge time will be determined by your treatment team.  Patients discharged the day of surgery will not be allowed to drive home and must have someone with them for 24 hours.    Special instructions:   DO NOT smoke tobacco or vape for 24 hours before your procedure.  Please read over the following fact sheets that you were given. Anesthesia Post-op Instructions and Care and Recovery After Surgery      Colonoscopy, Adult, Care After The following information offers guidance on how to care for yourself after your procedure. Your health care provider may also give you more specific instructions. If you have problems or questions, contact your health care provider. What can I expect after the procedure? After the procedure, it is common to have: A small amount of blood in your  stool for 24 hours after the procedure. Some gas. Mild cramping or bloating of your abdomen. Follow these instructions at home: Eating and drinking  Drink enough fluid to keep your urine pale yellow. Follow instructions from your health care provider about eating or drinking restrictions. Resume your normal diet as told by your health care provider. Avoid heavy or fried foods that are hard to digest. Activity Rest as told by your health care provider. Avoid sitting for a long time without moving. Get up to take short walks every 1-2 hours. This is important to improve blood flow and breathing. Ask for help if you feel weak or unsteady. Return to your normal activities as told by your health care provider. Ask your health care provider what activities are safe for you. Managing cramping and bloating  Try walking around when you have cramps or feel bloated. If directed, apply heat to your abdomen as told by your health care provider. Use the heat source that your health care provider recommends, such as a moist heat pack or a heating pad. Place a towel between your skin and the heat source. Leave the heat on for 20-30 minutes. Remove the heat if your skin turns bright red. This is especially important if you are unable to feel pain, heat, or cold. You have a greater risk of getting burned. General instructions If you  were given a sedative during the procedure, it can affect you for several hours. Do not drive or operate machinery until your health care provider says that it is safe. For the first 24 hours after the procedure: Do not sign important documents. Do not drink alcohol. Do your regular daily activities at a slower pace than normal. Eat soft foods that are easy to digest. Take over-the-counter and prescription medicines only as told by your health care provider. Keep all follow-up visits. This is important. Contact a health care provider if: You have blood in your stool 2-3 days  after the procedure. Get help right away if: You have more than a small spotting of blood in your stool. You have large blood clots in your stool. You have swelling of your abdomen. You have nausea or vomiting. You have a fever. You have increasing pain in your abdomen that is not relieved with medicine. These symptoms may be an emergency. Get help right away. Call 911. Do not wait to see if the symptoms will go away. Do not drive yourself to the hospital. Summary After the procedure, it is common to have a small amount of blood in your stool. You may also have mild cramping and bloating of your abdomen. If you were given a sedative during the procedure, it can affect you for several hours. Do not drive or operate machinery until your health care provider says that it is safe. Get help right away if you have a lot of blood in your stool, nausea or vomiting, a fever, or increased pain in your abdomen. This information is not intended to replace advice given to you by your health care provider. Make sure you discuss any questions you have with your health care provider. Document Revised: 11/16/2020 Document Reviewed: 11/16/2020 Elsevier Patient Education  [place] After The following information offers guidance on how to care for yourself after your procedure. Your health care provider may also give you more specific instructions. If you have problems or questions, contact your health care provider. What can I expect after the procedure? After the procedure, it is common to have: Tiredness. Little or no memory about what happened during or after the procedure. Impaired judgment when it comes to making decisions. Nausea or vomiting. Some trouble with balance. Follow these instructions at home: For the time period you were told by your health care provider:  Rest. Do not participate in activities where you could fall or become injured. Do not  drive or use machinery. Do not drink alcohol. Do not take sleeping pills or medicines that cause drowsiness. Do not make important decisions or sign legal documents. Do not take care of children on your own. Medicines Take over-the-counter and prescription medicines only as told by your health care provider. If you were prescribed antibiotics, take them as told by your health care provider. Do not stop using the antibiotic even if you start to feel better. Eating and drinking Follow instructions from your health care provider about what you may eat and drink. Drink enough fluid to keep your urine pale yellow. If you vomit: Drink clear fluids slowly and in small amounts as you are able. Clear fluids include water, ice chips, low-calorie sports drinks, and fruit juice that has water added to it (diluted fruit juice). Eat light and bland foods in small amounts as you are able. These foods include bananas, applesauce, Morre, lean meats, toast, and crackers. General instructions  Have a responsible  adult stay with you for the time you are told. It is important to have someone help care for you until you are awake and alert. If you have sleep apnea, surgery and some medicines can increase your risk for breathing problems. Follow instructions from your health care provider about wearing your sleep device: When you are sleeping. This includes during daytime naps. While taking prescription pain medicines, sleeping medicines, or medicines that make you drowsy. Do not use any products that contain nicotine or tobacco. These products include cigarettes, chewing tobacco, and vaping devices, such as e-cigarettes. If you need help quitting, ask your health care provider. Contact a health care provider if: You feel nauseous or vomit every time you eat or drink. You feel light-headed. You are still sleepy or having trouble with balance after 24 hours. You get a rash. You have a fever. You have redness or  swelling around the IV site. Get help right away if: You have trouble breathing. You have new confusion after you get home. These symptoms may be an emergency. Get help right away. Call 911. Do not wait to see if the symptoms will go away. Do not drive yourself to the hospital. This information is not intended to replace advice given to you by your health care provider. Make sure you discuss any questions you have with your health care provider. Document Revised: 08/21/2021 Document Reviewed: 08/21/2021 Elsevier Patient Education  [place].

## 2022-02-20 LAB — CYTOLOGY - NON PAP

## 2022-02-21 LAB — CULTURE, BODY FLUID W GRAM STAIN -BOTTLE: Culture: NO GROWTH

## 2022-02-22 ENCOUNTER — Ambulatory Visit (HOSPITAL_COMMUNITY): Payer: Medicare Other | Admitting: Anesthesiology

## 2022-02-22 ENCOUNTER — Ambulatory Visit (HOSPITAL_COMMUNITY)
Admission: RE | Admit: 2022-02-22 | Discharge: 2022-02-22 | Disposition: A | Discharge status: Home / Self Care | Payer: Medicare Other | Attending: Internal Medicine | Admitting: Internal Medicine

## 2022-02-22 ENCOUNTER — Encounter (HOSPITAL_COMMUNITY)
Admission: RE | Disposition: A | Discharge status: Home / Self Care | Payer: Self-pay | Source: Home / Self Care | Attending: Internal Medicine

## 2022-02-22 ENCOUNTER — Encounter (HOSPITAL_COMMUNITY): Payer: Self-pay

## 2022-02-22 ENCOUNTER — Telehealth: Payer: Self-pay | Admitting: Internal Medicine

## 2022-02-22 ENCOUNTER — Ambulatory Visit (HOSPITAL_BASED_OUTPATIENT_CLINIC_OR_DEPARTMENT_OTHER): Payer: Medicare Other | Admitting: Anesthesiology

## 2022-02-22 DIAGNOSIS — K3189 Other diseases of stomach and duodenum: Secondary | ICD-10-CM | POA: Diagnosis not present

## 2022-02-22 DIAGNOSIS — I868 Varicose veins of other specified sites: Secondary | ICD-10-CM | POA: Insufficient documentation

## 2022-02-22 DIAGNOSIS — Z1212 Encounter for screening for malignant neoplasm of rectum: Secondary | ICD-10-CM | POA: Diagnosis not present

## 2022-02-22 DIAGNOSIS — Z95810 Presence of automatic (implantable) cardiac defibrillator: Secondary | ICD-10-CM | POA: Diagnosis not present

## 2022-02-22 DIAGNOSIS — I509 Heart failure, unspecified: Secondary | ICD-10-CM | POA: Diagnosis not present

## 2022-02-22 DIAGNOSIS — Z941 Heart transplant status: Secondary | ICD-10-CM | POA: Diagnosis not present

## 2022-02-22 DIAGNOSIS — R634 Abnormal weight loss: Secondary | ICD-10-CM

## 2022-02-22 DIAGNOSIS — R6881 Early satiety: Secondary | ICD-10-CM | POA: Insufficient documentation

## 2022-02-22 DIAGNOSIS — K766 Portal hypertension: Secondary | ICD-10-CM | POA: Insufficient documentation

## 2022-02-22 DIAGNOSIS — K2970 Gastritis, unspecified, without bleeding: Secondary | ICD-10-CM

## 2022-02-22 DIAGNOSIS — R194 Change in bowel habit: Secondary | ICD-10-CM

## 2022-02-22 DIAGNOSIS — K7460 Unspecified cirrhosis of liver: Secondary | ICD-10-CM | POA: Diagnosis not present

## 2022-02-22 DIAGNOSIS — R932 Abnormal findings on diagnostic imaging of liver and biliary tract: Secondary | ICD-10-CM

## 2022-02-22 DIAGNOSIS — I428 Other cardiomyopathies: Secondary | ICD-10-CM | POA: Diagnosis not present

## 2022-02-22 DIAGNOSIS — K2950 Unspecified chronic gastritis without bleeding: Secondary | ICD-10-CM | POA: Insufficient documentation

## 2022-02-22 DIAGNOSIS — Z87891 Personal history of nicotine dependence: Secondary | ICD-10-CM | POA: Insufficient documentation

## 2022-02-22 DIAGNOSIS — K635 Polyp of colon: Secondary | ICD-10-CM | POA: Diagnosis not present

## 2022-02-22 DIAGNOSIS — Z681 Body mass index (BMI) 19 or less, adult: Secondary | ICD-10-CM | POA: Insufficient documentation

## 2022-02-22 DIAGNOSIS — D123 Benign neoplasm of transverse colon: Secondary | ICD-10-CM | POA: Diagnosis not present

## 2022-02-22 DIAGNOSIS — K319 Disease of stomach and duodenum, unspecified: Secondary | ICD-10-CM | POA: Diagnosis not present

## 2022-02-22 DIAGNOSIS — R112 Nausea with vomiting, unspecified: Secondary | ICD-10-CM

## 2022-02-22 DIAGNOSIS — Z1211 Encounter for screening for malignant neoplasm of colon: Secondary | ICD-10-CM | POA: Diagnosis not present

## 2022-02-22 HISTORY — PX: BIOPSY: SHX5522

## 2022-02-22 HISTORY — PX: ESOPHAGOGASTRODUODENOSCOPY (EGD) WITH PROPOFOL: SHX5813

## 2022-02-22 HISTORY — PX: COLONOSCOPY WITH PROPOFOL: SHX5780

## 2022-02-22 HISTORY — PX: POLYPECTOMY: SHX5525

## 2022-02-22 SURGERY — COLONOSCOPY WITH PROPOFOL
Anesthesia: General

## 2022-02-22 MED ORDER — LACTATED RINGERS IV SOLN: INTRAVENOUS | Status: DC

## 2022-02-22 MED ORDER — DEXMEDETOMIDINE HCL IN NACL 80 MCG/20ML IV SOLN
INTRAVENOUS | Status: DC | PRN
  Administered 2022-02-22: 20 ug via BUCCAL

## 2022-02-22 MED ORDER — PROPOFOL 500 MG/50ML IV EMUL
INTRAVENOUS | Status: DC | PRN
  Administered 2022-02-22: 150 ug/kg/min via INTRAVENOUS

## 2022-02-22 MED ORDER — PROPOFOL 10 MG/ML IV BOLUS
INTRAVENOUS | Status: DC | PRN
  Administered 2022-02-22: 50 mg via INTRAVENOUS

## 2022-02-22 NOTE — Telephone Encounter (Signed)
Can we please refer this patient to [facility] in [place] diagnosis decompensated cirrhosis, ascites.  Thank you

## 2022-02-22 NOTE — Anesthesia Postprocedure Evaluation (Signed)
Anesthesia Post Note  Patient: Patrick Humphrey  Procedure(s) Performed: COLONOSCOPY WITH PROPOFOL ESOPHAGOGASTRODUODENOSCOPY (EGD) WITH PROPOFOL BIOPSY POLYPECTOMY  Patient location during evaluation: Phase II Anesthesia Type: General Level of consciousness: awake and alert Pain management: pain level controlled Vital Signs Assessment: post-procedure vital signs reviewed and stable Respiratory status: spontaneous breathing, nonlabored ventilation, respiratory function stable and patient connected to nasal cannula oxygen Cardiovascular status: blood pressure returned to baseline and stable Postop Assessment: no apparent nausea or vomiting Anesthetic complications: no   No notable events documented.   Last Vitals:  Vitals:   02/22/22 1145 02/22/22 1353  BP:  93/62  Pulse: 94 75  Resp: 12 20  Temp:  36.6 C  SpO2: 100% 99%    Last Pain:  Vitals:   02/22/22 1353  TempSrc: Axillary  PainSc: 0-No pain                 Abdul Beirne Clyde Canterbury

## 2022-02-22 NOTE — Anesthesia Preprocedure Evaluation (Signed)
Anesthesia Evaluation  Patient identified by MRN, date of birth, ID band Patient awake    Reviewed: Allergy & Precautions, H&P , NPO status , Patient's Chart, lab work & pertinent test results, reviewed documented beta blocker date and time   Airway Mallampati: I  TM Distance: >3 FB Neck ROM: full    Dental no notable dental hx.    Pulmonary neg pulmonary ROS, former smoker   Pulmonary exam normal breath sounds clear to auscultation       Cardiovascular Exercise Tolerance: Good  Rhythm:regular Rate:Normal  S/p heart transplant 2018   Neuro/Psych negative neurological ROS  negative psych ROS   GI/Hepatic negative GI ROS, Neg liver ROS,,,  Endo/Other  negative endocrine ROS    Renal/GU CRFRenal disease  negative genitourinary   Musculoskeletal   Abdominal   Peds  Hematology negative hematology ROS (+)   Anesthesia Other Findings S/p heart transplant 2018 for dilated cardiomyopathy; AICD removed at that time  Reproductive/Obstetrics negative OB ROS                             Anesthesia Physical Anesthesia Plan  ASA: 3  Anesthesia Plan: General   Post-op Pain Management:    Induction:   PONV Risk Score and Plan:   Airway Management Planned:   Additional Equipment:   Intra-op Plan:   Post-operative Plan:   Informed Consent: I have reviewed the patients History and Physical, chart, labs and discussed the procedure including the risks, benefits and alternatives for the proposed anesthesia with the patient or authorized representative who has indicated his/her understanding and acceptance.     Dental Advisory Given  Plan Discussed with: CRNA  Anesthesia Plan Comments:         Anesthesia Quick Evaluation

## 2022-02-22 NOTE — Op Note (Signed)
[facility] Patient Name: Patrick Humphrey Procedure Date: 02/22/2022 1:30 PM MRN: [id number] Date of Birth: [birth year] Attending MD: Elon Alas. Abbey Chatters , [place], [id number] CSN: [id number] Age: 56 Admit Type: Outpatient Procedure:                Colonoscopy Indications:              Screening for colorectal malignant neoplasm Providers:                Elon Alas. Abbey Chatters, DO, Lambert Mody, Dereck Leep, Technician Referring MD:              Medicines:                See the Anesthesia note for documentation of the                            administered medications Complications:            No immediate complications. Estimated Blood Loss:     Estimated blood loss was minimal. Procedure:                Pre-Anesthesia Assessment:                           - The anesthesia plan was to use monitored                            anesthesia care (MAC).                           After obtaining informed consent, the colonoscope                            was passed under direct vision. Throughout the                            procedure, the patient's blood pressure, pulse, and                            oxygen saturations were monitored continuously. The                            [id number]-HQ190L ([id number]) scope was introduced through                            the anus and advanced to the the terminal ileum,                            with identification of the appendiceal orifice and                            IC valve. The colonoscopy was performed without                            difficulty. The patient  tolerated the procedure                            well. The quality of the bowel preparation was                            evaluated using the BBPS [facility] Bowel Preparation                            Scale) with scores of: Right Colon = 3, Transverse                            Colon = 3 and Left Colon = 3 (entire mucosa seen                             well with no residual staining, small fragments of                            stool or opaque liquid). The total BBPS score                            equals 9. Scope In: 1:32:16 PM Scope Out: 1:47:08 PM Scope Withdrawal Time: 0 hours 7 minutes 36 seconds  Total Procedure Duration: 0 hours 14 minutes 52 seconds  Findings:      The perianal and digital rectal examinations were normal.      Non-bleeding rectal varices were found.      A 6 mm polyp was found in the hepatic flexure. The polyp was sessile.       The polyp was removed with a cold snare. Resection and retrieval were       complete.      The exam was otherwise without abnormality. Impression:               - Rectal varices.                           - One 6 mm polyp at the hepatic flexure, removed                            with a cold snare. Resected and retrieved.                           - The examination was otherwise normal. Moderate Sedation:      Per Anesthesia Care Recommendation:           - Patient has a contact number available for                            emergencies. The signs and symptoms of potential                            delayed complications were discussed with the                            patient. Return to normal  activities tomorrow.                            Written discharge instructions were provided to the                            patient.                           - Resume previous diet.                           - Continue present medications.                           - Await pathology results.                           - Repeat colonoscopy in 5 years for surveillance.                           - Return to GI clinic in 4 weeks. Procedure Code(s):        --- Professional ---                           [phone], Colonoscopy, flexible; with removal of                            tumor(s), polyp(s), or other lesion(s) by snare                            technique Diagnosis Code(s):         --- Professional ---                           Z12.11, Encounter for screening for malignant                            neoplasm of colon                           D12.3, Benign neoplasm of transverse colon (hepatic                            flexure or splenic flexure)                           K64.8, Other hemorrhoids CPT copyright 2022 American Medical Association. All rights reserved. The codes documented in this report are preliminary and upon coder review may  be revised to meet current compliance requirements. Elon Alas. Abbey Chatters, DO [place] Abbey Chatters, DO 02/22/2022 1:51:47 PM This report has been signed electronically. Number of Addenda: 0

## 2022-02-22 NOTE — Interval H&P Note (Signed)
History and Physical Interval Note:  02/22/2022 11:32 AM  Patrick Humphrey  has presented today for surgery, with the diagnosis of Nausea Vomiting Abnormal Weight Loss change in bowel habits.  The various methods of treatment have been discussed with the patient and family. After consideration of risks, benefits and other options for treatment, the patient has consented to  Procedure(s) with comments: COLONOSCOPY WITH PROPOFOL (N/A) - 105 ASA 3, pt knows to arrive at 11:00 ESOPHAGOGASTRODUODENOSCOPY (EGD) WITH PROPOFOL (N/A) as a surgical intervention.  The patient's history has been reviewed, patient examined, no change in status, stable for surgery.  I have reviewed the patient's chart and labs.  Questions were answered to the patient's satisfaction.     Eloise Harman

## 2022-02-22 NOTE — Transfer of Care (Signed)
Immediate Anesthesia Transfer of Care Note  Patient: Patrick Humphrey  Procedure(s) Performed: COLONOSCOPY WITH PROPOFOL ESOPHAGOGASTRODUODENOSCOPY (EGD) WITH PROPOFOL BIOPSY POLYPECTOMY  Patient Location: Short Stay  Anesthesia Type:General  Level of Consciousness: awake, alert , oriented, and patient cooperative  Airway & Oxygen Therapy: Patient Spontanous Breathing  Post-op Assessment: Report given to RN, Post -op Vital signs reviewed and stable, and Patient moving all extremities X 4  Post vital signs: Reviewed and stable  Last Vitals:  Vitals Value Taken Time  BP 93/62 02/22/22 1353  Temp 36.6 C 02/22/22 1353  Pulse 75 02/22/22 1353  Resp 20 02/22/22 1353  SpO2 99 % 02/22/22 1353    Last Pain:  Vitals:   02/22/22 1353  TempSrc: Axillary  PainSc: 0-No pain         Complications: No notable events documented.

## 2022-02-22 NOTE — Discharge Instructions (Addendum)
EGD Discharge instructions Please read the instructions outlined below and refer to this sheet in the next few weeks. These discharge instructions provide you with general information on caring for yourself after you leave the hospital. Your doctor may also give you specific instructions. While your treatment has been planned according to the most current medical practices available, unavoidable complications occasionally occur. If you have any problems or questions after discharge, please call your doctor. ACTIVITY You may resume your regular activity but move at a slower pace for the next 24 hours.  Take frequent rest periods for the next 24 hours.  Walking will help expel (get rid of) the air and reduce the bloated feeling in your abdomen.  No driving for 24 hours (because of the anesthesia (medicine) used during the test).  You may shower.  Do not sign any important legal documents or operate any machinery for 24 hours (because of the anesthesia used during the test).  NUTRITION Drink plenty of fluids.  You may resume your normal diet.  Begin with a light meal and progress to your normal diet.  Avoid alcoholic beverages for 24 hours or as instructed by your caregiver.  MEDICATIONS You may resume your normal medications unless your caregiver tells you otherwise.  WHAT YOU CAN EXPECT TODAY You may experience abdominal discomfort such as a feeling of fullness or "gas" pains.  FOLLOW-UP Your doctor will discuss the results of your test with you.  SEEK IMMEDIATE MEDICAL ATTENTION IF ANY OF THE FOLLOWING OCCUR: Excessive nausea (feeling sick to your stomach) and/or vomiting.  Severe abdominal pain and distention (swelling).  Trouble swallowing.  Temperature over 101 F (37.8 C).  Rectal bleeding or vomiting of blood.     Colonoscopy Discharge Instructions  Read the instructions outlined below and refer to this sheet in the next few weeks. These discharge instructions provide you with  general information on caring for yourself after you leave the hospital. Your doctor may also give you specific instructions. While your treatment has been planned according to the most current medical practices available, unavoidable complications occasionally occur.   ACTIVITY You may resume your regular activity, but move at a slower pace for the next 24 hours.  Take frequent rest periods for the next 24 hours.  Walking will help get rid of the air and reduce the bloated feeling in your belly (abdomen).  No driving for 24 hours (because of the medicine (anesthesia) used during the test).   Do not sign any important legal documents or operate any machinery for 24 hours (because of the anesthesia used during the test).  NUTRITION Drink plenty of fluids.  You may resume your normal diet as instructed by your doctor.  Begin with a light meal and progress to your normal diet. Heavy or fried foods are harder to digest and may make you feel sick to your stomach (nauseated).  Avoid alcoholic beverages for 24 hours or as instructed.  MEDICATIONS You may resume your normal medications unless your doctor tells you otherwise.  WHAT YOU CAN EXPECT TODAY Some feelings of bloating in the abdomen.  Passage of more gas than usual.  Spotting of blood in your stool or on the toilet paper.  IF YOU HAD POLYPS REMOVED DURING THE COLONOSCOPY: No aspirin products for 7 days or as instructed.  No alcohol for 7 days or as instructed.  Eat a soft diet for the next 24 hours.  FINDING OUT THE RESULTS OF YOUR TEST Not all test results are  available during your visit. If your test results are not back during the visit, make an appointment with your caregiver to find out the results. Do not assume everything is normal if you have not heard from your caregiver or the medical facility. It is important for you to follow up on all of your test results.  SEEK IMMEDIATE MEDICAL ATTENTION IF: You have more than a spotting of  blood in your stool.  Your belly is swollen (abdominal distention).  You are nauseated or vomiting.  You have a temperature over 101.  You have abdominal pain or discomfort that is severe or gets worse throughout the day.   Your EGD revealed mild amount inflammation in your stomach.  I took biopsies of this to rule out infection with a bacteria called H. pylori.  Await pathology results, my office will contact you.  Esophagus appeared normal.  No evidence of esophageal varices.  Small bowel appeared normal.  Continue on pantoprazole twice daily.  Repeat EGD in 2 years  Your colonoscopy revealed 1 polyp which I removed successfully.  We will call you with these results as well.  Repeat colonoscopy 5 years.  You also have rectal varices related to your chronic liver disease.  Need to keep your bowels moving well.  Follow-up with GI in 4 weeks  I hope you have a great rest of your week!  Elon Alas. Abbey Chatters, D.O. Gastroenterology and Hepatology [facility]

## 2022-02-22 NOTE — Op Note (Signed)
[facility] Patient Name: Patrick Humphrey Procedure Date: 02/22/2022 12:30 PM MRN: [id number] Date of Birth: 22-Dec-1965 Attending MD: Elon Alas. Abbey Chatters , [place], [id number] CSN: [id number] Age: 56 Admit Type: Outpatient Procedure:                Upper GI endoscopy Indications:              Cirrhosis rule out esophageal varices, Abnormal CT                            of the GI tract Providers:                Elon Alas. Abbey Chatters, DO, Lambert Mody, Dereck Leep, Technician Referring MD:              Medicines:                See the Anesthesia note for documentation of the                            administered medications Complications:            No immediate complications. Estimated Blood Loss:     Estimated blood loss was minimal. Procedure:                Pre-Anesthesia Assessment:                           - The anesthesia plan was to use monitored                            anesthesia care (MAC).                           After obtaining informed consent, the endoscope was                            passed under direct vision. Throughout the                            procedure, the patient's blood pressure, pulse, and                            oxygen saturations were monitored continuously. The                            [id number]-H190 ([id number]) scope was introduced through the                            mouth, and advanced to the second part of duodenum.                            The upper GI endoscopy was accomplished without                            difficulty.  The patient tolerated the procedure                            well. Scope In: 1:24:31 PM Scope Out: 1:28:04 PM Total Procedure Duration: 0 hours 3 minutes 33 seconds  Findings:      There is no endoscopic evidence of varices in the entire esophagus.      The Z-line was regular.      Patchy mild inflammation characterized by erythema was found in the       gastric body and  in the gastric antrum. Biopsies were taken with a cold       forceps for Helicobacter pylori testing.      Mild portal hypertensive gastropathy was found in the gastric body.      The duodenal bulb, first portion of the duodenum and second portion of       the duodenum were normal. Impression:               - Z-line regular.                           - Gastritis. Biopsied.                           - Portal hypertensive gastropathy.                           - Normal duodenal bulb, first portion of the                            duodenum and second portion of the duodenum. Moderate Sedation:      Per Anesthesia Care Recommendation:           - Patient has a contact number available for                            emergencies. The signs and symptoms of potential                            delayed complications were discussed with the                            patient. Return to normal activities tomorrow.                            Written discharge instructions were provided to the                            patient.                           - Resume previous diet.                           - Continue present medications.                           - Await pathology results.                           -  Repeat upper endoscopy in 2 years for screening                            purposes.                           - Return to GI clinic in 4 weeks. Procedure Code(s):        --- Professional ---                           [phone], Esophagogastroduodenoscopy, flexible,                            transoral; with biopsy, single or multiple Diagnosis Code(s):        --- Professional ---                           K29.70, Gastritis, unspecified, without bleeding                           K76.6, Portal hypertension                           K31.89, Other diseases of stomach and duodenum                           K74.60, Unspecified cirrhosis of liver                           R93.3, Abnormal  findings on diagnostic imaging of                            other parts of digestive tract CPT copyright 2022 American Medical Association. All rights reserved. The codes documented in this report are preliminary and upon coder review may  be revised to meet current compliance requirements. Elon Alas. Abbey Chatters, DO Blasdell Abbey Chatters, DO 02/22/2022 1:31:18 PM This report has been signed electronically. Number of Addenda: 0

## 2022-02-23 ENCOUNTER — Encounter: Payer: Self-pay | Admitting: *Deleted

## 2022-02-23 DIAGNOSIS — R932 Abnormal findings on diagnostic imaging of liver and biliary tract: Secondary | ICD-10-CM

## 2022-02-23 LAB — SURGICAL PATHOLOGY

## 2022-02-26 ENCOUNTER — Telehealth: Payer: Self-pay

## 2022-02-26 ENCOUNTER — Other Ambulatory Visit (INDEPENDENT_AMBULATORY_CARE_PROVIDER_SITE_OTHER): Payer: Self-pay | Admitting: *Deleted

## 2022-02-26 DIAGNOSIS — R188 Other ascites: Secondary | ICD-10-CM

## 2022-02-26 NOTE — Telephone Encounter (Signed)
Does he need any labs ordered with para and albumin? Does he have a limit he can have drawn off?

## 2022-02-26 NOTE — Telephone Encounter (Signed)
Pt's wife called and advised that pt needs to have fluid drawn off again. This was done on the 10th of November. Also will the pt need an appt before going to see the specialist on December 12th. Please advise

## 2022-02-26 NOTE — Telephone Encounter (Signed)
PARA scheduled for 11/21, arrival 8:45am. Called pt and is aware of appt details.

## 2022-02-26 NOTE — Telephone Encounter (Signed)
Tammy referred pt 11/17

## 2022-02-27 ENCOUNTER — Encounter (HOSPITAL_COMMUNITY): Payer: Self-pay

## 2022-02-27 ENCOUNTER — Ambulatory Visit (HOSPITAL_COMMUNITY)
Admission: RE | Admit: 2022-02-27 | Discharge: 2022-02-27 | Disposition: A | Discharge status: Home / Self Care | Payer: Medicare Other | Source: Ambulatory Visit | Attending: Gastroenterology | Admitting: Gastroenterology

## 2022-02-27 ENCOUNTER — Encounter: Payer: Self-pay | Admitting: Gastroenterology

## 2022-02-27 ENCOUNTER — Ambulatory Visit (INDEPENDENT_AMBULATORY_CARE_PROVIDER_SITE_OTHER): Payer: Medicare Other | Admitting: Gastroenterology

## 2022-02-27 VITALS — BP 134/90 | HR 105 | Temp 97.4°F | Ht 71.0 in | Wt 154.8 lb

## 2022-02-27 DIAGNOSIS — Z880 Allergy status to penicillin: Secondary | ICD-10-CM | POA: Diagnosis not present

## 2022-02-27 DIAGNOSIS — R5383 Other fatigue: Secondary | ICD-10-CM | POA: Diagnosis not present

## 2022-02-27 DIAGNOSIS — E860 Dehydration: Secondary | ICD-10-CM | POA: Diagnosis not present

## 2022-02-27 DIAGNOSIS — I129 Hypertensive chronic kidney disease with stage 1 through stage 4 chronic kidney disease, or unspecified chronic kidney disease: Secondary | ICD-10-CM | POA: Diagnosis not present

## 2022-02-27 DIAGNOSIS — R7989 Other specified abnormal findings of blood chemistry: Secondary | ICD-10-CM | POA: Diagnosis not present

## 2022-02-27 DIAGNOSIS — F1090 Alcohol use, unspecified, uncomplicated: Secondary | ICD-10-CM | POA: Diagnosis not present

## 2022-02-27 DIAGNOSIS — I10 Essential (primary) hypertension: Secondary | ICD-10-CM | POA: Diagnosis not present

## 2022-02-27 DIAGNOSIS — Z882 Allergy status to sulfonamides status: Secondary | ICD-10-CM | POA: Diagnosis not present

## 2022-02-27 DIAGNOSIS — N400 Enlarged prostate without lower urinary tract symptoms: Secondary | ICD-10-CM

## 2022-02-27 DIAGNOSIS — R748 Abnormal levels of other serum enzymes: Secondary | ICD-10-CM | POA: Diagnosis not present

## 2022-02-27 DIAGNOSIS — R3911 Hesitancy of micturition: Secondary | ICD-10-CM | POA: Diagnosis not present

## 2022-02-27 DIAGNOSIS — R9431 Abnormal electrocardiogram [ECG] [EKG]: Secondary | ICD-10-CM | POA: Diagnosis not present

## 2022-02-27 DIAGNOSIS — Z79621 Long term (current) use of calcineurin inhibitor: Secondary | ICD-10-CM | POA: Diagnosis not present

## 2022-02-27 DIAGNOSIS — R109 Unspecified abdominal pain: Secondary | ICD-10-CM | POA: Diagnosis not present

## 2022-02-27 DIAGNOSIS — I4892 Unspecified atrial flutter: Secondary | ICD-10-CM | POA: Diagnosis not present

## 2022-02-27 DIAGNOSIS — K766 Portal hypertension: Secondary | ICD-10-CM | POA: Diagnosis not present

## 2022-02-27 DIAGNOSIS — Z79899 Other long term (current) drug therapy: Secondary | ICD-10-CM | POA: Diagnosis not present

## 2022-02-27 DIAGNOSIS — K7290 Hepatic failure, unspecified without coma: Secondary | ICD-10-CM | POA: Diagnosis not present

## 2022-02-27 DIAGNOSIS — N401 Enlarged prostate with lower urinary tract symptoms: Secondary | ICD-10-CM | POA: Diagnosis present

## 2022-02-27 DIAGNOSIS — Z299 Encounter for prophylactic measures, unspecified: Secondary | ICD-10-CM | POA: Diagnosis not present

## 2022-02-27 DIAGNOSIS — D649 Anemia, unspecified: Secondary | ICD-10-CM | POA: Diagnosis not present

## 2022-02-27 DIAGNOSIS — N184 Chronic kidney disease, stage 4 (severe): Secondary | ICD-10-CM | POA: Diagnosis not present

## 2022-02-27 DIAGNOSIS — K7460 Unspecified cirrhosis of liver: Secondary | ICD-10-CM | POA: Diagnosis not present

## 2022-02-27 DIAGNOSIS — Z79624 Long term (current) use of inhibitors of nucleotide synthesis: Secondary | ICD-10-CM | POA: Diagnosis not present

## 2022-02-27 DIAGNOSIS — K769 Liver disease, unspecified: Secondary | ICD-10-CM | POA: Diagnosis not present

## 2022-02-27 DIAGNOSIS — Z8249 Family history of ischemic heart disease and other diseases of the circulatory system: Secondary | ICD-10-CM | POA: Diagnosis not present

## 2022-02-27 DIAGNOSIS — F419 Anxiety disorder, unspecified: Secondary | ICD-10-CM | POA: Diagnosis present

## 2022-02-27 DIAGNOSIS — N182 Chronic kidney disease, stage 2 (mild): Secondary | ICD-10-CM | POA: Diagnosis not present

## 2022-02-27 DIAGNOSIS — Z95810 Presence of automatic (implantable) cardiac defibrillator: Secondary | ICD-10-CM | POA: Diagnosis not present

## 2022-02-27 DIAGNOSIS — R188 Other ascites: Secondary | ICD-10-CM | POA: Insufficient documentation

## 2022-02-27 DIAGNOSIS — I5040 Unspecified combined systolic (congestive) and diastolic (congestive) heart failure: Secondary | ICD-10-CM | POA: Diagnosis not present

## 2022-02-27 DIAGNOSIS — Z7952 Long term (current) use of systemic steroids: Secondary | ICD-10-CM | POA: Diagnosis not present

## 2022-02-27 DIAGNOSIS — E875 Hyperkalemia: Secondary | ICD-10-CM | POA: Diagnosis not present

## 2022-02-27 DIAGNOSIS — N1832 Chronic kidney disease, stage 3b: Secondary | ICD-10-CM | POA: Diagnosis not present

## 2022-02-27 DIAGNOSIS — I428 Other cardiomyopathies: Secondary | ICD-10-CM | POA: Diagnosis not present

## 2022-02-27 DIAGNOSIS — M109 Gout, unspecified: Secondary | ICD-10-CM | POA: Diagnosis not present

## 2022-02-27 DIAGNOSIS — R338 Other retention of urine: Secondary | ICD-10-CM | POA: Diagnosis not present

## 2022-02-27 DIAGNOSIS — Z7982 Long term (current) use of aspirin: Secondary | ICD-10-CM | POA: Diagnosis not present

## 2022-02-27 DIAGNOSIS — I452 Bifascicular block: Secondary | ICD-10-CM | POA: Diagnosis not present

## 2022-02-27 DIAGNOSIS — D84821 Immunodeficiency due to drugs: Secondary | ICD-10-CM | POA: Diagnosis not present

## 2022-02-27 DIAGNOSIS — Z87891 Personal history of nicotine dependence: Secondary | ICD-10-CM | POA: Diagnosis not present

## 2022-02-27 DIAGNOSIS — K7469 Other cirrhosis of liver: Secondary | ICD-10-CM

## 2022-02-27 DIAGNOSIS — Z941 Heart transplant status: Secondary | ICD-10-CM | POA: Diagnosis not present

## 2022-02-27 DIAGNOSIS — I4891 Unspecified atrial fibrillation: Secondary | ICD-10-CM | POA: Diagnosis not present

## 2022-02-27 DIAGNOSIS — K767 Hepatorenal syndrome: Secondary | ICD-10-CM | POA: Diagnosis not present

## 2022-02-27 DIAGNOSIS — E872 Acidosis, unspecified: Secondary | ICD-10-CM | POA: Diagnosis not present

## 2022-02-27 DIAGNOSIS — N170 Acute kidney failure with tubular necrosis: Secondary | ICD-10-CM | POA: Diagnosis not present

## 2022-02-27 DIAGNOSIS — N179 Acute kidney failure, unspecified: Secondary | ICD-10-CM | POA: Diagnosis not present

## 2022-02-27 LAB — BODY FLUID CELL COUNT WITH DIFFERENTIAL
Eos, Fluid: 0 %
Lymphs, Fluid: 70 %
Monocyte-Macrophage-Serous Fluid: 16 % — ABNORMAL LOW (ref 50–90)
Neutrophil Count, Fluid: 14 % (ref 0–25)
Total Nucleated Cell Count, Fluid: 161 cu mm (ref 0–1000)

## 2022-02-27 LAB — GRAM STAIN

## 2022-02-27 MED ORDER — ONDANSETRON HCL 4 MG PO TABS: 4.0000 mg | ORAL_TABLET | Freq: Three times a day (TID) | ORAL | 1 refills | Status: DC | PRN

## 2022-02-27 NOTE — Progress Notes (Signed)
Paracentesis complete no signs of distress.  

## 2022-02-27 NOTE — Progress Notes (Unsigned)
Gastroenterology Office Note     Primary Care Physician:  Monico Blitz, MD  Primary Gastroenterologist: Dr. Jenetta Downer    Chief Complaint   Chief Complaint  Patient presents with   Follow-up    Follow up on EGD and colonoscopy. Pt still not able to eat     History of Present Illness   Patrick Humphrey is a 56 y.o. male presenting today as an urgent visit with a past medical history of AICD, CHF, CKD, NICM diagnosed in 2002 s/p AICD in 2009, s/p LVAD  in 5/72/6203 complicated by RV failure and afib/flutter, undergoing heart transplant in 2018, nonischemic cardiomyopathy.  On mycophenolate and tacrolimus. Followed by [facility] with history of heart transplant.   Recently found to have cirrhosis via imaging as noted below. 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 He has had quick decompensation with need for first para on 02/16/22 with 2.8 liters removed, negative cell count. Repeat para today with 3.3 liters removed. Negative cell count. Serologies: elevated ferritin at 1,000, Iron sats 38, AMA negative, IgG normal, IgA elevated at 690, IgM normal, ceruloplasmin 24.7, ASMA negative, ANA negative, alpha-1 antitrypsin normal, Hep B surface antigen negative, Hep C antibody negative. Needs A/B vaccinations. May 2023: AST 97, otherwise normal LFTs. Looking back in [place], he had elevated LFTs starting in 2022.  Tbili 2.3, Alk Phos 177, AST 118, ALT 51 in Oct 2023.   Creatinine 3.09 in Oct 2023, previously 2.40 01/17/2022. He has had unintentional weight loss over past 3 months.   Upcoming Liver clinic on 12/12. Dec 1st Heart Specialist. Dr. Theador Hawthorne (Nephrology) this upcoming Saturday. No added salt. Low sodium diet. Feels sleepy, tired. Not sleeping well at night as itching bad. Poor urinary stream, just  dribbles. Drinks lots of water. Was scared to eat as abdomen was so full. Intermittent nausea. Dry heaves. Feels like prostate is in the way with bowel movements. Feels like something is blocking from coming out. While watching football would drink a few mixed drinks each evening over past few years. Infrequent BMs. Sometimes looser stools.        Prior imaging: Nov 2022:US RUQ hepatic steatosis.  CT abd/pelvis WITHOUT Contrast Oct 2023: diffuse hepatic steatosis with questionable mildly nodular hepatic contours, scattered ascites, significant thickening of distal gastric antrum/pylorus. SPLEEN NORMAL in appearance.  [place] elastography liver Oct 2023: kPa 57.9.    Colonoscopy Nov 2023: rectal varices, one 6 mm polyp (tubular adenoma)   EGD nov 2023: gastritis s/p biopsy, portal gastropathy. Negative H.pylori.    Past Medical History:  Diagnosis Date   Abdominal distension    AICD (automatic cardioverter/defibrillator) present    Ankle edema, bilateral    1-2 +   CHF (congestive heart failure) ([place])    Hx of echocardiogram 11/09/1910   Showed an EF of 25% with no significant valve disease.   ICD (implantable cardioverter-defibrillator), single, in situ 08/26/2007   Nonischemic cardiomyopathy ([place])    Presence of permanent cardiac pacemaker     Past Surgical History:  Procedure Laterality Date   APPENDECTOMY     CARDIAC CATHETERIZATION  2002   Normal coronary arteries   CARDIAC CATHETERIZATION N/A 05/04/2015   Procedure: Right/Left Heart Cath and Coronary Angiography;  Surgeon: Larey Dresser, MD;  Location: [place] [facility];  Service: Cardiovascular;  Laterality: N/A;  CARDIAC CATHETERIZATION N/A 05/08/2015   Procedure: Right Heart Cath;  Surgeon: Jolaine Artist, MD;  Location: [place] [facility];  Service: Cardiovascular;  Laterality: N/A;   CARDIAC CATHETERIZATION N/A 05/08/2015   Procedure: IABP Insertion;  Surgeon: Jolaine Artist, MD;  Location: [place] [facility]  [facility];  Service: Cardiovascular;  Laterality: N/A;   CARDIOVERSION N/A 05/18/2015   Procedure: CARDIOVERSION WITH TEE;  Surgeon: Larey Dresser, MD;  Location: [place];  Service: Cardiovascular;  Laterality: N/A;   CHOLECYSTECTOMY     HEART TRANSPLANT     INSERTION OF IMPLANTABLE LEFT VENTRICULAR ASSIST DEVICE N/A 05/10/2015   Procedure: INSERTION OF IMPLANTABLE LEFT VENTRICULAR ASSIST DEVICE;  Surgeon: Ivin Poot, MD;  Location: [place];  Service: Open Heart Surgery;  Laterality: N/A;  CIRC ARREST  NITRIC OXIDE   PACEMAKER INSERTION  08/26/2007   place by Dr. Alroy Dust at [facility]   removal of ICD     04/2016   TEE WITHOUT CARDIOVERSION N/A 05/10/2015   Procedure: TRANSESOPHAGEAL ECHOCARDIOGRAM (TEE);  Surgeon: Ivin Poot, MD;  Location: [place];  Service: Open Heart Surgery;  Laterality: N/A;    Current Outpatient Medications  Medication Sig Dispense Refill   ALPRAZolam (XANAX) 0.5 MG tablet Take 0.5 mg by mouth daily as needed for anxiety.     amLODipine (NORVASC) 5 MG tablet Take 5 mg by mouth daily.     aspirin EC 81 MG tablet Take 81 mg by mouth daily.     cetirizine (ZYRTEC) 10 MG tablet Take 10 mg by mouth daily.     fluticasone (FLONASE) 50 MCG/ACT nasal spray Place 2 sprays into both nostrils daily.      mycophenolate (MYFORTIC) 360 MG TBEC EC tablet Take 360 mg by mouth 2 (two) times daily.     olopatadine (PATADAY) 0.1 % ophthalmic solution Place 1 drop into both eyes daily as needed (Red or itching eyes).     ondansetron (ZOFRAN) 4 MG tablet Take 1 tablet (4 mg total) by mouth every 8 (eight) hours as needed for nausea or vomiting. 30 tablet 1   pantoprazole (PROTONIX) 40 MG tablet Take 40 mg by mouth 2 (two) times daily.      predniSONE (DELTASONE) 2.5 MG tablet Take 2.5 mg by mouth daily.     tacrolimus (PROGRAF) 1 MG capsule Take 2-3 mg by mouth See admin instructions. Take 3 mg in the morning  and 2 mg at night     tamsulosin (FLOMAX) 0.4 MG CAPS capsule Take  0.4 mg by mouth daily.     No current facility-administered medications for this visit.    Allergies as of 02/27/2022 - Review Complete 02/27/2022  Allergen Reaction Noted   Penicillins  03/05/2012   Propranolol Other (See Comments) 02/02/2022   Sulfa antibiotics  03/05/2012    Family History  Problem Relation Age of Onset   Hypertension Mother    Hypertension Father    Cancer - Prostate Maternal Grandfather     Social History   Socioeconomic History   Marital status: Married    Spouse name: Not on file   Number of children: Not on file   Years of education: Not on file   Highest education level: Not on file  Occupational History   Not on file  Tobacco Use   Smoking status: Former    Packs/day: 1.00    Years: 5.00    Total pack years: 5.00    Types: Cigarettes    Quit  date: 02/20/1998    Years since quitting: 24.0    Passive exposure: Current   Smokeless tobacco: Never  Vaping Use   Vaping Use: Never used  Substance and Sexual Activity   Alcohol use: Yes    Alcohol/week: 0.0 standard drinks of alcohol    Comment: has quit drinking x 1 1/2 months. He sporadically drinks   Drug use: No   Sexual activity: Yes  Other Topics Concern   Not on file  Social History Narrative   Not on file   Social Determinants of Health   Financial Resource Strain: Not on file  Food Insecurity: Not on file  Transportation Needs: Not on file  Physical Activity: Not on file  Stress: Not on file  Social Connections: Not on file  Intimate Partner Violence: Not on file     Review of Systems   As mentioned in HPI   Physical Exam   BP (!) 134/90 Comment: pt has not took his BP meds today  Pulse (!) 105   Temp (!) 97.4 F (36.3 C)   Ht 5' 11" (1.803 m)   Wt 154 lb 12.8 oz (70.2 kg)   BMI 21.59 kg/m  General:   Alert and oriented. Pleasant and cooperative. Chronically ill-appearing Head:  Normocephalic and atraumatic. Eyes:  Without icterus Abdomen:  +BS, soft,  non-tender and non-distended. No HSM noted. No guarding or rebound. No masses appreciated.  Rectal:  Deferred  Msk:  Symmetrical without gross deformities. Normal posture. Extremities:  Without edema. Neurologic:  Alert and  oriented x4;  grossly normal neurologically. Skin:  Intact without significant lesions or rashes. Psych:  Alert and cooperative. Normal mood and affect.   Assessment   Patrick Humphrey is a 56 y.o. male presenting today in follow-up with a history of AICD, CHF, CKD, NICM diagnosed in 2002 s/p AICD in 2009, s/p LVAD  in 9/83/3825 complicated by RV failure and afib/flutter, undergoing heart transplant in 2018, nonischemic cardiomyopathy.  On mycophenolate and tacrolimus. Followed by [facility] with history of heart transplant. Now with new finding of cirrhosis.  Decompensated cirrhosis: etiology unclear but suspect multifactorial in setting of likely congestive hepatopathy in past, ETOH use, hepatic steatosis. Serologies thus far unrevealing although ferritin elevated in the 1000 range. I suspect this is more of an inflammatory/acute phase reactant and not indicative of hemochromatosis but need to rule this out. Needs AFP. His recurrent ascites (2 taps thus far in Nov) are concerning. Need to rule out malignancy and underlying PVT. In light of chronic immunosuppression, concern for occult malignancy. He has been referred to the Liver Clinic in [place] but may be best served at [facility] where he received his heart transplant.   CKD: worsening renal function: Creatinine 3.09 in Oct 2023, previously 2.40 01/17/2022. Decreasing urinary output. Checking labs today. I discussed with patient that he may need to present to the ED after review of labs.        PLAN    AFP, INR, hemochromatosis labs, CBC, CMP Doppler ultrasound Keep upcoming Nephrology appt on Saturday Keep upcoming appt in [place] for Liver Clinic: may need referral to [place] instead to keep continuity of care PPI BID Zofran prn  nausea ABSOLUTE Avoidance of ETOH   Annitta Needs, PhD, ANP-BC [facility]   ADDENDUM 11/22: labs not available in epic yet, but I was able to access [facility] portal. Creatinine is now 4.94. I personally contacted patient and advised him to present to the ED. Due to complexity of  this case, anticipate may need transfer to [facility]. Discussed briefly with Dr. Jenetta Downer as well.  Annitta Needs, PhD, ANP-BC [facility]   I have reviewed the note and agree with the APP's assessment as described in this progress note  Patient presenting rapid progression of liver disease, as he is presenting signs of portal hypertension.   Etiology of liver cirrhosis unclear at the moment, possibly combination of congestive hepatopathy, chronic alcohol abuse, unclear if there is a component of hemochromatosis leading to this.  Case was reviewed today, has presented worsening kidney function while off diuretics.  Unclear if this is related to hepatorenal syndrome.  Patient was advised to go to the ER for further evaluation and management with albumin.  However, given the complexity of his disease, it is anticipated he may need further evaluation at transplant center.  Ideally, he should follow-up instead at [facility] as he has his heart transplant care already established there.  Maylon Peppers, MD Gastroenterology and Hepatology [facility]

## 2022-02-27 NOTE — Procedures (Signed)
PreOperative Dx: Ascites Postoperative Dx: Ascites Procedure:   US guided paracentesis Radiologist:  Thornton Papas Anesthesia:  10 ml of 1% lidocaine Specimen:  3.3 L of yellow ascitic fluid EBL:   < 1 ml Complications:  None

## 2022-02-27 NOTE — Patient Instructions (Signed)
Continue pantoprazole twice a day.  I have sent in Zofran for nausea to take every 8 hours as needed.   You may take Miralax as needed for constipation.   We have ordered a special imaging of your liver.  Please have blood work done at [facility].  Avoid alcohol completely.   Further recommendations to follow!  It was a pleasure to see you today. I want to create trusting relationships with patients to provide genuine, compassionate, and quality care. I value your feedback. If you receive a survey regarding your visit,  I greatly appreciate you taking time to fill this out.   Annitta Needs, PhD, ANP-BC [facility]

## 2022-02-28 ENCOUNTER — Encounter (HOSPITAL_COMMUNITY): Payer: Self-pay | Admitting: *Deleted

## 2022-02-28 ENCOUNTER — Other Ambulatory Visit: Payer: Self-pay

## 2022-02-28 ENCOUNTER — Inpatient Hospital Stay (HOSPITAL_COMMUNITY)
Admission: EM | Admit: 2022-02-28 | Discharge: 2022-03-02 | DRG: 682 | Disposition: A | Discharge status: Other Acute Inpatient Hospital | Payer: Medicare Other | Attending: Internal Medicine | Admitting: Internal Medicine

## 2022-02-28 DIAGNOSIS — N179 Acute kidney failure, unspecified: Principal | ICD-10-CM | POA: Diagnosis present

## 2022-02-28 DIAGNOSIS — N184 Chronic kidney disease, stage 4 (severe): Secondary | ICD-10-CM | POA: Diagnosis present

## 2022-02-28 DIAGNOSIS — R7989 Other specified abnormal findings of blood chemistry: Secondary | ICD-10-CM | POA: Diagnosis present

## 2022-02-28 DIAGNOSIS — Z87891 Personal history of nicotine dependence: Secondary | ICD-10-CM | POA: Diagnosis not present

## 2022-02-28 DIAGNOSIS — R3911 Hesitancy of micturition: Secondary | ICD-10-CM | POA: Diagnosis present

## 2022-02-28 DIAGNOSIS — E860 Dehydration: Secondary | ICD-10-CM | POA: Diagnosis present

## 2022-02-28 DIAGNOSIS — D84821 Immunodeficiency due to drugs: Secondary | ICD-10-CM | POA: Diagnosis present

## 2022-02-28 DIAGNOSIS — K7460 Unspecified cirrhosis of liver: Secondary | ICD-10-CM | POA: Diagnosis present

## 2022-02-28 DIAGNOSIS — I4892 Unspecified atrial flutter: Secondary | ICD-10-CM | POA: Diagnosis present

## 2022-02-28 DIAGNOSIS — Z79624 Long term (current) use of inhibitors of nucleotide synthesis: Secondary | ICD-10-CM

## 2022-02-28 DIAGNOSIS — K3189 Other diseases of stomach and duodenum: Secondary | ICD-10-CM | POA: Diagnosis present

## 2022-02-28 DIAGNOSIS — N401 Enlarged prostate with lower urinary tract symptoms: Secondary | ICD-10-CM | POA: Diagnosis present

## 2022-02-28 DIAGNOSIS — I129 Hypertensive chronic kidney disease with stage 1 through stage 4 chronic kidney disease, or unspecified chronic kidney disease: Secondary | ICD-10-CM | POA: Diagnosis present

## 2022-02-28 DIAGNOSIS — F419 Anxiety disorder, unspecified: Secondary | ICD-10-CM | POA: Diagnosis present

## 2022-02-28 DIAGNOSIS — I428 Other cardiomyopathies: Secondary | ICD-10-CM | POA: Diagnosis present

## 2022-02-28 DIAGNOSIS — K760 Fatty (change of) liver, not elsewhere classified: Secondary | ICD-10-CM | POA: Diagnosis present

## 2022-02-28 DIAGNOSIS — K767 Hepatorenal syndrome: Secondary | ICD-10-CM | POA: Diagnosis present

## 2022-02-28 DIAGNOSIS — I4891 Unspecified atrial fibrillation: Secondary | ICD-10-CM | POA: Diagnosis present

## 2022-02-28 DIAGNOSIS — Z888 Allergy status to other drugs, medicaments and biological substances status: Secondary | ICD-10-CM

## 2022-02-28 DIAGNOSIS — R748 Abnormal levels of other serum enzymes: Secondary | ICD-10-CM

## 2022-02-28 DIAGNOSIS — Z8249 Family history of ischemic heart disease and other diseases of the circulatory system: Secondary | ICD-10-CM | POA: Diagnosis not present

## 2022-02-28 DIAGNOSIS — Z7982 Long term (current) use of aspirin: Secondary | ICD-10-CM

## 2022-02-28 DIAGNOSIS — Z7952 Long term (current) use of systemic steroids: Secondary | ICD-10-CM

## 2022-02-28 DIAGNOSIS — I5040 Unspecified combined systolic (congestive) and diastolic (congestive) heart failure: Secondary | ICD-10-CM | POA: Diagnosis not present

## 2022-02-28 DIAGNOSIS — I452 Bifascicular block: Secondary | ICD-10-CM | POA: Diagnosis present

## 2022-02-28 DIAGNOSIS — E872 Acidosis, unspecified: Secondary | ICD-10-CM | POA: Diagnosis present

## 2022-02-28 DIAGNOSIS — K766 Portal hypertension: Secondary | ICD-10-CM | POA: Diagnosis present

## 2022-02-28 DIAGNOSIS — R188 Other ascites: Secondary | ICD-10-CM | POA: Diagnosis present

## 2022-02-28 DIAGNOSIS — Z79899 Other long term (current) drug therapy: Secondary | ICD-10-CM | POA: Diagnosis not present

## 2022-02-28 DIAGNOSIS — Z95810 Presence of automatic (implantable) cardiac defibrillator: Secondary | ICD-10-CM

## 2022-02-28 DIAGNOSIS — Z941 Heart transplant status: Secondary | ICD-10-CM

## 2022-02-28 DIAGNOSIS — N182 Chronic kidney disease, stage 2 (mild): Secondary | ICD-10-CM | POA: Diagnosis not present

## 2022-02-28 DIAGNOSIS — Z880 Allergy status to penicillin: Secondary | ICD-10-CM

## 2022-02-28 DIAGNOSIS — E875 Hyperkalemia: Secondary | ICD-10-CM | POA: Diagnosis present

## 2022-02-28 DIAGNOSIS — Z882 Allergy status to sulfonamides status: Secondary | ICD-10-CM

## 2022-02-28 DIAGNOSIS — D849 Immunodeficiency, unspecified: Secondary | ICD-10-CM | POA: Diagnosis present

## 2022-02-28 DIAGNOSIS — N1832 Chronic kidney disease, stage 3b: Secondary | ICD-10-CM | POA: Diagnosis not present

## 2022-02-28 DIAGNOSIS — K7290 Hepatic failure, unspecified without coma: Secondary | ICD-10-CM | POA: Diagnosis not present

## 2022-02-28 HISTORY — DX: Unspecified cirrhosis of liver: K74.60

## 2022-02-28 LAB — RAPID URINE DRUG SCREEN, HOSP PERFORMED
Amphetamines: NOT DETECTED
Barbiturates: NOT DETECTED
Benzodiazepines: POSITIVE — AB
Cocaine: NOT DETECTED
Opiates: NOT DETECTED
Tetrahydrocannabinol: NOT DETECTED

## 2022-02-28 LAB — CBC WITH DIFFERENTIAL/PLATELET
Abs Immature Granulocytes: 0.02 10*3/uL (ref 0.00–0.07)
Basophils Absolute: 0 10*3/uL (ref 0.0–0.1)
Basophils Relative: 1 %
Eosinophils Absolute: 0.1 10*3/uL (ref 0.0–0.5)
Eosinophils Relative: 1 %
HCT: 36.3 % — ABNORMAL LOW (ref 39.0–52.0)
Hemoglobin: 12.4 g/dL — ABNORMAL LOW (ref 13.0–17.0)
Immature Granulocytes: 0 %
Lymphocytes Relative: 15 %
Lymphs Abs: 0.8 10*3/uL (ref 0.7–4.0)
MCH: 34 pg (ref 26.0–34.0)
MCHC: 34.2 g/dL (ref 30.0–36.0)
MCV: 99.5 fL (ref 80.0–100.0)
Monocytes Absolute: 0.5 10*3/uL (ref 0.1–1.0)
Monocytes Relative: 8 %
Neutro Abs: 4.2 10*3/uL (ref 1.7–7.7)
Neutrophils Relative %: 75 %
Platelets: 150 10*3/uL (ref 150–400)
RBC: 3.65 MIL/uL — ABNORMAL LOW (ref 4.22–5.81)
RDW: 14.3 % (ref 11.5–15.5)
WBC: 5.6 10*3/uL (ref 4.0–10.5)
nRBC: 0 % (ref 0.0–0.2)

## 2022-02-28 LAB — BASIC METABOLIC PANEL
Anion gap: 10 (ref 5–15)
BUN: 64 mg/dL — ABNORMAL HIGH (ref 6–20)
CO2: 18 mmol/L — ABNORMAL LOW (ref 22–32)
Calcium: 8.5 mg/dL — ABNORMAL LOW (ref 8.9–10.3)
Chloride: 106 mmol/L (ref 98–111)
Creatinine, Ser: 6.55 mg/dL — ABNORMAL HIGH (ref 0.61–1.24)
GFR, Estimated: 9 mL/min — ABNORMAL LOW (ref >60)
Glucose, Bld: 143 mg/dL — ABNORMAL HIGH (ref 70–99)
Potassium: 4.4 mmol/L (ref 3.5–5.1)
Sodium: 134 mmol/L — ABNORMAL LOW (ref 135–145)

## 2022-02-28 LAB — URINALYSIS, ROUTINE W REFLEX MICROSCOPIC
Bacteria, UA: NONE SEEN
Bilirubin Urine: NEGATIVE
Glucose, UA: NEGATIVE mg/dL
Hgb urine dipstick: NEGATIVE
Ketones, ur: NEGATIVE mg/dL
Leukocytes,Ua: NEGATIVE
Nitrite: NEGATIVE
Protein, ur: 30 mg/dL — AB
Specific Gravity, Urine: 1.02 (ref 1.005–1.030)
pH: 5 (ref 5.0–8.0)

## 2022-02-28 LAB — HEPATIC FUNCTION PANEL
ALT: 33 U/L (ref 0–44)
AST: 51 U/L — ABNORMAL HIGH (ref 15–41)
Albumin: 3 g/dL — ABNORMAL LOW (ref 3.5–5.0)
Alkaline Phosphatase: 118 U/L (ref 38–126)
Bilirubin, Direct: 1 mg/dL — ABNORMAL HIGH (ref 0.0–0.2)
Indirect Bilirubin: 0.9 mg/dL (ref 0.3–0.9)
Total Bilirubin: 1.9 mg/dL — ABNORMAL HIGH (ref 0.3–1.2)
Total Protein: 6.1 g/dL — ABNORMAL LOW (ref 6.5–8.1)

## 2022-02-28 LAB — SODIUM, URINE, RANDOM: Sodium, Ur: <10 mmol/L

## 2022-02-28 LAB — CYTOLOGY - NON PAP

## 2022-02-28 MED ORDER — ALPRAZOLAM 0.5 MG PO TABS
0.5000 mg | ORAL_TABLET | Freq: Every day | ORAL | Status: DC | PRN
  Administered 2022-02-28: 0.5 mg via ORAL
  Filled 2022-02-28: qty 1

## 2022-02-28 MED ORDER — SODIUM CHLORIDE 0.9 % IV BOLUS
500.0000 mL | Freq: Once | INTRAVENOUS | Status: AC
  Administered 2022-02-28: 500 mL via INTRAVENOUS

## 2022-02-28 MED ORDER — HEPARIN SODIUM (PORCINE) 5000 UNIT/ML IJ SOLN
5000.0000 [IU] | Freq: Three times a day (TID) | INTRAMUSCULAR | Status: DC
  Administered 2022-03-01 – 2022-03-02 (×3): 5000 [IU] via SUBCUTANEOUS
  Filled 2022-02-28 (×3): qty 1

## 2022-02-28 MED ORDER — MYCOPHENOLATE SODIUM 180 MG PO TBEC
360.0000 mg | DELAYED_RELEASE_TABLET | Freq: Once | ORAL | Status: DC
  Filled 2022-02-28: qty 2

## 2022-02-28 MED ORDER — OLOPATADINE HCL 0.1 % OP SOLN: 1.0000 [drp] | Freq: Every day | OPHTHALMIC | Status: DC | PRN

## 2022-02-28 MED ORDER — TACROLIMUS 1 MG PO CAPS
3.0000 mg | ORAL_CAPSULE | Freq: Every day | ORAL | Status: DC
  Administered 2022-03-01 – 2022-03-02 (×2): 3 mg via ORAL
  Filled 2022-02-28 (×2): qty 3

## 2022-02-28 MED ORDER — TACROLIMUS 1 MG PO CAPS: 2.0000 mg | ORAL_CAPSULE | ORAL | Status: DC

## 2022-02-28 MED ORDER — PREDNISONE 2.5 MG PO TABS
2.5000 mg | ORAL_TABLET | Freq: Every day | ORAL | Status: DC
  Administered 2022-03-01 – 2022-03-02 (×2): 2.5 mg via ORAL
  Filled 2022-02-28 (×2): qty 1

## 2022-02-28 MED ORDER — MYCOPHENOLATE SODIUM 180 MG PO TBEC
360.0000 mg | DELAYED_RELEASE_TABLET | Freq: Two times a day (BID) | ORAL | Status: DC
  Administered 2022-03-01 – 2022-03-02 (×3): 360 mg via ORAL
  Filled 2022-02-28 (×3): qty 2

## 2022-02-28 MED ORDER — ASPIRIN 81 MG PO TBEC
81.0000 mg | DELAYED_RELEASE_TABLET | Freq: Every day | ORAL | Status: DC
  Administered 2022-03-01 – 2022-03-02 (×2): 81 mg via ORAL
  Filled 2022-02-28 (×2): qty 1

## 2022-02-28 MED ORDER — TACROLIMUS 1 MG PO CAPS
2.0000 mg | ORAL_CAPSULE | Freq: Every day | ORAL | Status: DC
  Administered 2022-02-28 – 2022-03-01 (×2): 2 mg via ORAL
  Filled 2022-02-28 (×2): qty 2

## 2022-02-28 MED ORDER — TAMSULOSIN HCL 0.4 MG PO CAPS
0.4000 mg | ORAL_CAPSULE | Freq: Every day | ORAL | Status: DC
  Administered 2022-03-01 – 2022-03-02 (×2): 0.4 mg via ORAL
  Filled 2022-02-28 (×2): qty 1

## 2022-02-28 MED ORDER — SODIUM CHLORIDE 0.9 % IV SOLN: INTRAVENOUS | Status: AC

## 2022-02-28 MED ORDER — PANTOPRAZOLE SODIUM 40 MG PO TBEC
40.0000 mg | DELAYED_RELEASE_TABLET | Freq: Two times a day (BID) | ORAL | Status: DC
  Administered 2022-02-28 – 2022-03-02 (×5): 40 mg via ORAL
  Filled 2022-02-28 (×5): qty 1

## 2022-02-28 NOTE — H&P (Addendum)
TRH H&P   Patient Demographics:    Patrick Humphrey, is a 56 y.o. male  MRN: [id number]   DOB - 1965/12/20  Admit Date - 02/28/2022  Outpatient Primary MD for the patient is Monico Blitz, MD  Referring MD/NP/PA: Dr Sabra Heck  Outpatient Specialists: cardiology transplant team at [facility](patient used to follow with Dr. Aundra Dubin before heart transplant surgery), GI dr   Jenetta Downer in [place], but follow-up has been arranged with hepatology team at [facility] in [place],  Patient coming from: Home, sent by GI for abnormal labs  Sent by GI for abnormal labs     HPI:    Patrick Humphrey  is a 56 y.o. male, with past medical history for heart transplant at [facility], on immunosuppressive therapy, AICD, CHF, CKD, nonischemic cardiomyopathy,diagnosed in 2002 s/p AICD in 2009, s/p LVAD  in 2/95/7473 complicated by RV failure and afib/flutter, undergoing heart transplant in 2018, nonischemic cardiomyopathy.  On mycophenolate and tacrolimus. Followed by [facility] with history of heart transplant.  -Patient recently with recurrent ascites, started following with GI, had paracentesis x 2 over the last 2 weeks, most recent was yesterday where he had 3.3 L were drained, his baseline creatinine is around 3, labs drawn yesterday at the GI office were noted with creatinine of 5, for which she was asked to come to ED for further evaluation, patient reports he has a follow-up scheduled with [facility] in [place] coming week, as well he had an appointment with Dr. Theador Hawthorne from renal in few days to evaluate for his renal failure, denies any nausea, vomiting, does report some mild symptoms of urinary hesitancy. - in ED labs significant for BUN of 64, creatinine of 6.55, calcium of 8.5, AST of 51, hemoglobin of 12.4, platelet of 150, given worsening renal function ED discussed with renal, who recommended admission to  [facility] for further management.     ROS:   A full 10 point Review of Systems was done, except as stated above, all other Review of Systems were negative.   With Past History of the following :    Past Medical History:  Diagnosis Date   Abdominal distension    AICD (automatic cardioverter/defibrillator) present    removed after heart transplant surgery   Ankle edema, bilateral    1-2 +   CHF (congestive heart failure) ([place])    before heart transplant surgery   Cirrhosis ([place])    Hx of echocardiogram 11/09/1910   Showed an EF of 25% with no significant valve disease.   ICD (implantable cardioverter-defibrillator), single, in situ 08/26/2007   removed after heart transplant surgery   Nonischemic cardiomyopathy [facility])    before heart transplant surgery      Past Surgical History:  Procedure Laterality Date   APPENDECTOMY     CARDIAC CATHETERIZATION  2002   Normal coronary arteries   CARDIAC CATHETERIZATION N/A 05/04/2015   Procedure:  Right/Left Heart Cath and Coronary Angiography;  Surgeon: Larey Dresser, MD;  Location: [place] [facility];  Service: Cardiovascular;  Laterality: N/A;   CARDIAC CATHETERIZATION N/A 05/08/2015   Procedure: Right Heart Cath;  Surgeon: Jolaine Artist, MD;  Location: [place] [facility];  Service: Cardiovascular;  Laterality: N/A;   CARDIAC CATHETERIZATION N/A 05/08/2015   Procedure: IABP Insertion;  Surgeon: Jolaine Artist, MD;  Location: [place] [facility];  Service: Cardiovascular;  Laterality: N/A;   CARDIOVERSION N/A 05/18/2015   Procedure: CARDIOVERSION WITH TEE;  Surgeon: Larey Dresser, MD;  Location: [place];  Service: Cardiovascular;  Laterality: N/A;   CHOLECYSTECTOMY     HEART TRANSPLANT     INSERTION OF IMPLANTABLE LEFT VENTRICULAR ASSIST DEVICE N/A 05/10/2015   Procedure: INSERTION OF IMPLANTABLE LEFT VENTRICULAR ASSIST DEVICE;  Surgeon: Ivin Poot, MD;  Location: [place];  Service: Open Heart Surgery;   Laterality: N/A;  CIRC ARREST  NITRIC OXIDE   PACEMAKER INSERTION  08/26/2007   place by Dr. Alroy Dust at [facility]   removal of ICD     04/2016   TEE WITHOUT CARDIOVERSION N/A 05/10/2015   Procedure: TRANSESOPHAGEAL ECHOCARDIOGRAM (TEE);  Surgeon: Ivin Poot, MD;  Location: [place];  Service: Open Heart Surgery;  Laterality: N/A;      Social History:     Social History   Tobacco Use   Smoking status: Former    Packs/day: 1.00    Years: 5.00    Total pack years: 5.00    Types: Cigarettes    Quit date: 02/20/1998    Years since quitting: 24.0    Passive exposure: Current   Smokeless tobacco: Never  Substance Use Topics   Alcohol use: Not Currently    Comment: has quit drinking x 1 1/2 months. He sporadically drinks        Family History :     Family History  Problem Relation Age of Onset   Hypertension Mother    Hypertension Father    Cancer - Prostate Maternal Grandfather       Home Medications:   Prior to Admission medications   Medication Sig Start Date End Date Taking? Authorizing Provider  ALPRAZolam Duanne Moron) 0.5 MG tablet Take 0.5 mg by mouth daily as needed for anxiety.   Yes [provider]  amLODipine (NORVASC) 5 MG tablet Take 5 mg by mouth daily.   Yes [provider]  aspirin EC 81 MG tablet Take 81 mg by mouth daily.   Yes [provider]  cetirizine (ZYRTEC) 10 MG tablet Take 10 mg by mouth daily.   Yes [provider]  fluticasone (FLONASE) 50 MCG/ACT nasal spray Place 2 sprays into both nostrils daily.    Yes [provider]  mycophenolate (MYFORTIC) 360 MG TBEC EC tablet Take 360 mg by mouth 2 (two) times daily.   Yes [provider]  olopatadine (PATADAY) 0.1 % ophthalmic solution Place 1 drop into both eyes daily as needed (Red or itching eyes).   Yes [provider]  ondansetron (ZOFRAN) 4 MG tablet Take 1 tablet (4 mg total) by mouth every 8 (eight) hours as needed for  nausea or vomiting. 02/27/22  Yes Annitta Needs, NP  pantoprazole (PROTONIX) 40 MG tablet Take 40 mg by mouth 2 (two) times daily.    Yes [provider]  predniSONE (DELTASONE) 2.5 MG tablet Take 2.5 mg by mouth daily. 11/10/21  Yes [provider]  tacrolimus (PROGRAF)  1 MG capsule Take 2-3 mg by mouth See admin instructions. Take 3 mg in the morning  and 2 mg at night   Yes [provider]  tamsulosin (FLOMAX) 0.4 MG CAPS capsule Take 0.4 mg by mouth daily.   Yes [provider]     Allergies:     Allergies  Allergen Reactions   Penicillins     Per patient he tolerated a form of PCN as an adult  Tolerates cephalexin  Has patient had a PCN reaction causing immediate rash, facial/tongue/throat swelling, SOB or lightheadedness with hypotension: Yes Has patient had a PCN reaction causing severe rash involving mucus membranes or skin necrosis: No Has patient had a PCN reaction that required hospitalization No Has patient had a PCN reaction occurring within the last 10 years: No If all of the above answers are "NO", then may proceed with Cep   Propranolol Other (See Comments)    Nightmares, Tired, Cold hand and feet, Insomnia, Fatigue, Diminished energy, Low Libido, anxiety, poor concentration and depression   Sulfa Antibiotics     GI upset     Physical Exam:   Vitals  Blood pressure 116/82, pulse 84, temperature 97.7 F (36.5 C), temperature source Oral, resp. rate 20, height _0  (1.803 m), weight 70.2 kg, SpO2 100 %.   1. General developed male, laying in bed, no apparent distress lying in bed in NAD,   2. Normal affect and insight, Not Suicidal or Homicidal, Awake Alert, Oriented X 3.  3. No F.N deficits, ALL C.Nerves Intact, Strength 5/5 all 4 extremities, Sensation intact all 4 extremities, Plantars down going.  4. Ears and Eyes appear Normal, Conjunctivae clear, PERRLA. Moist Oral Mucosa.  5. Supple Neck, No JVD, No cervical  lymphadenopathy appriciated, No Carotid Bruits.  6. Symmetrical Chest wall movement, Good air movement bilaterally, CTAB.  7. RRR, No Gallops, Rubs or Murmurs, No Parasternal Heave.  8. Positive Bowel Sounds, Abdomen Soft, No tenderness, No organomegaly appriciated,No rebound -guarding or rigidity.  Mild ascites  9.  No Cyanosis, Normal Skin Turgor, No Skin Rash or Bruise.  10. Good muscle tone,  joints appear normal , no effusions, Normal ROM.  11. No Palpable Lymph Nodes in Neck or Axillae     Data Review:    CBC Recent Labs  Lab 02/28/22 1730  WBC 5.6  HGB 12.4*  HCT 36.3*  PLT 150  MCV 99.5  MCH 34.0  MCHC 34.2  RDW 14.3  LYMPHSABS 0.8  MONOABS 0.5  EOSABS 0.1  BASOSABS 0.0   ------------------------------------------------------------------------------------------------------------------  Chemistries  Recent Labs  Lab 02/28/22 1730  NA 134*  K 4.4  CL 106  CO2 18*  GLUCOSE 143*  BUN 64*  CREATININE 6.55*  CALCIUM 8.5*   ------------------------------------------------------------------------------------------------------------------ estimated creatinine clearance is 12.4 mL/min (A) (by C-G formula based on SCr of 6.55 mg/dL (H)). ------------------------------------------------------------------------------------------------------------------ No results for input(s): "TSH", "T4TOTAL", "T3FREE", "THYROIDAB" in the last 72 hours.  Invalid input(s): "FREET3"  Coagulation profile No results for input(s): "INR", "PROTIME" in the last 168 hours. ------------------------------------------------------------------------------------------------------------------- No results for input(s): "DDIMER" in the last 72 hours. -------------------------------------------------------------------------------------------------------------------  Cardiac Enzymes No results for input(s): "CKMB", "TROPONINI", "MYOGLOBIN" in the last 168 hours.  Invalid input(s):  "CK" ------------------------------------------------------------------------------------------------------------------    Component Value Date/Time   BNP 245.9 (H) 06/06/2015 1201   BNP 1,800.1 (H) 03/09/2015 1105     ---------------------------------------------------------------------------------------------------------------  Urinalysis    Component Value Date/Time   COLORURINE AMBER (A) 02/28/2022 1750   APPEARANCEUR  HAZY (A) 02/28/2022 1750   LABSPEC 1.020 02/28/2022 1750   PHURINE 5.0 02/28/2022 1750   GLUCOSEU NEGATIVE 02/28/2022 1750   HGBUR NEGATIVE 02/28/2022 1750   BILIRUBINUR NEGATIVE 02/28/2022 1750   KETONESUR NEGATIVE 02/28/2022 1750   PROTEINUR 30 (A) 02/28/2022 1750   NITRITE NEGATIVE 02/28/2022 1750   LEUKOCYTESUR NEGATIVE 02/28/2022 1750    ----------------------------------------------------------------------------------------------------------------   Imaging Results:    US Paracentesis  Result Date: 02/27/2022 INDICATION: Ascites, history of heart transplant EXAM: ULTRASOUND GUIDED DIAGNOSTIC AND THERAPEUTIC PARACENTESIS MEDICATIONS: None. COMPLICATIONS: None immediate. PROCEDURE: Informed written consent was obtained from the patient after a discussion of the risks, benefits and alternatives to treatment. A timeout was performed prior to the initiation of the procedure. Initial ultrasound scanning demonstrates a large amount of ascites within the LEFT lower abdominal quadrant. The right lower abdomen was prepped and draped in the usual sterile fashion. 1% lidocaine was used for local anesthesia. Following this, a 19 gauge, 7-cm, Yueh catheter was introduced. An ultrasound image was saved for documentation purposes. The paracentesis was performed. The catheter was removed and a dressing was applied. The patient tolerated the procedure well without immediate post procedural complication. FINDINGS: A total of approximately 3.3 L of yellow colored ascitic fluid  was removed. Samples were sent to the laboratory as requested by the clinical team. IMPRESSION: Successful ultrasound-guided paracentesis yielding 3.3 liters of peritoneal fluid. Electronically Signed   By: Lavonia Dana M.D.   On: 02/27/2022 11:43    EKG:  Vent. rate 88 BPM PR interval 137 ms QRS duration 142 ms QT/QTcB 410/497 ms P-R-T axes -14 110 2 Sinus rhythm RBBB and LPFB   Assessment & Plan:    Principal Problem:   Acute renal failure (ARF) (HCC) Active Problems:   Nonischemic cardiomyopathy ([place])   ICD - Medtronic Maximo II VR 2009   Elevated liver enzymes   Hepatic cirrhosis ([place])   Heart transplant status ([place])   Immunosuppressed status ([place])   AKI on CKD stage IIIb -Plan creatinine around 0.5-2 earlier this year, it is 6.5 on admission -No clear etiology at this point, will obtain UA, urine sodium, renal ultrasound, avoid nephrotoxic medication. -Discussed with renal Dr. Hollie Salk, who recommended admission to [facility], avoid low blood pressure. -Await further recommendation from renal once patient gets to [facility]. -Bladder scan showing 400 cc, will insert Foley catheter  Addendum: Foley catheter were inserted, patient had total of 5 cc urine output, likely renal bladder scan which did show a 400 cc most likely related to ascites than actual urinary retention.  Hepatic  cirrhosis -Was seen by GI yesterday, felt to be multifactorial in the setting of hepatic steatosis, congestive hepatopathy from previous and ICM prior to his heart transplant, and remote history of significant alcohol use(this was before 5 years, currently no alcohol consumption) -He has been referred [facility] for possible transplant -Status post 2 paracentesis over the last 2 weeks -Extensive workup with GI as an outpatient as below  " first para on 02/16/22 with 2.8 liters removed, negative cell count. Repeat para today with 3.3 liters removed. Negative cell count.  Serologies: elevated ferritin at 1,000, Iron sats 38, AMA negative, IgG normal, IgA elevated at 690, IgM normal, ceruloplasmin 24.7, ASMA negative, ANA negative, alpha-1 antitrypsin normal, Hep B surface antigen negative, Hep C antibody negative. Needs A/B vaccinations. May 2023: AST 97, otherwise normal LFTs. Looking back in [place], he had elevated LFTs starting in 2022.  Tbili 2.3, Alk  Phos 177, AST 118, ALT 51 in Oct 2023. "  Status post heart transplant NICM AICD immunosuppressed status -Continue with home immunosuppressive regimen including Myfortic, prednisone and tacrolimus, will send tacrolimus level -Sugar is soft we will hold on amlodipine for now -Continue with aspirin   DVT Prophylaxis Heparin   AM Labs Ordered, also please review Full Orders  Family Communication: Admission, patients condition and plan of care including tests being ordered have been discussed with the patient and wife who indicate understanding and agree with the plan and Code Status.  Code Status Full  Likely DC to  home  Condition GUARDED    Consults called: D/w Dr Hollie Salk,     Admission status: inpatient     Time spent in minutes : 70 minutes   Phillips Climes M.D on 02/28/2022 at 8:00 PM   [facility] - Office  [phone]

## 2022-02-28 NOTE — ED Provider Notes (Signed)
[facility] EMERGENCY DEPARTMENT Provider Note   CSN: [id number] Arrival date & time: 02/28/22  1656     History  No chief complaint on file.   Patrick Humphrey is a 56 y.o. male.  HPI   This patient is a 56 year old male status post heart transplant on his immunosuppressive medications.  He also has a history of some progressive renal dysfunction and after being seen at the GI office yesterday had some blood drawn and was told to come into the hospital because of worsening kidney function.  His baseline creatinine based on prior labs was several months ago at 3.0.  It was reportedly above 5 though I do not have that lab work at this time.  The patient denies any other symptoms other than some urinary hesitancy which she has had for several months and some generalized weakness with occasional nausea.  None of that is terribly new.  He denies fevers or chills denies coughing or shortness of breath and has no chest pain.  Home Medications Prior to Admission medications   Medication Sig Start Date End Date Taking? Authorizing Provider  ALPRAZolam Duanne Moron) 0.5 MG tablet Take 0.5 mg by mouth daily as needed for anxiety.    [provider]  amLODipine (NORVASC) 5 MG tablet Take 5 mg by mouth daily.    [provider]  aspirin EC 81 MG tablet Take 81 mg by mouth daily.    [provider]  cetirizine (ZYRTEC) 10 MG tablet Take 10 mg by mouth daily.    [provider]  fluticasone (FLONASE) 50 MCG/ACT nasal spray Place 2 sprays into both nostrils daily.     [provider]  mycophenolate (MYFORTIC) 360 MG TBEC EC tablet Take 360 mg by mouth 2 (two) times daily.    [provider]  olopatadine (PATADAY) 0.1 % ophthalmic solution Place 1 drop into both eyes daily as needed (Red or itching eyes).    [provider]  ondansetron (ZOFRAN) 4 MG tablet Take 1 tablet (4 mg total) by mouth every 8 (eight) hours as needed for nausea or  vomiting. 02/27/22   Annitta Needs, NP  pantoprazole (PROTONIX) 40 MG tablet Take 40 mg by mouth 2 (two) times daily.     [provider]  predniSONE (DELTASONE) 2.5 MG tablet Take 2.5 mg by mouth daily. 11/10/21   [provider]  tacrolimus (PROGRAF) 1 MG capsule Take 2-3 mg by mouth See admin instructions. Take 3 mg in the morning  and 2 mg at night    [provider]  tamsulosin (FLOMAX) 0.4 MG CAPS capsule Take 0.4 mg by mouth daily.    [provider]      Allergies    Penicillins, Propranolol, and Sulfa antibiotics    Review of Systems   Review of Systems  All other systems reviewed and are negative.   Physical Exam Updated Vital Signs BP 107/81   Pulse 84   Temp 97.7 F (36.5 C) (Oral)   Resp 18   Ht 1.803 m ('5\' 11"'$ )   Wt 70.2 kg   SpO2 100%   BMI 21.59 kg/m  Physical Exam Vitals and nursing note reviewed.  Constitutional:      General: He is not in acute distress.    Appearance: He is well-developed.  HENT:     Head: Normocephalic and atraumatic.     Mouth/Throat:     Pharynx: No oropharyngeal exudate.  Eyes:     General: No  scleral icterus.       Right eye: No discharge.        Left eye: No discharge.     Conjunctiva/sclera: Conjunctivae normal.     Pupils: Pupils are equal, round, and reactive to light.  Neck:     Thyroid: No thyromegaly.     Vascular: No JVD.  Cardiovascular:     Rate and Rhythm: Normal rate and regular rhythm.     Heart sounds: Normal heart sounds. No murmur heard.    No friction rub. No gallop.  Pulmonary:     Effort: Pulmonary effort is normal. No respiratory distress.     Breath sounds: Normal breath sounds. No wheezing or rales.  Abdominal:     General: Bowel sounds are normal. There is no distension.     Palpations: Abdomen is soft. There is no mass.     Tenderness: There is no abdominal tenderness.  Musculoskeletal:        General: No tenderness. Normal range of motion.     Cervical back:  Normal range of motion and neck supple.     Right lower leg: No edema.     Left lower leg: No edema.  Lymphadenopathy:     Cervical: No cervical adenopathy.  Skin:    General: Skin is warm and dry.     Findings: No erythema or rash.  Neurological:     Mental Status: He is alert.     Coordination: Coordination normal.  Psychiatric:        Behavior: Behavior normal.     ED Results / Procedures / Treatments   Labs (all labs ordered are listed, but only abnormal results are displayed) Labs Reviewed  BASIC METABOLIC PANEL - Abnormal; Notable for the following components:      Result Value   Sodium 134 (*)    CO2 18 (*)    Glucose, Bld 143 (*)    BUN 64 (*)    Creatinine, Ser 6.55 (*)    Calcium 8.5 (*)    GFR, Estimated 9 (*)    All other components within normal limits  CBC WITH DIFFERENTIAL/PLATELET - Abnormal; Notable for the following components:   RBC 3.65 (*)    Hemoglobin 12.4 (*)    HCT 36.3 (*)    All other components within normal limits  RAPID URINE DRUG SCREEN, HOSP PERFORMED - Abnormal; Notable for the following components:   Benzodiazepines POSITIVE (*)    All other components within normal limits  URINE CULTURE  URINALYSIS, ROUTINE W REFLEX MICROSCOPIC    EKG EKG Interpretation  Date/Time:  Wednesday February 28 2022 17:47:53 EST Ventricular Rate:  88 PR Interval:  137 QRS Duration: 142 QT Interval:  410 QTC Calculation: 497 R Axis:   110 Text Interpretation: Sinus rhythm RBBB and LPFB similar to prior EKG in the past Confirmed by Noemi Chapel [phone]) on 02/28/2022 5:52:03 PM  Radiology US Paracentesis  Result Date: 02/27/2022 INDICATION: Ascites, history of heart transplant EXAM: ULTRASOUND GUIDED DIAGNOSTIC AND THERAPEUTIC PARACENTESIS MEDICATIONS: None. COMPLICATIONS: None immediate. PROCEDURE: Informed written consent was obtained from the patient after a discussion of the risks, benefits and alternatives to treatment. A timeout was performed  prior to the initiation of the procedure. Initial ultrasound scanning demonstrates a large amount of ascites within the LEFT lower abdominal quadrant. The right lower abdomen was prepped and draped in the usual sterile fashion. 1% lidocaine was used for local anesthesia. Following this, a 19 gauge, 7-cm, Yueh catheter was introduced.  An ultrasound image was saved for documentation purposes. The paracentesis was performed. The catheter was removed and a dressing was applied. The patient tolerated the procedure well without immediate post procedural complication. FINDINGS: A total of approximately 3.3 L of yellow colored ascitic fluid was removed. Samples were sent to the laboratory as requested by the clinical team. IMPRESSION: Successful ultrasound-guided paracentesis yielding 3.3 liters of peritoneal fluid. Electronically Signed   By: Lavonia Dana M.D.   On: 02/27/2022 11:43    Procedures .Critical Care  Performed by: Noemi Chapel, MD Authorized by: Noemi Chapel, MD   Critical care provider statement:    Critical care time (minutes):  45   Critical care time was exclusive of:  Separately billable procedures and treating other patients   Critical care was necessary to treat or prevent imminent or life-threatening deterioration of the following conditions:  Renal failure   Critical care was time spent personally by me on the following activities:  Development of treatment plan with patient or surrogate, discussions with consultants, evaluation of patient's response to treatment, examination of patient, obtaining history from patient or surrogate, review of old charts, re-evaluation of patient's condition, pulse oximetry, ordering and review of radiographic studies, ordering and review of laboratory studies and ordering and performing treatments and interventions   Care discussed with: admitting provider   Comments:           Medications Ordered in ED Medications  sodium chloride 0.9 % bolus 500  mL (0 mLs Intravenous Stopped 02/28/22 1825)    ED Course/ Medical Decision Making/ A&P                           Medical Decision Making Amount and/or Complexity of Data Reviewed Labs: ordered.  Risk Decision regarding hospitalization.   This patient presents to the ED for concern of progressive renal dysfunction, this involves an extensive number of treatment options, and is a complaint that carries with it a high risk of complications and morbidity.  The differential diagnosis includes urinary retention, urinary infection, worsening kidney function, could be related to medications   Co morbidities that complicate the patient evaluation  Prior heart transplant on immunosuppressive therapy   Additional history obtained:  Additional history obtained from electronic medical record External records from outside source obtained and reviewed including prior labs from admission to the hospital   Lab Tests:  I Ordered, and personally interpreted labs.  The pertinent results include:      Cardiac Monitoring: / EKG:  The patient was maintained on a cardiac monitor.  I personally viewed and interpreted the cardiac monitored which showed an underlying rhythm of: Normal sinus rhythm   Consultations Obtained:  I requested consultation with the Nephrologist - Dr. Hollie Salk,  and discussed lab and imaging findings as well as pertinent plan - they recommend: admit to [facility] - cardiology available - hospitalit to admit Discussed with Dr. Waldron Labs who will admit   Problem List / ED Course / Critical interventions / Medication management  This patient has acute renal failure, there is some chronic failure in the past however this is much worse, he also has possible hepatorenal syndrome, would also consider that this could be medication related given his antirejection medications  I IV fluids ordered I ordered medication including IV fluids for slight hypotension and renal  failure Reevaluation of the patient after these medicines showed that the patient critically ill with mild hypotension, I doubt that this is  sepsis, urinalysis pending at the time of admission Will need transfer to larger center for nephrology and cardiac evaluation given his complicated history I have reviewed the patients home medicines and have made adjustments as needed   Social Determinants of Health:  Liver failure, renal failure, cardiac transplant   Test / Admission - Considered:  Needs admission to higher level of care, critically ill         Final Clinical Impression(s) / ED Diagnoses Final diagnoses:  Acute renal failure, unspecified acute renal failure type [facility])    Rx / DC Orders ED Discharge Orders     None         Noemi Chapel, MD 02/28/22 1846

## 2022-02-28 NOTE — ED Triage Notes (Signed)
Pt c/o abnormal lab work. Pt reports his GI PA called him and said his Creatinine was almost 5.

## 2022-03-01 ENCOUNTER — Encounter (HOSPITAL_COMMUNITY): Payer: Self-pay | Admitting: Internal Medicine

## 2022-03-01 ENCOUNTER — Inpatient Hospital Stay (HOSPITAL_COMMUNITY): Payer: Medicare Other

## 2022-03-01 ENCOUNTER — Other Ambulatory Visit (HOSPITAL_COMMUNITY): Payer: Medicare Other

## 2022-03-01 ENCOUNTER — Other Ambulatory Visit: Payer: Self-pay

## 2022-03-01 DIAGNOSIS — R188 Other ascites: Secondary | ICD-10-CM

## 2022-03-01 DIAGNOSIS — R7989 Other specified abnormal findings of blood chemistry: Secondary | ICD-10-CM

## 2022-03-01 DIAGNOSIS — N1832 Chronic kidney disease, stage 3b: Secondary | ICD-10-CM

## 2022-03-01 DIAGNOSIS — K7290 Hepatic failure, unspecified without coma: Secondary | ICD-10-CM

## 2022-03-01 LAB — BASIC METABOLIC PANEL
Anion gap: 17 — ABNORMAL HIGH (ref 5–15)
BUN: 63 mg/dL — ABNORMAL HIGH (ref 6–20)
CO2: 17 mmol/L — ABNORMAL LOW (ref 22–32)
Calcium: 8.9 mg/dL (ref 8.9–10.3)
Chloride: 101 mmol/L (ref 98–111)
Creatinine, Ser: 6.39 mg/dL — ABNORMAL HIGH (ref 0.61–1.24)
GFR, Estimated: 9 mL/min — ABNORMAL LOW (ref >60)
Glucose, Bld: 120 mg/dL — ABNORMAL HIGH (ref 70–99)
Potassium: 5.2 mmol/L — ABNORMAL HIGH (ref 3.5–5.1)
Sodium: 135 mmol/L (ref 135–145)

## 2022-03-01 LAB — PROTIME-INR
INR: 1.2 (ref 0.8–1.2)
Prothrombin Time: 15.1 seconds (ref 11.4–15.2)

## 2022-03-01 LAB — URINE CULTURE: Culture: 40000 — AB

## 2022-03-01 LAB — CBC
HCT: 35 % — ABNORMAL LOW (ref 39.0–52.0)
Hemoglobin: 12.2 g/dL — ABNORMAL LOW (ref 13.0–17.0)
MCH: 34.6 pg — ABNORMAL HIGH (ref 26.0–34.0)
MCHC: 34.9 g/dL (ref 30.0–36.0)
MCV: 99.2 fL (ref 80.0–100.0)
Platelets: 126 10*3/uL — ABNORMAL LOW (ref 150–400)
RBC: 3.53 MIL/uL — ABNORMAL LOW (ref 4.22–5.81)
RDW: 14.3 % (ref 11.5–15.5)
WBC: 4.9 10*3/uL (ref 4.0–10.5)
nRBC: 0 % (ref 0.0–0.2)

## 2022-03-01 LAB — HIV ANTIBODY (ROUTINE TESTING W REFLEX): HIV Screen 4th Generation wRfx: NONREACTIVE

## 2022-03-01 MED ORDER — ORAL CARE MOUTH RINSE: 15.0000 mL | OROMUCOSAL | Status: DC | PRN

## 2022-03-01 MED ORDER — MIDODRINE HCL 5 MG PO TABS
5.0000 mg | ORAL_TABLET | Freq: Three times a day (TID) | ORAL | Status: DC
  Administered 2022-03-01 (×2): 5 mg via ORAL
  Filled 2022-03-01 (×3): qty 1

## 2022-03-01 MED ORDER — SODIUM BICARBONATE 650 MG PO TABS
650.0000 mg | ORAL_TABLET | Freq: Two times a day (BID) | ORAL | Status: DC
  Administered 2022-03-01 – 2022-03-02 (×3): 650 mg via ORAL
  Filled 2022-03-01 (×3): qty 1

## 2022-03-01 MED ORDER — OCTREOTIDE ACETATE 100 MCG/ML IJ SOLN
100.0000 ug | Freq: Three times a day (TID) | INTRAMUSCULAR | Status: DC
  Administered 2022-03-01 – 2022-03-02 (×3): 100 ug via SUBCUTANEOUS
  Filled 2022-03-01 (×6): qty 1

## 2022-03-01 MED ORDER — SODIUM CHLORIDE 0.9 % IV SOLN: INTRAVENOUS | Status: DC

## 2022-03-01 MED ORDER — CHLORHEXIDINE GLUCONATE CLOTH 2 % EX PADS
6.0000 | MEDICATED_PAD | Freq: Every day | CUTANEOUS | Status: DC
  Administered 2022-03-01: 6 via TOPICAL

## 2022-03-01 MED ORDER — ALBUMIN HUMAN 25 % IV SOLN
25.0000 g | Freq: Two times a day (BID) | INTRAVENOUS | Status: DC
  Administered 2022-03-01 – 2022-03-02 (×3): 25 g via INTRAVENOUS
  Filled 2022-03-01 (×3): qty 100

## 2022-03-01 MED ORDER — ALPRAZOLAM 0.5 MG PO TABS
0.5000 mg | ORAL_TABLET | Freq: Two times a day (BID) | ORAL | Status: DC | PRN
  Administered 2022-03-01 – 2022-03-02 (×3): 0.5 mg via ORAL
  Filled 2022-03-01 (×3): qty 1

## 2022-03-01 NOTE — Plan of Care (Signed)
  Problem: Education: Goal: Knowledge of General Education information will improve Description: Including pain rating scale, medication(s)/side effects and non-pharmacologic comfort measures Outcome: Progressing   Problem: Health Behavior/Discharge Planning: Goal: Ability to manage health-related needs will improve Outcome: Progressing   Problem: Clinical Measurements: Goal: Ability to maintain clinical measurements within normal limits will improve Outcome: Progressing Goal: Will remain free from infection Outcome: Progressing Goal: Diagnostic test results will improve Outcome: Progressing Goal: Respiratory complications will improve Outcome: Progressing Goal: Cardiovascular complication will be avoided Outcome: Progressing   Problem: Activity: Goal: Risk for activity intolerance will decrease Outcome: Progressing   Problem: Nutrition: Goal: Adequate nutrition will be maintained Outcome: Progressing   Problem: Coping: Goal: Level of anxiety will decrease Outcome: Progressing   Problem: Elimination: Goal: Will not experience complications related to bowel motility Outcome: Progressing Goal: Will not experience complications related to urinary retention Outcome: Progressing   Problem: Pain Managment: Goal: General experience of comfort will improve Outcome: Progressing   Problem: Safety: Goal: Ability to remain free from injury will improve Outcome: Progressing   Problem: Skin Integrity: Goal: Risk for impaired skin integrity will decrease Outcome: Progressing   Problem: Health Behavior/Discharge Planning: Goal: Ability to manage health-related needs will improve Outcome: Progressing   Problem: Clinical Measurements: Goal: Complications related to the disease process or treatment will be avoided or minimized Outcome: Progressing Goal: Dialysis access will remain free of complications Outcome: Progressing   Problem: Activity: Goal: Activity intolerance  will improve Outcome: Progressing   Problem: Fluid Volume: Goal: Fluid volume balance will be maintained or improved Outcome: Progressing   Problem: Nutritional: Goal: Ability to make appropriate dietary choices will improve Outcome: Progressing   Problem: Respiratory: Goal: Respiratory symptoms related to disease process will be avoided Outcome: Progressing   Problem: Self-Concept: Goal: Body image disturbance will be avoided or minimized Outcome: Progressing   Problem: Urinary Elimination: Goal: Progression of disease will be identified and treated Outcome: Progressing

## 2022-03-01 NOTE — Progress Notes (Signed)
New Admission Note:  Arrival Method: [facility] Mental Orientation: Alert and oriented x 4 Telemetry: Box 13 NSR Assessment: Completed Skin: Warm and dry IV: NSL  Pain: Denies Tubes: Foley Safety Measures: Safety Fall Prevention Plan initiated.  Admission: Completed 5 M  Orientation: Patient has been orientated to the room, unit and the staff. Welcome booklet given.  Family: Wife at bedside  Orders have been reviewed and implemented. Will continue to monitor the patient. Call light has been placed within reach and bed alarm has been activated.   Sima Matas BSN, RN  Phone Number: [phone]

## 2022-03-01 NOTE — Consult Note (Addendum)
Consultation Note   Referring Provider:  [facility] PCP: Monico Blitz, MD Primary Gastroenterologist: Dr. Jenetta Downer with [facility].   Reason for consultation: Query need for [facility] Day: 2  Assessment    Patient Profile:  56 yo male with a complicated medical history  not limited to heart transplant in 2017, recently diagnosed cirrhosis c/b portal hypertension with ascites,  CKD. Admitted yesterday with worsening renal function ( creatinine 3 >> 5)  # Decompensated cirrhosis (just recently diagnosed) with ascites. Unable to calculate MELD ( no recent INR). Etiology of cirrhosis probably multifactorial ( hx of steatosis, Etoh, heart failure, ? Hemochromatosis with elevated ferritin). Followed closely by [facility].   Has recently undergone to paracentesis x 2 ( no SBP). Not getting diuretics due to renal function. No history of encephalopathy. EGD earlier this months with findings of portal hypertensive gastropathy, no varices. Rectal varices on colonoscopy.  [place] screening. AFP ordered by [facility] but not obtained. Had non-contrast CT scan at [facility] on 01/17/22 no liver lesions but without contrast not ideal to evaluate for [facility] .  He has been referred to [facility], has appt in December.   # Acute on chronic CKD ( ? 4), concern for HRS II. Had paracentesis on 11/21 but only 3.3 L removed.  In ED his creatinine was 6.55, up from baseline of 2-2.5. Nephrology following. Getting octreotide midodrine and IV albumin. Currently no indication for HD.   # See PMH for additional medical problems  Plan   Echocardiogram pending. Had normal EF in June 2023.  Will discuss with Dr. Loletha Carrow who will see him later today but unlikely to be TIPS candidate right now due to renal failure.  Will obtain AFP and INR   HPI   Patrick Humphrey is a 56 y.o. male with a past medical history significant for  heart transplant in 2018, cirrhosis with portal HTN, CKD, colon polyps . See PMH for any additional medical problems.  Patient is followed by [facility], last seen there on 02/27/22. He has recently been diagnosed with cirrhosis (decompensated at the time) of unclear etiology but probably multifactorial ( hx of steatosis, heart failure, Etoh). He has been requiring LVPs. He has been referred to [facility]. [name] feels okay today. No abdominal pain.    Previous GI Evaluation    Per Marylee Floras office notes:  Serologies: elevated ferritin at 1,000, Iron sats 38, AMA negative, IgG normal, IgA elevated at 690, IgM normal, ceruloplasmin 24.7, ASMA negative, ANA negative, alpha-1 antitrypsin normal, Hep B surface antigen negative, Hep C antibody negative   Colonoscopy Nov 2023: rectal varices, one 6 mm polyp (tubular adenoma)    EGD nov 2023: gastritis s/p biopsy, portal gastropathy. Negative H.pylori.    Recent Labs and Imaging US RENAL  Result Date: 03/01/2022 CLINICAL DATA:  Acute kidney injury EXAM: RENAL / URINARY TRACT ULTRASOUND COMPLETE COMPARISON:  None Available. FINDINGS: Right Kidney: Renal measurements: 9.0 x 4.6 x 5.0 cm = volume: 109 mL. Echogenic renal parenchyma with increased conspicuity of the corticomedullary interface. No hydronephrosis. No solid lesion. Left Kidney: Renal measurements: 10.0 x 5.1 x 5.2 cm = volume: 138 mL. Increased echogenicity of the renal parenchyma. Small circumscribed anechoic  simple cyst in the lower pole measures 1.0 x 0.9 x 1.4 cm. No imaging follow-up recommended. No hydronephrosis. Bladder: Foley catheter in place.  The bladder is decompressed. Other: Enlarged and nodular liver.  Moderate ascites. IMPRESSION: 1. Hepatic cirrhosis with ascites. 2. Echogenic kidneys suggests underlying medical renal disease. 3. No evidence of hydronephrosis. 4. Decompressed bladder containing a Foley catheter. Electronically Signed   By: Jacqulynn Cadet M.D.   On: 03/01/2022 07:22   US Paracentesis  Result Date: 02/27/2022 INDICATION: Ascites, history of heart transplant EXAM: ULTRASOUND GUIDED DIAGNOSTIC AND THERAPEUTIC PARACENTESIS MEDICATIONS: None. COMPLICATIONS: None immediate. PROCEDURE: Informed written consent was obtained from the patient after a discussion of the risks, benefits and alternatives to treatment. A timeout was performed prior to the initiation of the procedure. Initial ultrasound scanning demonstrates a large amount of ascites within the LEFT lower abdominal quadrant. The right lower abdomen was prepped and draped in the usual sterile fashion. 1% lidocaine was used for local anesthesia. Following this, a 19 gauge, 7-cm, Yueh catheter was introduced. An ultrasound image was saved for documentation purposes. The paracentesis was performed. The catheter was removed and a dressing was applied. The patient tolerated the procedure well without immediate post procedural complication. FINDINGS: A total of approximately 3.3 L of yellow colored ascitic fluid was removed. Samples were sent to the laboratory as requested by the clinical team. IMPRESSION: Successful ultrasound-guided paracentesis yielding 3.3 liters of peritoneal fluid. Electronically Signed   By: Lavonia Dana M.D.   On: 02/27/2022 11:43   US Paracentesis  Result Date: 02/16/2022 INDICATION: Abdominal distension and pain, ascites EXAM: ULTRASOUND GUIDED DIAGNOSTIC AND THERAPEUTIC PARACENTESIS MEDICATIONS: None. COMPLICATIONS: None immediate. PROCEDURE: Informed written consent was obtained from the patient after a discussion of the risks, benefits and alternatives to treatment. A timeout was performed prior to the initiation of the procedure. Initial ultrasound scanning demonstrates a large amount of ascites within the right lower abdominal quadrant. The right lower abdomen was prepped and draped in the usual sterile fashion. 1% lidocaine was used for local anesthesia.  Following this, a 19 gauge, 7-cm, Yueh catheter was introduced. An ultrasound image was saved for documentation purposes. The paracentesis was performed. The catheter was removed and a dressing was applied. The patient tolerated the procedure well without immediate post procedural complication. FINDINGS: A total of approximately 2.8 L of yellow ascitic fluid was removed. Samples were sent to the laboratory as requested by the clinical team. IMPRESSION: Successful ultrasound-guided paracentesis yielding 2.8 liters of peritoneal fluid. Electronically Signed   By: Lavonia Dana M.D.   On: 02/16/2022 16:35   [place] ELASTOGRAPHY LIVER  Result Date: 02/02/2022 CLINICAL DATA:  Elevated LFTs EXAM: [place] ELASTOGRAPHY HEPATIC TECHNIQUE: Sonography of the liver was performed. In addition, ultrasound elastography evaluation of the liver was performed. A region of interest was placed within the right lobe of the liver. Following application of a compressive sonographic pulse, tissue compressibility was assessed. Multiple assessments were performed at the selected site. Median tissue compressibility was determined. Previously, hepatic stiffness was assessed by shear wave velocity. Based on recently published Society of Radiologists in Ultrasound consensus article, reporting is now recommended to be performed in the SI units of pressure (kiloPascals) representing hepatic stiffness/elasticity. The obtained result is compared to the published reference standards. (cACLD = compensated Advanced Chronic Liver Disease) COMPARISON:  CT abdomen and pelvis 01/17/2022 FINDINGS: Liver: Echogenic parenchyma, likely fatty infiltration though this can be seen with cirrhosis and certain infiltrative disorders. No definite hepatic  mass or nodularity. Small amount of perihepatic free fluid noted. Portal vein is patent on color Doppler imaging with normal direction of blood flow towards the liver. ULTRASOUND HEPATIC ELASTOGRAPHY Device: Siemens Helix  VTQ Patient position: Supine Transducer: 5C1 Number of measurements: 10 Hepatic segment:  8 Median kPa: 57.9 IQR: 10.6 IQR/Median kPa ratio: 0.18 Data quality:  Uncertain, see discussion in impression of report. Diagnostic category:  Uncertain The use of hepatic elastography is applicable to patients with viral hepatitis and non-alcoholic fatty liver disease. At this time, there is insufficient data for the referenced cut-off values and use in other causes of liver disease, including alcoholic liver disease. Patients, however, may be assessed by elastography and serve as their own reference standard/baseline. In patients with non-alcoholic liver disease, the values suggesting compensated advanced chronic liver disease (cACLD) may be lower, and patients may need additional testing with elasticity results of 7-9 kPa. Please note that abnormal hepatic elasticity and shear wave velocities may also be identified in clinical settings other than with hepatic fibrosis, such as: acute hepatitis, elevated right heart and central venous pressures including use of beta blockers, veno-occlusive disease (Budd-Chiari), infiltrative processes such as mastocytosis/amyloidosis/infiltrative tumor/lymphoma, extrahepatic cholestasis, with hyperemia in the post-prandial state, and with liver transplantation. Correlation with patient history, laboratory data, and clinical condition recommended. Diagnostic Categories: < or =5 kPa: high probability of being normal < or =9 kPa: in the absence of other known clinical signs, rules out cACLD >9 kPa and ?13 kPa: suggestive of cACLD, but needs further testing >13 kPa: highly suggestive of cACLD > or =17 kPa: highly suggestive of cACLD with an increased probability of clinically significant portal hypertension IMPRESSION: ULTRASOUND LIVER: Markedly echogenic hepatic parenchyma, corresponding to the diffuse hepatic steatosis identified by prior CT. Questionably micronodular liver contour, cannot  exclude cirrhosis. Mild perihepatic ascites. ULTRASOUND HEPATIC ELASTOGRAPHY: Median kPa:  57.9 Diagnostic category: Uncertain; the presence of ascites is a relative contraindication to elastography evaluation, and can preclude obtaining adequate tissue elastography results of the liver. The presence of a subtle micronodular contour of liver is suggestive of cirrhosis. In this setting, either repeat elastography following resolution of ascites or further assessment of the liver by MR imaging would be of benefit in further assessing the liver. Electronically Signed   By: Lavonia Dana M.D.   On: 02/02/2022 13:36    Labs:  Recent Labs    02/28/22 1730 03/01/22 0209  WBC 5.6 4.9  HGB 12.4* 12.2*  HCT 36.3* 35.0*  PLT 150 126*   Recent Labs    02/28/22 1730 03/01/22 0209  NA 134* 135  K 4.4 5.2*  CL 106 101  CO2 18* 17*  GLUCOSE 143* 120*  BUN 64* 63*  CREATININE 6.55* 6.39*  CALCIUM 8.5* 8.9   Recent Labs    02/28/22 1730  PROT 6.1*  ALBUMIN 3.0*  AST 51*  ALT 33  ALKPHOS 118  BILITOT 1.9*  BILIDIR 1.0*  IBILI 0.9   Ascitic fluid cytology negative for malignancy  No results for input(s): "HEPBSAG", "HCVAB", "HEPAIGM", "HEPBIGM" in the last 72 hours. No results for input(s): "LABPROT", "INR" in the last 72 hours.  Past Medical History:  Diagnosis Date   Abdominal distension    AICD (automatic cardioverter/defibrillator) present    removed after heart transplant surgery   Ankle edema, bilateral    1-2 +   CHF (congestive heart failure) ([place])    before heart transplant surgery   Cirrhosis ([place])    Hx of echocardiogram  11/09/1910   Showed an EF of 25% with no significant valve disease.   ICD (implantable cardioverter-defibrillator), single, in situ 08/26/2007   removed after heart transplant surgery   Nonischemic cardiomyopathy [facility])    before heart transplant surgery    Past Surgical History:  Procedure Laterality Date   APPENDECTOMY     CARDIAC CATHETERIZATION   2002   Normal coronary arteries   CARDIAC CATHETERIZATION N/A 05/04/2015   Procedure: Right/Left Heart Cath and Coronary Angiography;  Surgeon: Larey Dresser, MD;  Location: [place] [facility];  Service: Cardiovascular;  Laterality: N/A;   CARDIAC CATHETERIZATION N/A 05/08/2015   Procedure: Right Heart Cath;  Surgeon: Jolaine Artist, MD;  Location: [place] [facility];  Service: Cardiovascular;  Laterality: N/A;   CARDIAC CATHETERIZATION N/A 05/08/2015   Procedure: IABP Insertion;  Surgeon: Jolaine Artist, MD;  Location: [place] [facility];  Service: Cardiovascular;  Laterality: N/A;   CARDIOVERSION N/A 05/18/2015   Procedure: CARDIOVERSION WITH TEE;  Surgeon: Larey Dresser, MD;  Location: [place];  Service: Cardiovascular;  Laterality: N/A;   CHOLECYSTECTOMY     HEART TRANSPLANT     INSERTION OF IMPLANTABLE LEFT VENTRICULAR ASSIST DEVICE N/A 05/10/2015   Procedure: INSERTION OF IMPLANTABLE LEFT VENTRICULAR ASSIST DEVICE;  Surgeon: Ivin Poot, MD;  Location: [place];  Service: Open Heart Surgery;  Laterality: N/A;  CIRC ARREST  NITRIC OXIDE   PACEMAKER INSERTION  08/26/2007   place by Dr. Alroy Dust at [facility]   removal of ICD     04/2016   TEE WITHOUT CARDIOVERSION N/A 05/10/2015   Procedure: TRANSESOPHAGEAL ECHOCARDIOGRAM (TEE);  Surgeon: Ivin Poot, MD;  Location: [place];  Service: Open Heart Surgery;  Laterality: N/A;    Family History  Problem Relation Age of Onset   Hypertension Mother    Hypertension Father    Cancer - Prostate Maternal Grandfather     Prior to Admission medications   Medication Sig Start Date End Date Taking? Authorizing Provider  ALPRAZolam Duanne Moron) 0.5 MG tablet Take 0.5 mg by mouth daily as needed for anxiety.   Yes [provider]  amLODipine (NORVASC) 5 MG tablet Take 5 mg by mouth daily.   Yes [provider]  aspirin EC 81 MG tablet Take 81 mg by mouth daily.   Yes [provider]  cetirizine (ZYRTEC)  10 MG tablet Take 10 mg by mouth daily.   Yes [provider]  fluticasone (FLONASE) 50 MCG/ACT nasal spray Place 2 sprays into both nostrils daily.    Yes [provider]  mycophenolate (MYFORTIC) 360 MG TBEC EC tablet Take 360 mg by mouth 2 (two) times daily.   Yes [provider]  olopatadine (PATADAY) 0.1 % ophthalmic solution Place 1 drop into both eyes daily as needed (Red or itching eyes).   Yes [provider]  ondansetron (ZOFRAN) 4 MG tablet Take 1 tablet (4 mg total) by mouth every 8 (eight) hours as needed for nausea or vomiting. 02/27/22  Yes Annitta Needs, NP  pantoprazole (PROTONIX) 40 MG tablet Take 40 mg by mouth 2 (two) times daily.    Yes [provider]  predniSONE (DELTASONE) 2.5 MG tablet Take 2.5 mg by mouth daily. 11/10/21  Yes [provider]  tacrolimus (PROGRAF) 1 MG capsule Take 2-3 mg by mouth See admin instructions. Take 3 mg in the morning  and 2 mg at night   Yes [provider]  tamsulosin (FLOMAX) 0.4  MG CAPS capsule Take 0.4 mg by mouth daily.   Yes [provider]    Current Facility-Administered Medications  Medication Dose Route Frequency Provider Last Rate Last Admin   0.9 %  sodium chloride infusion   Intravenous Continuous [place] Eric J, DO 50 mL/hr at 03/01/22 0734 New Bag at 03/01/22 0734   albumin human 25 % solution 25 g  25 g Intravenous BID Reesa Chew, MD       ALPRAZolam Duanne Moron) tablet 0.5 mg  0.5 mg Oral Daily PRN Elgergawy, Silver Huguenin, MD   0.5 mg at 02/28/22 2331   aspirin EC tablet 81 mg  81 mg Oral Daily Elgergawy, Silver Huguenin, MD       Chlorhexidine Gluconate Cloth 2 % PADS 6 each  6 each Topical Daily Elgergawy, Silver Huguenin, MD       heparin injection 5,000 Units  5,000 Units Subcutaneous Q8H Elgergawy, Silver Huguenin, MD       midodrine (PROAMATINE) tablet 5 mg  5 mg Oral TID WC Reesa Chew, MD       mycophenolate (MYFORTIC) EC tablet 360 mg  360 mg Oral BID Elgergawy,  Silver Huguenin, MD       mycophenolate (MYFORTIC) EC tablet 360 mg  360 mg Oral Once Elgergawy, Silver Huguenin, MD       octreotide (SANDOSTATIN) injection 100 mcg  100 mcg Subcutaneous Q8H Reesa Chew, MD       olopatadine (PATANOL) 0.1 % ophthalmic solution 1 drop  1 drop Both Eyes Daily PRN Elgergawy, Silver Huguenin, MD       Oral care mouth rinse  15 mL Mouth Rinse PRN Elgergawy, Silver Huguenin, MD       pantoprazole (PROTONIX) EC tablet 40 mg  40 mg Oral BID AC Elgergawy, Silver Huguenin, MD   40 mg at 03/01/22 0735   predniSONE (DELTASONE) tablet 2.5 mg  2.5 mg Oral Daily Elgergawy, Silver Huguenin, MD       sodium bicarbonate tablet 650 mg  650 mg Oral BID Reesa Chew, MD       tacrolimus (PROGRAF) capsule 3 mg  3 mg Oral Daily Elgergawy, Silver Huguenin, MD       And   tacrolimus (PROGRAF) capsule 2 mg  2 mg Oral QHS Elgergawy, Silver Huguenin, MD   2 mg at 02/28/22 2331   tamsulosin (FLOMAX) capsule 0.4 mg  0.4 mg Oral Daily Elgergawy, Silver Huguenin, MD        Allergies as of 02/28/2022 - Review Complete 02/28/2022  Allergen Reaction Noted   Penicillins  03/05/2012   Propranolol Other (See Comments) 02/02/2022   Sulfa antibiotics  03/05/2012    Social History   Socioeconomic History   Marital status: Married    Spouse name: Not on file   Number of children: Not on file   Years of education: Not on file   Highest education level: Not on file  Occupational History   Not on file  Tobacco Use   Smoking status: Former    Packs/day: 1.00    Years: 5.00    Total pack years: 5.00    Types: Cigarettes    Quit date: 02/20/1998    Years since quitting: 24.0    Passive exposure: Current   Smokeless tobacco: Never  Vaping Use   Vaping Use: Never used  Substance and Sexual Activity   Alcohol use: Not Currently    Comment: has quit drinking x 1 1/2 months. He sporadically drinks  Drug use: No   Sexual activity: Yes  Other Topics Concern   Not on file  Social History Narrative   Not on file   Social Determinants  of Health   Financial Resource Strain: Not on file  Food Insecurity: No Food Insecurity (03/01/2022)   Hunger Vital Sign    Worried About Running Out of Food in the Last Year: Never true    Ran Out of Food in the Last Year: Never true  Transportation Needs: No Transportation Needs (03/01/2022)   PRAPARE - Hydrologist (Medical): No    Lack of Transportation (Non-Medical): No  Physical Activity: Not on file  Stress: Not on file  Social Connections: Not on file  Intimate Partner Violence: Not At Risk (03/01/2022)   Humiliation, Afraid, Rape, and Kick questionnaire    Fear of Current or Ex-Partner: No    Emotionally Abused: No    Physically Abused: No    Sexually Abused: No    Review of Systems: All systems reviewed and negative except where noted in HPI.  Physical Exam: Vital signs in last 24 hours: Temp:  [97.7 F (36.5 C)-98 F (36.7 C)] 98 F (36.7 C) (11/23 0534) Pulse Rate:  [82-100] 82 (11/23 0534) Resp:  [14-20] 18 (11/23 0534) BP: (91-139)/(72-94) 139/94 (11/23 0534) SpO2:  [99 %-100 %] 100 % (11/23 0534) Weight:  [70.2 kg] 70.2 kg (11/22 1705) Last BM Date : 02/28/22 Wife present at the bedside for entire visit General:  Alert thin male in NAD Psych:  Pleasant, cooperative. Normal mood and affect Eyes: Pupils equal Ears:  Normal auditory acuity Nose: No deformity, discharge or lesions Neck:  Supple, no masses felt Lungs:  Clear to auscultation.  Heart:  Regular rate, regular rhythm. No lower extremity edema.  No elevated JVP, pulsatile liver or HJR Abdomen:  Soft, moderately distended, nontender, active bowel sounds, no masses felt Rectal :  Deferred Msk: Symmetrical without gross deformities.  Neurologic:  Alert, oriented, grossly normal neurologically.  Fluent speech Skin:  Intact without significant lesions.    Intake/Output from previous day: 11/22 0701 - 11/23 0700 In: 181.1 [I.V.:181.1] Out: 100  [Urine:100] Intake/Output this shift:  Total I/O In: 71.7 [I.V.:71.7] Out: -     Principal Problem:   Acute renal failure (ARF) (HCC) Active Problems:   Nonischemic cardiomyopathy ([place])   ICD - Medtronic Maximo II VR 2009   Elevated liver enzymes   Hepatic cirrhosis ([place])   Heart transplant status ([place])   Immunosuppressed status ([place])    Tye Savoy, NP-C @  03/01/2022, 9:53 AM  I have taken an interval history, thoroughly reviewed the chart and examined the patient. I agree with the Advanced Practitioner's note, impression and recommendations, and have recorded additional findings, impressions and recommendations below. I performed a substantive portion of this encounter (>50% time spent), including a complete performance of the medical decision making.  My additional thoughts are as follows:  56 year old medically complex man with pre-existing severe congestive heart failure ultimately leading to heart transplant who was recently diagnosed with decompensated cirrhosis of unknown cause. Autoimmune serologies negative, ferritin over thousand but with normal iron saturation and normal iron studies in 2017, doubt this is underlying hemochromatosis.  Genetic testing reportedly ordered but results still pending.  I agree his markedly elevated ferritin is most likely acute phase reactant. The cirrhosis causes suspected to be multifactorial, pre-existing heart failure likely cause congestive hepatopathy, possible fatty liver, some alcohol use.  Large volume ascites  with 2 recent therapeutic paracentesis of relatively modest volumes, but now with oliguric acute renal failure looking like type I hepatorenal syndrome.  Challenging scenario of volume management, and appreciate nephrologist's input and suggestion of TIPS consideration.  However, he cannot undergo TIPS placement at this juncture.  Not withstanding that an echocardiogram is pending to rule out right-sided heart failure, the  acute renal failure precludes TIPS placement since he would be at high risk of worsening renal failure.  He is receiving albumin, midodrine and octreotide in hopes that his renal function may improve in the coming days.  Regarding further testing, we have ordered a PT/INR, and alpha-fetoprotein and right upper quadrant ultrasound with Doppler studies to rule out venous thrombosis. No liver mass was seen on a noncontrast CT scan at an outside institution early last month, no mass noted on ultrasound elastography (which also had normal portal venous flow) last month.   Acute renal failure precludes any contrast studies at present.  After we have all his results and see how his renal function behaves in the coming days, we may need to communicate with [facility] to expedite their evaluation of him.  He is currently scheduled for clinic visit in mid December, but would likely need to be seen sooner or perhaps transferred for inpatient evaluation if his condition continues to worsen.  I do not yet know whether this patient would be considered a candidate for liver transplantation.  Thank you for involving [place] in his care, and we will follow him. (Highly complex medical decision making) Nelida Meuse III Office:[phone]

## 2022-03-01 NOTE — Progress Notes (Signed)
PROGRESS NOTE    Patrick Humphrey  JME:[id number] DOB: 02-03-1966 DOA: 02/28/2022 PCP: Monico Blitz, MD    Brief Narrative:   Patrick Humphrey is a 56 y.o. male with past medical history significant for nonischemic cardiomyopathy s/p AICD, (Dx 2002), LVAD (2297, complicated by RV failure, A-fib/flutter and ultimately underwent heart transplant 2018 on immunosuppression with mycophenolate and tacrolimus, recent diagnosis of cirrhosis with recurrent ascites in which she has received paracentesis x 2 over the last 2 weeks, chronic kidney disease stage IIIb who presented to [facility] ED on 11/22 for lab abnormality.  Patient's GI physician ordered labs and was noted to have a creatinine of 5 which is above his previous baseline of 2-2 0.5.  Patient also endorses decreased urinary output, generalized weakness.  In the ED, temperature 97.7 F, HR 100, RR 19, BP 99/72, SpO2 100% on room air.  WBC 5.6, hemoglobin 12.4, platelets 150.  Sodium 134, potassium 4.4, chloride 106, CO2 18, glucose 143, BUN 64, creatinine 6.55.  AST 51, ALT 33, total bilirubin 1.9.  Albumin 3.0.  Urinalysis unrevealing.  Urine sodium less than 10.  UDS positive for benzodiazepines.  Nephrology was consulted.  Foley catheter was placed with minimal urine output.  Patient was transferred to [facility] for further evaluation and management under the hospitalist service.  Assessment & Plan:   Acute renal failure on CKD stage IIIb Patient presenting to ED with elevated creatinine of 5 from outpatient labs.  Patient's baseline creatinine 2.0-2.5.  Patient also endorses decreased urinary output over the last several weeks, thought secondary to BPH.  Foley catheter was placed in the ED with minimal urine output.  Creatinine on admission 6.55.  Renal ultrasound with echogenic kidneys suggestive of underlying medical renal disease, no evidence of hydronephrosis and decompressed bladder with Foley catheter.  Nephrology was consulted and  given new onset cirrhosis, concern for hepatorenal syndrome. -- Nephrology following, appreciate assistance -- Cr 6.55>6.39 -- TTE: Pending -- Started on midodrine 5 mg PO TID and octreotide 100 mcg TID -- Albumin 25 g IV BID x 4 doses -- Avoid nephrotoxins, renal dose all medications -- Closely monitor urine output with Foley catheter in place -- BMP daily  Cirrhosis, decompensated with ascites Portal hypertension Steatosis noted on previous imaging.  Renal ultrasound with hepatic cirrhosis and moderate ascites.  Patient has been followed by GI in [place] with recent paracentesis x 2, last on 02/27/2022 with 3.3 L removed.  Nephrology concern for hepatorenal syndrome and requested GI consultation for consideration of TIPS; but due to renal failure unlikely to be a TIPS candidate per GI --[facility] following; appreciate assistance -- AFP/INR: Pending -- Fluid restriction 1200 mL/day  Nonischemic cardiomyopathy s/p heart transplant on immunosuppression Essential hypertension Patient follows with [facility] outpatient, Dr. Katy Apo; last seen on 08/25/2021.  Last TTE 01/20/2021 with LVEF greater than 55%. -- TTE: Pending -- Holding home amlodipine -- Continue mycophenolate 360 mg p.o. twice daily -- Continue tacrolimus 3 mg qAM and '2mg'$  qPM -- Continue prednisone 2.5 mg p.o. daily  BPH: Continue tamsulosin 0.4 mg p.o. daily  Anxiety --Alprazolam 0.5 mg p.o. twice daily as needed anxiety   DVT prophylaxis: heparin injection 5,000 Units Start: 03/01/22 2200    Code Status: Full Code Family Communication: Updated spouse present at bedside this morning  Disposition Plan:  Level of care: Telemetry Medical Status is: Inpatient Remains inpatient appropriate because: Needs further workup of acute renal failure likely secondary to hepatorenal syndrome.  Consultants:  Nephrology [facility]  Procedures:  TTE: Pending Renal ultrasound Right upper quadrant ultrasound:  Pending  Antimicrobials:  None   Subjective: Patient seen examined bedside, resting comfortably.  Lying in bed.  Spouse present.  No specific complaints this morning.  Reports that he has been having difficulty urinating at home, thought this was due to his prostate as he was having urinary hesitancy but poor urine output.  Discussed with him even with Foley catheter placement he has had minimal urine output since presenting to the hospital and this is highly concerning for worsening kidney function.  Seen by nephrology this morning and concern for hepatorenal syndrome and requesting GI involvement for consideration of TIPS procedure given his recent diagnosis of decompensated cirrhosis.  Nephrology also starting IV albumin, octreotide and midodrine.  Patient denies headache, no dizziness, no chest pain, no palpitations, no shortness of breath, no fever/chills/night sweats, no nausea/vomiting/diarrhea, no cough/congestion, no focal weakness, no paresthesias.  No acute events overnight per nursing staff.  Objective: Vitals:   03/01/22 0119 03/01/22 0119 03/01/22 0534 03/01/22 0903  BP:  (!) 123/90 (!) 139/94 109/79  Pulse:  88 82 91  Resp:  20 18   Temp:  97.7 F (36.5 C) 98 F (36.7 C) 98.1 F (36.7 C)  TempSrc:  Oral Oral Oral  SpO2:  100% 100% 100%  Weight:      Height: '5\' 11"'$  (1.803 m)       Intake/Output Summary (Last 24 hours) at 03/01/2022 1137 Last data filed at 03/01/2022 0900 Gross per 24 hour  Intake 492.79 ml  Output 250 ml  Net 242.79 ml   Filed Weights   02/28/22 1705  Weight: 70.2 kg    Examination:  Physical Exam: GEN: NAD, alert and oriented x 3, wd/wn HEENT: NCAT, PERRL, EOMI, sclera clear, MMM PULM: CTAB w/o wheezes/crackles, normal respiratory effort, on room air CV: RRR w/o M/G/R GI: abd soft, nontender to palpation, + distention, NABS, no R/G/M MSK: no peripheral edema, muscle strength globally intact 5/5 bilateral upper/lower extremities NEURO: CN  II-XII intact, no focal deficits, sensation to light touch intact PSYCH: normal mood/affect Integumentary: dry/intact, no rashes or wounds    Data Reviewed: I have personally reviewed following labs and imaging studies  CBC: Recent Labs  Lab 02/28/22 1730 03/01/22 0209  WBC 5.6 4.9  NEUTROABS 4.2  --   HGB 12.4* 12.2*  HCT 36.3* 35.0*  MCV 99.5 99.2  PLT 150 631*   Basic Metabolic Panel: Recent Labs  Lab 02/28/22 1730 03/01/22 0209  NA 134* 135  K 4.4 5.2*  CL 106 101  CO2 18* 17*  GLUCOSE 143* 120*  BUN 64* 63*  CREATININE 6.55* 6.39*  CALCIUM 8.5* 8.9   GFR: Estimated Creatinine Clearance: 12.7 mL/min (A) (by C-G formula based on SCr of 6.39 mg/dL (H)). Liver Function Tests: Recent Labs  Lab 02/28/22 1730  AST 51*  ALT 33  ALKPHOS 118  BILITOT 1.9*  PROT 6.1*  ALBUMIN 3.0*   No results for input(s): "LIPASE", "AMYLASE" in the last 168 hours. No results for input(s): "AMMONIA" in the last 168 hours. Coagulation Profile: No results for input(s): "INR", "PROTIME" in the last 168 hours. Cardiac Enzymes: No results for input(s): "CKTOTAL", "CKMB", "CKMBINDEX", "TROPONINI" in the last 168 hours. BNP (last 3 results) No results for input(s): "PROBNP" in the last 8760 hours. HbA1C: No results for input(s): "HGBA1C" in the last 72 hours. CBG: No results for input(s): "GLUCAP" in the last 168  hours. Lipid Profile: No results for input(s): "CHOL", "HDL", "LDLCALC", "TRIG", "CHOLHDL", "LDLDIRECT" in the last 72 hours. Thyroid Function Tests: No results for input(s): "TSH", "T4TOTAL", "FREET4", "T3FREE", "THYROIDAB" in the last 72 hours. Anemia Panel: No results for input(s): "VITAMINB12", "FOLATE", "FERRITIN", "TIBC", "IRON", "RETICCTPCT" in the last 72 hours. Sepsis Labs: No results for input(s): "PROCALCITON", "LATICACIDVEN" in the last 168 hours.  Recent Results (from the past 240 hour(s))  Gram stain     Status: None   Collection Time: 02/27/22  9:45 AM    Specimen: Ascitic  Result Value Ref Range Status   Specimen Description ASCITIC BOTTLES DRAWN AEROBIC AND ANAEROBIC  Final   Special Requests PERITONEAL 10CC  Final   Gram Stain   Final    NO ORGANISMS SEEN WBC PRESENT, PREDOMINANTLY MONONUCLEAR CYTOSPIN SMEAR Performed at [facility], [address] [place], [place] [address]    Report Status 02/27/2022 FINAL  Final  Culture, body fluid w Gram Stain-bottle     Status: None (Preliminary result)   Collection Time: 02/27/22  9:45 AM   Specimen: Ascitic  Result Value Ref Range Status   Specimen Description ASCITIC  Final   Special Requests 10CC  Final   Culture   Final    NO GROWTH 2 DAYS Performed at [facility], [address], [place], [place] [address]    Report Status PENDING  Incomplete         Radiology Studies: US RENAL  Result Date: 03/01/2022 CLINICAL DATA:  Acute kidney injury EXAM: RENAL / URINARY TRACT ULTRASOUND COMPLETE COMPARISON:  None Available. FINDINGS: Right Kidney: Renal measurements: 9.0 x 4.6 x 5.0 cm = volume: 109 mL. Echogenic renal parenchyma with increased conspicuity of the corticomedullary interface. No hydronephrosis. No solid lesion. Left Kidney: Renal measurements: 10.0 x 5.1 x 5.2 cm = volume: 138 mL. Increased echogenicity of the renal parenchyma. Small circumscribed anechoic simple cyst in the lower pole measures 1.0 x 0.9 x 1.4 cm. No imaging follow-up recommended. No hydronephrosis. Bladder: Foley catheter in place.  The bladder is decompressed. Other: Enlarged and nodular liver.  Moderate ascites. IMPRESSION: 1. Hepatic cirrhosis with ascites. 2. Echogenic kidneys suggests underlying medical renal disease. 3. No evidence of hydronephrosis. 4. Decompressed bladder containing a Foley catheter. Electronically Signed   By: Jacqulynn Cadet M.D.   On: 03/01/2022 07:22        Scheduled Meds:  aspirin EC  81 mg Oral Daily   Chlorhexidine Gluconate Cloth  6 each Topical Daily   heparin  5,000  Units Subcutaneous Q8H   midodrine  5 mg Oral TID WC   mycophenolate  360 mg Oral BID   mycophenolate  360 mg Oral Once   octreotide  100 mcg Subcutaneous Q8H   pantoprazole  40 mg Oral BID AC   predniSONE  2.5 mg Oral Daily   sodium bicarbonate  650 mg Oral BID   tacrolimus  3 mg Oral Daily   And   tacrolimus  2 mg Oral QHS   tamsulosin  0.4 mg Oral Daily   Continuous Infusions:  sodium chloride 50 mL/hr at 03/01/22 0734   albumin human 25 g (03/01/22 1028)     LOS: 1 day    Time spent: 56 minutes spent on chart review, discussion with nursing staff, consultants, updating family and interview/physical exam; more than 50% of that time was spent in counseling and/or coordination of care.    Mayme Profeta J [place] DO [facility] Available via Epic secure chat  7am-7pm After these hours, please refer to coverage provider listed on [email or web address] 03/01/2022, 11:37 AM

## 2022-03-01 NOTE — Plan of Care (Signed)
  Problem: Education: Goal: Knowledge of disease and its progression will improve Outcome: Completed/Met

## 2022-03-01 NOTE — Consult Note (Signed)
Nephrology Consult   Requesting provider: Eric [place] Service requesting consult: Hospitalist Reason for consult: AKI   Assessment/Recommendations: Patrick Humphrey is a/an 56 y.o. male with a past medical history CKD 3b/4, Heart transplant in 2018, cirrhosis  who present w/ AKI   AKI on CKD 3b: Possible CKD 4 with recent baseline of 2-2.5.I suspect that some of his progression has been related to cirrhosis with likely HRS type II.  Now I suspect that his AKI is related to HRS type I likely set off by paracentesis.  This would be supported by urine sodium of less than 10.  Dehydration possible but seems less likely to be the predominant cause.  Need to ensure that his heart is functioning well as this can present in a similar fashion but I do not expect he is undergoing heart rejection at this time.  Interestingly he does not fit the HRS subtype based on decent blood pressure and serum albumin but overall clinical picture points to this diagnosis -Start midodrine 5 mg TID and octreotide 124mg TID -IV albumin 25g twice daily x4 doses -Can give gentle IVFs -Recommend consulting hepatology; I think he may be a good candidate for TIPS. No history of HE -Obtain Echo -Continue to monitor daily Cr, Dose meds for GFR -Monitor Daily I/Os, Daily weight  -Maintain MAP>65 for optimal renal perfusion.  -Avoid nephrotoxic medications including NSAIDs -Use synthetic opioids (Fentanyl/Dilaudid) if needed -Foley in place -Currently no indication for HD  Cirrhosis: Multifactorial related to hepatic steatosis and congestive hepatopathy.  Following with GI in the outpatient setting.  Recommend involving hepatology today.  He may benefit from TIPS  Heart transplant: Continue home medications.  Follow-up tacrolimus level  Hyperkalemia: Mild with potassium of 5.2.  Continue to monitor  Metabolic acidosis: Likely associated with AKI.  Start sodium bicarbonate   Recommendations conveyed to primary  service.    [facility] 03/01/2022 9:08 AM   _____________________________________________________________________________________ CC: AKI  History of Present Illness: Patrick JILEKis a/an 56y.o. male with a past medical history of CKD 3b/4, Heart transplant in 2018, cirrhosis who presents with AKI.  The patient states that he has overall felt well and was surprised to hear that he needed to come into the emergency department.  He has not noticed fevers, chills, shortness of breath, chest pain, nausea, vomiting, diarrhea, decreased appetite.  He has been dealing with liver issues for the past several months.  He has required 2 paracentesis in the most recent 1 was several days ago.  Each time he has had about 3 L removed.  He has some longstanding CKD but this seems to have progressed over the past year with recent baseline of 2-2.5.  However, in the outpatient setting his creatinine was found to be 5 and it was recommended he come into the emergency department for further evaluation.  In the emergency department his creatinine was 6.5 [facility]  The patient states he has been drinking well without issues.  He does not take NSAIDs.  He has been taking his antirejection meds without issues.   Medications:  Current Facility-Administered Medications  Medication Dose Route Frequency Provider Last Rate Last Admin   0.9 %  sodium chloride infusion   Intravenous Continuous [place] Eric J, DO 50 mL/hr at 03/01/22 0734 New Bag at 03/01/22 0734   albumin human 25 % solution 25 g  25 g Intravenous BID PReesa Chew  MD       ALPRAZolam Duanne Moron) tablet 0.5 mg  0.5 mg Oral Daily PRN Elgergawy, Silver Huguenin, MD   0.5 mg at 02/28/22 2331   aspirin EC tablet 81 mg  81 mg Oral Daily Elgergawy, Silver Huguenin, MD       Chlorhexidine Gluconate Cloth 2 % PADS 6 each  6 each Topical Daily Elgergawy, Silver Huguenin, MD       heparin  injection 5,000 Units  5,000 Units Subcutaneous Q8H Elgergawy, Silver Huguenin, MD       mycophenolate (MYFORTIC) EC tablet 360 mg  360 mg Oral BID Elgergawy, Silver Huguenin, MD       mycophenolate (MYFORTIC) EC tablet 360 mg  360 mg Oral Once Elgergawy, Silver Huguenin, MD       olopatadine (PATANOL) 0.1 % ophthalmic solution 1 drop  1 drop Both Eyes Daily PRN Elgergawy, Silver Huguenin, MD       Oral care mouth rinse  15 mL Mouth Rinse PRN Elgergawy, Silver Huguenin, MD       pantoprazole (PROTONIX) EC tablet 40 mg  40 mg Oral BID AC Elgergawy, Silver Huguenin, MD   40 mg at 03/01/22 0735   predniSONE (DELTASONE) tablet 2.5 mg  2.5 mg Oral Daily Elgergawy, Silver Huguenin, MD       sodium bicarbonate tablet 650 mg  650 mg Oral BID Reesa Chew, MD       tacrolimus (PROGRAF) capsule 3 mg  3 mg Oral Daily Elgergawy, Silver Huguenin, MD       And   tacrolimus (PROGRAF) capsule 2 mg  2 mg Oral QHS Elgergawy, Silver Huguenin, MD   2 mg at 02/28/22 2331   tamsulosin (FLOMAX) capsule 0.4 mg  0.4 mg Oral Daily Elgergawy, Silver Huguenin, MD         ALLERGIES Penicillins, Propranolol, and Sulfa antibiotics  MEDICAL HISTORY Past Medical History:  Diagnosis Date   Abdominal distension    AICD (automatic cardioverter/defibrillator) present    removed after heart transplant surgery   Ankle edema, bilateral    1-2 +   CHF (congestive heart failure) ([place])    before heart transplant surgery   Cirrhosis ([place])    Hx of echocardiogram 11/09/1910   Showed an EF of 25% with no significant valve disease.   ICD (implantable cardioverter-defibrillator), single, in situ 08/26/2007   removed after heart transplant surgery   Nonischemic cardiomyopathy [facility])    before heart transplant surgery     SOCIAL HISTORY Social History   Socioeconomic History   Marital status: Married    Spouse name: Not on file   Number of children: Not on file   Years of education: Not on file   Highest education level: Not on file  Occupational History   Not on file  Tobacco Use    Smoking status: Former    Packs/day: 1.00    Years: 5.00    Total pack years: 5.00    Types: Cigarettes    Quit date: 02/20/1998    Years since quitting: 24.0    Passive exposure: Current   Smokeless tobacco: Never  Vaping Use   Vaping Use: Never used  Substance and Sexual Activity   Alcohol use: Not Currently    Comment: has quit drinking x 1 1/2 months. He sporadically drinks   Drug use: No   Sexual activity: Yes  Other Topics Concern   Not on file  Social History Narrative   Not on file   Social  Determinants of Health   Financial Resource Strain: Not on file  Food Insecurity: No Food Insecurity (03/01/2022)   Hunger Vital Sign    Worried About Running Out of Food in the Last Year: Never true    Ran Out of Food in the Last Year: Never true  Transportation Needs: No Transportation Needs (03/01/2022)   PRAPARE - Hydrologist (Medical): No    Lack of Transportation (Non-Medical): No  Physical Activity: Not on file  Stress: Not on file  Social Connections: Not on file  Intimate Partner Violence: Not At Risk (03/01/2022)   Humiliation, Afraid, Rape, and Kick questionnaire    Fear of Current or Ex-Partner: No    Emotionally Abused: No    Physically Abused: No    Sexually Abused: No     FAMILY HISTORY Family History  Problem Relation Age of Onset   Hypertension Mother    Hypertension Father    Cancer - Prostate Maternal Grandfather       Review of Systems: 12 systems reviewed Otherwise as per HPI, all other systems reviewed and negative  Physical Exam: Vitals:   03/01/22 0119 03/01/22 0534  BP: (!) 123/90 (!) 139/94  Pulse: 88 82  Resp: 20 18  Temp: 97.7 F (36.5 C) 98 F (36.7 C)  SpO2: 100% 100%   No intake/output data recorded.  Intake/Output Summary (Last 24 hours) at 03/01/2022 0908 Last data filed at 03/01/2022 0500 Gross per 24 hour  Intake 181.09 ml  Output 100 ml  Net 81.09 ml   General: well-appearing, no  acute distress HEENT: anicteric sclera, oropharynx clear without lesions CV: Normal rate, no rub, no lower extremity edema Lungs: clear to auscultation bilaterally, normal work of breathing Abd: soft, non-tender, minimal distention Skin: no visible lesions or rashes Psych: alert, engaged, appropriate mood and affect Musculoskeletal: no obvious deformities Neuro: normal speech, no gross focal deficits   Test Results Reviewed Lab Results  Component Value Date   NA 135 03/01/2022   K 5.2 (H) 03/01/2022   CL 101 03/01/2022   CO2 17 (L) 03/01/2022   BUN 63 (H) 03/01/2022   CREATININE 6.39 (H) 03/01/2022   CALCIUM 8.9 03/01/2022   ALBUMIN 3.0 (L) 02/28/2022   PHOS 2.8 05/13/2015    CBC Recent Labs  Lab 02/28/22 1730 03/01/22 0209  WBC 5.6 4.9  NEUTROABS 4.2  --   HGB 12.4* 12.2*  HCT 36.3* 35.0*  MCV 99.5 99.2  PLT 150 126*    I have reviewed all relevant outside healthcare records related to the patient's current hospitalization

## 2022-03-02 ENCOUNTER — Inpatient Hospital Stay (HOSPITAL_COMMUNITY): Payer: Medicare Other

## 2022-03-02 DIAGNOSIS — M109 Gout, unspecified: Secondary | ICD-10-CM | POA: Diagnosis not present

## 2022-03-02 DIAGNOSIS — I428 Other cardiomyopathies: Secondary | ICD-10-CM | POA: Diagnosis not present

## 2022-03-02 DIAGNOSIS — Z79899 Other long term (current) drug therapy: Secondary | ICD-10-CM | POA: Diagnosis not present

## 2022-03-02 DIAGNOSIS — R188 Other ascites: Secondary | ICD-10-CM | POA: Diagnosis not present

## 2022-03-02 DIAGNOSIS — Z79624 Long term (current) use of inhibitors of nucleotide synthesis: Secondary | ICD-10-CM | POA: Diagnosis not present

## 2022-03-02 DIAGNOSIS — N1832 Chronic kidney disease, stage 3b: Secondary | ICD-10-CM | POA: Diagnosis not present

## 2022-03-02 DIAGNOSIS — R918 Other nonspecific abnormal finding of lung field: Secondary | ICD-10-CM | POA: Diagnosis not present

## 2022-03-02 DIAGNOSIS — I10 Essential (primary) hypertension: Secondary | ICD-10-CM | POA: Diagnosis not present

## 2022-03-02 DIAGNOSIS — N182 Chronic kidney disease, stage 2 (mild): Secondary | ICD-10-CM

## 2022-03-02 DIAGNOSIS — Z7982 Long term (current) use of aspirin: Secondary | ICD-10-CM | POA: Diagnosis not present

## 2022-03-02 DIAGNOSIS — Z882 Allergy status to sulfonamides status: Secondary | ICD-10-CM | POA: Diagnosis not present

## 2022-03-02 DIAGNOSIS — K766 Portal hypertension: Secondary | ICD-10-CM | POA: Diagnosis not present

## 2022-03-02 DIAGNOSIS — N179 Acute kidney failure, unspecified: Secondary | ICD-10-CM | POA: Diagnosis not present

## 2022-03-02 DIAGNOSIS — Z87891 Personal history of nicotine dependence: Secondary | ICD-10-CM | POA: Diagnosis not present

## 2022-03-02 DIAGNOSIS — D84821 Immunodeficiency due to drugs: Secondary | ICD-10-CM | POA: Diagnosis not present

## 2022-03-02 DIAGNOSIS — I129 Hypertensive chronic kidney disease with stage 1 through stage 4 chronic kidney disease, or unspecified chronic kidney disease: Secondary | ICD-10-CM | POA: Diagnosis not present

## 2022-03-02 DIAGNOSIS — I5040 Unspecified combined systolic (congestive) and diastolic (congestive) heart failure: Secondary | ICD-10-CM

## 2022-03-02 DIAGNOSIS — N170 Acute kidney failure with tubular necrosis: Secondary | ICD-10-CM | POA: Diagnosis not present

## 2022-03-02 DIAGNOSIS — R338 Other retention of urine: Secondary | ICD-10-CM | POA: Diagnosis not present

## 2022-03-02 DIAGNOSIS — K7460 Unspecified cirrhosis of liver: Secondary | ICD-10-CM | POA: Diagnosis not present

## 2022-03-02 DIAGNOSIS — Z95810 Presence of automatic (implantable) cardiac defibrillator: Secondary | ICD-10-CM | POA: Diagnosis not present

## 2022-03-02 DIAGNOSIS — Z880 Allergy status to penicillin: Secondary | ICD-10-CM | POA: Diagnosis not present

## 2022-03-02 DIAGNOSIS — Z79621 Long term (current) use of calcineurin inhibitor: Secondary | ICD-10-CM | POA: Diagnosis not present

## 2022-03-02 DIAGNOSIS — Z941 Heart transplant status: Secondary | ICD-10-CM | POA: Diagnosis not present

## 2022-03-02 DIAGNOSIS — N189 Chronic kidney disease, unspecified: Secondary | ICD-10-CM | POA: Diagnosis not present

## 2022-03-02 DIAGNOSIS — F1090 Alcohol use, unspecified, uncomplicated: Secondary | ICD-10-CM | POA: Diagnosis not present

## 2022-03-02 LAB — COMPREHENSIVE METABOLIC PANEL
ALT: 28 U/L (ref 0–44)
AST: 51 U/L — ABNORMAL HIGH (ref 15–41)
Albumin: 3.4 g/dL — ABNORMAL LOW (ref 3.5–5.0)
Alkaline Phosphatase: 99 U/L (ref 38–126)
Anion gap: 15 (ref 5–15)
BUN: 63 mg/dL — ABNORMAL HIGH (ref 6–20)
CO2: 19 mmol/L — ABNORMAL LOW (ref 22–32)
Calcium: 9.2 mg/dL (ref 8.9–10.3)
Chloride: 105 mmol/L (ref 98–111)
Creatinine, Ser: 7.07 mg/dL — ABNORMAL HIGH (ref 0.61–1.24)
GFR, Estimated: 8 mL/min — ABNORMAL LOW (ref >60)
Glucose, Bld: 141 mg/dL — ABNORMAL HIGH (ref 70–99)
Potassium: 5.2 mmol/L — ABNORMAL HIGH (ref 3.5–5.1)
Sodium: 139 mmol/L (ref 135–145)
Total Bilirubin: 1.5 mg/dL — ABNORMAL HIGH (ref 0.3–1.2)
Total Protein: 6.3 g/dL — ABNORMAL LOW (ref 6.5–8.1)

## 2022-03-02 LAB — ECHOCARDIOGRAM COMPLETE
Area-P 1/2: 3.68 cm2
Calc EF: 78.4 %
Height: 71 in
S' Lateral: 2 cm
Single Plane A2C EF: 77.8 %
Single Plane A4C EF: 78.6 %
Weight: 2476.8 oz

## 2022-03-02 MED ORDER — SODIUM BICARBONATE 650 MG PO TABS: 650.0000 mg | ORAL_TABLET | Freq: Two times a day (BID) | ORAL | Status: DC

## 2022-03-02 MED ORDER — ALBUMIN HUMAN 25 % IV SOLN: 25.0000 g | Freq: Two times a day (BID) | INTRAVENOUS | Status: DC

## 2022-03-02 MED ORDER — ACETAMINOPHEN 500 MG PO TABS
500.0000 mg | ORAL_TABLET | Freq: Once | ORAL | Status: AC
  Administered 2022-03-02: 500 mg via ORAL
  Filled 2022-03-02: qty 1

## 2022-03-02 MED ORDER — MIDODRINE HCL 5 MG PO TABS: 5.0000 mg | ORAL_TABLET | Freq: Three times a day (TID) | ORAL | Status: DC

## 2022-03-02 MED ORDER — OCTREOTIDE ACETATE 100 MCG/ML IJ SOLN: 100.0000 ug | Freq: Three times a day (TID) | INTRAMUSCULAR | Status: DC

## 2022-03-02 NOTE — Plan of Care (Signed)
  Problem: Clinical Measurements: Goal: Respiratory complications will improve Outcome: Progressing Goal: Cardiovascular complication will be avoided Outcome: Progressing   Problem: Activity: Goal: Risk for activity intolerance will decrease Outcome: Progressing   Problem: Nutrition: Goal: Adequate nutrition will be maintained Outcome: Progressing   Problem: Coping: Goal: Level of anxiety will decrease Outcome: Progressing   Problem: Elimination: Goal: Will not experience complications related to urinary retention Outcome: Progressing

## 2022-03-02 NOTE — Progress Notes (Signed)
Nephrology Follow-Up Consult note   Assessment/Recommendations: Patrick Humphrey is a/an 56 y.o. male with a past medical history significant for CKD 3b/4, Heart transplant in 2018, cirrhosis  who present w/ AKI    AKI on CKD 3b: Possible CKD 4 with recent baseline of 2-2.5.I suspect that some of his progression has been related to cirrhosis with likely HRS type II.  Now I suspect that his AKI is related to HRS type I likely set off by paracentesis.  This would be supported by urine sodium of less than 10.  Dehydration possible but seems less likely to be the predominant cause.  Need to ensure that his heart is functioning well as this can present in a similar fashion but I do not expect he is undergoing heart rejection at this time.  Interestingly he does not fit the HRS subtype based on decent blood pressure and serum albumin but overall clinical picture points to this diagnosis -Unfortunately Crt continues to worsen. TIPS not felt to be best option by GI. Sometimes can be used as last resort in HRS but given contrast use it's fairly risky. Appreciate input from GI -Continue midodrine 5 mg TID and octreotide 172mg TID -IV albumin 25g twice daily x4 doses -Stop NS given ascites accumulation -Obtain Echo -Continue to monitor daily Cr, Dose meds for GFR -Monitor Daily I/Os, Daily weight  -Maintain MAP>65 for optimal renal perfusion.  -Avoid nephrotoxic medications including NSAIDs -Use synthetic opioids (Fentanyl/Dilaudid) if needed -Foley in place -Currently no indication for HD but we discussed today he may progress to the point of needing it   Cirrhosis: Multifactorial related to hepatic steatosis and congestive hepatopathy.  Following with GI in the outpatient setting.  Appreciate their input   Heart transplant: Continue home medications.  Follow-up tacrolimus level   Hyperkalemia: Mild with potassium of 5.2.  Continue to monitor   Metabolic acidosis: Likely associated with AKI.   Continue oral bicarb   Recommendations conveyed to primary service.    [facility] 03/02/2022 8:35 AM  ___________________________________________________________  CC: AKI  Interval History/Subjective: Patient states he has a lot of discomfort with his Foley catheter.  Has noticed worsening ascites.  Also has a headache.  Otherwise no complaints.  Denies nausea or vomiting.   Medications:  Current Facility-Administered Medications  Medication Dose Route Frequency Provider Last Rate Last Admin   0.9 %  sodium chloride infusion   Intravenous Continuous [place] J, DO 50 mL/hr at 03/01/22 2136 New Bag at 03/01/22 2136   albumin human 25 % solution 25 g  25 g Intravenous BID PReesa Chew MD 60 mL/hr at 03/01/22 2154 25 g at 03/01/22 2154   ALPRAZolam (XANAX) tablet 0.5 mg  0.5 mg Oral BID PRN [place] Eric J, DO   0.5 mg at 03/01/22 2147   aspirin EC tablet 81 mg  81 mg Oral Daily Elgergawy, DSilver Huguenin MD   81 mg at 03/01/22 1033   Chlorhexidine Gluconate Cloth 2 % PADS 6 each  6 each Topical Daily Elgergawy, DSilver Huguenin MD   6 each at 03/01/22 1037   heparin injection 5,000 Units  5,000 Units Subcutaneous Q8H Elgergawy, DSilver Huguenin MD   5,000 Units at 03/02/22 0640   midodrine (PROAMATINE) tablet 5 mg  5 mg Oral TID WC PReesa Chew MD   5 mg at 03/01/22 1642   mycophenolate (MYFORTIC) EC tablet 360 mg  360 mg Oral BID Elgergawy, DSilver Huguenin MD  360 mg at 03/01/22 2144   mycophenolate (MYFORTIC) EC tablet 360 mg  360 mg Oral Once Elgergawy, Silver Huguenin, MD       octreotide (SANDOSTATIN) injection 100 mcg  100 mcg Subcutaneous Q8H Reesa Chew, MD   100 mcg at 03/01/22 2149   olopatadine (PATANOL) 0.1 % ophthalmic solution 1 drop  1 drop Both Eyes Daily PRN Elgergawy, Silver Huguenin, MD       Oral care mouth rinse  15 mL Mouth Rinse PRN Elgergawy, Silver Huguenin, MD       pantoprazole (PROTONIX) EC tablet 40 mg  40 mg Oral BID AC Elgergawy, Silver Huguenin, MD    40 mg at 03/01/22 1642   predniSONE (DELTASONE) tablet 2.5 mg  2.5 mg Oral Daily Elgergawy, Silver Huguenin, MD   2.5 mg at 03/01/22 1034   sodium bicarbonate tablet 650 mg  650 mg Oral BID Reesa Chew, MD   650 mg at 03/01/22 2144   tacrolimus (PROGRAF) capsule 3 mg  3 mg Oral Daily Elgergawy, Silver Huguenin, MD   3 mg at 03/01/22 1034   And   tacrolimus (PROGRAF) capsule 2 mg  2 mg Oral QHS Elgergawy, Silver Huguenin, MD   2 mg at 03/01/22 2143   tamsulosin (FLOMAX) capsule 0.4 mg  0.4 mg Oral Daily Elgergawy, Silver Huguenin, MD   0.4 mg at 03/01/22 1033      Review of Systems: 10 systems reviewed and negative except per interval history/subjective  Physical Exam: Vitals:   03/01/22 2055 03/02/22 0525  BP: (!) 129/94 (!) 150/95  Pulse: 84 90  Resp: 18 18  Temp: 98.4 F (36.9 C) 98.5 F (36.9 C)  SpO2: 99% 100%   No intake/output data recorded.  Intake/Output Summary (Last 24 hours) at 03/02/2022 0835 Last data filed at 03/02/2022 0525 Gross per 24 hour  Intake 1850.17 ml  Output 800 ml  Net 1050.17 ml   Constitutional: well-appearing, no acute distress ENMT: ears and nose without scars or lesions, MMM CV: normal rate, no edema Respiratory: bilateral chest rise, normal work of breathing Gastrointestinal: soft, non-tender, moderate distention with apparent ascites Skin: no visible lesions or rashes Psych: alert, judgement/insight appropriate, appropriate mood and affect   Test Results I personally reviewed new and old clinical labs and radiology tests Lab Results  Component Value Date   NA 139 03/02/2022   K 5.2 (H) 03/02/2022   CL 105 03/02/2022   CO2 19 (L) 03/02/2022   BUN 63 (H) 03/02/2022   CREATININE 7.07 (H) 03/02/2022   CALCIUM 9.2 03/02/2022   ALBUMIN 3.4 (L) 03/02/2022   PHOS 2.8 05/13/2015    CBC Recent Labs  Lab 02/28/22 1730 03/01/22 0209  WBC 5.6 4.9  NEUTROABS 4.2  --   HGB 12.4* 12.2*  HCT 36.3* 35.0*  MCV 99.5 99.2  PLT 150 126*

## 2022-03-02 NOTE — Plan of Care (Addendum)
Pt has had minimum output this shift. Aileen Fass updated and orders obtained to flush catheter due to pt felt that he was leaking around foley. No improvements noted with creatinine that am. NS'@50'$  conitnues. Aileen Fass contacted due to octreotide due and bp is 150/95 map 110. MD ordered to hold am dose  Problem: Elimination: Goal: Will not experience complications related to urinary retention Outcome: Not Progressing   Problem: Fluid Volume: Goal: Fluid volume balance will be maintained or improved Outcome: Not Progressing   Problem: Urinary Elimination: Goal: Progression of disease will be identified and treated Outcome: Not Progressing   Problem: Education: Goal: Knowledge of General Education information will improve Description: Including pain rating scale, medication(s)/side effects and non-pharmacologic comfort measures Outcome: Progressing   Problem: Health Behavior/Discharge Planning: Goal: Ability to manage health-related needs will improve Outcome: Progressing   Problem: Clinical Measurements: Goal: Ability to maintain clinical measurements within normal limits will improve Outcome: Progressing Goal: Will remain free from infection Outcome: Progressing Goal: Diagnostic test results will improve Outcome: Progressing Goal: Respiratory complications will improve Outcome: Progressing Goal: Cardiovascular complication will be avoided Outcome: Progressing   Problem: Activity: Goal: Risk for activity intolerance will decrease Outcome: Progressing   Problem: Nutrition: Goal: Adequate nutrition will be maintained Outcome: Progressing   Problem: Coping: Goal: Level of anxiety will decrease Outcome: Progressing   Problem: Elimination: Goal: Will not experience complications related to bowel motility Outcome: Progressing   Problem: Pain Managment: Goal: General experience of comfort will improve Outcome: Progressing   Problem: Safety: Goal: Ability to remain free from  injury will improve Outcome: Progressing   Problem: Skin Integrity: Goal: Risk for impaired skin integrity will decrease Outcome: Progressing   Problem: Health Behavior/Discharge Planning: Goal: Ability to manage health-related needs will improve Outcome: Progressing   Problem: Clinical Measurements: Goal: Complications related to the disease process or treatment will be avoided or minimized Outcome: Progressing   Problem: Activity: Goal: Activity intolerance will improve Outcome: Progressing   Problem: Nutritional: Goal: Ability to make appropriate dietary choices will improve Outcome: Progressing   Problem: Respiratory: Goal: Respiratory symptoms related to disease process will be avoided Outcome: Progressing   Problem: Self-Concept: Goal: Body image disturbance will be avoided or minimized Outcome: Progressing

## 2022-03-02 NOTE — Progress Notes (Signed)
Pt discharged with belongings. [facility] informed by Gwenette Greet, RN. Pt escorted off the unit by [facility]. IV left in per [facility]'s request.

## 2022-03-02 NOTE — TOC Transition Note (Signed)
Transition of Care [facility]) - CM/SW Discharge Note   Patient Details  Name: Patrick Humphrey MRN: [id number] Date of Birth: 1965/12/19  [facility] CM/SW Contact:  Tom-Johnson, Renea Ee, RN Phone Number: 03/02/2022, 2:51 PM   Clinical Narrative:     Patient is scheduled for transfer to [facility] via [facility] today. EMTALA done by MD and RN to call report to [facility] and schedule transportation. Family notified and at bedside. No further TOC needs noted.   Final next level of care: Acute to Acute Transfer Barriers to Discharge: No Barriers Identified   Patient Goals and CMS Choice Patient states their goals for this hospitalization and ongoing recovery are:: Transfer To [facility]   Choice offered to / list presented to : NA  Discharge Placement                Patient to be transferred to facility by: [facility]      Discharge Plan and Services                DME Arranged: N/A DME Agency: NA       HH Arranged: NA HH Agency: NA        Social Determinants of Health (SDOH) Interventions Transportation Interventions: Intervention Not Indicated, Inpatient TOC   Readmission Risk Interventions     No data to display

## 2022-03-02 NOTE — Progress Notes (Signed)
   03/02/22 0207  Provider Notification  Provider Name/Title C. [place] Crane MD  Date Provider Notified 03/02/22  Time Provider Notified 0207  Method of Notification  (amion)  Notification Reason Other (Comment) (Pt feels like urine leaking around catheter. Informed MD only 75 output since shift started. questioned irrigate catheter.)  Provider response Other (Comment) (msg 2nd time. Infomred to flush catheter. order placed.)  Date of Provider Response 03/02/22  Time of Provider Response [phone]

## 2022-03-02 NOTE — Discharge Summary (Signed)
Physician Discharge Summary  Patrick Humphrey ZOX:[id number] DOB: 1966/01/27 DOA: 02/28/2022  PCP: Monico Blitz, MD  Admit date: 02/28/2022 Discharge date: 03/02/2022  Admitted From: Home/Transfer from [facility] Disposition:  Transfer to [facility]; setting physician Melvyn Novas, MD  Discharge Condition: Stable CODE STATUS: Full code  History of present illness:  Patrick Humphrey is a 56 y.o. male with past medical history significant for nonischemic cardiomyopathy s/p AICD, (Dx 2002), LVAD (8119, complicated by RV failure, A-fib/flutter and ultimately underwent heart transplant 2018 on immunosuppression with mycophenolate and tacrolimus, recent diagnosis of cirrhosis with recurrent ascites in which she has received paracentesis x 2 over the last 2 weeks, chronic kidney disease stage IIIb who presented to [facility] ED on 11/22 for lab abnormality.  Patient's GI physician ordered labs and was noted to have a creatinine of 5 which is above his previous baseline of 2-2 0.5.  Patient also endorses decreased urinary output, generalized weakness.   In the ED, temperature 97.7 F, HR 100, RR 19, BP 99/72, SpO2 100% on room air.  WBC 5.6, hemoglobin 12.4, platelets 150.  Sodium 134, potassium 4.4, chloride 106, CO2 18, glucose 143, BUN 64, creatinine 6.55.  AST 51, ALT 33, total bilirubin 1.9.  Albumin 3.0.  Urinalysis unrevealing.  Urine sodium less than 10.  UDS positive for benzodiazepines.  Nephrology was consulted.  Foley catheter was placed with minimal urine output.  Patient was transferred to [facility] for further evaluation and management under the hospitalist service.  Hospital course:  Acute renal failure on CKD stage IIIb Patient presenting to ED with elevated creatinine of 5 from outpatient labs.  Patient's baseline creatinine 2.0-2.5.  Patient also endorses decreased urinary output over the last several weeks, thought secondary to BPH.  Foley catheter  was placed in the ED with minimal urine output.  Creatinine on admission 6.55.  Renal ultrasound with echogenic kidneys suggestive of underlying medical renal disease, no evidence of hydronephrosis and decompressed bladder with Foley catheter.  Nephrology was consulted and given new onset cirrhosis, concern for hepatorenal syndrome.  Patient was started on midodrine 5 mg p.o. 3 times daily, octreotide 100 mcg 3 times daily and albumin 25 g IV twice daily x 4 doses.  TTE with preserved LVEF 70-75%.  Despite this, patient's creatinine continued to trend up to 7.07 at time of transfer.  Dr. Haroldine Laws with cardiology discussed with transplant team, Dr. Mosetta Pigeon at [facility] and requesting transfer to their facility so transplant team can follow him during his hospitalization.   Cirrhosis, decompensated with ascites Portal hypertension Steatosis noted on previous imaging.  Renal ultrasound with hepatic cirrhosis and moderate ascites.  Patient has been followed by GI in [place] with recent paracentesis x 2, last on 02/27/2022 with 3.3 L removed.  Ultrasound right upper quadrant with patent portal venous system with normal flow, noted hepatic cirrhosis and portal hypertension with splenomegaly and moderate/large volume ascites.  Nephrology concern for hepatorenal syndrome and requested GI consultation for consideration of TIPS; but due to renal failure not a TIPS candidate per GI.    Nonischemic cardiomyopathy s/p heart transplant on immunosuppression Essential hypertension Patient follows with [facility] outpatient, Dr. Katy Apo; last seen on 08/25/2021.  Last TTE 01/20/2021 with LVEF greater than 55%.  TTE 11/24 with LVEF 70-35%.  Home amlodipine was held during hospitalization.  Patient was continued on his immunosuppressive therapy with mycophenolate 360 mg p.o. twice daily, tacrolimus 3 mg qAM and '2mg'$   qPM, prednisone 2.5 mg p.o. daily.   BPH: Continue tamsulosin 0.4 mg p.o. daily    Anxiety Alprazolam 0.5 mg p.o. as needed anxiety  Discharge Diagnoses:  Principal Problem:   Acute renal failure superimposed on stage 3b chronic kidney disease ([place]) Active Problems:   Nonischemic cardiomyopathy ([place])   ICD - Medtronic Maximo II VR 2009   Elevated liver enzymes   LFT elevation   Decompensated hepatic cirrhosis ([place])   Heart transplant status ([place])   Immunosuppressed status ([place])   Other ascites    Discharge Instructions  Discharge Instructions     Diet - low sodium heart healthy   Complete by: As directed    Increase activity slowly   Complete by: As directed       Allergies as of 03/02/2022       Reactions   Penicillins    Per patient he tolerated a form of PCN as an adult Tolerates cephalexin Has patient had a PCN reaction causing immediate rash, facial/tongue/throat swelling, SOB or lightheadedness with hypotension: Yes Has patient had a PCN reaction causing severe rash involving mucus membranes or skin necrosis: No Has patient had a PCN reaction that required hospitalization No Has patient had a PCN reaction occurring within the last 10 years: No If all of the above answers are "NO", then may proceed with Cep   Propranolol Other (See Comments)   Nightmares, Tired, Cold hand and feet, Insomnia, Fatigue, Diminished energy, Low Libido, anxiety, poor concentration and depression   Sulfa Antibiotics    GI upset        Medication List     STOP taking these medications    amLODipine 5 MG tablet Commonly known as: NORVASC       TAKE these medications    albumin human 25 % bottle Inject 100 mLs (25 g total) into the vein 2 (two) times daily.   ALPRAZolam 0.5 MG tablet Commonly known as: XANAX Take 0.5 mg by mouth daily as needed for anxiety.   aspirin EC 81 MG tablet Take 81 mg by mouth daily.   cetirizine 10 MG tablet Commonly known as: ZYRTEC Take 10 mg by mouth daily.   fluticasone 50 MCG/ACT nasal spray Commonly known as:  FLONASE Place 2 sprays into both nostrils daily.   midodrine 5 MG tablet Commonly known as: PROAMATINE Take 1 tablet (5 mg total) by mouth 3 (three) times daily with meals.   mycophenolate 360 MG Tbec EC tablet Commonly known as: MYFORTIC Take 360 mg by mouth 2 (two) times daily.   octreotide 100 MCG/ML Soln injection Commonly known as: SANDOSTATIN Inject 1 mL (100 mcg total) into the skin every 8 (eight) hours.   ondansetron 4 MG tablet Commonly known as: ZOFRAN Take 1 tablet (4 mg total) by mouth every 8 (eight) hours as needed for nausea or vomiting.   pantoprazole 40 MG tablet Commonly known as: PROTONIX Take 40 mg by mouth 2 (two) times daily.   Pataday 0.1 % ophthalmic solution Generic drug: olopatadine Place 1 drop into both eyes daily as needed (Red or itching eyes).   predniSONE 2.5 MG tablet Commonly known as: DELTASONE Take 2.5 mg by mouth daily.   sodium bicarbonate 650 MG tablet Take 1 tablet (650 mg total) by mouth 2 (two) times daily.   tacrolimus 1 MG capsule Commonly known as: PROGRAF Take 2-3 mg by mouth See admin instructions. Take 3 mg in the morning  and 2 mg at night   tamsulosin 0.4  MG Caps capsule Commonly known as: FLOMAX Take 0.4 mg by mouth daily.        Allergies  Allergen Reactions   Penicillins     Per patient he tolerated a form of PCN as an adult  Tolerates cephalexin  Has patient had a PCN reaction causing immediate rash, facial/tongue/throat swelling, SOB or lightheadedness with hypotension: Yes Has patient had a PCN reaction causing severe rash involving mucus membranes or skin necrosis: No Has patient had a PCN reaction that required hospitalization No Has patient had a PCN reaction occurring within the last 10 years: No If all of the above answers are "NO", then may proceed with Cep   Propranolol Other (See Comments)    Nightmares, Tired, Cold hand and feet, Insomnia, Fatigue, Diminished energy, Low Libido, anxiety, poor  concentration and depression   Sulfa Antibiotics     GI upset    Consultations: Nephrology, Dr. Su Hilt GI, Dr. Loletha Carrow Discussed with cardiology, Dr. Haroldine Laws and [facility] cardiology, Dr. Mosetta Pigeon   Procedures/Studies: ECHOCARDIOGRAM COMPLETE  Result Date: 03/02/2022    ECHOCARDIOGRAM REPORT   Patient Name:   Patrick Humphrey Date of Exam: 03/02/2022 Medical Rec #:  [id number]          Height:       71.0 in Accession #:    [id number]         Weight:       154.8 lb Date of Birth:  Jun 03, 1965          BSA:          1.891 m Patient Age:    25 years           BP:           116/89 mmHg Patient Gender: M                  HR:           96 bpm. Exam Location:  Inpatient Procedure: 2D Echo, 3D Echo, Cardiac Doppler and Color Doppler Indications:    I50.40* Unspecified combined systolic (congestive) and diastolic                 (congestive) heart failure  History:        Patient has prior history of Echocardiogram examinations, most                 recent 06/22/2015. Cardiomyopathy, CHF and Cardiac shock,                 Abnormal ECG, Arrythmias:Atrial Fibrillation;                 Signs/Symptoms:Chest Pain. Patient had history of LVAD in 2017,                 and then heart transplant in 2018.  Sonographer:    Roseanna Rainbow RDCS Referring Phys: Shaune Pollack PEEPLES IMPRESSIONS  1. Patient had heart transplant in 2018 at [facility]. Marland Kitchen Left ventricular ejection fraction, by estimation, is 70 to 75%. The left ventricle has hyperdynamic function. The left ventricle has no regional wall motion abnormalities. There is mild concentric left  ventricular hypertrophy. Left ventricular diastolic parameters are indeterminate.  2. Right ventricular systolic function is normal. The right ventricular size is normal. Tricuspid regurgitation signal is inadequate for assessing PA pressure.  3. The mitral valve is normal in structure. No evidence of mitral valve regurgitation. No evidence of mitral stenosis.  4. The aortic valve  is tricuspid. Aortic valve regurgitation is not visualized. No aortic stenosis is present.  5. The inferior vena cava is normal in size with greater than 50% respiratory variability, suggesting right atrial pressure of 3 mmHg. Comparison(s): Function similar to prior stress test. FINDINGS  Left Ventricle: Patient had heart transplant in 2018 at [facility]. Left ventricular ejection fraction, by estimation, is 70 to 75%. The left ventricle has hyperdynamic function. The left ventricle has no regional wall motion abnormalities. The left ventricular internal cavity size was normal in size. There is mild concentric left ventricular hypertrophy. Left ventricular diastolic parameters are indeterminate. Right Ventricle: The right ventricular size is normal. No increase in right ventricular wall thickness. Right ventricular systolic function is normal. Tricuspid regurgitation signal is inadequate for assessing PA pressure. Left Atrium: Left atrial size was normal in size. Right Atrium: Right atrial size was normal in size. Pericardium: There is no evidence of pericardial effusion. Mitral Valve: The mitral valve is normal in structure. No evidence of mitral valve regurgitation. No evidence of mitral valve stenosis. Tricuspid Valve: The tricuspid valve is normal in structure. Tricuspid valve regurgitation is not demonstrated. No evidence of tricuspid stenosis. Aortic Valve: The aortic valve is tricuspid. Aortic valve regurgitation is not visualized. No aortic stenosis is present. Pulmonic Valve: The pulmonic valve was not well visualized. Pulmonic valve regurgitation is not visualized. No evidence of pulmonic stenosis. Aorta: The aortic root and ascending aorta are structurally normal, with no evidence of dilitation. Venous: The inferior vena cava is normal in size with greater than 50% respiratory variability, suggesting right atrial pressure of 3 mmHg. IAS/Shunts: No atrial level shunt detected by color flow Doppler.  LEFT  VENTRICLE PLAX 2D LVIDd:         4.20 cm     Diastology LVIDs:         2.00 cm     LV e' medial:    4.68 cm/s LV PW:         1.20 cm     LV E/e' medial:  12.4 LV IVS:        1.20 cm     LV e' lateral:   9.46 cm/s LVOT diam:     2.20 cm     LV E/e' lateral: 6.1 LV SV:         64 LV SV Index:   34 LVOT Area:     3.80 cm                             3D Volume EF: LV Volumes (MOD)           3D EF:        71 % LV vol d, MOD A2C: 47.2 ml LV EDV:       96 ml LV vol d, MOD A4C: 64.6 ml LV ESV:       28 ml LV vol s, MOD A2C: 10.5 ml LV SV:        68 ml LV vol s, MOD A4C: 13.8 ml LV SV MOD A2C:     36.7 ml LV SV MOD A4C:     64.6 ml LV SV MOD BP:      44.3 ml RIGHT VENTRICLE            IVC RV S prime:     4.21 cm/s  IVC diam: 0.80 cm TAPSE (M-mode): 0.8 cm LEFT ATRIUM  Index        RIGHT ATRIUM           Index LA diam:        4.00 cm 2.12 cm/m   RA Area:     14.10 cm LA Vol (A2C):   34.0 ml 17.98 ml/m  RA Volume:   27.00 ml  14.28 ml/m LA Vol (A4C):   25.9 ml 13.70 ml/m LA Biplane Vol: 29.0 ml 15.34 ml/m  AORTIC VALVE LVOT Vmax:   92.00 cm/s LVOT Vmean:  59.000 cm/s LVOT VTI:    0.168 m  AORTA Ao Root diam: 3.10 cm Ao Asc diam:  3.30 cm MITRAL VALVE MV Area (PHT): 3.68 cm    SHUNTS MV Decel Time: 206 msec    Systemic VTI:  0.17 m MV E velocity: 58.08 cm/s  Systemic Diam: 2.20 cm MV A velocity: 52.50 cm/s MV E/A ratio:  1.11 Rudean Haskell MD Electronically signed by Rudean Haskell MD Signature Date/Time: 03/02/2022/12:07:10 PM    Final    US ABDOMEN LIMITED WITH LIVER DOPPLER  Result Date: 03/01/2022 CLINICAL DATA:  Ascites in liver dysfunction. EXAM: DUPLEX ULTRASOUND OF LIVER TECHNIQUE: Color and duplex Doppler ultrasound was performed to evaluate the hepatic in-flow and out-flow vessels. COMPARISON:  Liver elastography performed on February 02, 2022. FINDINGS: Liver: Echogenic and heterogeneous parenchymal pattern in the liver. Evidence of surface nodularity and perihepatic ascites. Exam  compromised by patient body habitus and degree of steatosis and heterogeneity. No gross liver lesion is identified on this limited evaluation. Signs of moderate perihepatic ascites which appear to have increased since previous imaging. Main Portal Vein size: 1.0 cm Portal Vein Velocities Main Prox:  18.5 cm/sec Main Mid: 18.1 cm/sec Main Dist:  19.4 cm/sec Right: 14.5 cm/sec Left: 12.0 cm/sec Hepatic Vein Velocities Right:  20.2 cm/sec Middle:  40.7 cm/sec Left:  16.9 cm/sec IVC: IVC is patent with 23.6 centimeter/second flow. Hepatic Artery Velocity:  86.6 cm/sec Splenic Vein Velocity:  19.4 cm/sec Spleen: 14 x 13.3 x 5.6 (volume = 550)cm with a total volume of 550 cm^3 (411 cm^3 is upper limit normal) Portal Vein Occlusion/Thrombus: No Splenic Vein Occlusion/Thrombus: No Ascites: Moderate to large volume of ascites. Varices: Question of recanalized umbilical vein via LEFT portal vein. RIGHT pleural effusion. IMPRESSION: 1. Patent portal venous system with normal directional flow. 2. Patent hepatic venous system and patent hepatic arterial supply. 3. Hepatic cirrhosis and portal hypertension with splenomegaly and moderate to large volume of ascites. Question of early recanalized umbilical vein via LEFT portal vein. 4. RIGHT pleural effusion. Electronically Signed   By: Zetta Bills M.D.   On: 03/01/2022 18:41   US RENAL  Result Date: 03/01/2022 CLINICAL DATA:  Acute kidney injury EXAM: RENAL / URINARY TRACT ULTRASOUND COMPLETE COMPARISON:  None Available. FINDINGS: Right Kidney: Renal measurements: 9.0 x 4.6 x 5.0 cm = volume: 109 mL. Echogenic renal parenchyma with increased conspicuity of the corticomedullary interface. No hydronephrosis. No solid lesion. Left Kidney: Renal measurements: 10.0 x 5.1 x 5.2 cm = volume: 138 mL. Increased echogenicity of the renal parenchyma. Small circumscribed anechoic simple cyst in the lower pole measures 1.0 x 0.9 x 1.4 cm. No imaging follow-up recommended. No  hydronephrosis. Bladder: Foley catheter in place.  The bladder is decompressed. Other: Enlarged and nodular liver.  Moderate ascites. IMPRESSION: 1. Hepatic cirrhosis with ascites. 2. Echogenic kidneys suggests underlying medical renal disease. 3. No evidence of hydronephrosis. 4. Decompressed bladder containing a Foley catheter. Electronically Signed  By: Jacqulynn Cadet M.D.   On: 03/01/2022 07:22   US Paracentesis  Result Date: 02/27/2022 INDICATION: Ascites, history of heart transplant EXAM: ULTRASOUND GUIDED DIAGNOSTIC AND THERAPEUTIC PARACENTESIS MEDICATIONS: None. COMPLICATIONS: None immediate. PROCEDURE: Informed written consent was obtained from the patient after a discussion of the risks, benefits and alternatives to treatment. A timeout was performed prior to the initiation of the procedure. Initial ultrasound scanning demonstrates a large amount of ascites within the LEFT lower abdominal quadrant. The right lower abdomen was prepped and draped in the usual sterile fashion. 1% lidocaine was used for local anesthesia. Following this, a 19 gauge, 7-cm, Yueh catheter was introduced. An ultrasound image was saved for documentation purposes. The paracentesis was performed. The catheter was removed and a dressing was applied. The patient tolerated the procedure well without immediate post procedural complication. FINDINGS: A total of approximately 3.3 L of yellow colored ascitic fluid was removed. Samples were sent to the laboratory as requested by the clinical team. IMPRESSION: Successful ultrasound-guided paracentesis yielding 3.3 liters of peritoneal fluid. Electronically Signed   By: Lavonia Dana M.D.   On: 02/27/2022 11:43   US Paracentesis  Result Date: 02/16/2022 INDICATION: Abdominal distension and pain, ascites EXAM: ULTRASOUND GUIDED DIAGNOSTIC AND THERAPEUTIC PARACENTESIS MEDICATIONS: None. COMPLICATIONS: None immediate. PROCEDURE: Informed written consent was obtained from the patient  after a discussion of the risks, benefits and alternatives to treatment. A timeout was performed prior to the initiation of the procedure. Initial ultrasound scanning demonstrates a large amount of ascites within the right lower abdominal quadrant. The right lower abdomen was prepped and draped in the usual sterile fashion. 1% lidocaine was used for local anesthesia. Following this, a 19 gauge, 7-cm, Yueh catheter was introduced. An ultrasound image was saved for documentation purposes. The paracentesis was performed. The catheter was removed and a dressing was applied. The patient tolerated the procedure well without immediate post procedural complication. FINDINGS: A total of approximately 2.8 L of yellow ascitic fluid was removed. Samples were sent to the laboratory as requested by the clinical team. IMPRESSION: Successful ultrasound-guided paracentesis yielding 2.8 liters of peritoneal fluid. Electronically Signed   By: Lavonia Dana M.D.   On: 02/16/2022 16:35   [place] ELASTOGRAPHY LIVER  Result Date: 02/02/2022 CLINICAL DATA:  Elevated LFTs EXAM: [place] ELASTOGRAPHY HEPATIC TECHNIQUE: Sonography of the liver was performed. In addition, ultrasound elastography evaluation of the liver was performed. A region of interest was placed within the right lobe of the liver. Following application of a compressive sonographic pulse, tissue compressibility was assessed. Multiple assessments were performed at the selected site. Median tissue compressibility was determined. Previously, hepatic stiffness was assessed by shear wave velocity. Based on recently published Society of Radiologists in Ultrasound consensus article, reporting is now recommended to be performed in the SI units of pressure (kiloPascals) representing hepatic stiffness/elasticity. The obtained result is compared to the published reference standards. (cACLD = compensated Advanced Chronic Liver Disease) COMPARISON:  CT abdomen and pelvis 01/17/2022 FINDINGS:  Liver: Echogenic parenchyma, likely fatty infiltration though this can be seen with cirrhosis and certain infiltrative disorders. No definite hepatic mass or nodularity. Small amount of perihepatic free fluid noted. Portal vein is patent on color Doppler imaging with normal direction of blood flow towards the liver. ULTRASOUND HEPATIC ELASTOGRAPHY Device: Siemens Helix VTQ Patient position: Supine Transducer: 5C1 Number of measurements: 10 Hepatic segment:  8 Median kPa: 57.9 IQR: 10.6 IQR/Median kPa ratio: 0.18 Data quality:  Uncertain, see discussion in impression of report. Diagnostic  category:  Uncertain The use of hepatic elastography is applicable to patients with viral hepatitis and non-alcoholic fatty liver disease. At this time, there is insufficient data for the referenced cut-off values and use in other causes of liver disease, including alcoholic liver disease. Patients, however, may be assessed by elastography and serve as their own reference standard/baseline. In patients with non-alcoholic liver disease, the values suggesting compensated advanced chronic liver disease (cACLD) may be lower, and patients may need additional testing with elasticity results of 7-9 kPa. Please note that abnormal hepatic elasticity and shear wave velocities may also be identified in clinical settings other than with hepatic fibrosis, such as: acute hepatitis, elevated right heart and central venous pressures including use of beta blockers, veno-occlusive disease (Budd-Chiari), infiltrative processes such as mastocytosis/amyloidosis/infiltrative tumor/lymphoma, extrahepatic cholestasis, with hyperemia in the post-prandial state, and with liver transplantation. Correlation with patient history, laboratory data, and clinical condition recommended. Diagnostic Categories: < or =5 kPa: high probability of being normal < or =9 kPa: in the absence of other known clinical signs, rules out cACLD >9 kPa and ?13 kPa: suggestive of  cACLD, but needs further testing >13 kPa: highly suggestive of cACLD > or =17 kPa: highly suggestive of cACLD with an increased probability of clinically significant portal hypertension IMPRESSION: ULTRASOUND LIVER: Markedly echogenic hepatic parenchyma, corresponding to the diffuse hepatic steatosis identified by prior CT. Questionably micronodular liver contour, cannot exclude cirrhosis. Mild perihepatic ascites. ULTRASOUND HEPATIC ELASTOGRAPHY: Median kPa:  57.9 Diagnostic category: Uncertain; the presence of ascites is a relative contraindication to elastography evaluation, and can preclude obtaining adequate tissue elastography results of the liver. The presence of a subtle micronodular contour of liver is suggestive of cirrhosis. In this setting, either repeat elastography following resolution of ascites or further assessment of the liver by MR imaging would be of benefit in further assessing the liver. Electronically Signed   By: Lavonia Dana M.D.   On: 02/02/2022 13:36     Subjective: Patient seen examined bedside, resting comfortably.  Renal function continues to deteriorate despite midodrine, octreotide and IV albumin.  Discussed with cardiology, Dr. Haroldine Laws well as [facility] cardiology/transplant team Dr. Mosetta Pigeon who requested transfer to [facility] so that transplant team can follow him during this hospitalization.  Updated patient about requests of transfer.  No other questions or concerns at this time.  Denies headache, no chest pain, no palpitations, no shortness of breath, no abdominal pain, no fever/chills/night sweats, no nausea/vomiting/diarrhea, no focal weakness, no fatigue, no paresthesias.  No acute events overnight per nursing staff.  Discharge Exam: Vitals:   03/02/22 0525 03/02/22 1009  BP: (!) 150/95 116/89  Pulse: 90 94  Resp: 18   Temp: 98.5 F (36.9 C) 97.9 F (36.6 C)  SpO2: 100% 97%   Vitals:   03/01/22 1551 03/01/22 2055 03/02/22 0525 03/02/22 1009  BP: 124/89 (!)  129/94 (!) 150/95 116/89  Pulse:  84 90 94  Resp: '20 18 18   '$ Temp: 97.9 F (36.6 C) 98.4 F (36.9 C) 98.5 F (36.9 C) 97.9 F (36.6 C)  TempSrc: Oral  Oral Oral  SpO2: 99% 99% 100% 97%  Weight:      Height:        Physical Exam: GEN: NAD, alert and oriented x 3, wd/wn HEENT: NCAT, PERRL, EOMI, sclera clear, MMM PULM: CTAB w/o wheezes/crackles, normal respiratory effort, on room air CV: RRR w/o M/G/R GI: abd soft, nontender to palpation, + distention, NABS, no R/G/M MSK: no peripheral edema, muscle strength  globally intact 5/5 bilateral upper/lower extremities NEURO: CN II-XII intact, no focal deficits, sensation to light touch intact PSYCH: normal mood/affect Integumentary: dry/intact, no rashes or wounds    The results of significant diagnostics from this hospitalization (including imaging, microbiology, ancillary and laboratory) are listed below for reference.     Microbiology: Recent Results (from the past 240 hour(s))  Gram stain     Status: None   Collection Time: 02/27/22  9:45 AM   Specimen: Ascitic  Result Value Ref Range Status   Specimen Description ASCITIC BOTTLES DRAWN AEROBIC AND ANAEROBIC  Final   Special Requests PERITONEAL 10CC  Final   Gram Stain   Final    NO ORGANISMS SEEN WBC PRESENT, PREDOMINANTLY MONONUCLEAR CYTOSPIN SMEAR Performed at [facility], [address], [place], [place] [address]    Report Status 02/27/2022 FINAL  Final  Culture, body fluid w Gram Stain-bottle     Status: None (Preliminary result)   Collection Time: 02/27/22  9:45 AM   Specimen: Ascitic  Result Value Ref Range Status   Specimen Description ASCITIC  Final   Special Requests 10CC  Final   Culture   Final    NO GROWTH 3 DAYS Performed at [facility], [address], [place], [place] [address]    Report Status PENDING  Incomplete  Urine Culture     Status: Abnormal   Collection Time: 02/28/22  5:50 PM   Specimen: Urine, Clean Catch  Result Value Ref Range Status    Specimen Description URINE, CLEAN CATCH  Final   Special Requests NONE  Final   Culture (A)  Final    40,000 COLONIES/mL STREPTOCOCCUS AGALACTIAE TESTING AGAINST S. AGALACTIAE NOT ROUTINELY PERFORMED DUE TO PREDICTABILITY OF AMP/PEN/VAN SUSCEPTIBILITY.    Report Status 03/01/2022 FINAL  Final     Labs: BNP (last 3 results) No results for input(s): "BNP" in the last 8760 hours. Basic Metabolic Panel: Recent Labs  Lab 02/28/22 1730 03/01/22 0209 03/02/22 0320  NA 134* 135 139  K 4.4 5.2* 5.2*  CL 106 101 105  CO2 18* 17* 19*  GLUCOSE 143* 120* 141*  BUN 64* 63* 63*  CREATININE 6.55* 6.39* 7.07*  CALCIUM 8.5* 8.9 9.2   Liver Function Tests: Recent Labs  Lab 02/28/22 1730 03/02/22 0320  AST 51* 51*  ALT 33 28  ALKPHOS 118 99  BILITOT 1.9* 1.5*  PROT 6.1* 6.3*  ALBUMIN 3.0* 3.4*   No results for input(s): "LIPASE", "AMYLASE" in the last 168 hours. No results for input(s): "AMMONIA" in the last 168 hours. CBC: Recent Labs  Lab 02/28/22 1730 03/01/22 0209  WBC 5.6 4.9  NEUTROABS 4.2  --   HGB 12.4* 12.2*  HCT 36.3* 35.0*  MCV 99.5 99.2  PLT 150 126*   Cardiac Enzymes: No results for input(s): "CKTOTAL", "CKMB", "CKMBINDEX", "TROPONINI" in the last 168 hours. BNP: Invalid input(s): "POCBNP" CBG: No results for input(s): "GLUCAP" in the last 168 hours. D-Dimer No results for input(s): "DDIMER" in the last 72 hours. Hgb A1c No results for input(s): "HGBA1C" in the last 72 hours. Lipid Profile No results for input(s): "CHOL", "HDL", "LDLCALC", "TRIG", "CHOLHDL", "LDLDIRECT" in the last 72 hours. Thyroid function studies No results for input(s): "TSH", "T4TOTAL", "T3FREE", "THYROIDAB" in the last 72 hours.  Invalid input(s): "FREET3" Anemia work up No results for input(s): "VITAMINB12", "FOLATE", "FERRITIN", "TIBC", "IRON", "RETICCTPCT" in the last 72 hours. Urinalysis    Component Value Date/Time   COLORURINE AMBER (A) 02/28/2022 1750  APPEARANCEUR  HAZY (A) 02/28/2022 1750   LABSPEC 1.020 02/28/2022 1750   PHURINE 5.0 02/28/2022 1750   GLUCOSEU NEGATIVE 02/28/2022 1750   HGBUR NEGATIVE 02/28/2022 1750   BILIRUBINUR NEGATIVE 02/28/2022 1750   KETONESUR NEGATIVE 02/28/2022 1750   PROTEINUR 30 (A) 02/28/2022 1750   NITRITE NEGATIVE 02/28/2022 1750   LEUKOCYTESUR NEGATIVE 02/28/2022 1750   Sepsis Labs Recent Labs  Lab 02/28/22 1730 03/01/22 0209  WBC 5.6 4.9   Microbiology Recent Results (from the past 240 hour(s))  Gram stain     Status: None   Collection Time: 02/27/22  9:45 AM   Specimen: Ascitic  Result Value Ref Range Status   Specimen Description ASCITIC BOTTLES DRAWN AEROBIC AND ANAEROBIC  Final   Special Requests PERITONEAL 10CC  Final   Gram Stain   Final    NO ORGANISMS SEEN WBC PRESENT, PREDOMINANTLY MONONUCLEAR CYTOSPIN SMEAR Performed at [facility], [address] [place], [place] [address]    Report Status 02/27/2022 FINAL  Final  Culture, body fluid w Gram Stain-bottle     Status: None (Preliminary result)   Collection Time: 02/27/22  9:45 AM   Specimen: Ascitic  Result Value Ref Range Status   Specimen Description ASCITIC  Final   Special Requests 10CC  Final   Culture   Final    NO GROWTH 3 DAYS Performed at [facility], [address], [place], [place] [address]    Report Status PENDING  Incomplete  Urine Culture     Status: Abnormal   Collection Time: 02/28/22  5:50 PM   Specimen: Urine, Clean Catch  Result Value Ref Range Status   Specimen Description URINE, CLEAN CATCH  Final   Special Requests NONE  Final   Culture (A)  Final    40,000 COLONIES/mL STREPTOCOCCUS AGALACTIAE TESTING AGAINST S. AGALACTIAE NOT ROUTINELY PERFORMED DUE TO PREDICTABILITY OF AMP/PEN/VAN SUSCEPTIBILITY.    Report Status 03/01/2022 FINAL  Final     Time coordinating discharge: Over 30 minutes  SIGNED:   Alexis Reber J [place] DO  [facility] 03/02/2022, 1:11 PM

## 2022-03-02 NOTE — Progress Notes (Signed)
  Echocardiogram 2D Echocardiogram has been performed.  Patrick Humphrey 03/02/2022, 11:46 AM

## 2022-03-02 NOTE — Progress Notes (Signed)
Called care link for transport talked to [place].  Called [facility] to give report talked to [place].  Going to [place].

## 2022-03-02 NOTE — Progress Notes (Signed)
Transport called not able to transport today no time for tomorrow.  He is on the list.

## 2022-03-02 NOTE — Progress Notes (Signed)
[facility] Progress Note  Chief Complaint: Decompensated cirrhosis  History:  No clinical events since I saw him yesterday.  Abdominal bloating from ascites remains about the same to his estimation. Internal medicine and nephrology notes were reviewed.  He remains on albumin, midodrine and octreotide, and made approximately 725 cc of urine yesterday.   ROS: Cardiovascular: He denies chest pain Respiratory: He denies dyspnea Urinary: Dysuria improved after removal of Foley catheter  Objective:   Current Facility-Administered Medications:    0.9 %  sodium chloride infusion, , Intravenous, Continuous, [place] Donnamarie Poag, DO, Last Rate: 50 mL/hr at 03/01/22 2136, New Bag at 03/01/22 2136   albumin human 25 % solution 25 g, 25 g, Intravenous, BID, Reesa Chew, MD, Last Rate: 60 mL/hr at 03/01/22 2154, 25 g at 03/01/22 2154   ALPRAZolam (XANAX) tablet 0.5 mg, 0.5 mg, Oral, BID PRN, [place] Eric J, DO, 0.5 mg at 03/01/22 2147   aspirin EC tablet 81 mg, 81 mg, Oral, Daily, [place]ger[place]awy, Silver Huguenin, MD, 81 mg at 03/01/22 1033   Chlorhexidine Gluconate Cloth 2 % PADS 6 each, 6 each, Topical, Daily, Elgergawy, Silver Huguenin, MD, 6 each at 03/01/22 1037   heparin injection 5,000 Units, 5,000 Units, Subcutaneous, Q8H, Elgergawy, Silver Huguenin, MD, 5,000 Units at 03/01/22 2143   midodrine (PROAMATINE) tablet 5 mg, 5 mg, Oral, TID WC, Reesa Chew, MD, 5 mg at 03/01/22 1642   mycophenolate (MYFORTIC) EC tablet 360 mg, 360 mg, Oral, BID, Elgergawy, Silver Huguenin, MD, 360 mg at 03/01/22 2144   mycophenolate (MYFORTIC) EC tablet 360 mg, 360 mg, Oral, Once, Elgergawy, Silver Huguenin, MD   octreotide (SANDOSTATIN) injection 100 mcg, 100 mcg, Subcutaneous, Q8H, Reesa Chew, MD, 100 mcg at 03/01/22 2149   olopatadine (PATANOL) 0.1 % ophthalmic solution 1 drop, 1 drop, Both Eyes, Daily PRN, Elgergawy, Silver Huguenin, MD   Oral care mouth rinse, 15 mL, Mouth Rinse, PRN, Elgergawy, Silver Huguenin, MD   pantoprazole (PROTONIX) EC  tablet 40 mg, 40 mg, Oral, BID AC, Elgergawy, Silver Huguenin, MD, 40 mg at 03/01/22 1642   predniSONE (DELTASONE) tablet 2.5 mg, 2.5 mg, Oral, Daily, Elgergawy, Silver Huguenin, MD, 2.5 mg at 03/01/22 1034   sodium bicarbonate tablet 650 mg, 650 mg, Oral, BID, Reesa Chew, MD, 650 mg at 03/01/22 2144   tacrolimus (PROGRAF) capsule 3 mg, 3 mg, Oral, Daily, 3 mg at 03/01/22 1034 **AND** tacrolimus (PROGRAF) capsule 2 mg, 2 mg, Oral, QHS, Elgergawy, Silver Huguenin, MD, 2 mg at 03/01/22 2143   tamsulosin (FLOMAX) capsule 0.4 mg, 0.4 mg, Oral, Daily, Elgergawy, Silver Huguenin, MD, 0.4 mg at 03/01/22 1033   sodium chloride 50 mL/hr at 03/01/22 2136   albumin human 25 g (03/01/22 2154)     Vital signs in last 24 hrs: Vitals:   03/01/22 2055 03/02/22 0525  BP: (!) 129/94 (!) 150/95  Pulse: 84 90  Resp: 18 18  Temp: 98.4 F (36.9 C) 98.5 F (36.9 C)  SpO2: 99% 100%    Intake/Output Summary (Last 24 hours) at 03/02/2022 0552 Last data filed at 03/02/2022 0500 Gross per 24 hour  Intake 1080.94 ml  Output 725 ml  Net 355.94 ml     Physical Exam Alert and conversational.  Speech fluent, no asterixis.  HEENT: sclera anicteric, oral mucosa without lesions Neck: supple, no thyromegaly, JVD or lymphadenopathy Cardiac: RRR without murmurs, S1S2 heard, no peripheral edema Pulm: clear to auscultation bilaterally, normal RR and effort noted Abdomen: soft, large volume  ascites with bulging flanks and distention, however no tenderness.  Active bowel sounds of normal character.  No palpable spleen tip, though exam limited by ascites.  No visible abdominal wall collaterals Skin; warm and dry, no jaundice  Recent Labs:     Latest Ref Rng & Units 03/01/2022    2:09 AM 02/28/2022    5:30 PM 02/01/2022    2:59 PM  CBC  WBC 4.0 - 10.5 K/uL 4.9  5.6  4.0   Hemoglobin 13.0 - 17.0 g/dL 12.2  12.4  12.9   Hematocrit 39.0 - 52.0 % 35.0  36.3  34.8   Platelets 150 - 400 K/uL 126  150  112     Recent Labs  Lab  03/01/22 1153  INR 1.2      Latest Ref Rng & Units 03/02/2022    3:20 AM 03/01/2022    2:09 AM 02/28/2022    5:30 PM  CMP  Glucose 70 - 99 mg/dL 141  120  143   BUN 6 - 20 mg/dL 63  63  64   Creatinine 0.61 - 1.24 mg/dL 7.07  6.39  6.55   Sodium 135 - 145 mmol/L 139  135  134   Potassium 3.5 - 5.1 mmol/L 5.2  5.2  4.4   Chloride 98 - 111 mmol/L 105  101  106   CO2 22 - 32 mmol/L '19  17  18   '$ Calcium 8.9 - 10.3 mg/dL 9.2  8.9  8.5   Total Protein 6.5 - 8.1 g/dL 6.3   6.1   Total Bilirubin 0.3 - 1.2 mg/dL 1.5   1.9   Alkaline Phos 38 - 126 U/L 99   118   AST 15 - 41 U/L 51   51   ALT 0 - 44 U/L 28   33    INR 1.2  AFP pending  Radiologic studies:  CLINICAL DATA:  Ascites in liver dysfunction.   EXAM: DUPLEX ULTRASOUND OF LIVER   TECHNIQUE: Color and duplex Doppler ultrasound was performed to evaluate the hepatic in-flow and out-flow vessels.   COMPARISON:  Liver elastography performed on February 02, 2022.   FINDINGS: Liver: Echogenic and heterogeneous parenchymal pattern in the liver. Evidence of surface nodularity and perihepatic ascites.   Exam compromised by patient body habitus and degree of steatosis and heterogeneity. No gross liver lesion is identified on this limited evaluation. Signs of moderate perihepatic ascites which appear to have increased since previous imaging.   Main Portal Vein size: 1.0 cm   Portal Vein Velocities   Main Prox:  18.5 cm/sec   Main Mid: 18.1 cm/sec   Main Dist:  19.4 cm/sec Right: 14.5 cm/sec Left: 12.0 cm/sec   Hepatic Vein Velocities   Right:  20.2 cm/sec   Middle:  40.7 cm/sec   Left:  16.9 cm/sec   IVC: IVC is patent with 23.6 centimeter/second flow.   Hepatic Artery Velocity:  86.6 cm/sec   Splenic Vein Velocity:  19.4 cm/sec   Spleen: 14 x 13.3 x 5.6 (volume = 550)cm with a total volume of 550 cm^3 (411 cm^3 is upper limit normal)   Portal Vein Occlusion/Thrombus: No   Splenic Vein  Occlusion/Thrombus: No   Ascites: Moderate to large volume of ascites.   Varices: Question of recanalized umbilical vein via LEFT portal vein.   RIGHT pleural effusion.   IMPRESSION: 1. Patent portal venous system with normal directional flow. 2. Patent hepatic venous system and patent hepatic arterial supply. 3. Hepatic  cirrhosis and portal hypertension with splenomegaly and moderate to large volume of ascites. Question of early recanalized umbilical vein via LEFT portal vein. 4. RIGHT pleural effusion.     Electronically Signed   By: Zetta Bills M.D.   On: 03/01/2022 18:41   _________________________________-   Nov 2022 RUQ US showed appearance of probably fatty liver ___________________________  2D echocardiogram done earlier today, no report yet. ______________________________- Assessment & Plan  Assessment: Decompensated cirrhosis of unclear cause.  Suspect longstanding fatty liver and possible congestive hepatopathy in the past prior to his heart transplant. Preserved synthetic function, no encephalopathy, no acute GI bleeding. As noted yesterday, ferritin of the thousand with normal iron saturation, HFE testing reportedly done as outpatient, no results yet.  Hepatorenal syndrome with no improvement in creatinine, still oliguric.  Mildly acidemic, mildly elevated potassium.  Nephrology watching him closely and he may yet need dialysis.  History of heart transplant, 2D echocardiogram from today report pending to rule out significant valvular or ventricular dysfunction.  Plan:  He needs ongoing supportive care of his hepatorenal syndrome which I believe currently precludes TIPS placement.  I have just learned from Dr. [place] that this patient will be transferred to [facility] later today for evaluation by the liver transplant team and of course nephrology and advanced cardiology care.  Thank you for involving [place] in his care.  Nelida Meuse III Office:  [phone]

## 2022-03-02 NOTE — Progress Notes (Signed)
PROGRESS NOTE    Patrick Humphrey  YNW:[id number] DOB: 01/14/1966 DOA: 02/28/2022 PCP: Monico Blitz, MD    Brief Narrative:   Patrick Humphrey is a 56 y.o. male with past medical history significant for nonischemic cardiomyopathy s/p AICD, (Dx 2002), LVAD (6578, complicated by RV failure, A-fib/flutter and ultimately underwent heart transplant 2018 on immunosuppression with mycophenolate and tacrolimus, recent diagnosis of cirrhosis with recurrent ascites in which she has received paracentesis x 2 over the last 2 weeks, chronic kidney disease stage IIIb who presented to [facility] ED on 11/22 for lab abnormality.  Patient's GI physician ordered labs and was noted to have a creatinine of 5 which is above his previous baseline of 2-2 0.5.  Patient also endorses decreased urinary output, generalized weakness.  In the ED, temperature 97.7 F, HR 100, RR 19, BP 99/72, SpO2 100% on room air.  WBC 5.6, hemoglobin 12.4, platelets 150.  Sodium 134, potassium 4.4, chloride 106, CO2 18, glucose 143, BUN 64, creatinine 6.55.  AST 51, ALT 33, total bilirubin 1.9.  Albumin 3.0.  Urinalysis unrevealing.  Urine sodium less than 10.  UDS positive for benzodiazepines.  Nephrology was consulted.  Foley catheter was placed with minimal urine output.  Patient was transferred to [facility] for further evaluation and management under the hospitalist service.  Assessment & Plan:   Acute renal failure on CKD stage IIIb Patient presenting to ED with elevated creatinine of 5 from outpatient labs.  Patient's baseline creatinine 2.0-2.5.  Patient also endorses decreased urinary output over the last several weeks, thought secondary to BPH.  Foley catheter was placed in the ED with minimal urine output.  Creatinine on admission 6.55.  Renal ultrasound with echogenic kidneys suggestive of underlying medical renal disease, no evidence of hydronephrosis and decompressed bladder with Foley catheter.  Nephrology was consulted and  given new onset cirrhosis, concern for hepatorenal syndrome. -- Nephrology following, appreciate assistance -- Cr 6.55>6.39>7.07 -- TTE: Pending -- Started on midodrine 5 mg PO TID and octreotide 100 mcg TID -- Albumin 25 g IV BID x 4 doses -- Avoid nephrotoxins, renal dose all medications -- Closely monitor urine output; Foley catheter removed 11/24 -- BMP daily  Cirrhosis, decompensated with ascites Portal hypertension Steatosis noted on previous imaging.  Renal ultrasound with hepatic cirrhosis and moderate ascites.  Patient has been followed by GI in [place] with recent paracentesis x 2, last on 02/27/2022 with 3.3 L removed.  Ultrasound right upper quadrant with patent portal venous system with normal flow, noted hepatic cirrhosis and portal hypertension with splenomegaly and moderate/large volume ascites.  Nephrology concern for hepatorenal syndrome and requested GI consultation for consideration of TIPS; but due to renal failure not a TIPS candidate per GI.  -- [facility] following; appreciate assistance -- Fluid restriction 1200 mL/day  Nonischemic cardiomyopathy s/p heart transplant on immunosuppression Essential hypertension Patient follows with [facility] outpatient, Dr. Katy Apo; last seen on 08/25/2021.  Last TTE 01/20/2021 with LVEF greater than 55%. -- TTE: Pending -- Holding home amlodipine -- Continue mycophenolate 360 mg p.o. twice daily -- Continue tacrolimus 3 mg qAM and '2mg'$  qPM -- Continue prednisone 2.5 mg p.o. daily  BPH: Continue tamsulosin 0.4 mg p.o. daily  Anxiety --Alprazolam 0.5 mg p.o. twice daily as needed anxiety   DVT prophylaxis: heparin injection 5,000 Units Start: 03/01/22 2200    Code Status: Full Code Family Communication: Updated spouse and multiple family members present at bedside this morning  Disposition Plan:  Level  of care: Telemetry Medical Status is: Inpatient Remains inpatient appropriate because: Anticipate likely need of  hemodialysis    Consultants:  Nephrology [facility]  Procedures:  TTE: Pending Renal ultrasound Right upper quadrant ultrasound: Pending  Antimicrobials:  None   Subjective: Patient seen examined bedside, resting comfortably.  Sitting in bedside chair.  Seen by nephrology this morning.  Spouse and multiple family members present.  Foley catheter removed this morning.  Unfortunately kidney function continues to deteriorate.  Nephrology continue to monitor her but anticipate likely need of hemodialysis.  Seen by GI yesterday and not a candidate for TIPS procedure given acute renal failure.  No other complaints or concerns at this time.  Patient denies headache, no dizziness, no chest pain, no palpitations, no shortness of breath, no fever/chills/night sweats, no nausea/vomiting/diarrhea, no cough/congestion, no focal weakness, no paresthesias.  No acute events overnight per nursing staff.  Objective: Vitals:   03/01/22 1551 03/01/22 2055 03/02/22 0525 03/02/22 1009  BP: 124/89 (!) 129/94 (!) 150/95 116/89  Pulse:  84 90 94  Resp: '20 18 18   '$ Temp: 97.9 F (36.6 C) 98.4 F (36.9 C) 98.5 F (36.9 C) 97.9 F (36.6 C)  TempSrc: Oral  Oral Oral  SpO2: 99% 99% 100% 97%  Weight:      Height:        Intake/Output Summary (Last 24 hours) at 03/02/2022 1049 Last data filed at 03/02/2022 1020 Gross per 24 hour  Intake 1538.47 ml  Output 726 ml  Net 812.47 ml   Filed Weights   02/28/22 1705  Weight: 70.2 kg    Examination:  Physical Exam: GEN: NAD, alert and oriented x 3, wd/wn HEENT: NCAT, PERRL, EOMI, sclera clear, MMM PULM: CTAB w/o wheezes/crackles, normal respiratory effort, on room air CV: RRR w/o M/G/R GI: abd soft, nontender to palpation, + distention, NABS, no R/G/M MSK: no peripheral edema, muscle strength globally intact 5/5 bilateral upper/lower extremities NEURO: CN II-XII intact, no focal deficits, sensation to light touch intact PSYCH: normal  mood/affect Integumentary: dry/intact, no rashes or wounds    Data Reviewed: I have personally reviewed following labs and imaging studies  CBC: Recent Labs  Lab 02/28/22 1730 03/01/22 0209  WBC 5.6 4.9  NEUTROABS 4.2  --   HGB 12.4* 12.2*  HCT 36.3* 35.0*  MCV 99.5 99.2  PLT 150 951*   Basic Metabolic Panel: Recent Labs  Lab 02/28/22 1730 03/01/22 0209 03/02/22 0320  NA 134* 135 139  K 4.4 5.2* 5.2*  CL 106 101 105  CO2 18* 17* 19*  GLUCOSE 143* 120* 141*  BUN 64* 63* 63*  CREATININE 6.55* 6.39* 7.07*  CALCIUM 8.5* 8.9 9.2   GFR: Estimated Creatinine Clearance: 11.4 mL/min (A) (by C-G formula based on SCr of 7.07 mg/dL (H)). Liver Function Tests: Recent Labs  Lab 02/28/22 1730 03/02/22 0320  AST 51* 51*  ALT 33 28  ALKPHOS 118 99  BILITOT 1.9* 1.5*  PROT 6.1* 6.3*  ALBUMIN 3.0* 3.4*   No results for input(s): "LIPASE", "AMYLASE" in the last 168 hours. No results for input(s): "AMMONIA" in the last 168 hours. Coagulation Profile: Recent Labs  Lab 03/01/22 1153  INR 1.2   Cardiac Enzymes: No results for input(s): "CKTOTAL", "CKMB", "CKMBINDEX", "TROPONINI" in the last 168 hours. BNP (last 3 results) No results for input(s): "PROBNP" in the last 8760 hours. HbA1C: No results for input(s): "HGBA1C" in the last 72 hours. CBG: No results for input(s): "GLUCAP" in the last 168 hours.  Lipid Profile: No results for input(s): "CHOL", "HDL", "LDLCALC", "TRIG", "CHOLHDL", "LDLDIRECT" in the last 72 hours. Thyroid Function Tests: No results for input(s): "TSH", "T4TOTAL", "FREET4", "T3FREE", "THYROIDAB" in the last 72 hours. Anemia Panel: No results for input(s): "VITAMINB12", "FOLATE", "FERRITIN", "TIBC", "IRON", "RETICCTPCT" in the last 72 hours. Sepsis Labs: No results for input(s): "PROCALCITON", "LATICACIDVEN" in the last 168 hours.  Recent Results (from the past 240 hour(s))  Gram stain     Status: None   Collection Time: 02/27/22  9:45 AM    Specimen: Ascitic  Result Value Ref Range Status   Specimen Description ASCITIC BOTTLES DRAWN AEROBIC AND ANAEROBIC  Final   Special Requests PERITONEAL 10CC  Final   Gram Stain   Final    NO ORGANISMS SEEN WBC PRESENT, PREDOMINANTLY MONONUCLEAR CYTOSPIN SMEAR Performed at [facility], [address], [place], [place] [address]    Report Status 02/27/2022 FINAL  Final  Culture, body fluid w Gram Stain-bottle     Status: None (Preliminary result)   Collection Time: 02/27/22  9:45 AM   Specimen: Ascitic  Result Value Ref Range Status   Specimen Description ASCITIC  Final   Special Requests 10CC  Final   Culture   Final    NO GROWTH 3 DAYS Performed at [facility], [address], [place], [place] [address]    Report Status PENDING  Incomplete  Urine Culture     Status: Abnormal   Collection Time: 02/28/22  5:50 PM   Specimen: Urine, Clean Catch  Result Value Ref Range Status   Specimen Description URINE, CLEAN CATCH  Final   Special Requests NONE  Final   Culture (A)  Final    40,000 COLONIES/mL STREPTOCOCCUS AGALACTIAE TESTING AGAINST S. AGALACTIAE NOT ROUTINELY PERFORMED DUE TO PREDICTABILITY OF AMP/PEN/VAN SUSCEPTIBILITY.    Report Status 03/01/2022 FINAL  Final         Radiology Studies: US ABDOMEN LIMITED WITH LIVER DOPPLER  Result Date: 03/01/2022 CLINICAL DATA:  Ascites in liver dysfunction. EXAM: DUPLEX ULTRASOUND OF LIVER TECHNIQUE: Color and duplex Doppler ultrasound was performed to evaluate the hepatic in-flow and out-flow vessels. COMPARISON:  Liver elastography performed on February 02, 2022. FINDINGS: Liver: Echogenic and heterogeneous parenchymal pattern in the liver. Evidence of surface nodularity and perihepatic ascites. Exam compromised by patient body habitus and degree of steatosis and heterogeneity. No gross liver lesion is identified on this limited evaluation. Signs of moderate perihepatic ascites which appear to have increased since previous imaging.  Main Portal Vein size: 1.0 cm Portal Vein Velocities Main Prox:  18.5 cm/sec Main Mid: 18.1 cm/sec Main Dist:  19.4 cm/sec Right: 14.5 cm/sec Left: 12.0 cm/sec Hepatic Vein Velocities Right:  20.2 cm/sec Middle:  40.7 cm/sec Left:  16.9 cm/sec IVC: IVC is patent with 23.6 centimeter/second flow. Hepatic Artery Velocity:  86.6 cm/sec Splenic Vein Velocity:  19.4 cm/sec Spleen: 14 x 13.3 x 5.6 (volume = 550)cm with a total volume of 550 cm^3 (411 cm^3 is upper limit normal) Portal Vein Occlusion/Thrombus: No Splenic Vein Occlusion/Thrombus: No Ascites: Moderate to large volume of ascites. Varices: Question of recanalized umbilical vein via LEFT portal vein. RIGHT pleural effusion. IMPRESSION: 1. Patent portal venous system with normal directional flow. 2. Patent hepatic venous system and patent hepatic arterial supply. 3. Hepatic cirrhosis and portal hypertension with splenomegaly and moderate to large volume of ascites. Question of early recanalized umbilical vein via LEFT portal vein. 4. RIGHT pleural effusion. Electronically Signed   By: Jewel Baize.D.  On: 03/01/2022 18:41   US RENAL  Result Date: 03/01/2022 CLINICAL DATA:  Acute kidney injury EXAM: RENAL / URINARY TRACT ULTRASOUND COMPLETE COMPARISON:  None Available. FINDINGS: Right Kidney: Renal measurements: 9.0 x 4.6 x 5.0 cm = volume: 109 mL. Echogenic renal parenchyma with increased conspicuity of the corticomedullary interface. No hydronephrosis. No solid lesion. Left Kidney: Renal measurements: 10.0 x 5.1 x 5.2 cm = volume: 138 mL. Increased echogenicity of the renal parenchyma. Small circumscribed anechoic simple cyst in the lower pole measures 1.0 x 0.9 x 1.4 cm. No imaging follow-up recommended. No hydronephrosis. Bladder: Foley catheter in place.  The bladder is decompressed. Other: Enlarged and nodular liver.  Moderate ascites. IMPRESSION: 1. Hepatic cirrhosis with ascites. 2. Echogenic kidneys suggests underlying medical renal disease. 3.  No evidence of hydronephrosis. 4. Decompressed bladder containing a Foley catheter. Electronically Signed   By: Jacqulynn Cadet M.D.   On: 03/01/2022 07:22        Scheduled Meds:  aspirin EC  81 mg Oral Daily   Chlorhexidine Gluconate Cloth  6 each Topical Daily   heparin  5,000 Units Subcutaneous Q8H   midodrine  5 mg Oral TID WC   mycophenolate  360 mg Oral BID   mycophenolate  360 mg Oral Once   octreotide  100 mcg Subcutaneous Q8H   pantoprazole  40 mg Oral BID AC   predniSONE  2.5 mg Oral Daily   sodium bicarbonate  650 mg Oral BID   tacrolimus  3 mg Oral Daily   And   tacrolimus  2 mg Oral QHS   tamsulosin  0.4 mg Oral Daily   Continuous Infusions:  albumin human 25 g (03/02/22 0845)     LOS: 2 days    Time spent: 50 minutes spent on chart review, discussion with nursing staff, consultants, updating family and interview/physical exam; more than 50% of that time was spent in counseling and/or coordination of care.    Eletha Culbertson J [place] DO [facility] Available via Epic secure chat 7am-7pm After these hours, please refer to coverage provider listed on [email or web address] 03/02/2022, 10:49 AM

## 2022-03-03 DIAGNOSIS — N179 Acute kidney failure, unspecified: Secondary | ICD-10-CM | POA: Diagnosis not present

## 2022-03-03 DIAGNOSIS — Z941 Heart transplant status: Secondary | ICD-10-CM | POA: Diagnosis not present

## 2022-03-03 DIAGNOSIS — N189 Chronic kidney disease, unspecified: Secondary | ICD-10-CM | POA: Diagnosis not present

## 2022-03-03 DIAGNOSIS — R188 Other ascites: Secondary | ICD-10-CM | POA: Diagnosis not present

## 2022-03-03 DIAGNOSIS — K766 Portal hypertension: Secondary | ICD-10-CM | POA: Diagnosis not present

## 2022-03-03 DIAGNOSIS — R918 Other nonspecific abnormal finding of lung field: Secondary | ICD-10-CM | POA: Diagnosis not present

## 2022-03-03 DIAGNOSIS — K7460 Unspecified cirrhosis of liver: Secondary | ICD-10-CM | POA: Diagnosis not present

## 2022-03-03 LAB — AFP TUMOR MARKER: AFP, Serum, Tumor Marker: 3.7 ng/mL (ref 0.0–8.4)

## 2022-03-04 DIAGNOSIS — N179 Acute kidney failure, unspecified: Secondary | ICD-10-CM | POA: Diagnosis not present

## 2022-03-04 DIAGNOSIS — R188 Other ascites: Secondary | ICD-10-CM | POA: Diagnosis not present

## 2022-03-04 DIAGNOSIS — K7460 Unspecified cirrhosis of liver: Secondary | ICD-10-CM | POA: Diagnosis not present

## 2022-03-04 LAB — CULTURE, BODY FLUID W GRAM STAIN -BOTTLE: Culture: NO GROWTH

## 2022-03-04 LAB — TACROLIMUS LEVEL: Tacrolimus (FK506) - LabCorp: 21.5 ng/mL — ABNORMAL HIGH (ref 2.0–20.0)

## 2022-03-05 ENCOUNTER — Encounter (HOSPITAL_COMMUNITY): Payer: Self-pay | Admitting: Internal Medicine

## 2022-03-05 DIAGNOSIS — N179 Acute kidney failure, unspecified: Secondary | ICD-10-CM | POA: Diagnosis not present

## 2022-03-05 DIAGNOSIS — I10 Essential (primary) hypertension: Secondary | ICD-10-CM | POA: Diagnosis not present

## 2022-03-05 DIAGNOSIS — Z941 Heart transplant status: Secondary | ICD-10-CM | POA: Diagnosis not present

## 2022-03-05 DIAGNOSIS — R188 Other ascites: Secondary | ICD-10-CM | POA: Diagnosis not present

## 2022-03-05 DIAGNOSIS — K7460 Unspecified cirrhosis of liver: Secondary | ICD-10-CM | POA: Diagnosis not present

## 2022-03-05 DIAGNOSIS — K766 Portal hypertension: Secondary | ICD-10-CM | POA: Diagnosis not present

## 2022-03-06 DIAGNOSIS — K7460 Unspecified cirrhosis of liver: Secondary | ICD-10-CM | POA: Diagnosis not present

## 2022-03-06 DIAGNOSIS — K766 Portal hypertension: Secondary | ICD-10-CM | POA: Diagnosis not present

## 2022-03-06 DIAGNOSIS — R188 Other ascites: Secondary | ICD-10-CM | POA: Diagnosis not present

## 2022-03-06 DIAGNOSIS — Z941 Heart transplant status: Secondary | ICD-10-CM | POA: Diagnosis not present

## 2022-03-06 DIAGNOSIS — N179 Acute kidney failure, unspecified: Secondary | ICD-10-CM | POA: Diagnosis not present

## 2022-03-06 DIAGNOSIS — I10 Essential (primary) hypertension: Secondary | ICD-10-CM | POA: Diagnosis not present

## 2022-03-07 ENCOUNTER — Ambulatory Visit (HOSPITAL_COMMUNITY)
Admission: RE | Admit: 2022-03-07 | Discharge: 2022-03-07 | Disposition: A | Discharge status: Home / Self Care | Payer: Medicare Other | Source: Ambulatory Visit | Attending: Gastroenterology | Admitting: Gastroenterology

## 2022-03-07 ENCOUNTER — Telehealth: Payer: Self-pay

## 2022-03-07 DIAGNOSIS — K7460 Unspecified cirrhosis of liver: Secondary | ICD-10-CM | POA: Diagnosis not present

## 2022-03-07 DIAGNOSIS — M109 Gout, unspecified: Secondary | ICD-10-CM | POA: Diagnosis not present

## 2022-03-07 DIAGNOSIS — I10 Essential (primary) hypertension: Secondary | ICD-10-CM | POA: Diagnosis not present

## 2022-03-07 DIAGNOSIS — K766 Portal hypertension: Secondary | ICD-10-CM | POA: Diagnosis not present

## 2022-03-07 DIAGNOSIS — Z941 Heart transplant status: Secondary | ICD-10-CM | POA: Diagnosis not present

## 2022-03-07 DIAGNOSIS — R188 Other ascites: Secondary | ICD-10-CM | POA: Diagnosis not present

## 2022-03-07 DIAGNOSIS — N179 Acute kidney failure, unspecified: Secondary | ICD-10-CM | POA: Diagnosis not present

## 2022-03-07 MED FILL — Morphine Sulfate Inj 4 MG/ML: INTRAMUSCULAR | Qty: 0.25 | Status: AC

## 2022-03-07 NOTE — Telephone Encounter (Signed)
Results from [place] were received that were done by Provider T Barbra Sarks. Copy is in your box for review.

## 2022-03-08 DIAGNOSIS — M109 Gout, unspecified: Secondary | ICD-10-CM | POA: Diagnosis not present

## 2022-03-08 DIAGNOSIS — N179 Acute kidney failure, unspecified: Secondary | ICD-10-CM | POA: Diagnosis not present

## 2022-03-08 DIAGNOSIS — Z941 Heart transplant status: Secondary | ICD-10-CM | POA: Diagnosis not present

## 2022-03-08 DIAGNOSIS — K7460 Unspecified cirrhosis of liver: Secondary | ICD-10-CM | POA: Diagnosis not present

## 2022-03-08 DIAGNOSIS — I10 Essential (primary) hypertension: Secondary | ICD-10-CM | POA: Diagnosis not present

## 2022-03-08 DIAGNOSIS — R188 Other ascites: Secondary | ICD-10-CM | POA: Diagnosis not present

## 2022-03-08 DIAGNOSIS — K766 Portal hypertension: Secondary | ICD-10-CM | POA: Diagnosis not present

## 2022-03-09 DIAGNOSIS — K766 Portal hypertension: Secondary | ICD-10-CM | POA: Diagnosis not present

## 2022-03-09 DIAGNOSIS — M109 Gout, unspecified: Secondary | ICD-10-CM | POA: Diagnosis not present

## 2022-03-09 DIAGNOSIS — N179 Acute kidney failure, unspecified: Secondary | ICD-10-CM | POA: Diagnosis not present

## 2022-03-09 DIAGNOSIS — I10 Essential (primary) hypertension: Secondary | ICD-10-CM | POA: Diagnosis not present

## 2022-03-09 DIAGNOSIS — K7460 Unspecified cirrhosis of liver: Secondary | ICD-10-CM | POA: Diagnosis not present

## 2022-03-09 DIAGNOSIS — R188 Other ascites: Secondary | ICD-10-CM | POA: Diagnosis not present

## 2022-03-09 DIAGNOSIS — Z941 Heart transplant status: Secondary | ICD-10-CM | POA: Diagnosis not present

## 2022-03-10 DIAGNOSIS — N179 Acute kidney failure, unspecified: Secondary | ICD-10-CM | POA: Diagnosis not present

## 2022-03-13 DIAGNOSIS — I5022 Chronic systolic (congestive) heart failure: Secondary | ICD-10-CM | POA: Diagnosis not present

## 2022-03-13 DIAGNOSIS — R109 Unspecified abdominal pain: Secondary | ICD-10-CM | POA: Diagnosis not present

## 2022-03-13 DIAGNOSIS — I10 Essential (primary) hypertension: Secondary | ICD-10-CM | POA: Diagnosis not present

## 2022-03-13 DIAGNOSIS — Z299 Encounter for prophylactic measures, unspecified: Secondary | ICD-10-CM | POA: Diagnosis not present

## 2022-03-13 DIAGNOSIS — Z6822 Body mass index (BMI) 22.0-22.9, adult: Secondary | ICD-10-CM | POA: Diagnosis not present

## 2022-03-13 DIAGNOSIS — I480 Paroxysmal atrial fibrillation: Secondary | ICD-10-CM | POA: Diagnosis not present

## 2022-03-19 DIAGNOSIS — Z9225 Personal history of immunosupression therapy: Secondary | ICD-10-CM | POA: Diagnosis not present

## 2022-03-20 ENCOUNTER — Other Ambulatory Visit: Payer: Self-pay

## 2022-03-20 ENCOUNTER — Emergency Department (HOSPITAL_COMMUNITY)
Admission: EM | Admit: 2022-03-20 | Discharge: 2022-03-20 | Disposition: A | Discharge status: Home / Self Care | Payer: Medicare Other | Attending: Emergency Medicine | Admitting: Emergency Medicine

## 2022-03-20 ENCOUNTER — Encounter (HOSPITAL_COMMUNITY): Payer: Self-pay | Admitting: Emergency Medicine

## 2022-03-20 DIAGNOSIS — Z7982 Long term (current) use of aspirin: Secondary | ICD-10-CM | POA: Insufficient documentation

## 2022-03-20 DIAGNOSIS — R1900 Intra-abdominal and pelvic swelling, mass and lump, unspecified site: Secondary | ICD-10-CM | POA: Diagnosis present

## 2022-03-20 DIAGNOSIS — D6489 Other specified anemias: Secondary | ICD-10-CM | POA: Diagnosis not present

## 2022-03-20 DIAGNOSIS — R7989 Other specified abnormal findings of blood chemistry: Secondary | ICD-10-CM

## 2022-03-20 DIAGNOSIS — D649 Anemia, unspecified: Secondary | ICD-10-CM | POA: Diagnosis not present

## 2022-03-20 DIAGNOSIS — R188 Other ascites: Secondary | ICD-10-CM | POA: Insufficient documentation

## 2022-03-20 LAB — COMPREHENSIVE METABOLIC PANEL
ALT: 22 U/L (ref 0–44)
AST: 30 U/L (ref 15–41)
Albumin: 3.6 g/dL (ref 3.5–5.0)
Alkaline Phosphatase: 79 U/L (ref 38–126)
Anion gap: 8 (ref 5–15)
BUN: 44 mg/dL — ABNORMAL HIGH (ref 6–20)
CO2: 20 mmol/L — ABNORMAL LOW (ref 22–32)
Calcium: 8.7 mg/dL — ABNORMAL LOW (ref 8.9–10.3)
Chloride: 112 mmol/L — ABNORMAL HIGH (ref 98–111)
Creatinine, Ser: 4.13 mg/dL — ABNORMAL HIGH (ref 0.61–1.24)
GFR, Estimated: 16 mL/min — ABNORMAL LOW (ref >60)
Glucose, Bld: 93 mg/dL (ref 70–99)
Potassium: 4.2 mmol/L (ref 3.5–5.1)
Sodium: 140 mmol/L (ref 135–145)
Total Bilirubin: 1.2 mg/dL (ref 0.3–1.2)
Total Protein: 6.4 g/dL — ABNORMAL LOW (ref 6.5–8.1)

## 2022-03-20 LAB — CBC WITH DIFFERENTIAL/PLATELET
Abs Immature Granulocytes: 0.02 10*3/uL (ref 0.00–0.07)
Basophils Absolute: 0 10*3/uL (ref 0.0–0.1)
Basophils Relative: 1 %
Eosinophils Absolute: 0.1 10*3/uL (ref 0.0–0.5)
Eosinophils Relative: 2 %
HCT: 30.1 % — ABNORMAL LOW (ref 39.0–52.0)
Hemoglobin: 9.9 g/dL — ABNORMAL LOW (ref 13.0–17.0)
Immature Granulocytes: 0 %
Lymphocytes Relative: 17 %
Lymphs Abs: 0.8 10*3/uL (ref 0.7–4.0)
MCH: 33.3 pg (ref 26.0–34.0)
MCHC: 32.9 g/dL (ref 30.0–36.0)
MCV: 101.3 fL — ABNORMAL HIGH (ref 80.0–100.0)
Monocytes Absolute: 0.3 10*3/uL (ref 0.1–1.0)
Monocytes Relative: 6 %
Neutro Abs: 3.5 10*3/uL (ref 1.7–7.7)
Neutrophils Relative %: 74 %
Platelets: 133 10*3/uL — ABNORMAL LOW (ref 150–400)
RBC: 2.97 MIL/uL — ABNORMAL LOW (ref 4.22–5.81)
RDW: 14 % (ref 11.5–15.5)
WBC: 4.7 10*3/uL (ref 4.0–10.5)
nRBC: 0 % (ref 0.0–0.2)

## 2022-03-20 LAB — URINALYSIS, ROUTINE W REFLEX MICROSCOPIC
Bacteria, UA: NONE SEEN
Bilirubin Urine: NEGATIVE
Glucose, UA: NEGATIVE mg/dL
Hgb urine dipstick: NEGATIVE
Ketones, ur: NEGATIVE mg/dL
Leukocytes,Ua: NEGATIVE
Nitrite: NEGATIVE
Protein, ur: 30 mg/dL — AB
Specific Gravity, Urine: 1.017 (ref 1.005–1.030)
pH: 5 (ref 5.0–8.0)

## 2022-03-20 LAB — PROTIME-INR
INR: 1.1 (ref 0.8–1.2)
Prothrombin Time: 14.4 seconds (ref 11.4–15.2)

## 2022-03-20 NOTE — ED Triage Notes (Signed)
Pt c/o abd swelling and sob at times.

## 2022-03-20 NOTE — ED Provider Notes (Signed)
[facility] EMERGENCY DEPARTMENT Provider Note   CSN: [id number] Arrival date & time: 03/20/22  0335     History  Chief Complaint  Patient presents with   ABD Swelling    Patrick Humphrey is a 56 y.o. male.  56 yo M w/ recent ARF from tacrolimus toxicity and general dehydration presents to the ER secondary to increasing abdominal ascites and the need to check his labs. Is supposed to have them checked q72 hours but did not get it done so his nephrologist told him to go the ER to have it done. Has decreased urine recently. Increasing ascites. Intermittent dyspnea. No other new symptoms.         Home Medications Prior to Admission medications   Medication Sig Start Date End Date Taking? Authorizing Provider  albumin human 25 % bottle Inject 100 mLs (25 g total) into the vein 2 (two) times daily. 03/02/22   [place] Donnamarie Poag, DO  ALPRAZolam Duanne Moron) 0.5 MG tablet Take 0.5 mg by mouth daily as needed for anxiety.    [provider]  aspirin EC 81 MG tablet Take 81 mg by mouth daily.    [provider]  cetirizine (ZYRTEC) 10 MG tablet Take 10 mg by mouth daily.    [provider]  fluticasone (FLONASE) 50 MCG/ACT nasal spray Place 2 sprays into both nostrils daily.     [provider]  midodrine (PROAMATINE) 5 MG tablet Take 1 tablet (5 mg total) by mouth 3 (three) times daily with meals. 03/02/22   [place] Donnamarie Poag, DO  mycophenolate (MYFORTIC) 360 MG TBEC EC tablet Take 360 mg by mouth 2 (two) times daily.    [provider]  octreotide (SANDOSTATIN) 100 MCG/ML SOLN injection Inject 1 mL (100 mcg total) into the skin every 8 (eight) hours. 03/02/22   [place] Eric J, DO  olopatadine (PATADAY) 0.1 % ophthalmic solution Place 1 drop into both eyes daily as needed (Red or itching eyes).    [provider]  ondansetron (ZOFRAN) 4 MG tablet Take 1 tablet (4 mg total) by mouth every 8 (eight) hours as needed for nausea or vomiting.  02/27/22   Annitta Needs, NP  pantoprazole (PROTONIX) 40 MG tablet Take 40 mg by mouth 2 (two) times daily.     [provider]  predniSONE (DELTASONE) 2.5 MG tablet Take 2.5 mg by mouth daily. 11/10/21   [provider]  sodium bicarbonate 650 MG tablet Take 1 tablet (650 mg total) by mouth 2 (two) times daily. 03/02/22   [place] Eric J, DO  tacrolimus (PROGRAF) 1 MG capsule Take 2-3 mg by mouth See admin instructions. Take 3 mg in the morning  and 2 mg at night    [provider]  tamsulosin (FLOMAX) 0.4 MG CAPS capsule Take 0.4 mg by mouth daily.    [provider]      Allergies    Penicillins, Propranolol, and Sulfa antibiotics    Review of Systems   Review of Systems  Physical Exam Updated Vital Signs BP (!) 149/101   Pulse 87   Temp 97.7 F (36.5 C) (Oral)   Resp (!) 21   Ht '5\' 11"'$  (1.803 m)   Wt 70 kg   SpO2 100%   BMI 21.52 kg/m  Physical Exam Vitals and nursing note reviewed.  Constitutional:      Appearance: He is well-developed.  HENT:     Head: Normocephalic and atraumatic.  Eyes:  Pupils: Pupils are equal, round, and reactive to light.  Cardiovascular:     Rate and Rhythm: Normal rate.  Pulmonary:     Effort: Pulmonary effort is normal. No respiratory distress.  Abdominal:     General: There is distension (with a fluid wave).  Musculoskeletal:        General: Normal range of motion.     Cervical back: Normal range of motion.  Skin:    General: Skin is warm and dry.  Neurological:     General: No focal deficit present.     Mental Status: He is alert.     ED Results / Procedures / Treatments   Labs (all labs ordered are listed, but only abnormal results are displayed) Labs Reviewed  CBC WITH DIFFERENTIAL/PLATELET - Abnormal; Notable for the following components:      Result Value   RBC 2.97 (*)    Hemoglobin 9.9 (*)    HCT 30.1 (*)    MCV 101.3 (*)    Platelets 133 (*)    All other components within normal  limits  COMPREHENSIVE METABOLIC PANEL - Abnormal; Notable for the following components:   Chloride 112 (*)    CO2 20 (*)    BUN 44 (*)    Creatinine, Ser 4.13 (*)    Calcium 8.7 (*)    Total Protein 6.4 (*)    GFR, Estimated 16 (*)    All other components within normal limits  URINALYSIS, ROUTINE W REFLEX MICROSCOPIC - Abnormal; Notable for the following components:   Protein, ur 30 (*)    All other components within normal limits  PROTIME-INR  TACROLIMUS LEVEL    EKG None  Radiology No results found.  Procedures Procedures    Medications Ordered in ED Medications - No data to display  ED Course/ Medical Decision Making/ A&P                           Medical Decision Making Amount and/or Complexity of Data Reviewed Labs: ordered.  Will check levels to ensure no acute renal failure.  Patient's creatinine was at 2.9 when last checked however now it is back up to 4.13. his hemoglobin is also below 10 which is new for him but likely related to his recent episode of kidney failure as he has no hemorrhagic symptoms.  Urine is as expected with medical renal disease.  I discussed consulting with his nephrology team and he prefers to contact them later today as he does not want to stay here or be transferred to [facility] right now.  This seems reasonable since he is still making urine and this is about the level he was discharged at.  He also states that the cardiology team actually increased his tacrolimus prior to leaving so he suspects it may be that getting to toxic levels again.  Final Clinical Impression(s) / ED Diagnoses Final diagnoses:  Other ascites  Elevated serum creatinine  Anemia due to other cause, not classified    Rx / DC Orders ED Discharge Orders     None         Areonna Bran, Corene Cornea, MD 03/20/22 [phone]

## 2022-03-20 NOTE — Discharge Instructions (Addendum)
Please contact your nephrologist as soon as the office opens so they are aware of your kidney levels.   Please ensure someone (possibly your kidney doctor) is following your hemoglobin levels although this is likely related to your kidney issues here recently.   Return her or your newest ED with any new or changing symptoms.

## 2022-03-22 DIAGNOSIS — Z48298 Encounter for aftercare following other organ transplant: Secondary | ICD-10-CM | POA: Diagnosis not present

## 2022-03-22 LAB — TACROLIMUS LEVEL: Tacrolimus (FK506) - LabCorp: 14.1 ng/mL (ref 2.0–20.0)

## 2022-03-23 DIAGNOSIS — Z79621 Long term (current) use of calcineurin inhibitor: Secondary | ICD-10-CM | POA: Diagnosis not present

## 2022-03-23 DIAGNOSIS — Z882 Allergy status to sulfonamides status: Secondary | ICD-10-CM | POA: Diagnosis not present

## 2022-03-23 DIAGNOSIS — Z9889 Other specified postprocedural states: Secondary | ICD-10-CM | POA: Diagnosis not present

## 2022-03-23 DIAGNOSIS — I10 Essential (primary) hypertension: Secondary | ICD-10-CM | POA: Diagnosis not present

## 2022-03-23 DIAGNOSIS — Z7982 Long term (current) use of aspirin: Secondary | ICD-10-CM | POA: Diagnosis not present

## 2022-03-23 DIAGNOSIS — Z941 Heart transplant status: Secondary | ICD-10-CM | POA: Diagnosis not present

## 2022-03-23 DIAGNOSIS — R188 Other ascites: Secondary | ICD-10-CM | POA: Diagnosis not present

## 2022-03-23 DIAGNOSIS — K7460 Unspecified cirrhosis of liver: Secondary | ICD-10-CM | POA: Diagnosis not present

## 2022-03-23 DIAGNOSIS — N289 Disorder of kidney and ureter, unspecified: Secondary | ICD-10-CM | POA: Diagnosis not present

## 2022-03-23 DIAGNOSIS — R0602 Shortness of breath: Secondary | ICD-10-CM | POA: Diagnosis not present

## 2022-03-23 DIAGNOSIS — Z95810 Presence of automatic (implantable) cardiac defibrillator: Secondary | ICD-10-CM | POA: Diagnosis not present

## 2022-03-23 DIAGNOSIS — M109 Gout, unspecified: Secondary | ICD-10-CM | POA: Diagnosis not present

## 2022-03-23 DIAGNOSIS — Z79899 Other long term (current) drug therapy: Secondary | ICD-10-CM | POA: Diagnosis not present

## 2022-03-23 DIAGNOSIS — Z87891 Personal history of nicotine dependence: Secondary | ICD-10-CM | POA: Diagnosis not present

## 2022-03-23 DIAGNOSIS — K766 Portal hypertension: Secondary | ICD-10-CM | POA: Diagnosis not present

## 2022-03-23 DIAGNOSIS — D84821 Immunodeficiency due to drugs: Secondary | ICD-10-CM | POA: Diagnosis not present

## 2022-03-23 DIAGNOSIS — N179 Acute kidney failure, unspecified: Secondary | ICD-10-CM | POA: Diagnosis not present

## 2022-03-23 DIAGNOSIS — R1032 Left lower quadrant pain: Secondary | ICD-10-CM | POA: Diagnosis not present

## 2022-03-23 DIAGNOSIS — N183 Chronic kidney disease, stage 3 unspecified: Secondary | ICD-10-CM | POA: Diagnosis not present

## 2022-03-23 DIAGNOSIS — Z880 Allergy status to penicillin: Secondary | ICD-10-CM | POA: Diagnosis not present

## 2022-03-23 DIAGNOSIS — R918 Other nonspecific abnormal finding of lung field: Secondary | ICD-10-CM | POA: Diagnosis not present

## 2022-03-23 DIAGNOSIS — I129 Hypertensive chronic kidney disease with stage 1 through stage 4 chronic kidney disease, or unspecified chronic kidney disease: Secondary | ICD-10-CM | POA: Diagnosis not present

## 2022-03-23 DIAGNOSIS — N184 Chronic kidney disease, stage 4 (severe): Secondary | ICD-10-CM | POA: Diagnosis not present

## 2022-03-23 DIAGNOSIS — R0600 Dyspnea, unspecified: Secondary | ICD-10-CM | POA: Diagnosis not present

## 2022-03-24 DIAGNOSIS — K766 Portal hypertension: Secondary | ICD-10-CM | POA: Diagnosis not present

## 2022-03-24 DIAGNOSIS — N184 Chronic kidney disease, stage 4 (severe): Secondary | ICD-10-CM | POA: Diagnosis not present

## 2022-03-24 DIAGNOSIS — K7460 Unspecified cirrhosis of liver: Secondary | ICD-10-CM | POA: Diagnosis not present

## 2022-03-24 DIAGNOSIS — N179 Acute kidney failure, unspecified: Secondary | ICD-10-CM | POA: Diagnosis not present

## 2022-03-24 DIAGNOSIS — R188 Other ascites: Secondary | ICD-10-CM | POA: Diagnosis not present

## 2022-03-24 DIAGNOSIS — N289 Disorder of kidney and ureter, unspecified: Secondary | ICD-10-CM | POA: Diagnosis not present

## 2022-03-25 DIAGNOSIS — N184 Chronic kidney disease, stage 4 (severe): Secondary | ICD-10-CM | POA: Diagnosis not present

## 2022-03-25 DIAGNOSIS — N179 Acute kidney failure, unspecified: Secondary | ICD-10-CM | POA: Diagnosis not present

## 2022-03-26 DIAGNOSIS — N179 Acute kidney failure, unspecified: Secondary | ICD-10-CM | POA: Diagnosis not present

## 2022-03-26 DIAGNOSIS — K7460 Unspecified cirrhosis of liver: Secondary | ICD-10-CM | POA: Diagnosis not present

## 2022-03-26 DIAGNOSIS — R188 Other ascites: Secondary | ICD-10-CM | POA: Diagnosis not present

## 2022-03-26 DIAGNOSIS — I10 Essential (primary) hypertension: Secondary | ICD-10-CM | POA: Diagnosis not present

## 2022-03-27 DIAGNOSIS — N179 Acute kidney failure, unspecified: Secondary | ICD-10-CM | POA: Diagnosis not present

## 2022-03-28 ENCOUNTER — Ambulatory Visit: Payer: Medicare Other | Admitting: Gastroenterology

## 2022-03-28 DIAGNOSIS — N451 Epididymitis: Secondary | ICD-10-CM | POA: Diagnosis not present

## 2022-03-28 DIAGNOSIS — Z880 Allergy status to penicillin: Secondary | ICD-10-CM | POA: Diagnosis not present

## 2022-03-28 DIAGNOSIS — N289 Disorder of kidney and ureter, unspecified: Secondary | ICD-10-CM | POA: Diagnosis not present

## 2022-04-03 DIAGNOSIS — Z9225 Personal history of immunosupression therapy: Secondary | ICD-10-CM | POA: Diagnosis not present

## 2022-04-04 DIAGNOSIS — Z299 Encounter for prophylactic measures, unspecified: Secondary | ICD-10-CM | POA: Diagnosis not present

## 2022-04-04 DIAGNOSIS — R188 Other ascites: Secondary | ICD-10-CM | POA: Diagnosis not present

## 2022-04-04 DIAGNOSIS — I5022 Chronic systolic (congestive) heart failure: Secondary | ICD-10-CM | POA: Diagnosis not present

## 2022-04-04 DIAGNOSIS — N184 Chronic kidney disease, stage 4 (severe): Secondary | ICD-10-CM | POA: Diagnosis not present

## 2022-04-04 DIAGNOSIS — Z6822 Body mass index (BMI) 22.0-22.9, adult: Secondary | ICD-10-CM | POA: Diagnosis not present

## 2022-04-05 ENCOUNTER — Telehealth: Payer: Self-pay

## 2022-04-05 NOTE — Telephone Encounter (Signed)
Pt called and left a message stating that he believes he is needing a paracentesis done. Pt is scheduled to see you on 04/18/2021.

## 2022-04-06 DIAGNOSIS — Z941 Heart transplant status: Secondary | ICD-10-CM | POA: Diagnosis not present

## 2022-04-06 DIAGNOSIS — Z48298 Encounter for aftercare following other organ transplant: Secondary | ICD-10-CM | POA: Diagnosis not present

## 2022-04-06 NOTE — Telephone Encounter (Signed)
He needs to be seen earlier. [facility] and I have seen him as outpatient. He was then inpatient and transferred to [facility]. We need an opening this upcoming week to evaluate him. Please look at all schedules to see what is available. He is complicated, and it would not be wise to order a para in this situation sight unseen.

## 2022-04-10 ENCOUNTER — Telehealth: Payer: Self-pay

## 2022-04-10 ENCOUNTER — Encounter (HOSPITAL_COMMUNITY): Payer: Self-pay

## 2022-04-10 ENCOUNTER — Encounter: Payer: Self-pay | Admitting: Gastroenterology

## 2022-04-10 ENCOUNTER — Ambulatory Visit (HOSPITAL_COMMUNITY)
Admission: RE | Admit: 2022-04-10 | Discharge: 2022-04-10 | Disposition: A | Discharge status: Home / Self Care | Payer: Medicare Other | Source: Ambulatory Visit | Attending: Gastroenterology | Admitting: Gastroenterology

## 2022-04-10 ENCOUNTER — Ambulatory Visit (INDEPENDENT_AMBULATORY_CARE_PROVIDER_SITE_OTHER): Payer: Medicare Other | Admitting: Gastroenterology

## 2022-04-10 ENCOUNTER — Emergency Department (HOSPITAL_COMMUNITY)
Admission: EM | Admit: 2022-04-10 | Discharge: 2022-04-10 | Discharge status: Against Medical Advice | Payer: Medicare Other

## 2022-04-10 VITALS — BP 113/70 | HR 88 | Temp 98.2°F | Resp 18

## 2022-04-10 VITALS — BP 138/89 | HR 90 | Temp 97.5°F | Ht 71.0 in | Wt 170.0 lb

## 2022-04-10 DIAGNOSIS — K7460 Unspecified cirrhosis of liver: Secondary | ICD-10-CM | POA: Insufficient documentation

## 2022-04-10 DIAGNOSIS — K7290 Hepatic failure, unspecified without coma: Secondary | ICD-10-CM | POA: Insufficient documentation

## 2022-04-10 DIAGNOSIS — R188 Other ascites: Secondary | ICD-10-CM | POA: Insufficient documentation

## 2022-04-10 DIAGNOSIS — Z941 Heart transplant status: Secondary | ICD-10-CM | POA: Diagnosis not present

## 2022-04-10 LAB — BODY FLUID CELL COUNT WITH DIFFERENTIAL
Eos, Fluid: 0 %
Lymphs, Fluid: 56 %
Monocyte-Macrophage-Serous Fluid: 32 % — ABNORMAL LOW (ref 50–90)
Neutrophil Count, Fluid: 12 % (ref 0–25)
Total Nucleated Cell Count, Fluid: 142 cu mm (ref 0–1000)

## 2022-04-10 LAB — GRAM STAIN

## 2022-04-10 MED ORDER — DEXLANSOPRAZOLE 60 MG PO CPDR: 60.0000 mg | DELAYED_RELEASE_CAPSULE | Freq: Every day | ORAL | 3 refills | Status: DC

## 2022-04-10 MED ORDER — ALBUMIN HUMAN 25 % IV SOLN
INTRAVENOUS | Status: AC
  Administered 2022-04-10: 25 g via INTRAVENOUS
  Filled 2022-04-10: qty 100

## 2022-04-10 MED ORDER — ESOMEPRAZOLE MAGNESIUM 40 MG PO CPDR: 40.0000 mg | DELAYED_RELEASE_CAPSULE | Freq: Two times a day (BID) | ORAL | 5 refills | Status: DC

## 2022-04-10 MED ORDER — ALBUMIN HUMAN 25 % IV SOLN: 25.0000 g | Freq: Once | INTRAVENOUS | Status: AC

## 2022-04-10 NOTE — Telephone Encounter (Signed)
[facility] phoned advising the pt's Rx is too expensive ($100.00). pt requested a different Rx. also pt has been on Nexium before and it is covered and at a lower cost. Please advise

## 2022-04-10 NOTE — Patient Instructions (Signed)
We are arranging a paracentesis as soon as possible.  I would like to have you come back in about 6 weeks.  Please call or message if you feel you need additional paras!  I enjoyed seeing you again today! As you know, I value our relationship and want to provide genuine, compassionate, and quality care. I welcome your feedback. If you receive a survey regarding your visit,  I greatly appreciate you taking time to fill this out. See you next time!  Annitta Needs, PhD, ANP-BC [facility]

## 2022-04-10 NOTE — Addendum Note (Signed)
Addended by: Annitta Needs on: 04/10/2022 02:52 PM   Modules accepted: Orders

## 2022-04-10 NOTE — Progress Notes (Addendum)
Gastroenterology Office Note     Primary Care Physician:  Monico Blitz, MD  Primary Gastroenterologist: Dr. Jenetta Downer    Chief Complaint   Chief Complaint  Patient presents with   [name] follow up     History of Present Illness   Patrick Humphrey is a 57 y.o. male presenting today in follow-up with a history of nonischemic cardiomyopathy s/p AICD, LVAD (7893 complicated by RV failure, Afib/flutter) who underwent heart transplant 2018, recent diagnosis of cirrhosis with recurrent ascites, CKD, and recent hospitalization at [facility] after transfer from [facility] due to decompensated cirrhosis, worsening renal failure and concern for HRS. In light of his complicated medical history, he was transferred to [facility]. He is here for hospital follow-up due to need for para.   Has upcoming [facility] appt on 1/2. Nephrology feels Creatinine around 4-4.5 may be new baseline. Renal transplant appt upcoming. Renal clinic appt 1/18  Para 12/15 with 3 liters removed Para 11/21 with 3.3 liters removed   Cirrhosis felt multifactorial in etiology. 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 No ETOH currently.    12/29: Creatinine 4.07, similar to discharge (4.5).   Having some lower extremity and pedal edema. Abdomen is full and tight.   Pantoprazole BID feels like it's not helping. Has done Nexium in the past.   No jaundice, pruritus, mental status changes, or confusion.   Colonoscopy Nov 2023: rectal varices, one 6 mm polyp (tubular adenoma)    EGD nov 2023: gastritis s/p biopsy, portal gastropathy. Negative H.pylori.    Past Medical History:  Diagnosis Date   Abdominal distension    AICD (automatic cardioverter/defibrillator) present    removed after heart transplant surgery   Ankle  edema, bilateral    1-2 +   CHF (congestive heart failure) ([place])    before heart transplant surgery   Cirrhosis ([place])    Hx of echocardiogram 11/09/1910   Showed an EF of 25% with no significant valve disease.   ICD (implantable cardioverter-defibrillator), single, in situ 08/26/2007   removed after heart transplant surgery   Nonischemic cardiomyopathy [facility])    before heart transplant surgery    Past Surgical History:  Procedure Laterality Date   APPENDECTOMY     BIOPSY  02/22/2022   Procedure: BIOPSY;  Surgeon: Eloise Harman, DO;  [facility];  Service: Gastroenterology;;   CARDIAC CATHETERIZATION  2002   Normal coronary arteries   CARDIAC CATHETERIZATION N/A 05/04/2015   Procedure: Right/Left Heart Cath and Coronary Angiography;  Surgeon: Larey Dresser, MD;  Location: [place] CV LAB;  Service: Cardiovascular;  Laterality: N/A;   CARDIAC CATHETERIZATION N/A 05/08/2015   Procedure: Right Heart Cath;  Surgeon: Jolaine Artist, MD;  Location: [place] CV [facility];  Service: Cardiovascular;  Laterality: N/A;   CARDIAC CATHETERIZATION N/A 05/08/2015   Procedure: IABP Insertion;  Surgeon: Jolaine Artist, MD;  Location: [place] CV LAB;  Service: Cardiovascular;  Laterality: N/A;   CARDIOVERSION N/A 05/18/2015   Procedure: CARDIOVERSION WITH TEE;  Surgeon: Larey Dresser, MD;  Location: [place];  Service: Cardiovascular;  Laterality: N/A;   CHOLECYSTECTOMY     COLONOSCOPY WITH PROPOFOL N/A 02/22/2022   Procedure: COLONOSCOPY WITH PROPOFOL;  Surgeon: Eloise Harman, DO;  [facility];  Service: Gastroenterology;  Laterality:  N/A;  105 ASA 3, pt knows to arrive at 11:00   ESOPHAGOGASTRODUODENOSCOPY (EGD) WITH PROPOFOL N/A 02/22/2022   Procedure: ESOPHAGOGASTRODUODENOSCOPY (EGD) WITH PROPOFOL;  Surgeon: Eloise Harman, DO;  [facility];  Service: Gastroenterology;  Laterality: N/A;   HEART TRANSPLANT     INSERTION OF IMPLANTABLE LEFT  VENTRICULAR ASSIST DEVICE N/A 05/10/2015   Procedure: INSERTION OF IMPLANTABLE LEFT VENTRICULAR ASSIST DEVICE;  Surgeon: Ivin Poot, MD;  Location: [place];  Service: Open Heart Surgery;  Laterality: N/A;  CIRC ARREST  NITRIC OXIDE   PACEMAKER INSERTION  08/26/2007   place by Dr. Alroy Dust at [facility]   POLYPECTOMY  02/22/2022   Procedure: POLYPECTOMY;  Surgeon: Eloise Harman, DO;  [facility];  Service: Gastroenterology;;   removal of ICD     04/2016   TEE WITHOUT CARDIOVERSION N/A 05/10/2015   Procedure: TRANSESOPHAGEAL ECHOCARDIOGRAM (TEE);  Surgeon: Ivin Poot, MD;  Location: [place];  Service: Open Heart Surgery;  Laterality: N/A;    Current Outpatient Medications  Medication Sig Dispense Refill   ALPRAZolam (XANAX) 0.5 MG tablet Take 0.5 mg by mouth daily as needed for anxiety.     cetirizine (ZYRTEC) 10 MG tablet Take 10 mg by mouth daily.     dexlansoprazole (DEXILANT) 60 MG capsule Take 1 capsule (60 mg total) by mouth daily. 90 capsule 3   fluticasone (FLONASE) 50 MCG/ACT nasal spray Place 2 sprays into both nostrils daily.      mycophenolate (MYFORTIC) 360 MG TBEC EC tablet Take 360 mg by mouth 2 (two) times daily.     olopatadine (PATADAY) 0.1 % ophthalmic solution Place 1 drop into both eyes daily as needed (Red or itching eyes).     pantoprazole (PROTONIX) 40 MG tablet Take 40 mg by mouth 2 (two) times daily.      predniSONE (DELTASONE) 2.5 MG tablet Take 2.5 mg by mouth daily.     sodium bicarbonate 650 MG tablet Take 1 tablet (650 mg total) by mouth 2 (two) times daily.     tacrolimus (PROGRAF) 1 MG capsule Take 2-3 mg by mouth See admin instructions. Take 3 mg in the morning  and 2 mg at night     tamsulosin (FLOMAX) 0.4 MG CAPS capsule Take 0.4 mg by mouth daily.     esomeprazole (NEXIUM) 40 MG capsule Take 1 capsule (40 mg total) by mouth 2 (two) times daily before a meal. 60 capsule 5   No current facility-administered medications for this  visit.    Allergies as of 04/10/2022 - Review Complete 04/10/2022  Allergen Reaction Noted   Penicillins  03/05/2012   Propranolol Other (See Comments) 02/02/2022   Sulfa antibiotics  03/05/2012    Family History  Problem Relation Age of Onset   Hypertension Mother    Hypertension Father    Cancer - Prostate Maternal Grandfather     Social History   Socioeconomic History   Marital status: Married    Spouse name: Not on file   Number of children: Not on file   Years of education: Not on file   Highest education level: Not on file  Occupational History   Not on file  Tobacco Use   Smoking status: Former    Packs/day: 1.00    Years: 5.00    Total pack years: 5.00    Types: Cigarettes    Quit date: 02/20/1998    Years since quitting: 24.1    Passive exposure: Current  Smokeless tobacco: Never  Vaping Use   Vaping Use: Never used  Substance and Sexual Activity   Alcohol use: Not Currently    Comment: has quit drinking x 1 1/2 months. He sporadically drinks   Drug use: No   Sexual activity: Yes  Other Topics Concern   Not on file  Social History Narrative   Not on file   Social Determinants of Health   Financial Resource Strain: Not on file  Food Insecurity: No Food Insecurity (03/01/2022)   Hunger Vital Sign    Worried About Running Out of Food in the Last Year: Never true    Ran Out of Food in the Last Year: Never true  Transportation Needs: No Transportation Needs (03/01/2022)   PRAPARE - Hydrologist (Medical): No    Lack of Transportation (Non-Medical): No  Physical Activity: Not on file  Stress: Not on file  Social Connections: Not on file  Intimate Partner Violence: Not At Risk (03/01/2022)   Humiliation, Afraid, Rape, and Kick questionnaire    Fear of Current or Ex-Partner: No    Emotionally Abused: No    Physically Abused: No    Sexually Abused: No     Review of Systems   See HPI   Physical Exam   BP 138/89    Pulse 90   Temp (!) 97.5 F (36.4 C)   Ht '5\' 11"'$  (1.803 m)   Wt 170 lb (77.1 kg)   BMI 23.71 kg/m  General:   Alert and oriented. Pleasant and cooperative. Chronically ill-appearing  Head:  Normocephalic and atraumatic. Eyes:  Without icterus Abdomen:  +BS, full and distended, no rebound or guarding  Rectal:  Deferred  Msk:  Symmetrical without gross deformities. Normal posture. Extremities:  With pedal edema  Neurologic:  Alert and  oriented x4;  grossly normal neurologically. Skin:  Intact without significant lesions or rashes. Psych:  Alert and cooperative. Normal mood and affect.  Lab Results  Component Value Date   ALT 22 03/20/2022   AST 30 03/20/2022   ALKPHOS 79 03/20/2022   BILITOT 1.2 03/20/2022   Lab Results  Component Value Date   INR 1.1 03/20/2022   INR 1.2 03/01/2022   INR 3.0 04/10/2016   Lab Results  Component Value Date   CREATININE 4.13 (H) 03/20/2022   BUN 44 (H) 03/20/2022   NA 140 03/20/2022   K 4.2 03/20/2022   CL 112 (H) 03/20/2022   CO2 20 (L) 03/20/2022   Jan 2024 creatinine 3.72     Assessment   Patrick Humphrey is a 56 y.o. male presenting today in follow-up with a history of nonischemic cardiomyopathy s/p AICD, LVAD (6568 complicated by RV failure, Afib/flutter) who underwent heart transplant 2018, recent diagnosis of cirrhosis with recurrent ascites, CKD, and recent hospitalization at [facility] after transfer from [facility] due to decompensated cirrhosis, worsening renal failure and concern for HRS. In light of his complicated medical history, he was transferred to [facility] He is here for hospital follow-up due to need for para.    Cirrhosis: MELD 3.0 was 22 on 12/15. Cirrhosis felt multifactorial in setting of congestive hepatopathy, hepatic steatosis, prior ETOH. He is not consuming any ETOH currently and has not in quite some time. He has had recurrent tense ascites and needs para. Most recent creatinine 3.72, which is near his new  "baseline". Nephrology had evaluated during at admission at [facility], and he has upcoming renal transplant appt with close serial monitoring  of labs. [facility] transplant on 1/2 upcoming. Will arrange para but limit at only 4 liters; he will need Albumin at onset of para as well. Need to ensure we are cautious with paras as I am concerned about worsening renal status.   He will need close follow-up at [facility] with Hepatology and Nephrology. We can assist with local needs here such as paras. Appreciate [facility] following.     PLAN    [place] para with no more than 4 liters removed, Albumin 25 g IV at start Continue serial monitoring of labs with Nephrology Keep upcoming [facility] appts 6 week return   Annitta Needs, PhD, ANP-BC [facility]    I have reviewed the note and agree with the APP's assessment as described in this progress note  Management options for third spacing limited given renal function due to possible HRS. Will continue him with paracentesis intermittently, may require weekly paras depending on clinical status but will limit removal of fluid to 4 L qday.  Needs to follow up with [facility]  Maylon Peppers, MD Gastroenterology and Hepatology [facility]enter[facility]

## 2022-04-10 NOTE — Telephone Encounter (Signed)
Pt sent a Mychart message

## 2022-04-10 NOTE — ED Triage Notes (Signed)
Registration notified triage nurse that pt has been trying to get in touch with his kidney doctor all weekend and he just now finally received a message from them that he has an appt this morning at 1100 so he going to leave the ED and go see them.

## 2022-04-10 NOTE — Progress Notes (Signed)
Paracentesis complete no signs of distress. 25G IV Albumin given.

## 2022-04-10 NOTE — Telephone Encounter (Signed)
Ok. I sent in Nexium to take BID. Please let patient know. Thanks!

## 2022-04-12 LAB — PATHOLOGIST SMEAR REVIEW

## 2022-04-15 LAB — CULTURE, BODY FLUID W GRAM STAIN -BOTTLE: Culture: NO GROWTH

## 2022-04-16 DIAGNOSIS — Z941 Heart transplant status: Secondary | ICD-10-CM | POA: Diagnosis not present

## 2022-04-18 ENCOUNTER — Ambulatory Visit: Payer: Medicare Other | Admitting: Gastroenterology

## 2022-04-18 ENCOUNTER — Other Ambulatory Visit: Payer: Self-pay | Admitting: *Deleted

## 2022-04-18 DIAGNOSIS — R188 Other ascites: Secondary | ICD-10-CM

## 2022-04-18 NOTE — Telephone Encounter (Signed)
Mindy and Tammy:  Please arrange [place] para for patient, NO MORE THAN 4 liters. Needs Albumin 25 g IV AT START. Cell count only needed.   I reviewed recent labs. Creatinine 3.09, at his baseline and actually improved from 3.72 a week ago.

## 2022-04-19 ENCOUNTER — Encounter (HOSPITAL_COMMUNITY): Payer: Self-pay

## 2022-04-19 ENCOUNTER — Ambulatory Visit (HOSPITAL_COMMUNITY)
Admission: RE | Admit: 2022-04-19 | Discharge: 2022-04-19 | Disposition: A | Discharge status: Home / Self Care | Payer: Medicare Other | Source: Ambulatory Visit | Attending: Gastroenterology | Admitting: Gastroenterology

## 2022-04-19 DIAGNOSIS — R188 Other ascites: Secondary | ICD-10-CM | POA: Diagnosis not present

## 2022-04-19 DIAGNOSIS — Z48298 Encounter for aftercare following other organ transplant: Secondary | ICD-10-CM | POA: Diagnosis not present

## 2022-04-19 DIAGNOSIS — K7460 Unspecified cirrhosis of liver: Secondary | ICD-10-CM | POA: Diagnosis not present

## 2022-04-19 LAB — BODY FLUID CELL COUNT WITH DIFFERENTIAL
Lymphs, Fluid: 47 %
Monocyte-Macrophage-Serous Fluid: 50 % (ref 50–90)
Neutrophil Count, Fluid: 3 % (ref 0–25)
Total Nucleated Cell Count, Fluid: 117 cu mm (ref 0–1000)

## 2022-04-19 MED ORDER — ALBUMIN HUMAN 25 % IV SOLN
0.0000 g | Freq: Once | INTRAVENOUS | Status: AC
  Filled 2022-04-19: qty 400

## 2022-04-19 MED ORDER — ALBUMIN HUMAN 25 % IV SOLN
INTRAVENOUS | Status: AC
  Administered 2022-04-19: 25 g via INTRAVENOUS
  Filled 2022-04-19: qty 100

## 2022-04-19 NOTE — Progress Notes (Signed)
Patient tolerated right sided paracentesis procedure and 25G of IV albumin well today and 4 Liters of cloudy yellow ascites drained with labs collected and sent for processing. Pt verbalized understanding of discharge instructions and ambulatory at departure with no acute distress noted.

## 2022-04-19 NOTE — Procedures (Signed)
  GI note 04/10/22 Cirrhosis felt multifactorial in etiology. 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 No ETOH currently.   US guided RLQ paracentesis 4 liters (max per MD) Cloudy yellow fluid- sent for labs per MD  Tolerated well  EBL: none

## 2022-04-20 DIAGNOSIS — I480 Paroxysmal atrial fibrillation: Secondary | ICD-10-CM | POA: Diagnosis not present

## 2022-04-20 DIAGNOSIS — I5022 Chronic systolic (congestive) heart failure: Secondary | ICD-10-CM | POA: Diagnosis not present

## 2022-04-20 DIAGNOSIS — D84821 Immunodeficiency due to drugs: Secondary | ICD-10-CM | POA: Diagnosis not present

## 2022-04-20 DIAGNOSIS — J069 Acute upper respiratory infection, unspecified: Secondary | ICD-10-CM | POA: Diagnosis not present

## 2022-04-24 DIAGNOSIS — Z48298 Encounter for aftercare following other organ transplant: Secondary | ICD-10-CM | POA: Diagnosis not present

## 2022-04-24 DIAGNOSIS — Z941 Heart transplant status: Secondary | ICD-10-CM | POA: Diagnosis not present

## 2022-04-24 DIAGNOSIS — Z9225 Personal history of immunosupression therapy: Secondary | ICD-10-CM | POA: Diagnosis not present

## 2022-04-24 NOTE — Telephone Encounter (Signed)
Mindy/Tammy:  Please arrange para with no more than 4 liters removed. Needs 25 grams IV albumin at onset.

## 2022-04-25 ENCOUNTER — Encounter (HOSPITAL_COMMUNITY): Payer: Self-pay

## 2022-04-25 ENCOUNTER — Ambulatory Visit (HOSPITAL_COMMUNITY)
Admission: RE | Admit: 2022-04-25 | Discharge: 2022-04-25 | Disposition: A | Discharge status: Home / Self Care | Payer: Medicare Other | Source: Ambulatory Visit | Attending: Gastroenterology | Admitting: Gastroenterology

## 2022-04-25 ENCOUNTER — Other Ambulatory Visit (INDEPENDENT_AMBULATORY_CARE_PROVIDER_SITE_OTHER): Payer: Self-pay | Admitting: *Deleted

## 2022-04-25 DIAGNOSIS — R188 Other ascites: Secondary | ICD-10-CM | POA: Insufficient documentation

## 2022-04-25 DIAGNOSIS — K7460 Unspecified cirrhosis of liver: Secondary | ICD-10-CM | POA: Diagnosis not present

## 2022-04-25 LAB — BODY FLUID CELL COUNT WITH DIFFERENTIAL
Eos, Fluid: 0 %
Lymphs, Fluid: 38 %
Monocyte-Macrophage-Serous Fluid: 53 % (ref 50–90)
Neutrophil Count, Fluid: 9 % (ref 0–25)
Total Nucleated Cell Count, Fluid: 188 cu mm (ref 0–1000)

## 2022-04-25 MED ORDER — ALBUMIN HUMAN 25 % IV SOLN
0.0000 g | Freq: Once | INTRAVENOUS | Status: AC
  Filled 2022-04-25: qty 400

## 2022-04-25 MED ORDER — ALBUMIN HUMAN 25 % IV SOLN
INTRAVENOUS | Status: AC
  Administered 2022-04-25: 25 g via INTRAVENOUS
  Filled 2022-04-25: qty 100

## 2022-04-25 NOTE — Procedures (Signed)
   US guided RLQ paracentesis  4 liters (max per MD) cloudy yellow fluid obtained Sent for labs per MD  Tolerated well  EBL: none

## 2022-04-25 NOTE — Telephone Encounter (Signed)
Pt aware of PARA appt today. Pt wants to know if you received his blood work results yet

## 2022-04-25 NOTE — Progress Notes (Signed)
Patient tolerated right sided paracentesis and 25G of IV albumin well today and 4 Liters of ascites removed with labs collected and sent for processing. Pt verbalized understanding of discharge instructions and ambulatory at departure with no acute distress noted.

## 2022-04-26 DIAGNOSIS — Z941 Heart transplant status: Secondary | ICD-10-CM | POA: Diagnosis not present

## 2022-04-26 DIAGNOSIS — R918 Other nonspecific abnormal finding of lung field: Secondary | ICD-10-CM | POA: Diagnosis not present

## 2022-04-26 DIAGNOSIS — R188 Other ascites: Secondary | ICD-10-CM | POA: Diagnosis not present

## 2022-04-26 DIAGNOSIS — I132 Hypertensive heart and chronic kidney disease with heart failure and with stage 5 chronic kidney disease, or end stage renal disease: Secondary | ICD-10-CM | POA: Diagnosis not present

## 2022-04-26 DIAGNOSIS — N185 Chronic kidney disease, stage 5: Secondary | ICD-10-CM | POA: Diagnosis not present

## 2022-04-26 DIAGNOSIS — Z48298 Encounter for aftercare following other organ transplant: Secondary | ICD-10-CM | POA: Diagnosis not present

## 2022-04-26 DIAGNOSIS — R053 Chronic cough: Secondary | ICD-10-CM | POA: Diagnosis not present

## 2022-04-26 DIAGNOSIS — E1122 Type 2 diabetes mellitus with diabetic chronic kidney disease: Secondary | ICD-10-CM | POA: Diagnosis not present

## 2022-04-26 DIAGNOSIS — K7460 Unspecified cirrhosis of liver: Secondary | ICD-10-CM | POA: Diagnosis not present

## 2022-04-26 DIAGNOSIS — Z23 Encounter for immunization: Secondary | ICD-10-CM | POA: Diagnosis not present

## 2022-04-26 DIAGNOSIS — D849 Immunodeficiency, unspecified: Secondary | ICD-10-CM | POA: Diagnosis not present

## 2022-05-01 DIAGNOSIS — Z1159 Encounter for screening for other viral diseases: Secondary | ICD-10-CM | POA: Diagnosis not present

## 2022-05-01 DIAGNOSIS — R188 Other ascites: Secondary | ICD-10-CM | POA: Diagnosis not present

## 2022-05-01 DIAGNOSIS — Z01818 Encounter for other preprocedural examination: Secondary | ICD-10-CM | POA: Diagnosis not present

## 2022-05-01 DIAGNOSIS — N185 Chronic kidney disease, stage 5: Secondary | ICD-10-CM | POA: Diagnosis not present

## 2022-05-01 DIAGNOSIS — Z79899 Other long term (current) drug therapy: Secondary | ICD-10-CM | POA: Diagnosis not present

## 2022-05-01 DIAGNOSIS — K7460 Unspecified cirrhosis of liver: Secondary | ICD-10-CM | POA: Diagnosis not present

## 2022-05-01 DIAGNOSIS — K766 Portal hypertension: Secondary | ICD-10-CM | POA: Diagnosis not present

## 2022-05-03 ENCOUNTER — Ambulatory Visit (INDEPENDENT_AMBULATORY_CARE_PROVIDER_SITE_OTHER): Payer: Medicare Other | Admitting: Gastroenterology

## 2022-05-04 DIAGNOSIS — Z79624 Long term (current) use of inhibitors of nucleotide synthesis: Secondary | ICD-10-CM | POA: Diagnosis not present

## 2022-05-04 DIAGNOSIS — Z79621 Long term (current) use of calcineurin inhibitor: Secondary | ICD-10-CM | POA: Diagnosis not present

## 2022-05-04 DIAGNOSIS — Z79899 Other long term (current) drug therapy: Secondary | ICD-10-CM | POA: Diagnosis not present

## 2022-05-04 DIAGNOSIS — Z1159 Encounter for screening for other viral diseases: Secondary | ICD-10-CM | POA: Diagnosis not present

## 2022-05-04 DIAGNOSIS — Z01818 Encounter for other preprocedural examination: Secondary | ICD-10-CM | POA: Diagnosis not present

## 2022-05-04 DIAGNOSIS — I120 Hypertensive chronic kidney disease with stage 5 chronic kidney disease or end stage renal disease: Secondary | ICD-10-CM | POA: Diagnosis not present

## 2022-05-04 DIAGNOSIS — K7460 Unspecified cirrhosis of liver: Secondary | ICD-10-CM | POA: Diagnosis not present

## 2022-05-04 DIAGNOSIS — Z87891 Personal history of nicotine dependence: Secondary | ICD-10-CM | POA: Diagnosis not present

## 2022-05-04 DIAGNOSIS — N186 End stage renal disease: Secondary | ICD-10-CM | POA: Diagnosis not present

## 2022-05-04 DIAGNOSIS — Z7682 Awaiting organ transplant status: Secondary | ICD-10-CM | POA: Diagnosis not present

## 2022-05-04 DIAGNOSIS — Z7952 Long term (current) use of systemic steroids: Secondary | ICD-10-CM | POA: Diagnosis not present

## 2022-05-04 DIAGNOSIS — N184 Chronic kidney disease, stage 4 (severe): Secondary | ICD-10-CM | POA: Diagnosis not present

## 2022-05-04 DIAGNOSIS — Z941 Heart transplant status: Secondary | ICD-10-CM | POA: Diagnosis not present

## 2022-05-04 DIAGNOSIS — N179 Acute kidney failure, unspecified: Secondary | ICD-10-CM | POA: Diagnosis not present

## 2022-05-04 DIAGNOSIS — Z8679 Personal history of other diseases of the circulatory system: Secondary | ICD-10-CM | POA: Diagnosis not present

## 2022-05-04 DIAGNOSIS — Z8709 Personal history of other diseases of the respiratory system: Secondary | ICD-10-CM | POA: Diagnosis not present

## 2022-05-04 DIAGNOSIS — R188 Other ascites: Secondary | ICD-10-CM | POA: Diagnosis not present

## 2022-05-07 ENCOUNTER — Other Ambulatory Visit (INDEPENDENT_AMBULATORY_CARE_PROVIDER_SITE_OTHER): Payer: Self-pay | Admitting: *Deleted

## 2022-05-07 DIAGNOSIS — R188 Other ascites: Secondary | ICD-10-CM

## 2022-05-07 NOTE — Telephone Encounter (Signed)
Please arrange para for Wednesday or Thursday. Needs 25 g IV albumin at onset. MAX of 4 liters.

## 2022-05-09 ENCOUNTER — Ambulatory Visit (HOSPITAL_COMMUNITY)
Admission: RE | Admit: 2022-05-09 | Discharge: 2022-05-09 | Disposition: A | Discharge status: Home / Self Care | Payer: Medicare Other | Source: Ambulatory Visit | Attending: Gastroenterology | Admitting: Gastroenterology

## 2022-05-09 ENCOUNTER — Encounter (HOSPITAL_COMMUNITY): Payer: Self-pay

## 2022-05-09 DIAGNOSIS — R188 Other ascites: Secondary | ICD-10-CM | POA: Diagnosis not present

## 2022-05-09 DIAGNOSIS — K7460 Unspecified cirrhosis of liver: Secondary | ICD-10-CM | POA: Diagnosis not present

## 2022-05-09 LAB — BODY FLUID CELL COUNT WITH DIFFERENTIAL
Lymphs, Fluid: 35 %
Monocyte-Macrophage-Serous Fluid: 62 % (ref 50–90)
Neutrophil Count, Fluid: 3 % (ref 0–25)
Total Nucleated Cell Count, Fluid: 172 cu mm (ref 0–1000)

## 2022-05-09 MED ORDER — ALBUMIN HUMAN 25 % IV SOLN
INTRAVENOUS | Status: AC
  Administered 2022-05-09: 25 g via INTRAVENOUS
  Filled 2022-05-09: qty 100

## 2022-05-09 MED ORDER — ALBUMIN HUMAN 25 % IV SOLN
0.0000 g | Freq: Once | INTRAVENOUS | Status: AC
  Filled 2022-05-09: qty 400

## 2022-05-09 NOTE — Procedures (Signed)
   Cirrhosis Recurrent ascites  US guided LLQ paracentesis 4 liters yellow fluid (max per MD) Sent for labs  Tolerated well EBL: scant

## 2022-05-09 NOTE — Progress Notes (Signed)
Patient tolerated left sided paracentesis and 25G of IV albumin well today and 4 Liters of cloudy yellow ascites removed with labs collected and sent for processing. PT verbalized understanding of discharge instructions and ambulatory at departure with no acute distress noted.

## 2022-05-16 ENCOUNTER — Other Ambulatory Visit (INDEPENDENT_AMBULATORY_CARE_PROVIDER_SITE_OTHER): Payer: Self-pay | Admitting: *Deleted

## 2022-05-16 ENCOUNTER — Telehealth: Payer: Self-pay

## 2022-05-16 DIAGNOSIS — Z6822 Body mass index (BMI) 22.0-22.9, adult: Secondary | ICD-10-CM | POA: Diagnosis not present

## 2022-05-16 DIAGNOSIS — R188 Other ascites: Secondary | ICD-10-CM | POA: Diagnosis not present

## 2022-05-16 DIAGNOSIS — I10 Essential (primary) hypertension: Secondary | ICD-10-CM | POA: Diagnosis not present

## 2022-05-16 DIAGNOSIS — Z299 Encounter for prophylactic measures, unspecified: Secondary | ICD-10-CM | POA: Diagnosis not present

## 2022-05-16 DIAGNOSIS — Z87891 Personal history of nicotine dependence: Secondary | ICD-10-CM | POA: Diagnosis not present

## 2022-05-16 DIAGNOSIS — I5022 Chronic systolic (congestive) heart failure: Secondary | ICD-10-CM | POA: Diagnosis not present

## 2022-05-16 NOTE — Telephone Encounter (Signed)
The pt phoned back today for a response from his my chart messages regarding his para's. Please advise

## 2022-05-16 NOTE — Telephone Encounter (Signed)
noted 

## 2022-05-16 NOTE — Telephone Encounter (Signed)
Called central scheduling. Unable to do PARA on Friday as they have no staffing to do so. Dr. Thornton Papas said to add on for tomorrow. scheduled

## 2022-05-16 NOTE — Telephone Encounter (Signed)
Please arrange para for patient on Friday, needs 25 g IV albumin at start, no more than 4 liters. Cell count for fluid.

## 2022-05-17 ENCOUNTER — Encounter (HOSPITAL_COMMUNITY): Payer: Self-pay

## 2022-05-17 ENCOUNTER — Ambulatory Visit (HOSPITAL_COMMUNITY)
Admission: RE | Admit: 2022-05-17 | Discharge: 2022-05-17 | Disposition: A | Discharge status: Home / Self Care | Payer: Medicare Other | Source: Ambulatory Visit | Attending: Gastroenterology | Admitting: Gastroenterology

## 2022-05-17 DIAGNOSIS — R188 Other ascites: Secondary | ICD-10-CM | POA: Insufficient documentation

## 2022-05-17 LAB — BODY FLUID CELL COUNT WITH DIFFERENTIAL
Eos, Fluid: 4 %
Lymphs, Fluid: 46 %
Monocyte-Macrophage-Serous Fluid: 47 % — ABNORMAL LOW (ref 50–90)
Neutrophil Count, Fluid: 3 % (ref 0–25)
Total Nucleated Cell Count, Fluid: 172 cu mm (ref 0–1000)

## 2022-05-17 LAB — GRAM STAIN

## 2022-05-17 MED ORDER — ALBUMIN HUMAN 25 % IV SOLN: 25.0000 g | Freq: Once | INTRAVENOUS | Status: AC

## 2022-05-17 MED ORDER — ALBUMIN HUMAN 25 % IV SOLN
INTRAVENOUS | Status: AC
  Administered 2022-05-17: 25 g via INTRAVENOUS
  Filled 2022-05-17: qty 100

## 2022-05-17 NOTE — Procedures (Signed)
   US guided RLQ paracentesis  4 Liters max per MD Sent for labs Tolerated well  EBL: none

## 2022-05-17 NOTE — Progress Notes (Signed)
Paracentesis complete no signs of distress, 25G Albumin given IV. 4L ascites removed.

## 2022-05-17 NOTE — Telephone Encounter (Signed)
Already addressed

## 2022-05-18 DIAGNOSIS — Z79899 Other long term (current) drug therapy: Secondary | ICD-10-CM | POA: Diagnosis not present

## 2022-05-22 DIAGNOSIS — Z941 Heart transplant status: Secondary | ICD-10-CM | POA: Diagnosis not present

## 2022-05-22 DIAGNOSIS — K766 Portal hypertension: Secondary | ICD-10-CM | POA: Diagnosis not present

## 2022-05-22 DIAGNOSIS — Z79899 Other long term (current) drug therapy: Secondary | ICD-10-CM | POA: Diagnosis not present

## 2022-05-22 DIAGNOSIS — Z1159 Encounter for screening for other viral diseases: Secondary | ICD-10-CM | POA: Diagnosis not present

## 2022-05-22 DIAGNOSIS — D84821 Immunodeficiency due to drugs: Secondary | ICD-10-CM | POA: Diagnosis not present

## 2022-05-22 DIAGNOSIS — E782 Mixed hyperlipidemia: Secondary | ICD-10-CM | POA: Diagnosis not present

## 2022-05-22 DIAGNOSIS — R0602 Shortness of breath: Secondary | ICD-10-CM | POA: Diagnosis not present

## 2022-05-22 DIAGNOSIS — Z01818 Encounter for other preprocedural examination: Secondary | ICD-10-CM | POA: Diagnosis not present

## 2022-05-22 DIAGNOSIS — Z01810 Encounter for preprocedural cardiovascular examination: Secondary | ICD-10-CM | POA: Diagnosis not present

## 2022-05-22 DIAGNOSIS — I8510 Secondary esophageal varices without bleeding: Secondary | ICD-10-CM | POA: Diagnosis not present

## 2022-05-22 DIAGNOSIS — Z87891 Personal history of nicotine dependence: Secondary | ICD-10-CM | POA: Diagnosis not present

## 2022-05-22 DIAGNOSIS — N186 End stage renal disease: Secondary | ICD-10-CM | POA: Diagnosis not present

## 2022-05-22 DIAGNOSIS — I120 Hypertensive chronic kidney disease with stage 5 chronic kidney disease or end stage renal disease: Secondary | ICD-10-CM | POA: Diagnosis not present

## 2022-05-22 DIAGNOSIS — Z01811 Encounter for preprocedural respiratory examination: Secondary | ICD-10-CM | POA: Diagnosis not present

## 2022-05-22 LAB — CULTURE, BODY FLUID W GRAM STAIN -BOTTLE: Culture: NO GROWTH

## 2022-05-23 DIAGNOSIS — Z2821 Immunization not carried out because of patient refusal: Secondary | ICD-10-CM | POA: Diagnosis not present

## 2022-05-23 DIAGNOSIS — Z7682 Awaiting organ transplant status: Secondary | ICD-10-CM | POA: Diagnosis not present

## 2022-05-23 DIAGNOSIS — Z008 Encounter for other general examination: Secondary | ICD-10-CM | POA: Diagnosis not present

## 2022-05-24 DIAGNOSIS — Z941 Heart transplant status: Secondary | ICD-10-CM | POA: Diagnosis not present

## 2022-05-24 DIAGNOSIS — E782 Mixed hyperlipidemia: Secondary | ICD-10-CM | POA: Diagnosis not present

## 2022-05-24 DIAGNOSIS — N186 End stage renal disease: Secondary | ICD-10-CM | POA: Diagnosis not present

## 2022-05-24 DIAGNOSIS — Z01810 Encounter for preprocedural cardiovascular examination: Secondary | ICD-10-CM | POA: Diagnosis not present

## 2022-05-24 DIAGNOSIS — I10 Essential (primary) hypertension: Secondary | ICD-10-CM | POA: Diagnosis not present

## 2022-05-24 DIAGNOSIS — I120 Hypertensive chronic kidney disease with stage 5 chronic kidney disease or end stage renal disease: Secondary | ICD-10-CM | POA: Diagnosis not present

## 2022-05-24 DIAGNOSIS — Z87891 Personal history of nicotine dependence: Secondary | ICD-10-CM | POA: Diagnosis not present

## 2022-05-24 DIAGNOSIS — Z01818 Encounter for other preprocedural examination: Secondary | ICD-10-CM | POA: Diagnosis not present

## 2022-05-24 DIAGNOSIS — R0602 Shortness of breath: Secondary | ICD-10-CM | POA: Diagnosis not present

## 2022-05-28 ENCOUNTER — Encounter (INDEPENDENT_AMBULATORY_CARE_PROVIDER_SITE_OTHER): Payer: Self-pay | Admitting: Gastroenterology

## 2022-05-28 ENCOUNTER — Ambulatory Visit (INDEPENDENT_AMBULATORY_CARE_PROVIDER_SITE_OTHER): Payer: Medicare Other | Admitting: Gastroenterology

## 2022-05-28 VITALS — BP 125/86 | HR 86 | Temp 97.7°F | Ht 71.0 in | Wt 156.7 lb

## 2022-05-28 DIAGNOSIS — K7290 Hepatic failure, unspecified without coma: Secondary | ICD-10-CM

## 2022-05-28 DIAGNOSIS — K7460 Unspecified cirrhosis of liver: Secondary | ICD-10-CM | POA: Diagnosis not present

## 2022-05-28 DIAGNOSIS — R188 Other ascites: Secondary | ICD-10-CM

## 2022-05-28 NOTE — Progress Notes (Signed)
Patrick Humphrey, M.D. Gastroenterology & Hepatology [facility] [address] [place], [place] [address]  Primary Care Physician: Patrick Humphrey, [place] [address]  I will communicate my assessment and recommendations to the referring MD via EMR.  Problems: Alcoholic and congestive hepatopathy cirrhosis Recurrent ascites requiring frequent paracentesis Possible type II HRS   History of Present Illness: Patrick Humphrey is a 56 y.o. male with past medical history of of nonischemic cardiomyopathy s/p AICD, LVAD (0000000 complicated by RV failure, Afib/flutter) s/p heart transplant [id number], alcoholic and congestive hepatopathy cirrhosis with recurrent ascites requiring repeat paracentesis, CKD, who presents for follow up of liver cirrhosis.  The patient was last seen on 04/10/2022. At that time, the patient was advised to have a repeat paracentesis and to follow-up with nephrology given worsening CKD.  Since the last time he was seen in clinic he has required recurrent paracenteses every 10 to 14 days.Last paracentesis was last Thursday. States that he feels with the diuretics he may have but a few more days until repeat paracentesis, as he has required it every 14 days instead of every 9 days.  Patient reports that he does not have any complaints at the moment, althoguht he has some discomfort in the epigastric area - this is especially worse when he has abdominal distention due to the ascites. The patient denies having any nausea, vomiting, fever, chills, hematochezia, melena, hematemesis, abdominal distention, abdominal pain, diarrhea, jaundice, pruritus or weight loss.  Patient was transferred from [facility] health to [facility] in late 2023 as he was presenting with decompensated cirrhosis with ascites.  Since then, he has been evaluated by the transplant hepatology team (Patrick Humphrey).  It was considered that his cirrhosis was related to alcohol  consumption and possible congestive hepatopathy.  TIPS was not considered an option for now as the patient is not actively listed for liver transplant and his MELD score was elevated.  Given this and his CKD, he has been kept on low-dose diuretics and has required intermittent paracentesis.  He has been continued on Lasix 10 mg twice daily and spironolactone 50 mg daily, which has been managed by transplant hepatology and transplant nephrology, since he is being considered for a double transplant. He was advised to start taking carvedilol 6.25 mg twice daily as well, which he is currently taking compliantly.  He is currently undergoing evaluation for possible listing -states that his case will be discussed tomorrow in the liver transplant multidisciplinary conference.  Most recent labs from 05/22/2022 showed a hemoglobin of 12.1, platelets 212, WBC 4.6, CMP with creatinine 2.5, BUN 52, sodium 139, potassium 4.3, AST 31, ALT 21, albumin 3.2, alkaline phosphatase 96, total bili 0.5, positive antibody against hepatitis A and B surface antibodies.  AFP was 5.1, ANA was positive and ASMA was negative, INR was 1.0  Cirrhosis related questions: Hematemesis/coffee ground emesis: No Abdominal pain: No Abdominal distention/worsening ascitesYes Fever/chills: No Episodes of confusion/disorientation: No Number of daily bowel movements:1-2 times a day Taking diuretics?: Yest, furosemide 20 mg BID , spironolactone 50 mg qday History of variceal bleeding: No Prior history of banding?: No Prior episodes of SBP: No Last time liver imaging was performed:03/03/22-  US doppler, adequate flow, no masses but presence of moderate ascites Last AFP: 05/22/22 - 5.1 MELD 3.0 score: 05/22/22  - 16 Consuming alcohol: No  Colonoscopy Nov 2023: rectal varices, one 6 mm polyp (tubular adenoma)    EGD nov 2023: gastritis s/p biopsy, portal  gastropathy. Negative H.pylori.   Past Medical History: Past Medical History:  Diagnosis  Date   Abdominal distension    AICD (automatic cardioverter/defibrillator) present    removed after heart transplant surgery   Ankle edema, bilateral    1-2 +   CHF (congestive heart failure) ([place])    before heart transplant surgery   Cirrhosis ([place])    Hx of echocardiogram 11/09/1910   Showed an EF of 25% with no significant valve disease.   ICD (implantable cardioverter-defibrillator), single, in situ 08/26/2007   removed after heart transplant surgery   Nonischemic cardiomyopathy [facility])    before heart transplant surgery    Past Surgical History: Past Surgical History:  Procedure Laterality Date   APPENDECTOMY     BIOPSY  02/22/2022   Procedure: BIOPSY;  Surgeon: Patrick Harman, DO;  [facility];  Service: Gastroenterology;;   CARDIAC CATHETERIZATION  2002   Normal coronary arteries   CARDIAC CATHETERIZATION N/A 05/04/2015   Procedure: Right/Left Heart Cath and Coronary Angiography;  Surgeon: Patrick Dresser, MD;  Location: [place] CV LAB;  Service: Cardiovascular;  Laterality: N/A;   CARDIAC CATHETERIZATION N/A 05/08/2015   Procedure: Right Heart Cath;  Surgeon: Patrick Artist, MD;  Location: [place] CV LAB;  Service: Cardiovascular;  Laterality: N/A;   CARDIAC CATHETERIZATION N/A 05/08/2015   Procedure: IABP Insertion;  Surgeon: Patrick Artist, MD;  Location: [place] CV LAB;  Service: Cardiovascular;  Laterality: N/A;   CARDIOVERSION N/A 05/18/2015   Procedure: CARDIOVERSION WITH TEE;  Surgeon: Patrick Dresser, MD;  Location: [place];  Service: Cardiovascular;  Laterality: N/A;   CHOLECYSTECTOMY     COLONOSCOPY WITH PROPOFOL N/A 02/22/2022   Procedure: COLONOSCOPY WITH PROPOFOL;  Surgeon: Patrick Harman, DO;  [facility];  Service: Gastroenterology;  Laterality: N/A;  105 ASA 3, pt knows to arrive at 11:00   ESOPHAGOGASTRODUODENOSCOPY (EGD) WITH PROPOFOL N/A 02/22/2022   Procedure: ESOPHAGOGASTRODUODENOSCOPY (EGD) WITH PROPOFOL;   Surgeon: Patrick Harman, DO;  [facility];  Service: Gastroenterology;  Laterality: N/A;   HEART TRANSPLANT     INSERTION OF IMPLANTABLE LEFT VENTRICULAR ASSIST DEVICE N/A 05/10/2015   Procedure: INSERTION OF IMPLANTABLE LEFT VENTRICULAR ASSIST DEVICE;  Surgeon: Patrick Poot, MD;  Location: [place];  Service: Open Heart Surgery;  Laterality: N/A;  CIRC ARREST  NITRIC OXIDE   PACEMAKER INSERTION  08/26/2007   place by Patrick. Alroy Humphrey at [facility]   POLYPECTOMY  02/22/2022   Procedure: POLYPECTOMY;  Surgeon: Patrick Harman, DO;  [facility];  Service: Gastroenterology;;   removal of ICD     04/2016   TEE WITHOUT CARDIOVERSION N/A 05/10/2015   Procedure: TRANSESOPHAGEAL ECHOCARDIOGRAM (TEE);  Surgeon: Patrick Poot, MD;  Location: [place];  Service: Open Heart Surgery;  Laterality: N/A;    Family History: Family History  Problem Relation Age of Onset   Hypertension Mother    Hypertension Father    Cancer - Prostate Maternal Grandfather     Social History: Social History   Tobacco Use  Smoking Status Former   Packs/day: 1.00   Years: 5.00   Total pack years: 5.00   Types: Cigarettes   Quit date: 02/20/1998   Years since quitting: 24.2   Passive exposure: Current  Smokeless Tobacco Never   Social History   Substance and Sexual Activity  Alcohol Use Not Currently   Comment: has quit drinking x 1 1/2 months. He sporadically drinks  Social History   Substance and Sexual Activity  Drug Use No    Allergies: Allergies  Allergen Reactions   Penicillins     Per patient he tolerated a form of PCN as an adult  Tolerates cephalexin  Has patient had a PCN reaction causing immediate rash, facial/tongue/throat swelling, SOB or lightheadedness with hypotension: Yes Has patient had a PCN reaction causing severe rash involving mucus membranes or skin necrosis: No Has patient had a PCN reaction that required hospitalization No Has patient had  a PCN reaction occurring within the last 10 years: No If all of the above answers are "NO", then may proceed with Cep   Propranolol Other (See Comments)    Nightmares, Tired, Cold hand and feet, Insomnia, Fatigue, Diminished energy, Low Libido, anxiety, poor concentration and depression   Sulfa Antibiotics     GI upset    Medications: Current Outpatient Medications  Medication Sig Dispense Refill   ALPRAZolam (XANAX) 0.5 MG tablet Take 0.5 mg by mouth daily as needed for anxiety.     cetirizine (ZYRTEC) 10 MG tablet Take 10 mg by mouth daily.     fluticasone (FLONASE) 50 MCG/ACT nasal spray Place 2 sprays into both nostrils daily.      furosemide (LASIX) 20 MG tablet Take 20 mg by mouth 2 (two) times daily.     mycophenolate (MYFORTIC) 360 MG TBEC EC tablet Take 360 mg by mouth 2 (two) times daily.     olopatadine (PATADAY) 0.1 % ophthalmic solution Place 1 drop into both eyes daily as needed (Red or itching eyes).     pantoprazole (PROTONIX) 40 MG tablet Take 40 mg by mouth 2 (two) times daily.      predniSONE (DELTASONE) 2.5 MG tablet Take 2.5 mg by mouth daily.     sodium bicarbonate 650 MG tablet Take 1 tablet (650 mg total) by mouth 2 (two) times daily.     spironolactone (ALDACTONE) 50 MG tablet Take 50 mg by mouth daily.     tacrolimus (PROGRAF) 1 MG capsule Take 2-3 mg by mouth See admin instructions. Take 3 mg in the morning  and 2 mg at night     tamsulosin (FLOMAX) 0.4 MG CAPS capsule Take 0.4 mg by mouth daily.     No current facility-administered medications for this visit.    Review of Systems: GENERAL: negative for malaise, night sweats HEENT: No changes in hearing or vision, no nose bleeds or other nasal problems. NECK: Negative for lumps, goiter, pain and significant neck swelling RESPIRATORY: Negative for cough, wheezing CARDIOVASCULAR: Negative for chest pain, leg swelling, palpitations, orthopnea GI: SEE HPI MUSCULOSKELETAL: Negative for joint pain or swelling,  back pain, and muscle pain. SKIN: Negative for lesions, rash PSYCH: Negative for sleep disturbance, mood disorder and recent psychosocial stressors. HEMATOLOGY Negative for prolonged bleeding, bruising easily, and swollen nodes. ENDOCRINE: Negative for cold or heat intolerance, polyuria, polydipsia and goiter. NEURO: negative for tremor, gait imbalance, syncope and seizures. The remainder of the review of systems is noncontributory.   Physical Exam: BP 125/86 (BP Location: Right Arm, Patient Position: Sitting, Cuff Size: Normal)   Pulse 86   Temp 97.7 F (36.5 C) (Oral)   Ht 5' 11"$  (1.803 m)   Wt 156 lb 11.2 oz (71.1 kg)   BMI 21.86 kg/m  GENERAL: The patient is AO x3, in no acute distress. HEENT: Head is normocephalic and atraumatic. EOMI are intact. Mouth is well hydrated and without lesions. NECK: Supple. No masses LUNGS: Clear  to auscultation. No presence of rhonchi/wheezing/rales. Adequate chest expansion HEART: RRR, normal s1 and s2. ABDOMEN: Soft, nontender, no guarding, no peritoneal signs, and nondistended. BS +. No masses. EXTREMITIES: Without any cyanosis, clubbing, rash, lesions or edema. NEUROLOGIC: AOx3, no focal motor deficit. SKIN: no jaundice, no rashes  Imaging/Labs: as above  I personally reviewed and interpreted the available labs, imaging and endoscopic files.  Impression and Plan: ZAMIER MESSERLI is a 57 y.o. male with past medical history of of nonischemic cardiomyopathy s/p AICD, LVAD (0000000 complicated by RV failure, Afib/flutter) s/p heart transplant [id number], alcoholic and congestive hepatopathy cirrhosis with recurrent ascites requiring repeat paracentesis, CKD, who presents for follow up of liver cirrhosis.  Patient had evidence of decompensated liver cirrhosis, he is a class B CPT with a moderate MELD score of 16.  Unfortunately he has presented recurrent episodes of ascites that have required repeat paracentesis multiple episodes.  Up titration of  diuretics has been limited by CKD.  He is currently undergoing evaluation for double kidney and liver transplant at [facility], he should continue with this management and I asked him to let us know if he gets listed.  He should avoid drinking any alcohol and continue with intermittent paracentesis when he is presenting significant abdominal distention.  He should also continue with his current dose of furosemide and spironolactone.  - Follow up with [facility] - please keep [place] updated if listed - Will schedule repeat therapeutic paracentesis (max drain of 5 L), call us if you need repeat paracentesis - Continue furosemide 20 mg BID , spironolactone 50 mg qday -Continue carvedilol 6.25 mg twice a day. - Reduce salt intake to <2 g per day - Can take Tylenol max of 2 g per day (650 mg q8h) for pain - Avoid NSAIDs for pain - Avoid eating raw oysters/shellfish - Protein shake (Ensure or Boost) every night before going to sleep  All questions were answered.      Patrick Peppers, MD Gastroenterology and Hepatology [facility] Gastroenterology

## 2022-05-28 NOTE — Patient Instructions (Addendum)
-   Follow up with [facility] - please keep [place] updated if listed - Will schedule repeat therapeutic paracentesis (max drain of 5 L), call us if you need repeat paracentesis - Continue furosemide 20 mg BID , spironolactone 50 mg qday - Reduce salt intake to <2 g per day - Can take Tylenol max of 2 g per day (650 mg q8h) for pain - Avoid NSAIDs for pain - Avoid eating raw oysters/shellfish - Protein shake (Ensure or Boost) every night before going to sleep

## 2022-05-29 ENCOUNTER — Other Ambulatory Visit (INDEPENDENT_AMBULATORY_CARE_PROVIDER_SITE_OTHER): Payer: Self-pay | Admitting: *Deleted

## 2022-05-29 ENCOUNTER — Other Ambulatory Visit: Payer: Self-pay | Admitting: *Deleted

## 2022-05-29 DIAGNOSIS — R188 Other ascites: Secondary | ICD-10-CM

## 2022-05-29 NOTE — Telephone Encounter (Signed)
Per central scheduling they are not scheduling Friday for para's right now.   PARA scheduled for 945am tomorrow. Called pt and he is aware.

## 2022-05-29 NOTE — Telephone Encounter (Signed)
Tammy/Mindy:  Please arrange para, no more than 4 liters, Albumin 25 g IV at start. Cell count only. Thanks! For Wednesday or Friday at patient's request.

## 2022-05-30 ENCOUNTER — Ambulatory Visit (HOSPITAL_COMMUNITY)
Admission: RE | Admit: 2022-05-30 | Discharge: 2022-05-30 | Disposition: A | Discharge status: Home / Self Care | Payer: Medicare Other | Source: Ambulatory Visit | Attending: Gastroenterology | Admitting: Gastroenterology

## 2022-05-30 ENCOUNTER — Encounter (HOSPITAL_COMMUNITY): Payer: Self-pay

## 2022-05-30 DIAGNOSIS — R188 Other ascites: Secondary | ICD-10-CM | POA: Insufficient documentation

## 2022-05-30 DIAGNOSIS — K7460 Unspecified cirrhosis of liver: Secondary | ICD-10-CM | POA: Diagnosis not present

## 2022-05-30 LAB — BODY FLUID CELL COUNT WITH DIFFERENTIAL
Eos, Fluid: 0 %
Lymphs, Fluid: 42 %
Monocyte-Macrophage-Serous Fluid: 44 % — ABNORMAL LOW (ref 50–90)
Neutrophil Count, Fluid: 14 % (ref 0–25)
Total Nucleated Cell Count, Fluid: 228 cu mm (ref 0–1000)

## 2022-05-30 MED ORDER — ALBUMIN HUMAN 25 % IV SOLN
INTRAVENOUS | Status: AC
  Administered 2022-05-30: 25 g via INTRAVENOUS
  Filled 2022-05-30: qty 100

## 2022-05-30 MED ORDER — ALBUMIN HUMAN 25 % IV SOLN: 0.0000 g | Freq: Once | INTRAVENOUS | Status: AC

## 2022-05-30 NOTE — Procedures (Signed)
   US guided LLQ paracentesis  4 liters (max) yellow fluid Sent for labs Tolerated well  EBL: none

## 2022-05-30 NOTE — Progress Notes (Signed)
Paracentesis complete no signs of distress. 25G IV albumin given.

## 2022-06-14 DIAGNOSIS — I5022 Chronic systolic (congestive) heart failure: Secondary | ICD-10-CM | POA: Diagnosis not present

## 2022-06-14 DIAGNOSIS — Z941 Heart transplant status: Secondary | ICD-10-CM | POA: Diagnosis not present

## 2022-06-14 DIAGNOSIS — I10 Essential (primary) hypertension: Secondary | ICD-10-CM | POA: Diagnosis not present

## 2022-06-14 DIAGNOSIS — Z299 Encounter for prophylactic measures, unspecified: Secondary | ICD-10-CM | POA: Diagnosis not present

## 2022-06-18 DIAGNOSIS — Z7682 Awaiting organ transplant status: Secondary | ICD-10-CM | POA: Diagnosis not present

## 2022-06-18 DIAGNOSIS — Z008 Encounter for other general examination: Secondary | ICD-10-CM | POA: Diagnosis not present

## 2022-06-21 DIAGNOSIS — N1832 Chronic kidney disease, stage 3b: Secondary | ICD-10-CM | POA: Diagnosis not present

## 2022-06-29 DIAGNOSIS — Z0110 Encounter for examination of ears and hearing without abnormal findings: Secondary | ICD-10-CM | POA: Diagnosis not present

## 2022-07-13 DIAGNOSIS — I10 Essential (primary) hypertension: Secondary | ICD-10-CM | POA: Diagnosis not present

## 2022-07-13 DIAGNOSIS — E46 Unspecified protein-calorie malnutrition: Secondary | ICD-10-CM | POA: Diagnosis not present

## 2022-07-13 DIAGNOSIS — Z299 Encounter for prophylactic measures, unspecified: Secondary | ICD-10-CM | POA: Diagnosis not present

## 2022-07-13 DIAGNOSIS — R1310 Dysphagia, unspecified: Secondary | ICD-10-CM | POA: Diagnosis not present

## 2022-07-13 DIAGNOSIS — N62 Hypertrophy of breast: Secondary | ICD-10-CM | POA: Diagnosis not present

## 2022-07-13 DIAGNOSIS — Z681 Body mass index (BMI) 19 or less, adult: Secondary | ICD-10-CM | POA: Diagnosis not present

## 2022-07-26 DIAGNOSIS — I10 Essential (primary) hypertension: Secondary | ICD-10-CM | POA: Diagnosis not present

## 2022-08-01 DIAGNOSIS — K766 Portal hypertension: Secondary | ICD-10-CM | POA: Diagnosis not present

## 2022-08-01 DIAGNOSIS — N185 Chronic kidney disease, stage 5: Secondary | ICD-10-CM | POA: Diagnosis not present

## 2022-08-01 DIAGNOSIS — R188 Other ascites: Secondary | ICD-10-CM | POA: Diagnosis not present

## 2022-08-01 DIAGNOSIS — K7460 Unspecified cirrhosis of liver: Secondary | ICD-10-CM | POA: Diagnosis not present

## 2022-08-01 DIAGNOSIS — Z01818 Encounter for other preprocedural examination: Secondary | ICD-10-CM | POA: Diagnosis not present

## 2022-08-20 ENCOUNTER — Encounter (INDEPENDENT_AMBULATORY_CARE_PROVIDER_SITE_OTHER): Payer: Self-pay | Admitting: Gastroenterology

## 2022-08-20 ENCOUNTER — Ambulatory Visit (INDEPENDENT_AMBULATORY_CARE_PROVIDER_SITE_OTHER): Payer: Medicare Other | Admitting: Gastroenterology

## 2022-08-20 VITALS — BP 127/78 | HR 87 | Temp 97.5°F | Ht 71.0 in | Wt 136.5 lb

## 2022-08-20 DIAGNOSIS — K7290 Hepatic failure, unspecified without coma: Secondary | ICD-10-CM

## 2022-08-20 DIAGNOSIS — K7460 Unspecified cirrhosis of liver: Secondary | ICD-10-CM

## 2022-08-20 NOTE — Patient Instructions (Addendum)
-   Continue furosemide 20 mg qday , epleronone 50 mg qday - Reduce salt intake to <2 g per day - Can take Tylenol max of 2 g per day (650 mg q8h) for pain - Avoid NSAIDs for pain - Avoid eating raw oysters/shellfish - Increase protein shakes (Ensure or Boost) to 3 times a day, increase exercise - Proceed with scheduled CT liver - Abstain from alcohol use

## 2022-08-20 NOTE — Progress Notes (Unsigned)
Katrinka Blazing, M.D. Gastroenterology & Hepatology [facility] [address] [place], [place] [address]  Primary Care Physician: Kirstie Peri, MD [address] [place] [address]  I will communicate my assessment and recommendations to the referring MD via EMR.  Problems: Alcoholic and congestive hepatopathy cirrhosis Recurrent ascites requiring frequent paracentesis  History of Present Illness: Patrick Humphrey is a 57 y.o. male with past medical history of of nonischemic cardiomyopathy s/p AICD, LVAD (2017 complicated by RV failure, Afib/flutter) s/p heart transplant 2018, alcoholic and congestive hepatopathy cirrhosis with recurrent ascites requiring repeat paracentesis, CKD, who presents for follow up of liver cirrhosis.  The patient was last seen on 05/28/22. At that time, the patient was continued on furosemide 20 mg BID , spironolactone 50 mg qday,  carvedilol 6.25 mg twice a day.  Patient was seen by transplant pathology on 08/01/2022 at [facility].  Given improvement of his MELD score to 12, order liver transplant evaluation was deferred  The patient denies having any nausea, vomiting, fever, chills, hematochezia, melena, hematemesis, abdominal distention, abdominal pain, diarrhea, jaundice, pruritus .  He has had problem gaining weight despite taking protein shakes twice a day. He is using protein bars as well.   Cirrhosis related questions: Hematemesis/coffee ground emesis: No Abdominal pain: No Abdominal distention/worsening ascitesYes Fever/chills: No Episodes of confusion/disorientation: No Number of daily bowel movements:1-2 times a day Taking diuretics?: Yes, furosemide 20 mg qday , epleronone 50 mg qday (was on spironolactone but had gynecomastia) History of variceal bleeding: No Prior history of banding?: No Prior episodes of SBP: No Last time liver imaging was performed:***-  US doppler, adequate flow, no masses but presence  of moderate ascites Last AFP: 05/22/22 - 5.1 MELD 3.0 score: *** Consuming alcohol: No   Colonoscopy Nov 2023: rectal varices, one 6 mm polyp (tubular adenoma)    EGD nov 2023: gastritis s/p biopsy, portal gastropathy. Negative H.pylori.   Past Medical History: Past Medical History:  Diagnosis Date   Abdominal distension    AICD (automatic cardioverter/defibrillator) present    removed after heart transplant surgery   Ankle edema, bilateral    1-2 +   CHF (congestive heart failure) ([facility])    before heart transplant surgery   Cirrhosis ([facility])    Hx of echocardiogram 11/09/1910   Showed an EF of 25% with no significant valve disease.   ICD (implantable cardioverter-defibrillator), single, in situ 08/26/2007   removed after heart transplant surgery   Nonischemic cardiomyopathy [facility])    before heart transplant surgery    Past Surgical History: Past Surgical History:  Procedure Laterality Date   APPENDECTOMY     BIOPSY  02/22/2022   Procedure: BIOPSY;  Surgeon: Lanelle Bal, DO;  [facility];  Service: Gastroenterology;;   CARDIAC CATHETERIZATION  2002   Normal coronary arteries   CARDIAC CATHETERIZATION N/A 05/04/2015   Procedure: Right/Left Heart Cath and Coronary Angiography;  Surgeon: Laurey Morale, MD;  Location: [facility] INVASIVE CV LAB;  Service: Cardiovascular;  Laterality: N/A;   CARDIAC CATHETERIZATION N/A 05/08/2015   Procedure: Right Heart Cath;  Surgeon: Dolores Patty, MD;  Location: [facility] INVASIVE CV LAB;  Service: Cardiovascular;  Laterality: N/A;   CARDIAC CATHETERIZATION N/A 05/08/2015   Procedure: IABP Insertion;  Surgeon: Dolores Patty, MD;  Location: [facility] INVASIVE CV LAB;  Service: Cardiovascular;  Laterality: N/A;   CARDIOVERSION N/A 05/18/2015   Procedure: CARDIOVERSION WITH TEE;  Surgeon: Laurey Morale, MD;  Location: [facility]  OR;  Service: Cardiovascular;  Laterality: N/A;   CHOLECYSTECTOMY     COLONOSCOPY WITH PROPOFOL N/A 02/22/2022    Procedure: COLONOSCOPY WITH PROPOFOL;  Surgeon: Lanelle Bal, DO;  [facility];  Service: Gastroenterology;  Laterality: N/A;  105 ASA 3, pt knows to arrive at 11:00   ESOPHAGOGASTRODUODENOSCOPY (EGD) WITH PROPOFOL N/A 02/22/2022   Procedure: ESOPHAGOGASTRODUODENOSCOPY (EGD) WITH PROPOFOL;  Surgeon: Lanelle Bal, DO;  [facility];  Service: Gastroenterology;  Laterality: N/A;   HEART TRANSPLANT     INSERTION OF IMPLANTABLE LEFT VENTRICULAR ASSIST DEVICE N/A 05/10/2015   Procedure: INSERTION OF IMPLANTABLE LEFT VENTRICULAR ASSIST DEVICE;  Surgeon: Kerin Perna, MD;  Location: [facility] OR;  Service: Open Heart Surgery;  Laterality: N/A;  CIRC ARREST  NITRIC OXIDE   PACEMAKER INSERTION  08/26/2007   place by Dr. Clovis Riley at [facility]   POLYPECTOMY  02/22/2022   Procedure: POLYPECTOMY;  Surgeon: Lanelle Bal, DO;  [facility];  Service: Gastroenterology;;   removal of ICD     04/2016   TEE WITHOUT CARDIOVERSION N/A 05/10/2015   Procedure: TRANSESOPHAGEAL ECHOCARDIOGRAM (TEE);  Surgeon: Kerin Perna, MD;  Location: [facility] OR;  Service: Open Heart Surgery;  Laterality: N/A;    Family History: Family History  Problem Relation Age of Onset   Hypertension Mother    Hypertension Father    Cancer - Prostate Maternal Grandfather     Social History: Social History   Tobacco Use  Smoking Status Former   Packs/day: 1.00   Years: 5.00   Additional pack years: 0.00   Total pack years: 5.00   Types: Cigarettes   Quit date: 02/20/1998   Years since quitting: 24.5   Passive exposure: Current  Smokeless Tobacco Never   Social History   Substance and Sexual Activity  Alcohol Use Not Currently   Comment: has quit drinking x 1 1/2 months. He sporadically drinks   Social History   Substance and Sexual Activity  Drug Use No    Allergies: Allergies  Allergen Reactions   Penicillins     Per patient he tolerated a form of PCN as an  adult  Tolerates cephalexin  Has patient had a PCN reaction causing immediate rash, facial/tongue/throat swelling, SOB or lightheadedness with hypotension: Yes Has patient had a PCN reaction causing severe rash involving mucus membranes or skin necrosis: No Has patient had a PCN reaction that required hospitalization No Has patient had a PCN reaction occurring within the last 10 years: No If all of the above answers are "NO", then may proceed with Cep   Propranolol Other (See Comments)    Nightmares, Tired, Cold hand and feet, Insomnia, Fatigue, Diminished energy, Low Libido, anxiety, poor concentration and depression   Sulfa Antibiotics     GI upset    Medications: Current Outpatient Medications  Medication Sig Dispense Refill   ALPRAZolam (XANAX) 0.5 MG tablet Take 0.5 mg by mouth daily as needed for anxiety.     cetirizine (ZYRTEC) 10 MG tablet Take 10 mg by mouth daily.     eplerenone (INSPRA) 50 MG tablet Take 50 mg by mouth daily.     famotidine (PEPCID) 20 MG tablet Take 20 mg by mouth 2 (two) times daily.     fluticasone (FLONASE) 50 MCG/ACT nasal spray Place 2 sprays into both nostrils daily.      furosemide (LASIX) 20 MG tablet Take 20 mg by mouth daily.     mycophenolate (MYFORTIC) 360  MG TBEC EC tablet Take 360 mg by mouth 2 (two) times daily.     olopatadine (PATADAY) 0.1 % ophthalmic solution Place 1 drop into both eyes daily as needed (Red or itching eyes).     predniSONE (DELTASONE) 2.5 MG tablet Take 2.5 mg by mouth daily.     tacrolimus (PROGRAF) 1 MG capsule Take 1 mg by mouth 2 (two) times daily. Take 3 mg in the morning  and 2 mg at night     tamsulosin (FLOMAX) 0.4 MG CAPS capsule Take 0.4 mg by mouth daily.     No current facility-administered medications for this visit.    Review of Systems: GENERAL: negative for malaise, night sweats HEENT: No changes in hearing or vision, no nose bleeds or other nasal problems. NECK: Negative for lumps, goiter, pain and  significant neck swelling RESPIRATORY: Negative for cough, wheezing CARDIOVASCULAR: Negative for chest pain, leg swelling, palpitations, orthopnea GI: SEE HPI MUSCULOSKELETAL: Negative for joint pain or swelling, back pain, and muscle pain. SKIN: Negative for lesions, rash PSYCH: Negative for sleep disturbance, mood disorder and recent psychosocial stressors. HEMATOLOGY Negative for prolonged bleeding, bruising easily, and swollen nodes. ENDOCRINE: Negative for cold or heat intolerance, polyuria, polydipsia and goiter. NEURO: negative for tremor, gait imbalance, syncope and seizures. The remainder of the review of systems is noncontributory.   Physical Exam: BP 127/78 (BP Location: Left Arm, Patient Position: Sitting, Cuff Size: Normal)   Pulse 87   Temp (!) 97.5 F (36.4 C) (Temporal)   Ht 5\' 11"  (1.803 m)   Wt 136 lb 8 oz (61.9 kg)   BMI 19.04 kg/m  GENERAL: The patient is AO x3, in no acute distress. HEENT: Head is normocephalic and atraumatic. EOMI are intact. Mouth is well hydrated and without lesions. NECK: Supple. No masses LUNGS: Clear to auscultation. No presence of rhonchi/wheezing/rales. Adequate chest expansion HEART: RRR, normal s1 and s2. ABDOMEN: Soft, nontender, no guarding, no peritoneal signs, and nondistended. BS +. No masses. EXTREMITIES: Without any cyanosis, clubbing, rash, lesions or edema. NEUROLOGIC: AOx3, no focal motor deficit. SKIN: no jaundice, no rashes  Imaging/Labs: as above  I personally reviewed and interpreted the available labs, imaging and endoscopic files.  Impression and Plan: Patrick Humphrey is a 57 y.o. male coming for follow up of ***   All questions were answered.      Katrinka Blazing, MD Gastroenterology and Hepatology [facility] Gastroenterology

## 2022-08-30 DIAGNOSIS — I5022 Chronic systolic (congestive) heart failure: Secondary | ICD-10-CM | POA: Diagnosis not present

## 2022-08-30 DIAGNOSIS — Z0000 Encounter for general adult medical examination without abnormal findings: Secondary | ICD-10-CM | POA: Diagnosis not present

## 2022-08-30 DIAGNOSIS — Z7189 Other specified counseling: Secondary | ICD-10-CM | POA: Diagnosis not present

## 2022-08-30 DIAGNOSIS — Z299 Encounter for prophylactic measures, unspecified: Secondary | ICD-10-CM | POA: Diagnosis not present

## 2022-08-30 DIAGNOSIS — N184 Chronic kidney disease, stage 4 (severe): Secondary | ICD-10-CM | POA: Diagnosis not present

## 2022-08-30 DIAGNOSIS — I428 Other cardiomyopathies: Secondary | ICD-10-CM | POA: Diagnosis not present

## 2022-10-10 DIAGNOSIS — L570 Actinic keratosis: Secondary | ICD-10-CM | POA: Diagnosis not present

## 2022-10-10 DIAGNOSIS — D485 Neoplasm of uncertain behavior of skin: Secondary | ICD-10-CM | POA: Diagnosis not present

## 2022-10-22 DIAGNOSIS — S233XXA Sprain of ligaments of thoracic spine, initial encounter: Secondary | ICD-10-CM | POA: Diagnosis not present

## 2022-10-22 DIAGNOSIS — M47812 Spondylosis without myelopathy or radiculopathy, cervical region: Secondary | ICD-10-CM | POA: Diagnosis not present

## 2022-10-22 DIAGNOSIS — M9901 Segmental and somatic dysfunction of cervical region: Secondary | ICD-10-CM | POA: Diagnosis not present

## 2022-10-22 DIAGNOSIS — M9903 Segmental and somatic dysfunction of lumbar region: Secondary | ICD-10-CM | POA: Diagnosis not present

## 2022-10-22 DIAGNOSIS — S335XXA Sprain of ligaments of lumbar spine, initial encounter: Secondary | ICD-10-CM | POA: Diagnosis not present

## 2022-10-22 DIAGNOSIS — M9902 Segmental and somatic dysfunction of thoracic region: Secondary | ICD-10-CM | POA: Diagnosis not present

## 2022-10-25 DIAGNOSIS — M47812 Spondylosis without myelopathy or radiculopathy, cervical region: Secondary | ICD-10-CM | POA: Diagnosis not present

## 2022-10-25 DIAGNOSIS — M9903 Segmental and somatic dysfunction of lumbar region: Secondary | ICD-10-CM | POA: Diagnosis not present

## 2022-10-25 DIAGNOSIS — S335XXA Sprain of ligaments of lumbar spine, initial encounter: Secondary | ICD-10-CM | POA: Diagnosis not present

## 2022-10-25 DIAGNOSIS — S233XXA Sprain of ligaments of thoracic spine, initial encounter: Secondary | ICD-10-CM | POA: Diagnosis not present

## 2022-10-25 DIAGNOSIS — M9902 Segmental and somatic dysfunction of thoracic region: Secondary | ICD-10-CM | POA: Diagnosis not present

## 2022-10-25 DIAGNOSIS — M9901 Segmental and somatic dysfunction of cervical region: Secondary | ICD-10-CM | POA: Diagnosis not present

## 2022-10-30 ENCOUNTER — Telehealth: Payer: Self-pay | Admitting: *Deleted

## 2022-10-30 DIAGNOSIS — S233XXA Sprain of ligaments of thoracic spine, initial encounter: Secondary | ICD-10-CM | POA: Diagnosis not present

## 2022-10-30 DIAGNOSIS — M47812 Spondylosis without myelopathy or radiculopathy, cervical region: Secondary | ICD-10-CM | POA: Diagnosis not present

## 2022-10-30 DIAGNOSIS — S335XXA Sprain of ligaments of lumbar spine, initial encounter: Secondary | ICD-10-CM | POA: Diagnosis not present

## 2022-10-30 DIAGNOSIS — M9902 Segmental and somatic dysfunction of thoracic region: Secondary | ICD-10-CM | POA: Diagnosis not present

## 2022-10-30 DIAGNOSIS — M9901 Segmental and somatic dysfunction of cervical region: Secondary | ICD-10-CM | POA: Diagnosis not present

## 2022-10-30 DIAGNOSIS — M9903 Segmental and somatic dysfunction of lumbar region: Secondary | ICD-10-CM | POA: Diagnosis not present

## 2022-10-30 NOTE — Progress Notes (Signed)
  Care Coordination  Outreach Note  10/30/2022 Name: Patrick Humphrey MRN: [id number] DOB: Apr 16, 1965   Care Coordination Outreach Attempts: An unsuccessful telephone outreach was attempted today to offer the patient information about available care coordination services.  Follow Up Plan:  Additional outreach attempts will be made to offer the patient care coordination information and services.   Encounter Outcome:  No Answer  Christie Nottingham  Care Coordination Care Guide  Direct Dial: [phone]

## 2022-10-31 DIAGNOSIS — K7460 Unspecified cirrhosis of liver: Secondary | ICD-10-CM | POA: Diagnosis not present

## 2022-10-31 DIAGNOSIS — D696 Thrombocytopenia, unspecified: Secondary | ICD-10-CM | POA: Diagnosis not present

## 2022-10-31 DIAGNOSIS — Z79899 Other long term (current) drug therapy: Secondary | ICD-10-CM | POA: Diagnosis not present

## 2022-10-31 DIAGNOSIS — K3189 Other diseases of stomach and duodenum: Secondary | ICD-10-CM | POA: Diagnosis not present

## 2022-10-31 DIAGNOSIS — Z79621 Long term (current) use of calcineurin inhibitor: Secondary | ICD-10-CM | POA: Diagnosis not present

## 2022-10-31 DIAGNOSIS — I10 Essential (primary) hypertension: Secondary | ICD-10-CM | POA: Diagnosis not present

## 2022-10-31 DIAGNOSIS — R188 Other ascites: Secondary | ICD-10-CM | POA: Diagnosis not present

## 2022-10-31 DIAGNOSIS — D84821 Immunodeficiency due to drugs: Secondary | ICD-10-CM | POA: Diagnosis not present

## 2022-10-31 DIAGNOSIS — Z79624 Long term (current) use of inhibitors of nucleotide synthesis: Secondary | ICD-10-CM | POA: Diagnosis not present

## 2022-10-31 DIAGNOSIS — N184 Chronic kidney disease, stage 4 (severe): Secondary | ICD-10-CM | POA: Diagnosis not present

## 2022-10-31 DIAGNOSIS — Z941 Heart transplant status: Secondary | ICD-10-CM | POA: Diagnosis not present

## 2022-10-31 DIAGNOSIS — D849 Immunodeficiency, unspecified: Secondary | ICD-10-CM | POA: Diagnosis not present

## 2022-10-31 DIAGNOSIS — I340 Nonrheumatic mitral (valve) insufficiency: Secondary | ICD-10-CM | POA: Diagnosis not present

## 2022-10-31 DIAGNOSIS — K766 Portal hypertension: Secondary | ICD-10-CM | POA: Diagnosis not present

## 2022-10-31 DIAGNOSIS — I1310 Hypertensive heart and chronic kidney disease without heart failure, with stage 1 through stage 4 chronic kidney disease, or unspecified chronic kidney disease: Secondary | ICD-10-CM | POA: Diagnosis not present

## 2022-10-31 DIAGNOSIS — Z7952 Long term (current) use of systemic steroids: Secondary | ICD-10-CM | POA: Diagnosis not present

## 2022-10-31 DIAGNOSIS — Z4821 Encounter for aftercare following heart transplant: Secondary | ICD-10-CM | POA: Diagnosis not present

## 2022-11-05 NOTE — Progress Notes (Unsigned)
  Care Coordination  Outreach Note  11/05/2022 Name: CEEJAY MARRIN MRN: [id number] DOB: 1965/06/25   Care Coordination Outreach Attempts: A second unsuccessful outreach was attempted today to offer the patient with information about available care coordination services.  Follow Up Plan:  Additional outreach attempts will be made to offer the patient care coordination information and services.   Encounter Outcome:  No Answer  Christie Nottingham  Care Coordination Care Guide  Direct Dial: [phone]

## 2022-11-06 DIAGNOSIS — M9902 Segmental and somatic dysfunction of thoracic region: Secondary | ICD-10-CM | POA: Diagnosis not present

## 2022-11-06 DIAGNOSIS — M9903 Segmental and somatic dysfunction of lumbar region: Secondary | ICD-10-CM | POA: Diagnosis not present

## 2022-11-06 DIAGNOSIS — M47812 Spondylosis without myelopathy or radiculopathy, cervical region: Secondary | ICD-10-CM | POA: Diagnosis not present

## 2022-11-06 DIAGNOSIS — S335XXA Sprain of ligaments of lumbar spine, initial encounter: Secondary | ICD-10-CM | POA: Diagnosis not present

## 2022-11-06 DIAGNOSIS — S233XXA Sprain of ligaments of thoracic spine, initial encounter: Secondary | ICD-10-CM | POA: Diagnosis not present

## 2022-11-06 DIAGNOSIS — M9901 Segmental and somatic dysfunction of cervical region: Secondary | ICD-10-CM | POA: Diagnosis not present

## 2022-11-07 NOTE — Progress Notes (Signed)
  Care Coordination  Outreach Note  11/07/2022 Name: Patrick Humphrey MRN: [id number] DOB: 12/11/1965   Care Coordination Outreach Attempts: A third unsuccessful outreach was attempted today to offer the patient with information about available care coordination services.  Follow Up Plan:  No further outreach attempts will be made at this time. We have been unable to contact the patient to offer or enroll patient in care coordination services  Encounter Outcome:  No Answer  Christie Nottingham  Care Coordination Care Guide  Direct Dial: [phone]

## 2022-11-13 DIAGNOSIS — Z9225 Personal history of immunosupression therapy: Secondary | ICD-10-CM | POA: Diagnosis not present

## 2022-11-13 DIAGNOSIS — Z941 Heart transplant status: Secondary | ICD-10-CM | POA: Diagnosis not present

## 2022-11-20 DIAGNOSIS — S233XXA Sprain of ligaments of thoracic spine, initial encounter: Secondary | ICD-10-CM | POA: Diagnosis not present

## 2022-11-20 DIAGNOSIS — M47812 Spondylosis without myelopathy or radiculopathy, cervical region: Secondary | ICD-10-CM | POA: Diagnosis not present

## 2022-11-20 DIAGNOSIS — S335XXA Sprain of ligaments of lumbar spine, initial encounter: Secondary | ICD-10-CM | POA: Diagnosis not present

## 2022-11-20 DIAGNOSIS — M9902 Segmental and somatic dysfunction of thoracic region: Secondary | ICD-10-CM | POA: Diagnosis not present

## 2022-11-20 DIAGNOSIS — M9901 Segmental and somatic dysfunction of cervical region: Secondary | ICD-10-CM | POA: Diagnosis not present

## 2022-11-20 DIAGNOSIS — M9903 Segmental and somatic dysfunction of lumbar region: Secondary | ICD-10-CM | POA: Diagnosis not present

## 2022-11-21 DIAGNOSIS — L218 Other seborrheic dermatitis: Secondary | ICD-10-CM | POA: Diagnosis not present

## 2022-12-04 DIAGNOSIS — S233XXA Sprain of ligaments of thoracic spine, initial encounter: Secondary | ICD-10-CM | POA: Diagnosis not present

## 2022-12-04 DIAGNOSIS — M47812 Spondylosis without myelopathy or radiculopathy, cervical region: Secondary | ICD-10-CM | POA: Diagnosis not present

## 2022-12-04 DIAGNOSIS — M9903 Segmental and somatic dysfunction of lumbar region: Secondary | ICD-10-CM | POA: Diagnosis not present

## 2022-12-04 DIAGNOSIS — M9902 Segmental and somatic dysfunction of thoracic region: Secondary | ICD-10-CM | POA: Diagnosis not present

## 2022-12-04 DIAGNOSIS — M9901 Segmental and somatic dysfunction of cervical region: Secondary | ICD-10-CM | POA: Diagnosis not present

## 2022-12-04 DIAGNOSIS — S335XXA Sprain of ligaments of lumbar spine, initial encounter: Secondary | ICD-10-CM | POA: Diagnosis not present

## 2022-12-11 DIAGNOSIS — M9903 Segmental and somatic dysfunction of lumbar region: Secondary | ICD-10-CM | POA: Diagnosis not present

## 2022-12-11 DIAGNOSIS — S335XXA Sprain of ligaments of lumbar spine, initial encounter: Secondary | ICD-10-CM | POA: Diagnosis not present

## 2022-12-11 DIAGNOSIS — M9901 Segmental and somatic dysfunction of cervical region: Secondary | ICD-10-CM | POA: Diagnosis not present

## 2022-12-11 DIAGNOSIS — M9902 Segmental and somatic dysfunction of thoracic region: Secondary | ICD-10-CM | POA: Diagnosis not present

## 2022-12-11 DIAGNOSIS — S233XXA Sprain of ligaments of thoracic spine, initial encounter: Secondary | ICD-10-CM | POA: Diagnosis not present

## 2022-12-11 DIAGNOSIS — M47812 Spondylosis without myelopathy or radiculopathy, cervical region: Secondary | ICD-10-CM | POA: Diagnosis not present

## 2023-01-01 DIAGNOSIS — U071 COVID-19: Secondary | ICD-10-CM | POA: Diagnosis not present

## 2023-01-01 DIAGNOSIS — I428 Other cardiomyopathies: Secondary | ICD-10-CM | POA: Diagnosis not present

## 2023-01-14 DIAGNOSIS — Z9225 Personal history of immunosupression therapy: Secondary | ICD-10-CM | POA: Diagnosis not present

## 2023-01-14 DIAGNOSIS — Z941 Heart transplant status: Secondary | ICD-10-CM | POA: Diagnosis not present

## 2023-01-14 DIAGNOSIS — Z48298 Encounter for aftercare following other organ transplant: Secondary | ICD-10-CM | POA: Diagnosis not present

## 2023-01-15 DIAGNOSIS — I10 Essential (primary) hypertension: Secondary | ICD-10-CM | POA: Diagnosis not present

## 2023-01-15 DIAGNOSIS — Z0000 Encounter for general adult medical examination without abnormal findings: Secondary | ICD-10-CM | POA: Diagnosis not present

## 2023-01-15 DIAGNOSIS — Z299 Encounter for prophylactic measures, unspecified: Secondary | ICD-10-CM | POA: Diagnosis not present

## 2023-01-15 DIAGNOSIS — I5022 Chronic systolic (congestive) heart failure: Secondary | ICD-10-CM | POA: Diagnosis not present

## 2023-01-15 DIAGNOSIS — I428 Other cardiomyopathies: Secondary | ICD-10-CM | POA: Diagnosis not present

## 2023-01-15 DIAGNOSIS — I471 Supraventricular tachycardia, unspecified: Secondary | ICD-10-CM | POA: Diagnosis not present

## 2023-02-06 DIAGNOSIS — Z941 Heart transplant status: Secondary | ICD-10-CM | POA: Diagnosis not present

## 2023-02-06 DIAGNOSIS — Z79899 Other long term (current) drug therapy: Secondary | ICD-10-CM | POA: Diagnosis not present

## 2023-02-06 DIAGNOSIS — N183 Chronic kidney disease, stage 3 unspecified: Secondary | ICD-10-CM | POA: Diagnosis not present

## 2023-02-06 DIAGNOSIS — I120 Hypertensive chronic kidney disease with stage 5 chronic kidney disease or end stage renal disease: Secondary | ICD-10-CM | POA: Diagnosis not present

## 2023-02-06 DIAGNOSIS — Z87891 Personal history of nicotine dependence: Secondary | ICD-10-CM | POA: Diagnosis not present

## 2023-02-18 DIAGNOSIS — L570 Actinic keratosis: Secondary | ICD-10-CM | POA: Diagnosis not present

## 2023-02-18 DIAGNOSIS — L218 Other seborrheic dermatitis: Secondary | ICD-10-CM | POA: Diagnosis not present

## 2023-02-21 ENCOUNTER — Ambulatory Visit (INDEPENDENT_AMBULATORY_CARE_PROVIDER_SITE_OTHER): Payer: Medicare Other | Admitting: Gastroenterology

## 2023-03-01 DIAGNOSIS — L233 Allergic contact dermatitis due to drugs in contact with skin: Secondary | ICD-10-CM | POA: Diagnosis not present

## 2023-03-01 DIAGNOSIS — I10 Essential (primary) hypertension: Secondary | ICD-10-CM | POA: Diagnosis not present

## 2023-03-01 DIAGNOSIS — Z299 Encounter for prophylactic measures, unspecified: Secondary | ICD-10-CM | POA: Diagnosis not present

## 2023-03-01 DIAGNOSIS — I471 Supraventricular tachycardia, unspecified: Secondary | ICD-10-CM | POA: Diagnosis not present

## 2023-03-01 DIAGNOSIS — I5022 Chronic systolic (congestive) heart failure: Secondary | ICD-10-CM | POA: Diagnosis not present

## 2023-04-12 DIAGNOSIS — D84821 Immunodeficiency due to drugs: Secondary | ICD-10-CM | POA: Diagnosis not present

## 2023-04-12 DIAGNOSIS — J069 Acute upper respiratory infection, unspecified: Secondary | ICD-10-CM | POA: Diagnosis not present

## 2023-04-12 DIAGNOSIS — I471 Supraventricular tachycardia, unspecified: Secondary | ICD-10-CM | POA: Diagnosis not present

## 2023-04-12 DIAGNOSIS — N184 Chronic kidney disease, stage 4 (severe): Secondary | ICD-10-CM | POA: Diagnosis not present

## 2023-04-12 DIAGNOSIS — I5022 Chronic systolic (congestive) heart failure: Secondary | ICD-10-CM | POA: Diagnosis not present

## 2023-05-08 DIAGNOSIS — Z48298 Encounter for aftercare following other organ transplant: Secondary | ICD-10-CM | POA: Diagnosis not present

## 2023-05-08 DIAGNOSIS — Z9225 Personal history of immunosupression therapy: Secondary | ICD-10-CM | POA: Diagnosis not present

## 2023-06-07 DIAGNOSIS — K7460 Unspecified cirrhosis of liver: Secondary | ICD-10-CM | POA: Diagnosis not present

## 2023-06-07 DIAGNOSIS — K3189 Other diseases of stomach and duodenum: Secondary | ICD-10-CM | POA: Diagnosis not present

## 2023-06-07 DIAGNOSIS — Z7952 Long term (current) use of systemic steroids: Secondary | ICD-10-CM | POA: Diagnosis not present

## 2023-06-07 DIAGNOSIS — N184 Chronic kidney disease, stage 4 (severe): Secondary | ICD-10-CM | POA: Diagnosis not present

## 2023-06-07 DIAGNOSIS — Z2989 Encounter for other specified prophylactic measures: Secondary | ICD-10-CM | POA: Diagnosis not present

## 2023-06-07 DIAGNOSIS — Z79899 Other long term (current) drug therapy: Secondary | ICD-10-CM | POA: Diagnosis not present

## 2023-06-07 DIAGNOSIS — I129 Hypertensive chronic kidney disease with stage 1 through stage 4 chronic kidney disease, or unspecified chronic kidney disease: Secondary | ICD-10-CM | POA: Diagnosis not present

## 2023-06-07 DIAGNOSIS — Z941 Heart transplant status: Secondary | ICD-10-CM | POA: Diagnosis not present

## 2023-06-07 DIAGNOSIS — D849 Immunodeficiency, unspecified: Secondary | ICD-10-CM | POA: Diagnosis not present

## 2023-06-07 DIAGNOSIS — D84821 Immunodeficiency due to drugs: Secondary | ICD-10-CM | POA: Diagnosis not present

## 2023-06-07 DIAGNOSIS — K766 Portal hypertension: Secondary | ICD-10-CM | POA: Diagnosis not present

## 2023-06-07 DIAGNOSIS — Z4821 Encounter for aftercare following heart transplant: Secondary | ICD-10-CM | POA: Diagnosis not present

## 2023-06-07 DIAGNOSIS — I071 Rheumatic tricuspid insufficiency: Secondary | ICD-10-CM | POA: Diagnosis not present

## 2023-06-07 DIAGNOSIS — Z79621 Long term (current) use of calcineurin inhibitor: Secondary | ICD-10-CM | POA: Diagnosis not present

## 2023-06-07 DIAGNOSIS — I4510 Unspecified right bundle-branch block: Secondary | ICD-10-CM | POA: Diagnosis not present

## 2023-06-07 DIAGNOSIS — I371 Nonrheumatic pulmonary valve insufficiency: Secondary | ICD-10-CM | POA: Diagnosis not present

## 2023-06-07 DIAGNOSIS — Z48298 Encounter for aftercare following other organ transplant: Secondary | ICD-10-CM | POA: Diagnosis not present

## 2023-06-07 DIAGNOSIS — Z5181 Encounter for therapeutic drug level monitoring: Secondary | ICD-10-CM | POA: Diagnosis not present

## 2023-06-07 DIAGNOSIS — R9431 Abnormal electrocardiogram [ECG] [EKG]: Secondary | ICD-10-CM | POA: Diagnosis not present

## 2023-06-07 DIAGNOSIS — I10 Essential (primary) hypertension: Secondary | ICD-10-CM | POA: Diagnosis not present

## 2023-06-11 DIAGNOSIS — L570 Actinic keratosis: Secondary | ICD-10-CM | POA: Diagnosis not present

## 2023-06-11 DIAGNOSIS — L218 Other seborrheic dermatitis: Secondary | ICD-10-CM | POA: Diagnosis not present

## 2023-06-11 DIAGNOSIS — D485 Neoplasm of uncertain behavior of skin: Secondary | ICD-10-CM | POA: Diagnosis not present

## 2023-06-11 DIAGNOSIS — L821 Other seborrheic keratosis: Secondary | ICD-10-CM | POA: Diagnosis not present

## 2023-08-22 DIAGNOSIS — H43393 Other vitreous opacities, bilateral: Secondary | ICD-10-CM | POA: Diagnosis not present

## 2023-08-22 DIAGNOSIS — H40033 Anatomical narrow angle, bilateral: Secondary | ICD-10-CM | POA: Diagnosis not present

## 2023-09-06 DIAGNOSIS — Z7189 Other specified counseling: Secondary | ICD-10-CM | POA: Diagnosis not present

## 2023-09-06 DIAGNOSIS — R5383 Other fatigue: Secondary | ICD-10-CM | POA: Diagnosis not present

## 2023-09-06 DIAGNOSIS — I10 Essential (primary) hypertension: Secondary | ICD-10-CM | POA: Diagnosis not present

## 2023-09-06 DIAGNOSIS — Z0000 Encounter for general adult medical examination without abnormal findings: Secondary | ICD-10-CM | POA: Diagnosis not present

## 2023-09-06 DIAGNOSIS — Z299 Encounter for prophylactic measures, unspecified: Secondary | ICD-10-CM | POA: Diagnosis not present

## 2023-09-06 DIAGNOSIS — Z79899 Other long term (current) drug therapy: Secondary | ICD-10-CM | POA: Diagnosis not present

## 2023-09-06 DIAGNOSIS — Z1389 Encounter for screening for other disorder: Secondary | ICD-10-CM | POA: Diagnosis not present

## 2023-09-06 DIAGNOSIS — E7800 Pure hypercholesterolemia, unspecified: Secondary | ICD-10-CM | POA: Diagnosis not present

## 2023-11-19 DIAGNOSIS — K766 Portal hypertension: Secondary | ICD-10-CM | POA: Diagnosis not present

## 2023-12-10 DIAGNOSIS — L218 Other seborrheic dermatitis: Secondary | ICD-10-CM | POA: Diagnosis not present

## 2023-12-10 DIAGNOSIS — L570 Actinic keratosis: Secondary | ICD-10-CM | POA: Diagnosis not present

## 2023-12-20 DIAGNOSIS — Z0000 Encounter for general adult medical examination without abnormal findings: Secondary | ICD-10-CM | POA: Diagnosis not present

## 2023-12-20 DIAGNOSIS — Z299 Encounter for prophylactic measures, unspecified: Secondary | ICD-10-CM | POA: Diagnosis not present

## 2023-12-20 DIAGNOSIS — I10 Essential (primary) hypertension: Secondary | ICD-10-CM | POA: Diagnosis not present

## 2023-12-20 DIAGNOSIS — I480 Paroxysmal atrial fibrillation: Secondary | ICD-10-CM | POA: Diagnosis not present

## 2023-12-20 DIAGNOSIS — N184 Chronic kidney disease, stage 4 (severe): Secondary | ICD-10-CM | POA: Diagnosis not present

## 2023-12-20 DIAGNOSIS — Z941 Heart transplant status: Secondary | ICD-10-CM | POA: Diagnosis not present

## 2023-12-20 DIAGNOSIS — I5022 Chronic systolic (congestive) heart failure: Secondary | ICD-10-CM | POA: Diagnosis not present

## 2023-12-20 DIAGNOSIS — R5383 Other fatigue: Secondary | ICD-10-CM | POA: Diagnosis not present

## 2023-12-27 DIAGNOSIS — N184 Chronic kidney disease, stage 4 (severe): Secondary | ICD-10-CM | POA: Diagnosis not present

## 2023-12-27 DIAGNOSIS — D849 Immunodeficiency, unspecified: Secondary | ICD-10-CM | POA: Diagnosis not present

## 2023-12-27 DIAGNOSIS — Z129 Encounter for screening for malignant neoplasm, site unspecified: Secondary | ICD-10-CM | POA: Diagnosis not present

## 2023-12-27 DIAGNOSIS — I129 Hypertensive chronic kidney disease with stage 1 through stage 4 chronic kidney disease, or unspecified chronic kidney disease: Secondary | ICD-10-CM | POA: Diagnosis not present

## 2023-12-27 DIAGNOSIS — Z941 Heart transplant status: Secondary | ICD-10-CM | POA: Diagnosis not present

## 2023-12-27 DIAGNOSIS — D84821 Immunodeficiency due to drugs: Secondary | ICD-10-CM | POA: Diagnosis not present

## 2023-12-27 DIAGNOSIS — K766 Portal hypertension: Secondary | ICD-10-CM | POA: Diagnosis not present

## 2023-12-27 DIAGNOSIS — K7460 Unspecified cirrhosis of liver: Secondary | ICD-10-CM | POA: Diagnosis not present

## 2023-12-27 DIAGNOSIS — Z79621 Long term (current) use of calcineurin inhibitor: Secondary | ICD-10-CM | POA: Diagnosis not present

## 2023-12-27 DIAGNOSIS — Z79899 Other long term (current) drug therapy: Secondary | ICD-10-CM | POA: Diagnosis not present

## 2023-12-27 DIAGNOSIS — R188 Other ascites: Secondary | ICD-10-CM | POA: Diagnosis not present

## 2023-12-27 DIAGNOSIS — Z48298 Encounter for aftercare following other organ transplant: Secondary | ICD-10-CM | POA: Diagnosis not present

## 2023-12-27 DIAGNOSIS — Z7952 Long term (current) use of systemic steroids: Secondary | ICD-10-CM | POA: Diagnosis not present

## 2023-12-27 DIAGNOSIS — I10 Essential (primary) hypertension: Secondary | ICD-10-CM | POA: Diagnosis not present

## 2023-12-27 DIAGNOSIS — Z79624 Long term (current) use of inhibitors of nucleotide synthesis: Secondary | ICD-10-CM | POA: Diagnosis not present

## 2023-12-27 DIAGNOSIS — Z4821 Encounter for aftercare following heart transplant: Secondary | ICD-10-CM | POA: Diagnosis not present

## 2024-01-22 ENCOUNTER — Encounter (INDEPENDENT_AMBULATORY_CARE_PROVIDER_SITE_OTHER): Payer: Self-pay | Admitting: Gastroenterology
